# Patient Record
Sex: Male | Born: 1935 | Race: Black or African American | Hispanic: No | Smoking: Former smoker
Health system: Southern US, Community
[De-identification: ages and names within clinical notes are randomized; demographics above are authoritative.]

## PROBLEM LIST (undated history)

## (undated) DIAGNOSIS — M1990 Unspecified osteoarthritis, unspecified site: Secondary | ICD-10-CM

## (undated) DIAGNOSIS — F0390 Unspecified dementia without behavioral disturbance: Secondary | ICD-10-CM

## (undated) DIAGNOSIS — N189 Chronic kidney disease, unspecified: Secondary | ICD-10-CM

## (undated) DIAGNOSIS — R569 Unspecified convulsions: Secondary | ICD-10-CM

## (undated) DIAGNOSIS — E119 Type 2 diabetes mellitus without complications: Secondary | ICD-10-CM

## (undated) DIAGNOSIS — I10 Essential (primary) hypertension: Secondary | ICD-10-CM

## (undated) DIAGNOSIS — E7800 Pure hypercholesterolemia, unspecified: Secondary | ICD-10-CM

## (undated) DIAGNOSIS — I639 Cerebral infarction, unspecified: Secondary | ICD-10-CM

## (undated) DIAGNOSIS — I480 Paroxysmal atrial fibrillation: Secondary | ICD-10-CM

## (undated) HISTORY — PX: CORONARY ARTERY BYPASS GRAFT: SHX141

## (undated) HISTORY — PX: CATARACT EXTRACTION: SUR2

## (undated) HISTORY — PX: TONSILLECTOMY: SUR1361

---

## 2014-08-18 ENCOUNTER — Emergency Department (HOSPITAL_COMMUNITY): Payer: Medicare PPO

## 2014-08-18 ENCOUNTER — Inpatient Hospital Stay (HOSPITAL_COMMUNITY): Payer: Medicare PPO

## 2014-08-18 ENCOUNTER — Observation Stay (HOSPITAL_COMMUNITY)
Admission: EM | Admit: 2014-08-18 | Discharge: 2014-08-20 | Disposition: A | Discharge status: Nursing Home (Includes ICF) | Payer: Medicare PPO | Attending: Internal Medicine | Admitting: Internal Medicine

## 2014-08-18 ENCOUNTER — Encounter (HOSPITAL_COMMUNITY): Payer: Self-pay | Admitting: Emergency Medicine

## 2014-08-18 DIAGNOSIS — R739 Hyperglycemia, unspecified: Secondary | ICD-10-CM

## 2014-08-18 DIAGNOSIS — I129 Hypertensive chronic kidney disease with stage 1 through stage 4 chronic kidney disease, or unspecified chronic kidney disease: Secondary | ICD-10-CM | POA: Diagnosis not present

## 2014-08-18 DIAGNOSIS — J019 Acute sinusitis, unspecified: Secondary | ICD-10-CM | POA: Insufficient documentation

## 2014-08-18 DIAGNOSIS — R4781 Slurred speech: Secondary | ICD-10-CM | POA: Insufficient documentation

## 2014-08-18 DIAGNOSIS — F0390 Unspecified dementia without behavioral disturbance: Secondary | ICD-10-CM | POA: Diagnosis not present

## 2014-08-18 DIAGNOSIS — I10 Essential (primary) hypertension: Secondary | ICD-10-CM | POA: Diagnosis present

## 2014-08-18 DIAGNOSIS — Z794 Long term (current) use of insulin: Secondary | ICD-10-CM | POA: Diagnosis not present

## 2014-08-18 DIAGNOSIS — I2510 Atherosclerotic heart disease of native coronary artery without angina pectoris: Secondary | ICD-10-CM | POA: Diagnosis not present

## 2014-08-18 DIAGNOSIS — Z951 Presence of aortocoronary bypass graft: Secondary | ICD-10-CM | POA: Diagnosis not present

## 2014-08-18 DIAGNOSIS — E118 Type 2 diabetes mellitus with unspecified complications: Secondary | ICD-10-CM

## 2014-08-18 DIAGNOSIS — R4182 Altered mental status, unspecified: Secondary | ICD-10-CM | POA: Diagnosis present

## 2014-08-18 DIAGNOSIS — E785 Hyperlipidemia, unspecified: Secondary | ICD-10-CM | POA: Diagnosis present

## 2014-08-18 DIAGNOSIS — N183 Chronic kidney disease, stage 3 unspecified: Secondary | ICD-10-CM | POA: Diagnosis present

## 2014-08-18 DIAGNOSIS — F0150 Vascular dementia without behavioral disturbance: Secondary | ICD-10-CM | POA: Diagnosis present

## 2014-08-18 DIAGNOSIS — IMO0002 Reserved for concepts with insufficient information to code with codable children: Secondary | ICD-10-CM

## 2014-08-18 DIAGNOSIS — Z7982 Long term (current) use of aspirin: Secondary | ICD-10-CM | POA: Insufficient documentation

## 2014-08-18 DIAGNOSIS — E1165 Type 2 diabetes mellitus with hyperglycemia: Secondary | ICD-10-CM | POA: Diagnosis not present

## 2014-08-18 DIAGNOSIS — R55 Syncope and collapse: Secondary | ICD-10-CM | POA: Diagnosis not present

## 2014-08-18 DIAGNOSIS — G9340 Encephalopathy, unspecified: Secondary | ICD-10-CM

## 2014-08-18 HISTORY — DX: Unspecified dementia, unspecified severity, without behavioral disturbance, psychotic disturbance, mood disturbance, and anxiety: F03.90

## 2014-08-18 HISTORY — DX: Unspecified osteoarthritis, unspecified site: M19.90

## 2014-08-18 HISTORY — DX: Pure hypercholesterolemia, unspecified: E78.00

## 2014-08-18 HISTORY — DX: Type 2 diabetes mellitus without complications: E11.9

## 2014-08-18 HISTORY — DX: Chronic kidney disease, unspecified: N18.9

## 2014-08-18 HISTORY — DX: Essential (primary) hypertension: I10

## 2014-08-18 LAB — CBC WITH DIFFERENTIAL/PLATELET
Basophils Absolute: 0 10*3/uL (ref 0.0–0.1)
Basophils Relative: 0 % (ref 0–1)
EOS ABS: 0.1 10*3/uL (ref 0.0–0.7)
Eosinophils Relative: 1 % (ref 0–5)
HCT: 34.5 % — ABNORMAL LOW (ref 39.0–52.0)
HEMOGLOBIN: 11.8 g/dL — AB (ref 13.0–17.0)
Lymphocytes Relative: 15 % (ref 12–46)
Lymphs Abs: 0.7 10*3/uL (ref 0.7–4.0)
MCH: 30.7 pg (ref 26.0–34.0)
MCHC: 34.2 g/dL (ref 30.0–36.0)
MCV: 89.8 fL (ref 78.0–100.0)
MONO ABS: 0.3 10*3/uL (ref 0.1–1.0)
MONOS PCT: 6 % (ref 3–12)
NEUTROS ABS: 3.8 10*3/uL (ref 1.7–7.7)
NEUTROS PCT: 78 % — AB (ref 43–77)
Platelets: 208 10*3/uL (ref 150–400)
RBC: 3.84 MIL/uL — ABNORMAL LOW (ref 4.22–5.81)
RDW: 13.1 % (ref 11.5–15.5)
WBC: 4.9 10*3/uL (ref 4.0–10.5)

## 2014-08-18 LAB — GLUCOSE, CAPILLARY
Glucose-Capillary: 242 mg/dL — ABNORMAL HIGH (ref 70–99)
Glucose-Capillary: 343 mg/dL — ABNORMAL HIGH (ref 70–99)

## 2014-08-18 LAB — URINALYSIS, ROUTINE W REFLEX MICROSCOPIC
Bilirubin Urine: NEGATIVE
Glucose, UA: >1000 mg/dL — AB
Hgb urine dipstick: NEGATIVE
Ketones, ur: NEGATIVE mg/dL
LEUKOCYTES UA: NEGATIVE
NITRITE: NEGATIVE
PROTEIN: NEGATIVE mg/dL
SPECIFIC GRAVITY, URINE: 1.022 (ref 1.005–1.030)
UROBILINOGEN UA: 0.2 mg/dL (ref 0.0–1.0)
pH: 7 (ref 5.0–8.0)

## 2014-08-18 LAB — CBG MONITORING, ED
GLUCOSE-CAPILLARY: 331 mg/dL — AB (ref 70–99)
GLUCOSE-CAPILLARY: 302 mg/dL — AB (ref 70–99)
Glucose-Capillary: 291 mg/dL — ABNORMAL HIGH (ref 70–99)

## 2014-08-18 LAB — COMPREHENSIVE METABOLIC PANEL
ALT: 21 U/L (ref 0–53)
ANION GAP: 13 (ref 5–15)
AST: 19 U/L (ref 0–37)
Albumin: 3.6 g/dL (ref 3.5–5.2)
Alkaline Phosphatase: 63 U/L (ref 39–117)
BUN: 24 mg/dL — AB (ref 6–23)
CHLORIDE: 95 meq/L — AB (ref 96–112)
CO2: 27 mEq/L (ref 19–32)
CREATININE: 1.52 mg/dL — AB (ref 0.50–1.35)
Calcium: 9.5 mg/dL (ref 8.4–10.5)
GFR calc Af Amer: 48 mL/min — ABNORMAL LOW (ref >90)
GFR calc non Af Amer: 42 mL/min — ABNORMAL LOW (ref >90)
Glucose, Bld: 476 mg/dL — ABNORMAL HIGH (ref 70–99)
Potassium: 4.6 mEq/L (ref 3.7–5.3)
Sodium: 135 mEq/L — ABNORMAL LOW (ref 137–147)
TOTAL PROTEIN: 7.3 g/dL (ref 6.0–8.3)
Total Bilirubin: 0.3 mg/dL (ref 0.3–1.2)

## 2014-08-18 LAB — URINE MICROSCOPIC-ADD ON

## 2014-08-18 LAB — TROPONIN I: Troponin I: <0.3 ng/mL (ref <0.30)

## 2014-08-18 LAB — I-STAT TROPONIN, ED: TROPONIN I, POC: 0.01 ng/mL (ref 0.00–0.08)

## 2014-08-18 MED ORDER — ASPIRIN EC 325 MG PO TBEC
325.0000 mg | DELAYED_RELEASE_TABLET | Freq: Every day | ORAL | Status: DC
  Administered 2014-08-19 – 2014-08-20 (×2): 325 mg via ORAL
  Filled 2014-08-18 (×2): qty 1

## 2014-08-18 MED ORDER — HEPARIN SODIUM (PORCINE) 5000 UNIT/ML IJ SOLN
5000.0000 [IU] | Freq: Three times a day (TID) | INTRAMUSCULAR | Status: DC
  Administered 2014-08-18 – 2014-08-20 (×6): 5000 [IU] via SUBCUTANEOUS
  Filled 2014-08-18 (×6): qty 1

## 2014-08-18 MED ORDER — SODIUM CHLORIDE 0.9 % IV BOLUS (SEPSIS)
1000.0000 mL | Freq: Once | INTRAVENOUS | Status: AC
  Administered 2014-08-18: 1000 mL via INTRAVENOUS

## 2014-08-18 MED ORDER — ONDANSETRON HCL 4 MG PO TABS: 4.0000 mg | ORAL_TABLET | Freq: Four times a day (QID) | ORAL | Status: DC | PRN

## 2014-08-18 MED ORDER — DOCUSATE SODIUM 100 MG PO CAPS
100.0000 mg | ORAL_CAPSULE | Freq: Two times a day (BID) | ORAL | Status: DC
  Administered 2014-08-18 – 2014-08-20 (×4): 100 mg via ORAL
  Filled 2014-08-18 (×4): qty 1

## 2014-08-18 MED ORDER — SODIUM CHLORIDE 0.9 % IJ SOLN
3.0000 mL | Freq: Two times a day (BID) | INTRAMUSCULAR | Status: DC
  Administered 2014-08-18 – 2014-08-19 (×2): 3 mL via INTRAVENOUS

## 2014-08-18 MED ORDER — ACETAMINOPHEN 325 MG PO TABS: 650.0000 mg | ORAL_TABLET | Freq: Four times a day (QID) | ORAL | Status: DC | PRN

## 2014-08-18 MED ORDER — INSULIN ASPART 100 UNIT/ML ~~LOC~~ SOLN
0.0000 [IU] | Freq: Three times a day (TID) | SUBCUTANEOUS | Status: DC
  Administered 2014-08-19 (×2): 7 [IU] via SUBCUTANEOUS
  Administered 2014-08-19 – 2014-08-20 (×2): 5 [IU] via SUBCUTANEOUS
  Administered 2014-08-20 (×2): 3 [IU] via SUBCUTANEOUS

## 2014-08-18 MED ORDER — AMOXICILLIN-POT CLAVULANATE 500-125 MG PO TABS
1.0000 | ORAL_TABLET | Freq: Three times a day (TID) | ORAL | Status: DC
  Administered 2014-08-18 – 2014-08-20 (×6): 500 mg via ORAL
  Filled 2014-08-18 (×7): qty 1

## 2014-08-18 MED ORDER — ATORVASTATIN CALCIUM 10 MG PO TABS
10.0000 mg | ORAL_TABLET | Freq: Every day | ORAL | Status: DC
Start: 2014-08-18 — End: 2014-08-20
  Administered 2014-08-18 – 2014-08-20 (×3): 10 mg via ORAL
  Filled 2014-08-18 (×3): qty 1

## 2014-08-18 MED ORDER — TRAZODONE HCL 50 MG PO TABS: 25.0000 mg | ORAL_TABLET | Freq: Every evening | ORAL | Status: DC | PRN

## 2014-08-18 MED ORDER — SODIUM CHLORIDE 0.9 % IV SOLN
INTRAVENOUS | Status: DC
  Administered 2014-08-18: 2.7 [IU]/h via INTRAVENOUS
  Filled 2014-08-18: qty 2.5

## 2014-08-18 MED ORDER — ONDANSETRON HCL 4 MG/2ML IJ SOLN
4.0000 mg | Freq: Four times a day (QID) | INTRAMUSCULAR | Status: DC | PRN
Start: 2014-08-18 — End: 2014-08-20

## 2014-08-18 MED ORDER — ACETAMINOPHEN 650 MG RE SUPP: 650.0000 mg | Freq: Four times a day (QID) | RECTAL | Status: DC | PRN

## 2014-08-18 MED ORDER — SODIUM CHLORIDE 0.9 % IV SOLN: INTRAVENOUS | Status: DC

## 2014-08-18 NOTE — Consult Note (Signed)
NEURO HOSPITALIST CONSULT NOTE    Reason for Consult: AMS after presyncopal peroid  HPI:                                                                                                                                          Nosson Wender is an 80 y.o. male  who resides at a nursing facility.  History of events this AM were obtained from chart as patient cannot recall what happened. Per nursing staff at SNF " she states that patient was sitting on his chair, started sluring speech, then drooling on himself and then began going in and out of consciousness - this lasted 15-20 minutes until EMS came, he was still not back to his baseline. States at baseline he walks and talks, is usually oriented x 3. Was started on seroquel in in the past two weeks, no other medication changes. States his behavior was changed today, he was giddy and was walking with his walker backwards prior to the event - states this is unlike him. "  Currently he is alert and oriented to place but not year or month.  He recalls going to get breakfast but cannot recall events after.  He is able to follow all commands.  He has urinated on himself but clearly states he called out to the nurse stating he needed to urinate but no one came to help.    Past Medical History  Diagnosis Date  . Dementia   . Elevated cholesterol   . Hypertension   . Diabetes mellitus without complication     Past Surgical History  Procedure Laterality Date  . Cardiac surgery      Family History: Unable to obatin  Social History:  reports that he has never smoked. He does not have any smokeless tobacco history on file. He reports that he does not drink alcohol. His drug history is not on file.  No Known Allergies  MEDICATIONS:                                                                                                                     Current Facility-Administered Medications  Medication Dose Route Frequency  Provider Last Rate Last Dose  . insulin regular (NOVOLIN R,HUMULIN R) 250 Units in sodium chloride 0.9 %  250 mL (1 Units/mL) infusion   Intravenous Continuous Trixie DredgeEmily West, PA-C 2.7 mL/hr at 08/18/14 1454 2.7 Units/hr at 08/18/14 1454   Current Outpatient Prescriptions  Medication Sig Dispense Refill  . amLODipine (NORVASC) 10 MG tablet Take 10 mg by mouth daily.      [name]. aspirin EC 325 MG tablet Take 325 mg by mouth daily.      [name]. atorvastatin (LIPITOR) 10 MG tablet Take 10 mg by mouth daily.      . Cholecalciferol (VITAMIN D-3) 1000 UNITS CAPS Take 1,000 Units by mouth daily.      [name]. glimepiride (AMARYL) 2 MG tablet Take 2 mg by mouth 2 (two) times daily.      . hydrochlorothiazide (HYDRODIURIL) 25 MG tablet Take 25 mg by mouth daily.      [name]. lisinopril (PRINIVIL,ZESTRIL) 10 MG tablet Take 10 mg by mouth daily.      . pioglitazone (ACTOS) 30 MG tablet Take 30 mg by mouth daily.      . QUEtiapine (SEROQUEL) 50 MG tablet Take 50 mg by mouth 2 (two) times daily.      . sitaGLIPtin (JANUVIA) 50 MG tablet Take 50 mg by mouth daily.          ROS:                                                                                                                                       History obtained from unobtainable from patient due to mental status   Blood pressure 144/68, pulse 73, temperature 97.6 F (36.4 C), temperature source Oral, resp. rate 15, SpO2 100.00%.   Neurologic Examination:                                                                                                       General: NAD Mental Status: Alert, oriented to hospital but not year or month.  Amnestic of events that occurred this AM.  Speech now fluent without evidence of aphasia.  Able to follow 3 step commands without difficulty. Cranial Nerves: II: Discs flat bilaterally; Visual fields grossly normal, pupils equal, round, reactive to light and accommodation III,IV, VI: ptosis not present, extra-ocular motions intact  bilaterally V,VII: smile symmetric, facial light touch sensation normal bilaterally VIII: hearing normal bilaterally IX,X: gag reflex present XI: bilateral shoulder shrug XII: midline tongue extension without atrophy or fasciculations  Motor: Right : Upper extremity   5/5    Left:     Upper extremity  5/5  Lower extremity   5/5     Lower extremity   5/5 Tone and bulk:normal tone throughout; no atrophy noted Sensory: Pinprick and light touch intact throughout, bilaterally Deep Tendon Reflexes:  Right: Upper Extremity   Left: Upper extremity   biceps (C-5 to C-6) 2/4   biceps (C-5 to C-6) 2/4 tricep (C7) 2/4    triceps (C7) 2/4 Brachioradialis (C6) 2/4  Brachioradialis (C6) 2/4  Lower Extremity Lower Extremity  quadriceps (L-2 to L-4) 2/4   quadriceps (L-2 to L-4) 2/4 Achilles (S1) 2/4   Achilles (S1) 2/4  Plantars: Right: downgoing   Left: downgoing Cerebellar: normal finger-to-nose,  normal heel-to-shin test Gait: not tested due to multiple leads.  CV: pulses palpable throughout   Lab Results: Basic Metabolic Panel:  Recent Labs Lab 08/18/14 1152  NA 135*  K 4.6  CL 95*  CO2 27  GLUCOSE 476*  BUN 24*  CREATININE 1.52*  CALCIUM 9.5    Liver Function Tests:  Recent Labs Lab 08/18/14 1152  AST 19  ALT 21  ALKPHOS 63  BILITOT 0.3  PROT 7.3  ALBUMIN 3.6   No results found for this basename: LIPASE, AMYLASE,  in the last 168 hours No results found for this basename: AMMONIA,  in the last 168 hours  CBC:  Recent Labs Lab 08/18/14 1152  WBC 4.9  NEUTROABS 3.8  HGB 11.8*  HCT 34.5*  MCV 89.8  PLT 208    Cardiac Enzymes: No results found for this basename: CKTOTAL, CKMB, CKMBINDEX, TROPONINI,  in the last 168 hours  Lipid Panel: No results found for this basename: CHOL, TRIG, HDL, CHOLHDL, VLDL, LDLCALC,  in the last 168 hours  CBG:  Recent Labs Lab 08/18/14 1442  GLUCAP 331*    Microbiology: No results found for this or any previous  visit.  Coagulation Studies: No results found for this basename: LABPROT, INR,  in the last 72 hours  Imaging: Dg Chest 2 View  08/18/2014   CLINICAL DATA:  Syncope.  EXAM: CHEST  2 VIEW  COMPARISON:  None.  FINDINGS: The heart size and mediastinal contours are within normal limits. Both lungs are clear. No pneumothorax or pleural effusion is noted. Status post coronary artery bypass graft. Degenerative changes of lower lumbar spine are noted.  IMPRESSION: No acute cardiopulmonary abnormality seen.   Electronically Signed   By: Roque LiasJames  Green M.D.   On: 08/18/2014 12:39   Ct Head Wo Contrast  08/18/2014   CLINICAL DATA:  Near syncope.  Slurred speech  EXAM: CT HEAD WITHOUT CONTRAST  TECHNIQUE: Contiguous axial images were obtained from the base of the skull through the vertex without intravenous contrast.  COMPARISON:  None.  FINDINGS: Generalized atrophy.  Negative for acute infarct.  Negative for hemorrhage or mass lesion.  Chronic sinusitis with mucosal thickening in the left sphenoid and ethmoid sinus. Mild mucosal edema in the maxillary sinus bilaterally. No acute bony abnormality.  IMPRESSION: Generalized atrophy.  No acute abnormality  Chronic sinusitis.   Electronically Signed   By: Marlan Palauharles  Clark M.D.   On: 08/18/2014 12:45       Assessment and plan per attending neurologist  Felicie Mornavid Smith PA-C [facility] [phone]  08/18/2014, 3:26 PM   Assessment/Plan: [age] Male who was noted to have a change in mental status this am that lasted for about 15-20 minutes which included confusion and waxing and waning level of consciousness followed by amnestic period of event. As patient does have known dementia  and arrived with significantly elevated CBG cannot exclude seizure and would also like to obtain MRI brain to evaluate for possible stroke. Would not do full stroke work up unless MRI is positive for CVA.  Recommend: 1) MRI brain without contrast 2) EEG  Will continue to  follow.    Patient seen and examined together with physician assistant and I concur with the assessment and plan.  Wyatt Portela, MD

## 2014-08-18 NOTE — Progress Notes (Signed)
Patient arrived to unit via stretcher from ER. Patient alert and oriented to self. Family at bedside, oriented and educated on unit protocol. Patient placed on Cardiac monitoring and called in to central monitoring, call bell within reach, bed alarm activated. Patient resting and no sign of distress noted. RN will continue to monitor. Marin RobertsAisha Ardis Lawley RN

## 2014-08-18 NOTE — ED Notes (Signed)
Per EMS- pt was noted by staff at nursing facility to have slurred speech and slumping in chair at 920. Pt was lowered to the floor and had a near syncopal episode. Pt was noted by ems to open eyes on command. Pt with no slurred speech or neuro deficits with EMS. Pt alert and oriented x1 with hx of memory loss. Pt reported feeling tired and dizzy.

## 2014-08-18 NOTE — ED Notes (Signed)
PA at bedside.

## 2014-08-18 NOTE — ED Notes (Signed)
Fluids not finished yet. Will start glucostabilizer when finished.

## 2014-08-18 NOTE — ED Notes (Signed)
Phlebotomy at bedside.

## 2014-08-18 NOTE — ED Notes (Signed)
EKG performed on portable for monitor in room is not working; Crecencio McNikki S, RN aware

## 2014-08-18 NOTE — H&P (Signed)
Date: 08/18/2014               Patient Name:  Todd [name] MRN: [id number]  DOB: 31-Oct-1931 Age / Sex: 80 y.o., male   PCP: Marletta LorJulie Barr, NP         Medical Service: Internal Medicine Teaching Service         Attending Physician: Dr. Earl LagosNischal Narendra, MD    First Contact: Dr. Rich Numberarly Kveon Casanas Pager: [phone]  Second Contact: Dr. Evelena PeatAlex Wilson Pager: [phone]       After Hours (After 5p/  First Contact Pager: [phone]  weekends / holidays): Second Contact Pager: [phone]   Chief Complaint: Syncope  History of Present Illness: Todd [name] is a 80yo man, resident at a nursing home, w/ PMHx of dementia, HTN, HLD, CKD Stage III, Type 2 DM, and CAD s/p CABG who presented to the ED after a syncopal episode. Patient cannot recall what happened and therefore history was obtained through family members and medical records. Per records, patient was sitting in his chair, started to have slurred speech, began drooling on himself, and then was in and out of consciousness. This lasted approximately 20 minutes. Staff stated that he slumped down in his chair. He was brought to the hospital by EMS. Per family, patient is able to walk on his own and very talkative. Family notes patient was started on Seroquel two weeks ago. Family states he had an episode of syncope 3.5 years ago which was worked up and "everything came back normal." Family reports he had monitoring of his heart (Holter monitor?) which was normal as well. Family denies any previous history of stroke. Patient notes some weakness in his legs, but does not seem to be new. Patient denies fever, chills, chest pain, abdominal pain, nausea, and dysuria.   In the ED, patient was evaluated by neuro. He received a CT Head which was negative. MRI Brain showed no evidence for acute infarct but did show acute sinusitis. He was also found to be hyperglycemic with blood glucose of 476.  Meds: Current Facility-Administered Medications  Medication Dose Route Frequency  Provider Last Rate Last Dose  . acetaminophen (TYLENOL) tablet 650 mg  650 mg Oral Q6H PRN Ky BarbanSolianny D Kennerly, MD       Or  . acetaminophen (TYLENOL) suppository 650 mg  650 mg Rectal Q6H PRN Ky BarbanSolianny D Kennerly, MD      . Melene Muller[START ON 08/19/2014] aspirin EC tablet 325 mg  325 mg Oral Daily Ky BarbanSolianny D Kennerly, MD      . atorvastatin (LIPITOR) tablet 10 mg  10 mg Oral q1800 Ky BarbanSolianny D Kennerly, MD      . docusate sodium (COLACE) capsule 100 mg  100 mg Oral BID Ky BarbanSolianny D Kennerly, MD      . heparin injection 5,000 Units  5,000 Units Subcutaneous 3 times per day Ky BarbanSolianny D Kennerly, MD      . Melene Muller[START ON 08/19/2014] insulin aspart (novoLOG) injection 0-9 Units  0-9 Units Subcutaneous TID WC Ky BarbanSolianny D Kennerly, MD      . ondansetron (ZOFRAN) tablet 4 mg  4 mg Oral Q6H PRN Ky BarbanSolianny D Kennerly, MD       Or  . ondansetron (ZOFRAN) injection 4 mg  4 mg Intravenous Q6H PRN Ky BarbanSolianny D Kennerly, MD      . sodium chloride 0.9 % injection 3 mL  3 mL Intravenous Q12H Ky BarbanSolianny D Kennerly, MD      . traZODone (DESYREL) tablet 25 mg  25 mg Oral  QHS PRN Ky Barban, MD        Allergies: Allergies as of 08/18/2014  . (No Known Allergies)   Past Medical History  Diagnosis Date  . Elevated cholesterol   . Hypertension   . Type II diabetes mellitus   . Dementia     "don't know stage or type" (08/18/2014)  . Arthritis     "probably"  . Chronic kidney disease     "? stage" (08/18/2014)   Past Surgical History  Procedure Laterality Date  . Tonsillectomy    . Coronary artery bypass graft  ?1994    "CABG X3"  . Cataract extraction     History reviewed. No pertinent family history. History   Social History  . Marital Status: Widowed    Spouse Name: N/A    Number of Children: N/A  . Years of Education: N/A   Occupational History  . Not on file.   Social History Main Topics  . Smoking status: Former Smoker    Types: Pipe  . Smokeless tobacco: Never Used     Comment: "quit smoking a  pipe in the 80's"  . Alcohol Use: No  . Drug Use: No  . Sexual Activity: Not on file   Other Topics Concern  . Not on file   Social History Narrative  . No narrative on file    Review of Systems: General: Denies night sweats, changes in weight, changes in appetite HEENT: Denies headaches, ear pain, changes in vision, rhinorrhea, sore throat CV: Denies palpitations, SOB, orthopnea Pulm: Denies SOB, cough, wheezing GI: Denies vomiting, diarrhea, constipation, melena, hematochezia GU: Denies hematuria, frequency Msk: Denies muscle cramps, joint pains Neuro: Denies numbness, tingling Skin: Denies rashes, bruising  Physical Exam: Blood pressure 144/64, pulse 77, temperature 98.4 F (36.9 C), temperature source Oral, resp. rate 18, SpO2 100.00%. General: alert, sitting up in bed, NAD HEENT: Celebration/AT, EOMI, PERRL, sclera anicteric, mucus membranes moist CV: RRR, normal S1/S2, 2/6 systolic murmur heard best at right sternal border Pulm: CTA bilaterally, breaths non-labored Abd: BS+, soft, non-distended, non-tender Ext: warm, moves all, no edema. Lower extremities have very dry skin.  Neuro: alert and oriented to person and hospital, speech fluent, CNs II-XII intact, strength 5/5 in upper and lower extremities bilaterally Skin: Midline chest scar  Lab results: Basic Metabolic Panel:  Recent Labs  40/98/11 1152  NA 135*  K 4.6  CL 95*  CO2 27  GLUCOSE 476*  BUN 24*  CREATININE 1.52*  CALCIUM 9.5   Liver Function Tests:  Recent Labs  08/18/14 1152  AST 19  ALT 21  ALKPHOS 63  BILITOT 0.3  PROT 7.3  ALBUMIN 3.6   No results found for this basename: LIPASE, AMYLASE,  in the last 72 hours No results found for this basename: AMMONIA,  in the last 72 hours CBC:  Recent Labs  08/18/14 1152  WBC 4.9  NEUTROABS 3.8  HGB 11.8*  HCT 34.5*  MCV 89.8  PLT 208   Cardiac Enzymes: No results found for this basename: CKTOTAL, CKMB, CKMBINDEX, TROPONINI,  in the last 72  hours BNP: No results found for this basename: PROBNP,  in the last 72 hours D-Dimer: No results found for this basename: DDIMER,  in the last 72 hours CBG:  Recent Labs  08/18/14 1442 08/18/14 1603 08/18/14 1618 08/18/14 1709  GLUCAP 331* 302* 291* 242*   Hemoglobin A1C: No results found for this basename: HGBA1C,  in the last 72 hours Fasting Lipid Panel:  No results found for this basename: CHOL, HDL, LDLCALC, TRIG, CHOLHDL, LDLDIRECT,  in the last 72 hours Thyroid Function Tests: No results found for this basename: TSH, T4TOTAL, FREET4, T3FREE, THYROIDAB,  in the last 72 hours Anemia Panel: No results found for this basename: VITAMINB12, FOLATE, FERRITIN, TIBC, IRON, RETICCTPCT,  in the last 72 hours Coagulation: No results found for this basename: LABPROT, INR,  in the last 72 hours Urine Drug Screen: Drugs of Abuse  No results found for this basename: labopia, cocainscrnur, labbenz, amphetmu, thcu, labbarb    Alcohol Level: No results found for this basename: ETH,  in the last 72 hours Urinalysis:  Recent Labs  08/18/14 1550  COLORURINE YELLOW  LABSPEC 1.022  PHURINE 7.0  GLUCOSEU >1000*  HGBUR NEGATIVE  BILIRUBINUR NEGATIVE  KETONESUR NEGATIVE  PROTEINUR NEGATIVE  UROBILINOGEN 0.2  NITRITE NEGATIVE  LEUKOCYTESUR NEGATIVE    Imaging results:  Dg Chest 2 View  08/18/2014   CLINICAL DATA:  Syncope.  EXAM: CHEST  2 VIEW  COMPARISON:  None.  FINDINGS: The heart size and mediastinal contours are within normal limits. Both lungs are clear. No pneumothorax or pleural effusion is noted. Status post coronary artery bypass graft. Degenerative changes of lower lumbar spine are noted.  IMPRESSION: No acute cardiopulmonary abnormality seen.   Electronically Signed   By: Roque LiasJames  Green M.D.   On: 08/18/2014 12:39   Ct Head Wo Contrast  08/18/2014   CLINICAL DATA:  Near syncope.  Slurred speech  EXAM: CT HEAD WITHOUT CONTRAST  TECHNIQUE: Contiguous axial images were  obtained from the base of the skull through the vertex without intravenous contrast.  COMPARISON:  None.  FINDINGS: Generalized atrophy.  Negative for acute infarct.  Negative for hemorrhage or mass lesion.  Chronic sinusitis with mucosal thickening in the left sphenoid and ethmoid sinus. Mild mucosal edema in the maxillary sinus bilaterally. No acute bony abnormality.  IMPRESSION: Generalized atrophy.  No acute abnormality  Chronic sinusitis.   Electronically Signed   By: Marlan Palauharles  Clark M.D.   On: 08/18/2014 12:45    Other results: EKG: sinus rhythm, possible anterior infarct (old?), no previous to compare  Assessment & Plan: Todd [name] is a 80yo man, resident at a nursing home, w/ PMHx of dementia, HTN, HLD, Type 2 DM, and CAD s/p CABG who presented to the ED after a syncopal episode.   1. Syncope?: Patient presented to the ED after having a presumed syncopal episode at the nursing home. Per history, it is difficult to assess whether the patient had a true episode of syncope. History of "drooling on himself" and "going in and out consciousness" more consistent with seizure vs. CVA. Patient did have a syncopal event 3.5 years ago that was extensively worked up with no abnormalities found. CT Head and MRI Brain negative for acute stroke. Possible that patient's brief altered mental status due to acute sinusitis found on MRI. He was alert and oriented x 2 on exam. Neurology ordered EEG, will f/u results. Will work up for syncope. - Neurology on board, appreciate recommendations - f/u EEG - Echocardiogram - Repeat EKG in AM - Troponin - Cardiac monitoring  2. Acute Sinusitis: Patient found to have acute sinusitis on MRI. Patient denied fever, chills, rhinorrhea, congestion, cough. WBC count 4.9. Afebrile on exam.  - Start Augmentin  3. HTN: BP 144/68 on admission. Patient takes Norvasc 10 mg daily, HCTZ 25 mg daily, and Lisinopril 10 mg daily at home. - Hold home BP meds for  now, likely restart  gradually in morning  4. Type 2 DM: Blood glucose 476 on admission. CBG 331. Patient takes Glimepride 2 mg BID, Pioglitazone 30 mg daily, and Januvia 50 mg daily at home. Do not know patient's baseline blood glucose control. - Check HbA1c - CBGs 4 times daily - Sensitive ISS  5. Hx CAD s/p CABG: No records. Per family patient has had a CABG. Patient has midline chest scar. He takes aspirin 325 mg daily and Lipitor 10 mg daily at home. - Continue ASA 325 mg - Continue Lipitor  6. HLD: No lipid profile on file. Patient coming from [place], [place]. On Atorvastatin 10 mg daily at home.  - Continue Atorvastatin  7. CKD Stage III: Per family, patient has history of CKD. Labs showed Cr 1.52 and GFR 48. Do not have baseline Cr. - Continue to monitor - bmet in AM  8. Dementia: Per family, patient has history of dementia and is in nursing home. He is normally able to walk and talk without any difficulties. Will continue to monitor.   Diet: DYS 3 DVT/PE PPx: Heparin SQ Dispo: Disposition is deferred at this time, awaiting improvement of current medical problems. Anticipated discharge in approximately 1-2 day(s).   The patient does have a current PCP Marletta Lor, NP) and does need an [facility] hospital follow-up appointment after discharge.  The patient does not have transportation limitations that hinder transportation to clinic appointments.  Signed: Rich Number, MD 08/18/2014, 6:13 PM

## 2014-08-18 NOTE — ED Notes (Signed)
Pt resting, no needs at this time.

## 2014-08-18 NOTE — ED Provider Notes (Signed)
CSN: [id number]     Arrival date & time 08/18/14  1031 History   First MD Initiated Contact with Patient 08/18/14 1037     Chief Complaint  Patient presents with  . Near Syncope     (Consider location/radiation/quality/duration/timing/severity/associated sxs/prior Treatment) HPI  Pt with hx dementia, HTN, HL, DM, brought to ED after episode of slurred speech and slumping in his chair.  He was lowered to the ground.  Pt denies dizziness/lightheadedness, pain anywhere, SOB, nausea.  No complaints at this time.  States he feels fine.  He is not able to tell me anything that happened this morning except that he went to breakfast.  Level V caveat for dementia.   Past Medical History  Diagnosis Date  . Dementia   . Elevated cholesterol   . Hypertension   . Diabetes mellitus without complication    Past Surgical History  Procedure Laterality Date  . Cardiac surgery     No family history on file. History  Substance Use Topics  . Smoking status: Never Smoker   . Smokeless tobacco: Not on file  . Alcohol Use: No    Review of Systems  Unable to perform ROS: Dementia      Allergies  Review of patient's allergies indicates no known allergies.  Home Medications   Prior to Admission medications   Not on File   SpO2 96% Physical Exam  Nursing note and vitals reviewed. Constitutional: He appears well-developed and well-nourished. No distress.  HENT:  Head: Normocephalic and atraumatic.  Eyes: EOM are normal.  Neck: Normal range of motion. Neck supple.  Cardiovascular: Normal rate and regular rhythm.   Pulmonary/Chest: Effort normal and breath sounds normal. No respiratory distress. He has no wheezes. He has no rales.  Abdominal: Soft. He exhibits no distension and no mass. There is no tenderness. There is no rebound and no guarding.  Musculoskeletal: He exhibits no edema.  Neurological: He is alert. He has normal strength. No cranial nerve deficit or sensory deficit. He  exhibits normal muscle tone. GCS eye subscore is 4. GCS verbal subscore is 5. GCS motor subscore is 6.  Oriented x 1  Skin: He is not diaphoretic.    ED Course  Procedures (including critical care time) Labs Review Labs Reviewed  CBC WITH DIFFERENTIAL - Abnormal; Notable for the following:    RBC 3.84 (*)    Hemoglobin 11.8 (*)    HCT 34.5 (*)    Neutrophils Relative % 78 (*)    All other components within normal limits  COMPREHENSIVE METABOLIC PANEL - Abnormal; Notable for the following:    Sodium 135 (*)    Chloride 95 (*)    Glucose, Bld 476 (*)    BUN 24 (*)    Creatinine, Ser 1.52 (*)    GFR calc non Af Amer 42 (*)    GFR calc Af Amer 48 (*)    All other components within normal limits  CBG MONITORING, ED - Abnormal; Notable for the following:    Glucose-Capillary 331 (*)    All other components within normal limits  URINALYSIS, ROUTINE W REFLEX MICROSCOPIC  I-STAT TROPOININ, ED    Imaging Review Dg Chest 2 View  08/18/2014   CLINICAL DATA:  Syncope.  EXAM: CHEST  2 VIEW  COMPARISON:  None.  FINDINGS: The heart size and mediastinal contours are within normal limits. Both lungs are clear. No pneumothorax or pleural effusion is noted. Status post coronary artery bypass graft. Degenerative changes of lower  lumbar spine are noted.  IMPRESSION: No acute cardiopulmonary abnormality seen.   Electronically Signed   By: Roque LiasJames  Green M.D.   On: 08/18/2014 12:39   Ct Head Wo Contrast  08/18/2014   CLINICAL DATA:  Near syncope.  Slurred speech  EXAM: CT HEAD WITHOUT CONTRAST  TECHNIQUE: Contiguous axial images were obtained from the base of the skull through the vertex without intravenous contrast.  COMPARISON:  None.  FINDINGS: Generalized atrophy.  Negative for acute infarct.  Negative for hemorrhage or mass lesion.  Chronic sinusitis with mucosal thickening in the left sphenoid and ethmoid sinus. Mild mucosal edema in the maxillary sinus bilaterally. No acute bony abnormality.   IMPRESSION: Generalized atrophy.  No acute abnormality  Chronic sinusitis.   Electronically Signed   By: Marlan Palauharles  Clark M.D.   On: 08/18/2014 12:45     EKG Interpretation   Date/Time:  Wednesday August 18 2014 11:21:07 EDT Ventricular Rate:  75 PR Interval:  164 QRS Duration: 90 QT Interval:  418 QTC Calculation: 466 R Axis:   -20 Text Interpretation:  Normal sinus rhythm Anterior infarct , age  undetermined Abnormal ECG No old ekg to compare Confirmed by NANAVATI, MD,  Janey GentaANKIT [phone]([id number]) on 08/18/2014 11:40:34 AM      Filed Vitals:   08/18/14 1044  BP: 131/64  Pulse: 79  Temp: 97.6 F (36.4 C)  Resp: 16     2:34 PM I spoke with staff member at patient's facility who was with him.  She states that patient was sitting on his chair, started sluring speech, then drooling on himself and then began going in and out of consciousness - this lasted 15-20 minutes until EMS came, he was still not back to his baseline.  States at baseline he walks and talks, is usually oriented x 3.  Was started on seroquel in in the past two weeks, no other medication changes.  States his behavior was changed today, he was giddy and was walking with his walker backwards prior to the event - states this is unlike him.    3:14 PM Dr Leroy Kennedyamilo will see and evaluate pt in consult.    3:20 PM Admitted to Internal Medicine Teaching Service.   MDM   Final diagnoses:  Syncope and collapse  Encephalopathy acute  Hyperglycemia    Patient  With hx dementia (baseline oriented x 3 per staff), HTN, HL, DM, p/w slurred speech 9:15am followed by syncope.  Now oriented x 1.  Nonfocal neuro exam - no deficits noted on physical exam.  Found to be hyperglycemic, on glucose stabilizer protocol.  Consult to be done by neuro hospitalist.  [facility].      Trixie Dredgemily Venson Ferencz, PA-C 08/18/14 1523

## 2014-08-19 ENCOUNTER — Encounter (HOSPITAL_COMMUNITY): Payer: Self-pay | Admitting: General Practice

## 2014-08-19 ENCOUNTER — Inpatient Hospital Stay (HOSPITAL_COMMUNITY): Payer: Medicare PPO

## 2014-08-19 DIAGNOSIS — F0390 Unspecified dementia without behavioral disturbance: Secondary | ICD-10-CM | POA: Diagnosis not present

## 2014-08-19 DIAGNOSIS — J019 Acute sinusitis, unspecified: Secondary | ICD-10-CM

## 2014-08-19 DIAGNOSIS — R55 Syncope and collapse: Secondary | ICD-10-CM | POA: Diagnosis not present

## 2014-08-19 DIAGNOSIS — E785 Hyperlipidemia, unspecified: Secondary | ICD-10-CM | POA: Diagnosis not present

## 2014-08-19 DIAGNOSIS — I059 Rheumatic mitral valve disease, unspecified: Secondary | ICD-10-CM

## 2014-08-19 DIAGNOSIS — E1165 Type 2 diabetes mellitus with hyperglycemia: Secondary | ICD-10-CM | POA: Diagnosis not present

## 2014-08-19 DIAGNOSIS — E119 Type 2 diabetes mellitus without complications: Secondary | ICD-10-CM

## 2014-08-19 DIAGNOSIS — N189 Chronic kidney disease, unspecified: Secondary | ICD-10-CM

## 2014-08-19 DIAGNOSIS — I10 Essential (primary) hypertension: Secondary | ICD-10-CM

## 2014-08-19 LAB — GLUCOSE, CAPILLARY
GLUCOSE-CAPILLARY: 401 mg/dL — AB (ref 70–99)
GLUCOSE-CAPILLARY: 231 mg/dL — AB (ref 70–99)
Glucose-Capillary: 287 mg/dL — ABNORMAL HIGH (ref 70–99)
Glucose-Capillary: 331 mg/dL — ABNORMAL HIGH (ref 70–99)

## 2014-08-19 LAB — HEMOGLOBIN A1C
Hgb A1c MFr Bld: 13 % — ABNORMAL HIGH (ref <5.7)
Mean Plasma Glucose: 326 mg/dL — ABNORMAL HIGH (ref <117)

## 2014-08-19 LAB — CBC
HCT: 31.8 % — ABNORMAL LOW (ref 39.0–52.0)
Hemoglobin: 11 g/dL — ABNORMAL LOW (ref 13.0–17.0)
MCH: 31.1 pg (ref 26.0–34.0)
MCHC: 34.6 g/dL (ref 30.0–36.0)
MCV: 89.8 fL (ref 78.0–100.0)
PLATELETS: 203 10*3/uL (ref 150–400)
RBC: 3.54 MIL/uL — ABNORMAL LOW (ref 4.22–5.81)
RDW: 13 % (ref 11.5–15.5)
WBC: 5.6 10*3/uL (ref 4.0–10.5)

## 2014-08-19 LAB — BASIC METABOLIC PANEL
ANION GAP: 12 (ref 5–15)
BUN: 22 mg/dL (ref 6–23)
CALCIUM: 9.4 mg/dL (ref 8.4–10.5)
CO2: 24 mEq/L (ref 19–32)
Chloride: 99 mEq/L (ref 96–112)
Creatinine, Ser: 1.36 mg/dL — ABNORMAL HIGH (ref 0.50–1.35)
GFR, EST AFRICAN AMERICAN: 55 mL/min — AB (ref >90)
GFR, EST NON AFRICAN AMERICAN: 48 mL/min — AB (ref >90)
Glucose, Bld: 297 mg/dL — ABNORMAL HIGH (ref 70–99)
Potassium: 4.3 mEq/L (ref 3.7–5.3)
SODIUM: 135 meq/L — AB (ref 137–147)

## 2014-08-19 MED ORDER — AMLODIPINE BESYLATE 10 MG PO TABS
10.0000 mg | ORAL_TABLET | Freq: Every day | ORAL | Status: DC
  Administered 2014-08-20: 10 mg via ORAL
  Filled 2014-08-19: qty 1

## 2014-08-19 MED ORDER — INSULIN ASPART 100 UNIT/ML ~~LOC~~ SOLN
7.0000 [IU] | Freq: Once | SUBCUTANEOUS | Status: AC
  Administered 2014-08-19: 7 [IU] via SUBCUTANEOUS

## 2014-08-19 MED ORDER — AMLODIPINE BESYLATE 10 MG PO TABS: 10.0000 mg | ORAL_TABLET | Freq: Every day | ORAL | Status: DC

## 2014-08-19 MED ORDER — INSULIN GLARGINE 100 UNIT/ML ~~LOC~~ SOLN
8.0000 [IU] | Freq: Every day | SUBCUTANEOUS | Status: DC
  Administered 2014-08-19: 8 [IU] via SUBCUTANEOUS
  Filled 2014-08-19 (×2): qty 0.08

## 2014-08-19 MED ORDER — INSULIN ASPART 100 UNIT/ML ~~LOC~~ SOLN
3.0000 [IU] | Freq: Three times a day (TID) | SUBCUTANEOUS | Status: DC
  Administered 2014-08-19 – 2014-08-20 (×3): 3 [IU] via SUBCUTANEOUS

## 2014-08-19 NOTE — Progress Notes (Signed)
   Echocardiogram 2D Echocardiogram has been performed.  Arvil ChacoFoster, Zeina Akkerman 08/19/2014, 9:51 AM

## 2014-08-19 NOTE — Progress Notes (Signed)
Inpatient Diabetes Program Recommendations  AACE/ADA: New Consensus Statement on Inpatient Glycemic Control (2013)  Target Ranges:  Prepandial:   less than 140 mg/dL      Peak postprandial:   less than 180 mg/dL (1-2 hours)      Critically ill patients:  140 - 180 mg/dL   Reason for Visit: Elevated blood sugars and elevated Hgb A1c.  Diabetes history: Type 2 Outpatient Diabetes medications: Amaryl 2mg  bid; Actos 30 mg daily;Januvia 50 mg daily Current orders for Inpatient glycemic control: Sensitive scale tid. Diet: Dys 3 Appears patient would benefit from basal insulin.  Might consider starting with 0.1 units/kg.  Patient will need to be weighed.  Case manager is looking into a SNF for discharge where he will be able to receive injectable medications. Also, consider adding CHO modified to his current diet.  Mellissa KohutSherrie Ettie Krontz RD, CDE. M.Ed. Pager [phone] Inpatient Diabetes Coordinator

## 2014-08-19 NOTE — Progress Notes (Signed)
Pt seen and examined with Dr. Beckie Saltsivet. Please refer to resident note for details  In brief, pt is a 80 y/o male with PMH of dementia, HTN, CKD stage 3, DM, CAD s/p CABG who p/w syncope * 1 episode. Pt unable to recount history secondary to dementia. Per chart, pt was sitting in a chait and started to have slurred speech and had episodes of transient LOC. Pt was brought to ED for further eval. Currently pt is at baseline per daughter at bedside. Pt had prior h/o syncope 3 years ago. Pt also recently stared on seroquel for insomnia. No CP, no SOB, no abd pain, no lightheadedness, no syncope, no fevers/chills, no n/v, no diarrhea, no dysuria, no cough, no palpitations, no diaphoresis. Remaining ROS negative  Exam: Gen: AAO*2, NAD CVS: RRR, normal heart sounds Pulm: CTA b/l Abd: soft, non tender, BS + Ext: no edema  Assessment and Plan: 80 y/o male with syncope - uncertain etiology   Syncope: - Uncertain etiology - possible seizure v/s vasovagal v/s TIA - Neuro following - EEG completed. F/u results - CT head/MRI brain with no acute CVA, possible acute sinusitis - Monitor on tele  Acute sinusitis: - Will complete course of PO augmentin (5 days)  HTN: - c/w amlodipine. May need to add ACE-I if BP elevated  DM: - BS elevated - 400 today - Started on lantus and pre meal insulin + sliding scale - Will monitor   CKD: - Cr is at 1.3. Uncertain baseline. Will monitor. Likely chronic

## 2014-08-19 NOTE — Evaluation (Signed)
Clinical/Bedside Swallow Evaluation Patient Details  Name: Todd [name] MRN: [id number] Date of Birth: 1932-02-15  Today's Date: 08/19/2014 Time: 0800-0825 SLP Time Calculation (min): 25 min  Past Medical History:  Past Medical History  Diagnosis Date  . Elevated cholesterol   . Hypertension   . Type II diabetes mellitus   . Dementia     "don't know stage or type" (08/18/2014)  . Arthritis     "probably"  . Chronic kidney disease     "? stage" (08/18/2014)   Past Surgical History:  Past Surgical History  Procedure Laterality Date  . Tonsillectomy    . Coronary artery bypass graft  ?1994    "CABG X3"  . Cataract extraction     HPI:  Todd [name] is a 80yo man, resident at [facility], w/ PMHx of dementia, HTN, HLD, CKD Stage III, Type 2 DM, and CAD s/p CABG who presented to the ED after a syncopal episode. Patient did not recall what happened.  Per records, patient was sitting in his chair, started to have slurred speech, began drooling on himself, and then was in and out of consciousness for approximately 20 minutes.  He was brought to the hospital by EMS. MD noted that family stated patient was started on Seroquel two weeks ago. Pt had an episode of syncope 3.5 years ago which was worked up and "everything came back normal." Family denies any previous history of stroke per MD note. Patient notes some weakness in his legs, but does not seem to be new per MD note.  Swallow evaluation ordered at bedside.  Pt was started on dys3/thin diet.     Assessment / Plan / Recommendation Clinical Impression  Pt presents with functional oropharyngeal swallow ability based on clinical swallowing evaluation.  Swallow was timely with clear voice throughout intake of breakfast.  Pt with negative CN exam, except ? mild facial nerve impairment impacting right labial/facial movement.    Please note, due to rapid rate of intake resulting in inadequate oral clearance (right lateral sulci pocketing), pt  would benefit from intermittent supervision to assure maximal safety with po intake.  Pt does admit to using liquids to help clear oral residuals causing SLP to suspect chronic issues for which pt compensates.    Advised pt and daughter to clinical reasoning for slowing rate and adequate oral clearance for airway protection.  SLP to sign off as all  education completed.      Aspiration Risk  Mild    Diet Recommendation Regular;Thin liquid   Liquid Administration via: Cup;Straw Medication Administration: Whole meds with liquid (as tolerated) Supervision: Patient able to self feed;Intermittent supervision to cue for compensatory strategies Compensations: Slow rate;Small sips/bites;Check for pocketing Postural Changes and/or Swallow Maneuvers: Seated upright 90 degrees;Upright 30-60 min after meal    Other  Recommendations Oral Care Recommendations: Oral care BID   Follow Up Recommendations  None    Frequency and Duration   n/a     Pertinent Vitals/Pain Afebrile, decreased    SLP Swallow Goals  n/a   Swallow Study Prior Functional Status   Pt denies dysphagia prior to admission.      General Date of Onset: 08/19/14 HPI: Todd [name] is a 80yo man, resident at [facility], w/ PMHx of dementia, HTN, HLD, CKD Stage III, Type 2 DM, and CAD s/p CABG who presented to the ED after a syncopal episode. Patient did not recall what happened.  Per records, patient was sitting in his chair, started to have  slurred speech, began drooling on himself, and then was in and out of consciousness for approximately 20 minutes.  He was brought to the hospital by EMS. MD noted that family stated patient was started on Seroquel two weeks ago. Pt had an episode of syncope 3.5 years ago which was worked up and "everything came back normal." Family denies any previous history of stroke per MD note. Patient notes some weakness in his legs, but does not seem to be new per MD note.  Swallow evaluation ordered at  bedside.  Pt was started on dys3/thin diet.   Type of Study: Bedside swallow evaluation Diet Prior to this Study: Dysphagia 3 (soft);Thin liquids Temperature Spikes Noted: No Respiratory Status: Room air History of Recent Intubation: No Behavior/Cognition: Alert;Cooperative;Pleasant mood Oral Cavity - Dentition: Adequate natural dentition (missing few teeth but grossly intact) Self-Feeding Abilities: Able to feed self Patient Positioning: Upright in bed Baseline Vocal Quality: Clear Volitional Cough: Strong Volitional Swallow: Able to elicit    Oral/Motor/Sensory Function Overall Oral Motor/Sensory Function: Appears within functional limits for tasks assessed (? mildly decreased movement right facial/labial)   Ice Chips Ice chips: Not tested   Thin Liquid Thin Liquid: Within functional limits Presentation: Straw;Self Fed    Nectar Thick Nectar Thick Liquid: Not tested   Honey Thick Honey Thick Liquid: Not tested   Puree Puree: Within functional limits Presentation: Self Fed;Spoon   Solid   GO    Solid: Impaired Presentation: Self Fed;Spoon Oral Phase Functional Implications: Right lateral sulci pocketing (due to rapid rate of intake and inadequate oral clearance) Other Comments: pt with adequate mastication abilities but tends to eat a rapid rate, cues to slow rate effective      Donavan Burnetamara Sibley Rolison, MS [facility] SLP [phone]

## 2014-08-19 NOTE — ED Provider Notes (Signed)
Medical screening examination/treatment/procedure(s) were conducted as a shared visit with non-physician practitioner(s) and myself.  I personally evaluated the patient during the encounter.   EKG Interpretation   Date/Time:  Wednesday August 18 2014 11:21:07 EDT Ventricular Rate:  75 PR Interval:  164 QRS Duration: 90 QT Interval:  418 QTC Calculation: 466 R Axis:   -20 Text Interpretation:  Normal sinus rhythm Anterior infarct , age  undetermined Abnormal ECG No old ekg to compare Confirmed by Nyle Limb, MD,  Janey GentaANKIT [phone]([id number]) on 08/18/2014 11:40:34 AM       Pt comes in with, what appears to be syncope. Pt demented, unable to give much of any hx. His cardiovascular exam reveals a systolic murmur, 2/5. There is no abd pain, palpable mass. Pt might have had slurred speech. No grossly obvious focal neuro deficit. Will need admission for Syncope. PA-C will call the nursing home about the slurred speech - to see if TIA workup need to be initiated.  Todd KaplanAnkit Tanijah Morais, MD 08/19/14 [id number]

## 2014-08-19 NOTE — Progress Notes (Signed)
Subjective: Patient is alert and oriented to person and hospital this morning. Daughter was at bedside and stated patient was at baseline. Patient had no complaints this morning.  Objective: Vital signs in last 24 hours: Filed Vitals:   08/19/14 0300 08/19/14 0500 08/19/14 1015 08/19/14 1230  BP: 168/74 156/58 166/65   Pulse: 74 79 78   Temp: 98.6 F (37 C) 98.4 F (36.9 C) 97.5 F (36.4 C)   TempSrc: Oral  Oral   Resp: 18 19 18    Height:    5\' 11"  (1.803 m)  Weight:    187 lb 4.8 oz (84.959 kg)  SpO2: 99% 97% 99%    Weight change:   Intake/Output Summary (Last 24 hours) at 08/19/14 1310 Last data filed at 08/19/14 0400  Gross per 24 hour  Intake      3 ml  Output    279 ml  Net   -276 ml   Physical Exam General: alert, sitting up in bed, pleasant, NAD HEENT: [place]/AT, EOMI, mucus membranes moist CV: RRR, 2/6 systolic murmur Pulm: CTA bilaterally, breaths non-labored Abd: BS+, soft, non-tender Ext: warm, no edema, moves all Neuro: alert and oriented to person and hospital, CNs II-XII intact  Lab Results: Basic Metabolic Panel:  Recent Labs Lab 08/18/14 1152 08/19/14 0540  NA 135* 135*  K 4.6 4.3  CL 95* 99  CO2 27 24  GLUCOSE 476* 297*  BUN 24* 22  CREATININE 1.52* 1.36*  CALCIUM 9.5 9.4   Liver Function Tests:  Recent Labs Lab 08/18/14 1152  AST 19  ALT 21  ALKPHOS 63  BILITOT 0.3  PROT 7.3  ALBUMIN 3.6   No results found for this basename: LIPASE, AMYLASE,  in the last 168 hours No results found for this basename: AMMONIA,  in the last 168 hours CBC:  Recent Labs Lab 08/18/14 1152 08/19/14 0540  WBC 4.9 5.6  NEUTROABS 3.8  --   HGB 11.8* 11.0*  HCT 34.5* 31.8*  MCV 89.8 89.8  PLT 208 203   Cardiac Enzymes:  Recent Labs Lab 08/18/14 1816 08/18/14 2255  TROPONINI <0.30 <0.30   BNP: No results found for this basename: PROBNP,  in the last 168 hours D-Dimer: No results found for this basename: DDIMER,  in the last 168  hours CBG:  Recent Labs Lab 08/18/14 1603 08/18/14 1618 08/18/14 1709 08/18/14 2224 08/19/14 0653 08/19/14 1203  GLUCAP 302* 291* 242* 343* 287* 401*   Hemoglobin A1C:  Recent Labs Lab 08/18/14 1816  HGBA1C 13.0*   Fasting Lipid Panel: No results found for this basename: CHOL, HDL, LDLCALC, TRIG, CHOLHDL, LDLDIRECT,  in the last 168 hours Thyroid Function Tests: No results found for this basename: TSH, T4TOTAL, FREET4, T3FREE, THYROIDAB,  in the last 168 hours Coagulation: No results found for this basename: LABPROT, INR,  in the last 168 hours Anemia Panel: No results found for this basename: VITAMINB12, FOLATE, FERRITIN, TIBC, IRON, RETICCTPCT,  in the last 168 hours Urine Drug Screen: Drugs of Abuse  No results found for this basename: labopia, cocainscrnur, labbenz, amphetmu, thcu, labbarb    Alcohol Level: No results found for this basename: ETH,  in the last 168 hours Urinalysis:  Recent Labs Lab 08/18/14 1550  COLORURINE YELLOW  LABSPEC 1.022  PHURINE 7.0  GLUCOSEU >1000*  HGBUR NEGATIVE  BILIRUBINUR NEGATIVE  KETONESUR NEGATIVE  PROTEINUR NEGATIVE  UROBILINOGEN 0.2  NITRITE NEGATIVE  LEUKOCYTESUR NEGATIVE    Micro Results: No results found for this or  any previous visit (from the past 240 hour(s)). Studies/Results: Dg Chest 2 View  08/18/2014   CLINICAL DATA:  Syncope.  EXAM: CHEST  2 VIEW  COMPARISON:  None.  FINDINGS: The heart size and mediastinal contours are within normal limits. Both lungs are clear. No pneumothorax or pleural effusion is noted. Status post coronary artery bypass graft. Degenerative changes of lower lumbar spine are noted.  IMPRESSION: No acute cardiopulmonary abnormality seen.   Electronically Signed   By: Roque LiasJames  Green M.D.   On: 08/18/2014 12:39   Ct Head Wo Contrast  08/18/2014   CLINICAL DATA:  Near syncope.  Slurred speech  EXAM: CT HEAD WITHOUT CONTRAST  TECHNIQUE: Contiguous axial images were obtained from the base of  the skull through the vertex without intravenous contrast.  COMPARISON:  None.  FINDINGS: Generalized atrophy.  Negative for acute infarct.  Negative for hemorrhage or mass lesion.  Chronic sinusitis with mucosal thickening in the left sphenoid and ethmoid sinus. Mild mucosal edema in the maxillary sinus bilaterally. No acute bony abnormality.  IMPRESSION: Generalized atrophy.  No acute abnormality  Chronic sinusitis.   Electronically Signed   By: Marlan Palauharles  Clark M.D.   On: 08/18/2014 12:45   Mr Brain Wo Contrast  08/18/2014   CLINICAL DATA:  Sudden onset of slurred speech and drooling with altered mental status. There is some recovery but the patient is not a baseline. Syncope.  EXAM: MRI HEAD WITHOUT CONTRAST  TECHNIQUE: Multiplanar, multiecho pulse sequences of the brain and surrounding structures were obtained without intravenous contrast.  COMPARISON:  CT head without contrast 08/18/2014.  FINDINGS: The diffusion-weighted images demonstrate no evidence for acute or subacute infarction. No hemorrhage or mass lesion is evident.  The left sphenoid sinus and posterior left ethmoid air cells are opacified with restricted diffusion suggesting acute infection.  Mild atrophy and white matter disease is potentially within normal limits for age. The ventricles are proportionate to the degree of atrophy.  Flow is present in the major intracranial arteries. The patient is status post left lens replacement. The globes and orbits are intact bilaterally. Mild mucosal thickening is present in the anterior ethmoid air cells and inferior frontal sinuses. There is fluid in the right mastoid air cells. No obstructing nasopharyngeal lesion is evident.  IMPRESSION: 1. No evidence for acute infarct. 2. Mild generalized atrophy and white matter disease, potentially within normal limits for age. 3. Posterior left ethmoid and sphenoid sinus disease with restricted diffusion compatible with acute sinusitis. There is likely a chronic  component as well.   Electronically Signed   By: Gennette Pachris  Mattern M.D.   On: 08/18/2014 19:07   Medications: I have reviewed the patient's current medications. Scheduled Meds: . amoxicillin-clavulanate  1 tablet Oral TID  . aspirin EC  325 mg Oral Daily  . atorvastatin  10 mg Oral q1800  . docusate sodium  100 mg Oral BID  . heparin  5,000 Units Subcutaneous 3 times per day  . insulin aspart  0-9 Units Subcutaneous TID WC  . insulin aspart  3 Units Subcutaneous TID WC  . insulin glargine  8 Units Subcutaneous QHS  . sodium chloride  3 mL Intravenous Q12H   Continuous Infusions:  PRN Meds:.acetaminophen, acetaminophen, ondansetron (ZOFRAN) IV, ondansetron, traZODone Assessment/Plan: [name] is a 80yo man, resident at a nursing home, w/ PMHx of dementia, HTN, HLD, Type 2 DM, and CAD s/p CABG who presented to the ED after a questionable syncopal episode.   1. Syncope?: Patient has  no complaints. He is back to baseline per daughter. CT Head and MRI Brain negative for acute stroke. 2D Echo shows EF 55-60%, grade I diastolic dysfunction. Troponin negative. EEG pending. Symptoms seem most consistent with seizure.  - Neurology on board, appreciate recommendations - f/u EEG - PT consulted - Continue cardiac monitoring  2. Acute Sinusitis: Found on MRI. No WBC count. Afebrile.  - Continue Augmentin 500-125 mg TID   3. HTN: BP in 140-170s/50-60s. Will restart home Amlodipine. - Start Amlodipine 10 mg daily  4. Type 2 DM: Patient's blood glucoses continue to be elevated in 240s-400s. Will start patient on meal time Novolog coverage and long-acting basal insulin. - Start Lantus 8 units QHS - Start Novolog 3 units TID with meals - Continue sensitive ISS - CBGs 4 times daily  5. Hx CAD s/p CABG:  - Continue home aspirin 325 mg - Continue home Lipitor 10 mg daily  6. HLD:  - Continue home Atorvastatin 10 mg daily  7. CKD Stage III: Per family, patient has history of CKD. No baseline  available. Cr 1.52 on admission, now 1.36.  - Continue to monitor  8. Dementia: Stable. Per family, patient has history of dementia and is in nursing home. He is normally able to walk and talk without any difficulties.  Diet: DYS 3  DVT/PE PPx: Heparin SQ  Dispo: Disposition is deferred at this time, awaiting improvement of current medical problems. Anticipated discharge in approximately 1-2 day(s).   The patient does have a current PCP Marletta Lor, NP) and does need an [facility] hospital follow-up appointment after discharge.  The patient does not have transportation limitations that hinder transportation to clinic appointments.  .Services Needed at time of discharge: Y = Yes, Blank = No PT:   OT:   RN:   Equipment:   Other:     LOS: 1 day   Rich Number, MD 08/19/2014, 1:10 PM

## 2014-08-19 NOTE — H&P (Signed)
Pt seen and examined. Please refer to my progress note from today for details. i agree with documentation as outlined

## 2014-08-19 NOTE — Progress Notes (Signed)
EEG Completed; Results Pending  

## 2014-08-19 NOTE — Procedures (Signed)
ELECTROENCEPHALOGRAM REPORT   Patient: Todd [name]       Room #: 1O104N04 EEG No. ID: [id number] Age: 80 y.o.        Sex: male Referring Physician: Heide SparkNarendra Report Date:  08/19/2014        Interpreting Physician: Thana FarrEYNOLDS,Robb Sibal D  History: Todd [name] is an 80 y.o. male with syncope evaluated to rule out seizure  Medications:  Scheduled: . [START ON 08/20/2014] amLODipine  10 mg Oral Daily  . amoxicillin-clavulanate  1 tablet Oral TID  . aspirin EC  325 mg Oral Daily  . atorvastatin  10 mg Oral q1800  . docusate sodium  100 mg Oral BID  . heparin  5,000 Units Subcutaneous 3 times per day  . insulin aspart  0-9 Units Subcutaneous TID WC  . insulin aspart  3 Units Subcutaneous TID WC  . insulin glargine  8 Units Subcutaneous QHS  . sodium chloride  3 mL Intravenous Q12H    Conditions of Recording:  This is a 16 channel EEG carried out with the patient in the awake and drowsy states.  Description:  Artifact is prominent throughout the recording.  During what appears to be clinical wakefulness the background activity consists of a low voltage, symmetrical, fairly well organized, 6-7 Hz theta activity, seen from the parieto-occipital and posterior temporal regions.  There is a preponderance of slower rhythms noted from the central and temporal regions as well with theta and delta rhythms being most frequent.  This activity is persistent throughout the recording.    Stage II sleep is not obtained. No epileptiform activity is noted.   Hyperventilation and intermittent photic stimulation were not performed.   IMPRESSION: This EEG is characterized by slowing which is consistent with normal drowse.  Can not rule out the possibility of slowing related to general cerebral disturbance such as a metabolic encephalopathy.  Clinical correlation recommended.  No epileptiform activity is noted.      Thana FarrLeslie Ikeem Cleckler, MD [facility] [phone] 08/19/2014, 4:23 PM

## 2014-08-19 NOTE — Clinical Social Work Psychosocial (Signed)
Clinical Social Work Department BRIEF PSYCHOSOCIAL ASSESSMENT 08/19/2014  Patient:  Todd [name], Todd [name]     Account Number:  [id number]     [place] date:  08/18/2014  Clinical Social Worker:  Glendon Axe, CLINICAL SOCIAL WORKER  Date/Time:  08/19/2014 12:03 PM  Referred by:  Care Management  Date Referred:  08/19/2014 Referred for  ALF Placement   Other Referral:   Interview type:  Other - See comment Other interview type:   CSW spoke with pt and pt's daughter, Todd [name] at bedside.    PSYCHOSOCIAL DATA Living Status:  FACILITY Admitted from facility:   Level of care:  Assisted Living Primary support name:  Todd [name] HCPOA [phone] Primary support relationship to patient:  CHILD, ADULT Degree of support available:   Strong    CURRENT CONCERNS Current Concerns  Post-Acute Placement   Other Concerns:    SOCIAL WORK ASSESSMENT / PLAN Clincial Social Worker met with pt and pt's daughter, Todd [name] in reference to post-acute placement options. [name] reported pt was admitted from [facility] and that pt has been there since July 2015. CSW explained placement process. [name] would like for pt to return to [facility] if facility is able to manage pt's care. CSW will contact [facility] to confirm pt's return. CSW will follow pt to provide continued support and facilitate pt's discharge needs when pt is medically stable.   Assessment/plan status:  Psychosocial Support/Ongoing Assessment of Needs Other assessment/ plan:   Information/referral to community resources:   SNF/ALF lists provided.    PATIENT'S/FAMILY'S RESPONSE TO PLAN OF CARE: Pt lying in bed watching tv. Pt alert and oriented. Pt having procedure during interview. Pt and pt's daughter agreeable to pt's return to [facility] if facility is able to manage pt's care.       Glendon Axe, MSW, LCSWA [phone] 08/19/2014 12:34 PM

## 2014-08-19 NOTE — Evaluation (Signed)
Occupational Therapy Evaluation Patient Details Name: Philis FendtMilton Goins MRN: [id number] DOB: 1931-11-08 Today's Date: 08/19/2014    History of Present Illness 80 yo male with admission from ALF with syncope and elevated BS.  All testing has been negative up to this point-MRI and CT of head.     Clinical Impression   Patient evaluated by Occupational Therapy with no further acute OT needs identified. All education has been completed and the patient has no further questions. See below for any follow-up Occupational Therapy or equipment needs. OT to sign off. Thank you for referral.  Pt will need (A) with adls and if facility can not (A) at [facility] then will need SNF section. Pt is progressing toward baseline but with decr balance at this time.     Follow Up Recommendations  Home health OT;SNF (will need (A) for adls- if facility can not (A) will needSNF)    Equipment Recommendations  Other (comment) (defer to facility)    Recommendations for Other Services       Precautions / Restrictions Precautions Precautions: Fall Restrictions Weight Bearing Restrictions: No      Mobility Bed Mobility Overal bed mobility: Needs Assistance Bed Mobility: Supine to Sit     Supine to sit: Min guard     General bed mobility comments: in chair on arrival  Transfers Overall transfer level: Needs assistance Equipment used: Rolling walker (2 wheeled) Transfers: Sit to/from Stand Sit to Stand: Supervision Stand pivot transfers: Supervision       General transfer comment: cues for RW safety    Balance Overall balance assessment: Needs assistance Sitting-balance support: Feet supported Sitting balance-Leahy Scale: Good Sitting balance - Comments: uses UE's to prop in lap Postural control: Posterior lean Standing balance support: Bilateral upper extremity supported;During functional activity Standing balance-Leahy Scale: Fair Standing balance comment: UE's on walker is steadying                High Level Balance Comments: turning and ambulated ~100 ft with RW. Pt with wide turns with RW for balance            ADL Overall ADL's : Needs assistance/impaired Eating/Feeding: Modified independent;Sitting   Grooming: Oral care;Wash/dry face;Min guard;Standing Grooming Details (indicate cue type and reason): lOB x1 at sink level. Pt cued to LOB and able to self correct and remained with ankle strategies throughout task                 Toilet Transfer: Supervision/safety;Regular Toilet;RW           Functional mobility during ADLs: Min guard;Rolling walker General ADL Comments: Pt needed cues for Rw. pt abandoned in Rw at sink level. Pt is close to baseline but will need additional help upon return to ALF> If facility is unable to provide supervision for bathing recommend SNF     Vision                 Additional Comments: unknown PTA. Pt able to scan environement in central vision.    Perception     Praxis      Pertinent Vitals/Pain Pain Assessment: No/denies pain     Hand Dominance Right   Extremity/Trunk Assessment Upper Extremity Assessment Upper Extremity Assessment: Overall WFL for tasks assessed (age appropriate decr fine motor)   Lower Extremity Assessment Lower Extremity Assessment: Defer to PT evaluation   Cervical / Trunk Assessment Cervical / Trunk Assessment: Normal   Communication Communication Communication: No difficulties   Cognition Arousal/Alertness: Awake/alert Behavior During  Therapy: WFL for tasks assessed/performed Overall Cognitive Status: History of cognitive impairments - at baseline       Memory: Decreased recall of precautions;Decreased short-term memory             General Comments       Exercises       Shoulder Instructions      Home Living Family/patient expects to be discharged to:: Assisted living                             Home Equipment: Walker - 2 wheels    Additional Comments: DME to come from facility      Prior Functioning/Environment Level of Independence: Independent with assistive device(s)             OT Diagnosis: Generalized weakness   OT Problem List:     OT Treatment/Interventions:      OT Goals(Current goals can be found in the care plan section) Acute Rehab OT Goals Patient Stated Goal: did not state OT Goal Formulation: Patient unable to participate in goal setting  OT Frequency:     Barriers to D/C:            Co-evaluation              End of Session Equipment Utilized During Treatment: Gait belt;Rolling walker Nurse Communication: Mobility status  Activity Tolerance: Patient tolerated treatment well Patient left: in chair;with call bell/phone within reach;with chair alarm set   Time: 1443-1457 OT Time Calculation (min): 14 min Charges:  OT General Charges $OT Visit: 1 Procedure OT Evaluation $Initial OT Evaluation Tier I: 1 Procedure OT Treatments $Self Care/Home Management : 8-22 mins G-Codes:    Boone MasterJones, Quinnley Colasurdo B 08/19/2014, 3:00 PM Pager: [phone]

## 2014-08-19 NOTE — Evaluation (Signed)
Physical Therapy Evaluation Patient Details Name: Todd FendtMilton [name] MRN: [id number] DOB: 04-10-1932 Today's Date: 08/19/2014   History of Present Illness  80 yo male with admission from ALF with syncope and elevated BS.  All testing has been negative up to this point-MRI and CT of head.    Clinical Impression  Pt was assessed for a potential return to ALF with safety concerns noted.  Pt is able to walk but requires supervised walking and will be on his own at times in the typical ALF.  Gait must be assisted initially and should be conducted with staff preferably.  HHPT needed if in-house staff not there.    Follow Up Recommendations Home health PT;Supervision/Assistance - 24 hour;Other (comment) (HHPT if in-house services are not present at ALF)    Equipment Recommendations  None recommended by PT    Recommendations for Other Services       Precautions / Restrictions Precautions Precautions: Fall Restrictions Weight Bearing Restrictions: No      Mobility  Bed Mobility Overal bed mobility: Needs Assistance Bed Mobility: Supine to Sit     Supine to sit: Min guard     General bed mobility comments: uses bed rail and elevated HOB  Transfers Overall transfer level: Needs assistance Equipment used: Rolling walker (2 wheeled);1 person hand held assist Transfers: Sit to/from UGI CorporationStand;Stand Pivot Transfers Sit to Stand: Min assist Stand pivot transfers: Min assist       General transfer comment: Has cues every time he transitions for hand placement with no recall observed by PT  Ambulation/Gait Ambulation/Gait assistance: Min guard Ambulation Distance (Feet): 225 Feet Assistive device: Rolling walker (2 wheeled);1 person hand held assist Gait Pattern/deviations: Wide base of support;Trunk flexed;Decreased stride length;Decreased dorsiflexion - left;Decreased dorsiflexion - right Gait velocity: reduced Gait velocity interpretation: Below normal speed for age/gender     Stairs            Wheelchair Mobility    Modified Rankin (Stroke Patients Only)       Balance Overall balance assessment: Needs assistance Sitting-balance support: Feet supported Sitting balance-Leahy Scale: Good Sitting balance - Comments: uses UE's to prop in lap Postural control: Posterior lean Standing balance support: Bilateral upper extremity supported Standing balance-Leahy Scale: Poor Standing balance comment: UE's on walker is steadying                             Pertinent Vitals/Pain Pain Assessment: No/denies pain    Home Living Family/patient expects to be discharged to:: Assisted living               Home Equipment: Walker - 2 wheels Additional Comments: Pt is in healthcare situation and is able to be given equipment as needed    Prior Function Level of Independence: Independent with assistive device(s)               Hand Dominance        Extremity/Trunk Assessment   Upper Extremity Assessment: Overall WFL for tasks assessed           Lower Extremity Assessment: Generalized weakness      Cervical / Trunk Assessment: Normal  Communication   Communication: No difficulties  Cognition Arousal/Alertness: Awake/alert Behavior During Therapy: WFL for tasks assessed/performed Overall Cognitive Status: History of cognitive impairments - at baseline       Memory: Decreased recall of precautions;Decreased short-term memory  General Comments General comments (skin integrity, edema, etc.): Pt has been assessed for potential return to ALF.  He needs some supervisory/guarding assist to mobilize, will require 24/7 supervision to succeed    Exercises        Assessment/Plan    PT Assessment Patient needs continued PT services  PT Diagnosis Difficulty walking   PT Problem List Decreased strength;Decreased range of motion;Decreased activity tolerance;Decreased balance;Decreased mobility;Decreased  coordination;Decreased cognition;Decreased knowledge of use of DME;Decreased safety awareness  PT Treatment Interventions DME instruction;Gait training;Functional mobility training;Therapeutic activities;Therapeutic exercise;Balance training;Neuromuscular re-education;Cognitive remediation;Patient/family education   PT Goals (Current goals can be found in the Care Plan section) Acute Rehab PT Goals Patient Stated Goal: did not state PT Goal Formulation: Patient unable to participate in goal setting Time For Goal Achievement: 09/02/14 Potential to Achieve Goals: Good    Frequency Min 3X/week   Barriers to discharge Other (comment);Decreased caregiver support (limited independence for his care level anticipated) staff ratio is lower in ALF as it woud be assumeded he is I walker    Co-evaluation               End of Session Equipment Utilized During Treatment: Gait belt Activity Tolerance: Patient tolerated treatment well;Patient limited by fatigue Patient left: in chair;with call bell/phone within reach;with chair alarm set Nurse Communication: Mobility status;Other (comment) (use of alarm pad in chaire)         Time: [id number] PT Time Calculation (min): 27 min   Charges:   PT Evaluation $Initial PT Evaluation Tier I: 1 Procedure PT Treatments $Gait Training: 8-22 mins   PT G Codes:          Ivar DrapeStout, Quest Tavenner E 08/19/2014, 1:32 PM  Samul Dadauth Nazar Kuan, PT MS Acute Rehab Dept. Number: [phone]

## 2014-08-20 DIAGNOSIS — J019 Acute sinusitis, unspecified: Secondary | ICD-10-CM

## 2014-08-20 DIAGNOSIS — E785 Hyperlipidemia, unspecified: Secondary | ICD-10-CM

## 2014-08-20 DIAGNOSIS — Z8679 Personal history of other diseases of the circulatory system: Secondary | ICD-10-CM

## 2014-08-20 DIAGNOSIS — N183 Chronic kidney disease, stage 3 (moderate): Secondary | ICD-10-CM

## 2014-08-20 LAB — GLUCOSE, CAPILLARY
GLUCOSE-CAPILLARY: 236 mg/dL — AB (ref 70–99)
Glucose-Capillary: 231 mg/dL — ABNORMAL HIGH (ref 70–99)
Glucose-Capillary: 267 mg/dL — ABNORMAL HIGH (ref 70–99)

## 2014-08-20 LAB — BASIC METABOLIC PANEL
ANION GAP: 16 — AB (ref 5–15)
BUN: 23 mg/dL (ref 6–23)
CHLORIDE: 97 meq/L (ref 96–112)
CO2: 22 mEq/L (ref 19–32)
Calcium: 9.5 mg/dL (ref 8.4–10.5)
Creatinine, Ser: 1.46 mg/dL — ABNORMAL HIGH (ref 0.50–1.35)
GFR calc non Af Amer: 44 mL/min — ABNORMAL LOW (ref >90)
GFR, EST AFRICAN AMERICAN: 51 mL/min — AB (ref >90)
Glucose, Bld: 201 mg/dL — ABNORMAL HIGH (ref 70–99)
POTASSIUM: 3.9 meq/L (ref 3.7–5.3)
Sodium: 135 mEq/L — ABNORMAL LOW (ref 137–147)

## 2014-08-20 MED ORDER — AMOXICILLIN-POT CLAVULANATE 500-125 MG PO TABS: 1.0000 | ORAL_TABLET | Freq: Three times a day (TID) | ORAL | Status: AC

## 2014-08-20 NOTE — Progress Notes (Signed)
Pt seen and examined with Dr. Andrey CampanileWilson. He feels well. No new complaints. He is confused but this is baseline  Exam:  Gen: AAO*2, NAD  CVS: RRR, normal heart sounds  Pulm: CTA b/l  Abd: soft, non tender, BS +  Ext: no edema   Assessment and Plan:  80 y/o male with syncope - uncertain etiology   Syncope:  - Uncertain etiology - possible seizure v/s vasovagal v/s TIA  - Neuro signed off. EEG is unremarkable - Will not start anticonvulsants at this time - Outpatient f/u with PCP   Acute sinusitis:  - Will complete course of PO augmentin (5 days)   HTN:  - c/w amlodipine.Would consider adding ACE-I if renal function is at baseline - Awaiting outpatient records  DM:  - BS improved - Diabetes coordinator note appreciated - Will d/c lantus and start levemir 10 units - If pt goes to SNF would prefer keeping him on insulin rather than home meds otherwise will resume home meds  CKD:  - Cr is at 1.4. Uncertain baseline. Will monitor. Likely chronic  Pt stable for d/c today if facility willing to accept him

## 2014-08-20 NOTE — Progress Notes (Signed)
Clinical Social Worker has been working with [facility] to coordinate [facility]'s return. CSW faxed clinicals and [facility]'s admissions coordinator reported pending nurse evaluation to assess any changes in care. CSW will keep in contact with facility to facilitate patient's discharge needs.  Derenda FennelBashira Daimian Sudberry, MSW, LCSWA [phone] 08/20/2014 3:25 PM

## 2014-08-20 NOTE — Progress Notes (Signed)
NEURO HOSPITALIST PROGRESS NOTE   SUBJECTIVE:                                                                                                                        Comfortable in bed. Offers no neurological complains. MRI showed no acute abnormality. EEG: no epileptiform discharges, normal slowing of drowsiness versus slowing related to general cerebral disturbance such as a metabolic encephalopathy. Slight hyponatremia. Cr 1.46  OBJECTIVE:                                                                                                                           Vital signs in last 24 hours: Temp:  [97.7 F (36.5 C)-98.6 F (37 C)] 98.1 F (36.7 C) (10/23 1014) Pulse Rate:  [70-81] 70 (10/23 1014) Resp:  [16-18] 16 (10/23 1014) BP: (132-166)/(58-92) 159/71 mmHg (10/23 1014) SpO2:  [100 %] 100 % (10/23 1014) Weight:  [84.959 kg (187 lb 4.8 oz)] 84.959 kg (187 lb 4.8 oz) (10/22 1230)  Intake/Output from previous day: 10/22 0701 - 10/23 0700 In: -  Out: 1 [Urine:1] Intake/Output this shift:   Nutritional status: Dysphagia  Past Medical History  Diagnosis Date  . Elevated cholesterol   . Hypertension   . Type II diabetes mellitus   . Dementia     "don't know stage or type" (08/18/2014)  . Arthritis     "probably"  . Chronic kidney disease     "? stage" (08/18/2014)   Physical exam: pleasant male in no apparent distress. Head: normocephalic. Neck: supple, no bruits, no JVD. Cardiac: no murmurs. Lungs: clear. Abdomen: soft, no tender, no mass. Extremities: no edema. Neurologic Exam:  General: NAD  Mental Status:  Alert, disoriented to hospital-year-month-day and situation. No dysarthria or evidence of aphasia. Able to follow 3 step commands without difficulty.  Cranial Nerves:  II: Discs flat bilaterally; Visual fields grossly normal, pupils equal, round, reactive to light and accommodation  III,IV, VI: ptosis not present,  extra-ocular motions intact bilaterally  V,VII: smile symmetric, facial light touch sensation normal bilaterally  VIII: hearing normal bilaterally  IX,X: gag reflex present  XI: bilateral shoulder shrug  XII: midline tongue extension without atrophy or fasciculations  Motor:  Right : Upper extremity  5/5 Left: Upper extremity 5/5  Lower extremity 5/5 Lower extremity 5/5  Tone and bulk:normal tone throughout; no atrophy noted  Sensory: Pinprick and light touch intact throughout, bilaterally  Deep Tendon Reflexes:  Right: Upper Extremity Left: Upper extremity  biceps (C-5 to C-6) 2/4 biceps (C-5 to C-6) 2/4  tricep (C7) 2/4 triceps (C7) 2/4  Brachioradialis (C6) 2/4 Brachioradialis (C6) 2/4  Lower Extremity Lower Extremity  quadriceps (L-2 to L-4) 2/4 quadriceps (L-2 to L-4) 2/4  Achilles (S1) 2/4 Achilles (S1) 2/4  Plantars:  Right: downgoing Left: downgoing  Cerebellar:  normal finger-to-nose, normal heel-to-shin test  Gait: not tested due to safety reasons    Lab Results: No results found for this basename: cbc, bmp, coags, chol, tri, ldl, hga1c   Lipid Panel No results found for this basename: CHOL, TRIG, HDL, CHOLHDL, VLDL, LDLCALC,  in the last 72 hours  Studies/Results: Dg Chest 2 View  08/18/2014   CLINICAL DATA:  Syncope.  EXAM: CHEST  2 VIEW  COMPARISON:  None.  FINDINGS: The heart size and mediastinal contours are within normal limits. Both lungs are clear. No pneumothorax or pleural effusion is noted. Status post coronary artery bypass graft. Degenerative changes of lower lumbar spine are noted.  IMPRESSION: No acute cardiopulmonary abnormality seen.   Electronically Signed   By: Roque Lias M.D.   On: 08/18/2014 12:39   Ct Head Wo Contrast  08/18/2014   CLINICAL DATA:  Near syncope.  Slurred speech  EXAM: CT HEAD WITHOUT CONTRAST  TECHNIQUE: Contiguous axial images were obtained from the base of the skull through the vertex without intravenous contrast.  COMPARISON:   None.  FINDINGS: Generalized atrophy.  Negative for acute infarct.  Negative for hemorrhage or mass lesion.  Chronic sinusitis with mucosal thickening in the left sphenoid and ethmoid sinus. Mild mucosal edema in the maxillary sinus bilaterally. No acute bony abnormality.  IMPRESSION: Generalized atrophy.  No acute abnormality  Chronic sinusitis.   Electronically Signed   By: Marlan Palau M.D.   On: 08/18/2014 12:45   Mr Brain Wo Contrast  08/18/2014   CLINICAL DATA:  Sudden onset of slurred speech and drooling with altered mental status. There is some recovery but the patient is not a baseline. Syncope.  EXAM: MRI HEAD WITHOUT CONTRAST  TECHNIQUE: Multiplanar, multiecho pulse sequences of the brain and surrounding structures were obtained without intravenous contrast.  COMPARISON:  CT head without contrast 08/18/2014.  FINDINGS: The diffusion-weighted images demonstrate no evidence for acute or subacute infarction. No hemorrhage or mass lesion is evident.  The left sphenoid sinus and posterior left ethmoid air cells are opacified with restricted diffusion suggesting acute infection.  Mild atrophy and white matter disease is potentially within normal limits for age. The ventricles are proportionate to the degree of atrophy.  Flow is present in the major intracranial arteries. The patient is status post left lens replacement. The globes and orbits are intact bilaterally. Mild mucosal thickening is present in the anterior ethmoid air cells and inferior frontal sinuses. There is fluid in the right mastoid air cells. No obstructing nasopharyngeal lesion is evident.  IMPRESSION: 1. No evidence for acute infarct. 2. Mild generalized atrophy and white matter disease, potentially within normal limits for age. 3. Posterior left ethmoid and sphenoid sinus disease with restricted diffusion compatible with acute sinusitis. There is likely a chronic component as well.   Electronically Signed   By: Gennette Pac M.D.    On: 08/18/2014 19:07  MEDICATIONS                                                                                                                        Scheduled: . amLODipine  10 mg Oral Daily  . amoxicillin-clavulanate  1 tablet Oral TID  . aspirin EC  325 mg Oral Daily  . atorvastatin  10 mg Oral q1800  . docusate sodium  100 mg Oral BID  . heparin  5,000 Units Subcutaneous 3 times per day  . insulin aspart  0-9 Units Subcutaneous TID WC  . insulin aspart  3 Units Subcutaneous TID WC  . insulin glargine  8 Units Subcutaneous QHS  . sodium chloride  3 mL Intravenous Q12H    ASSESSMENT/PLAN:                                                                                                           [id number] Male with dementia admitted after he was noted to have a change in mental status that lasted for about 15-20 minutes which included confusion and waxing and waning level of consciousness followed by amnestic period of event. Neuro-imaging and EEG unremarkable. He was significantly hyperglycemic around the time of this event. Although higher incidence of seizures in patients with dementia, will not suggest starting him empirically on anticonvulsant at this moment. Follow up with PCP or neurology after discharge. No further  inpatient neuro work up needed. Will sign off.  Wyatt Portelasvaldo Brooks Stotz, MD [facility] [phone]  08/20/2014, 11:08 AM

## 2014-08-20 NOTE — Progress Notes (Signed)
Subjective: Patient is alert and oriented to person this morning. He is pleasant and cooperative.    Objective: Vital signs in last 24 hours: Filed Vitals:   08/19/14 1634 08/19/14 2148 08/20/14 0202 08/20/14 0500  BP: 132/58 164/91 165/89 166/92  Pulse: 78 81 81 81  Temp: 98.6 F (37 C) 98.4 F (36.9 C) 98.4 F (36.9 C) 98.3 F (36.8 C)  TempSrc: Oral Oral Oral Oral  Resp: 18 18 16 16   Height:      Weight:      SpO2: 100% 100% 100% 100%   Weight change:   Intake/Output Summary (Last 24 hours) at 08/20/14 0727 Last data filed at 08/19/14 2031  Gross per 24 hour  Intake      0 ml  Output      1 ml  Net     -1 ml   Physical Exam General: alert, sitting up in chair, pleasant, NAD HEENT: [place]/AT CV: RRR Pulm: CTA bilaterally, breaths non-labored Abd: BS+, soft, non-tender Ext: warm, no edema, moves all Neuro: alert and oriented to person, strength equal and intact upper and lower extremities  Lab Results: Basic Metabolic Panel:  Recent Labs Lab 08/19/14 0540 08/20/14 0505  NA 135* 135*  K 4.3 3.9  CL 99 97  CO2 24 22  GLUCOSE 297* 201*  BUN 22 23  CREATININE 1.36* 1.46*  CALCIUM 9.4 9.5   Liver Function Tests:  Recent Labs Lab 08/18/14 1152  AST 19  ALT 21  ALKPHOS 63  BILITOT 0.3  PROT 7.3  ALBUMIN 3.6   CBC:  Recent Labs Lab 08/18/14 1152 08/19/14 0540  WBC 4.9 5.6  NEUTROABS 3.8  --   HGB 11.8* 11.0*  HCT 34.5* 31.8*  MCV 89.8 89.8  PLT 208 203   Cardiac Enzymes:  Recent Labs Lab 08/18/14 1816 08/18/14 2255  TROPONINI <0.30 <0.30   CBG:  Recent Labs Lab 08/18/14 2224 08/19/14 0653 08/19/14 1203 08/19/14 1650 08/19/14 2144 08/20/14 0650  GLUCAP 343* 287* 401* 331* 231* 236*   Hemoglobin A1C:  Recent Labs Lab 08/18/14 1816  HGBA1C 13.0*   Urinalysis:  Recent Labs Lab 08/18/14 1550  COLORURINE YELLOW  LABSPEC 1.022  PHURINE 7.0  GLUCOSEU >1000*  HGBUR NEGATIVE  BILIRUBINUR NEGATIVE  KETONESUR NEGATIVE    PROTEINUR NEGATIVE  UROBILINOGEN 0.2  NITRITE NEGATIVE  LEUKOCYTESUR NEGATIVE   Studies/Results: 08/19/2014 EEG:  This EEG is characterized by slowing which is consistent with normal drowse. Can not rule out the possibility of slowing related to general cerebral disturbance such as a metabolic encephalopathy. Clinical correlation recommended. No epileptiform activity is noted.   Medications: I have reviewed the patient's current medications. Scheduled Meds: . amLODipine  10 mg Oral Daily  . amoxicillin-clavulanate  1 tablet Oral TID  . aspirin EC  325 mg Oral Daily  . atorvastatin  10 mg Oral q1800  . docusate sodium  100 mg Oral BID  . heparin  5,000 Units Subcutaneous 3 times per day  . insulin aspart  0-9 Units Subcutaneous TID WC  . insulin aspart  3 Units Subcutaneous TID WC  . insulin glargine  8 Units Subcutaneous QHS  . sodium chloride  3 mL Intravenous Q12H   Continuous Infusions:  PRN Meds:.acetaminophen, acetaminophen, ondansetron (ZOFRAN) IV, ondansetron, traZODone  Assessment/Plan: [name] is a 80yo man, resident at a nursing home, w/ PMHx of dementia, HTN, HLD, Type 2 DM, and CAD s/p CABG who presented to the ED after a questionable syncopal  episode.   1. Syncope vs seizure: Symptoms seem most consistent seizure vs vasovagal syncope.  MRI negative for acute stroke.  No arrhythmias, he has not been hypoxic.  Family reported similar episode with negative work-up 3 years ago.  He has had no further episodes during admission.  Neurology recommendation and intervention appreciated.  EEG unremarkable.  Patient is back to baseline (daughter confirmed this yesterday).  PT has evaluated him and recommends assistance with ADLs.  If this cannot be provided by his ALF then SNF is recommended.  - he is medically stable for discharge when ALF or SNF available; given his dementia he would benefit from SNF where he will get 24 hour assistance and medication management.  - will plan  for follow-up with PCP and/or neurology after discharge  2. Acute Sinusitis: Found on MRI. No WBC count. Afebrile.  - continue Augmentin 500-125 mg TID x 4 more days for 7 day course of treatment  3. HTN: BP in 160s/90s overnight but improved to 140s SBP after resuming home amlodipine.  Continue home medications at discharge.  4. Type 2 DM: Patient's CBG elevated to 400s so Lantus was started last night and SSI continued.  There was improvement CBGs today (200s) however, given his A1c of 13 he would benefit from insulin as an outpatient.  Per daughter, ALF is unable to provide insulin.  This is another reason SNF would be a better option for this patient.  - if discharged to SNF, will discharge on Lantus 10 units and Novolog 3 units TID if discharged to SNF - if discharged to ALF, will resume his Actos, glimepiride and Januvia with follow-up with PCP for further DM control recommendations  5. Hx CAD s/p CABG: stable.  Continue ASA and statin.  6. HLD: stable. Continue Atorvastatin 10 mg daily  7. CKD Stage III: Per family, patient has history of CKD. No baseline available. Cr 1.52 on admission, now 1.46.  - will need continued monitoring as an outpatient  8. Dementia: Stable. Per family, patient has history of dementia and is in nursing home. He is normally able to walk and talk without any difficulties.  Diet: DYS 3  DVT/PE PPx: Heparin SQ  Dispo: He is medically stable for discharge to SNF or ALF today.   The patient does have a current PCP Todd [name]([name], NP) and does need an [facility] follow-up appointment after discharge.  The patient does not have transportation limitations that hinder transportation to clinic appointments.  .Services Needed at time of discharge: Y = Yes, Blank = No PT:   OT:   RN:   Equipment:   Other:     LOS: 2 days   Yolanda MangesAlex M Wilson, DO 08/20/2014, 7:27 AM

## 2014-08-20 NOTE — Progress Notes (Signed)
OT NOTE- LATE GCODE Todd ProctorENTRY          Todd [name], [name]   OTR/L Pager: [phone] Office: [phone] .       08/19/14 1400  OT G-codes **NOT FOR INPATIENT CLASS**  Functional Assessment Tool Used clinical judgement  Functional Limitation Self care  Self Care Current Status [phone]([id number]) [facility]  Self Care Goal Status ([phone]) [facility]  Self Care Discharge Status [phone]([id number]) [facility]

## 2014-08-20 NOTE — Discharge Summary (Signed)
Patient Name:  Todd [name] Oshea  MRN: [id number]  PCP: Marletta LorBarr, Julie, NP  DOB:  Apr 26, 1932       Date of Admission:  08/18/2014  Date of Discharge:  08/20/2014      Attending Physician: Dr. Earl LagosNischal Narendra, MD         DISCHARGE DIAGNOSES: 1.   Syncope 2.   Acute sinusitis 3.   Diabetes mellitus, type 2 4.   CKD (chronic kidney disease) 5.   Dementia 6.   HTN (hypertension)   DISPOSITION AND FOLLOW-UP: Todd [name] Jamison is to follow-up with the listed providers as detailed below, at patient's visiting, please address following issues:  1.  Has he transitioned to skilled nursing facility? 2.  Has he been evaluated by neurology/neuropsychiatry? 3.  How is his blood sugar control?  Is he at a facility that can provide insulin therapy? 4.  Has he had any further episodes of syncope? 5.  Renal function?  Blood pressure control?  He can resume lisinopril if blood pressure is elevated and renal function is stable.  If he still needs another blood pressure medication, hydrochlorothiazide can be added back as well.   DISCHARGE MEDICATIONS:   Medication List    STOP taking these medications       hydrochlorothiazide 25 MG tablet  Commonly known as:  HYDRODIURIL     lisinopril 10 MG tablet  Commonly known as:  PRINIVIL,ZESTRIL     QUEtiapine 50 MG tablet  Commonly known as:  SEROQUEL      TAKE these medications       amLODipine 10 MG tablet  Commonly known as:  NORVASC  Take 10 mg by mouth daily.     amoxicillin-clavulanate 500-125 MG per tablet  Commonly known as:  AUGMENTIN  Take 1 tablet (500 mg total) by mouth 3 (three) times daily.  Start taking on:  08/21/2014     aspirin EC 325 MG tablet  Take 325 mg by mouth daily.     atorvastatin 10 MG tablet  Commonly known as:  LIPITOR  Take 10 mg by mouth daily.     glimepiride 2 MG tablet  Commonly known as:  AMARYL  Take 2 mg by mouth 2 (two) times daily.     pioglitazone 30 MG tablet  Commonly known as:  ACTOS    Take 30 mg by mouth daily.     sitaGLIPtin 50 MG tablet  Commonly known as:  JANUVIA  Take 50 mg by mouth daily.     Vitamin D-3 1000 UNITS Caps  Take 1,000 Units by mouth daily.         CONSULTS:    Neurology, Physical Therapy   PROCEDURES PERFORMED:  Dg Chest 2 View  08/18/2014   CLINICAL DATA:  Syncope.  EXAM: CHEST  2 VIEW  COMPARISON:  None.  FINDINGS: The heart size and mediastinal contours are within normal limits. Both lungs are clear. No pneumothorax or pleural effusion is noted. Status post coronary artery bypass graft. Degenerative changes of lower lumbar spine are noted.  IMPRESSION: No acute cardiopulmonary abnormality seen.   Electronically Signed   By: Roque LiasJames  Green M.D.   On: 08/18/2014 12:39   Ct Head Wo Contrast  08/18/2014   CLINICAL DATA:  Near syncope.  Slurred speech  EXAM: CT HEAD WITHOUT CONTRAST  TECHNIQUE: Contiguous axial images were obtained from the base of the skull through the vertex without intravenous contrast.  COMPARISON:  None.  FINDINGS: Generalized atrophy.  Negative for  acute infarct.  Negative for hemorrhage or mass lesion.  Chronic sinusitis with mucosal thickening in the left sphenoid and ethmoid sinus. Mild mucosal edema in the maxillary sinus bilaterally. No acute bony abnormality.  IMPRESSION: Generalized atrophy.  No acute abnormality  Chronic sinusitis.   Electronically Signed   By: Marlan Palau M.D.   On: 08/18/2014 12:45   Mr Brain Wo Contrast  08/18/2014   CLINICAL DATA:  Sudden onset of slurred speech and drooling with altered mental status. There is some recovery but the patient is not a baseline. Syncope.  EXAM: MRI HEAD WITHOUT CONTRAST  TECHNIQUE: Multiplanar, multiecho pulse sequences of the brain and surrounding structures were obtained without intravenous contrast.  COMPARISON:  CT head without contrast 08/18/2014.  FINDINGS: The diffusion-weighted images demonstrate no evidence for acute or subacute infarction. No hemorrhage or  mass lesion is evident.  The left sphenoid sinus and posterior left ethmoid air cells are opacified with restricted diffusion suggesting acute infection.  Mild atrophy and white matter disease is potentially within normal limits for age. The ventricles are proportionate to the degree of atrophy.  Flow is present in the major intracranial arteries. The patient is status post left lens replacement. The globes and orbits are intact bilaterally. Mild mucosal thickening is present in the anterior ethmoid air cells and inferior frontal sinuses. There is fluid in the right mastoid air cells. No obstructing nasopharyngeal lesion is evident.  IMPRESSION: 1. No evidence for acute infarct. 2. Mild generalized atrophy and white matter disease, potentially within normal limits for age. 3. Posterior left ethmoid and sphenoid sinus disease with restricted diffusion compatible with acute sinusitis. There is likely a chronic component as well.   Electronically Signed   By: Gennette Pac M.D.   On: 08/18/2014 19:07       ADMISSION DATA: H&P: Mr. Torpey is a 80yo man, resident at a nursing home, w/ PMHx of dementia, HTN, HLD, CKD Stage III, Type 2 DM, and CAD s/p CABG who presented to the ED after a syncopal episode. Patient cannot recall what happened and therefore history was obtained through family members and medical records. Per records, patient was sitting in his chair, started to have slurred speech, began drooling on himself, and then was in and out of consciousness. This lasted approximately 20 minutes. Staff stated that he slumped down in his chair. He was brought to the hospital by EMS. Per family, patient is able to walk on his own and very talkative. Family notes patient was started on Seroquel two weeks ago. Family states he had an episode of syncope 3.5 years ago which was worked up and "everything came back normal." Family reports he had monitoring of his heart (Holter monitor?) which was normal as well. Family  denies any previous history of stroke. Patient notes some weakness in his legs, but does not seem to be new. Patient denies fever, chills, chest pain, abdominal pain, nausea, and dysuria.   In the ED, patient was evaluated by neuro. He received a CT Head which was negative. MRI Brain showed no evidence for acute infarct but did show acute sinusitis. He was also found to be hyperglycemic with blood glucose of 476.  Physical Exam: Blood pressure 144/64, pulse 77, temperature 98.4 F (36.9 C), temperature source Oral, resp. rate 18, SpO2 100.00%.  General: alert, sitting up in bed, NAD  HEENT: [place]/AT, EOMI, PERRL, sclera anicteric, mucus membranes moist  CV: RRR, normal S1/S2, 2/6 systolic murmur heard best at right sternal  border  Pulm: CTA bilaterally, breaths non-labored  Abd: BS+, soft, non-distended, non-tender  Ext: warm, moves all, no edema. Lower extremities have very dry skin.  Neuro: alert and oriented to person and hospital, speech fluent, CNs II-XII intact, strength 5/5 in upper and lower extremities bilaterally  Skin: Midline chest scar  Labs: Basic Metabolic Panel:   Recent Labs   08/18/14 1152   NA  135*   K  4.6   CL  95*   CO2  27   GLUCOSE  476*   BUN  24*   CREATININE  1.52*   CALCIUM  9.5    Liver Function Tests:   Recent Labs   08/18/14 1152   AST  19   ALT  21   ALKPHOS  63   BILITOT  0.3   PROT  7.3   ALBUMIN  3.6    CBC:   Recent Labs   08/18/14 1152   WBC  4.9   NEUTROABS  3.8   HGB  11.8*   HCT  34.5*   MCV  89.8   PLT  208    CBG:   Recent Labs   08/18/14 1442  08/18/14 1603  08/18/14 1618  08/18/14 1709   GLUCAP  331*  302*  291*  242*    Urinalysis:   Recent Labs   08/18/14 1550   COLORURINE  YELLOW   LABSPEC  1.022   PHURINE  7.0   GLUCOSEU  >1000*   HGBUR  NEGATIVE   BILIRUBINUR  NEGATIVE   KETONESUR  NEGATIVE   PROTEINUR  NEGATIVE   UROBILINOGEN  0.2   NITRITE  NEGATIVE   LEUKOCYTESUR  NEGATIVE     HOSPITAL  COURSE:  Syncope:  It is unclear wether the patient experienced true syncope or seizure.  No arrhythmias were identified, MRI did not reveal acute stroke and EEG was unremarkable.  Neurology did note that there is higher incidence of seizures in patients with dementia but did not feel that empiric anti-epileptic therapy was needed.  Etiology of this event is unclear.  The patient was at baseline mental status on the day following admission.  He had no further episodes.   PT evaluated him during admission and felt that he will need supervisions/24 hour assistance.  He is currently living in an assisted living facility.  His daughter was informed that SNF would be the best option for him, especially in light of his dementia.    The hospital CSW has spoken with the patient's ALF and they say they will be able to provide 24 hour supervision.   After speaking with Mr. Scherrie NovemberMcCall's daughter, she decided that it would be best for her father to return to his current ALF while she makes arrangements for SNF placement.  [place] but will be returning soon to start this process.  The patient's daughter was informed that [name] will need to follow-up with his PCP after discharge.  Dementia:  His daughter says he has never been evaluated by neurology and she has never been given a reason for his dementia.  I recommended that he follow-up with neuropsychiatry for evaluation after discharge.  She says seroquel 50mg  was recently started two weeks ago.  He has not required this medication in hospital.  Given its side effect profile, I will hold this medication at discharge and suggest PCP consider alternative.   Acute sinusitis:  Found on MRI.  Augmentin was  started and he will need to finish course after discharge (two more days of therapy).  Diabetes type 2:  Difficult to manage during admission.  Insulin was started.  The diabetes coordinator suggested Levemir 10 units nightly or 5 units BID.   Assisted living facility is unable to provide insulin so I will resume his oral diabetes medications at discharge.  I recommend he follow-up with his PCP for further management of his diabetes.  Once he transitions to SNF (where he will be able to get insulin) I would recommend starting insulin.   Hypertension:  Continue amlodipine.  Hold lisinopril and HCTZ at discharge.  Can resume lisinopril and, if needed HCTZ when renal function back to baseline.  He will need BMP at hospital follow-up.  DISCHARGE DATA: Vital Signs: BP 143/48  Pulse 67  Temp(Src) 98.2 F (36.8 C) (Oral)  Resp 16  Ht 5\' 11"  (1.803 m)  Wt 84.959 kg (187 lb 4.8 oz)  BMI 26.13 kg/m2  SpO2 100%  Labs: Results for orders placed during the hospital encounter of 08/18/14 (from the past 24 hour(s))  GLUCOSE, CAPILLARY     Status: Abnormal   Collection Time    08/19/14  9:44 PM      Result Value Ref Range   Glucose-Capillary 231 (*) 70 - 99 mg/dL  BASIC METABOLIC PANEL     Status: Abnormal   Collection Time    08/20/14  5:05 AM      Result Value Ref Range   Sodium 135 (*) 137 - 147 mEq/L   Potassium 3.9  3.7 - 5.3 mEq/L   Chloride 97  96 - 112 mEq/L   CO2 22  19 - 32 mEq/L   Glucose, Bld 201 (*) 70 - 99 mg/dL   BUN 23  6 - 23 mg/dL   Creatinine, Ser 1.61 (*) 0.50 - 1.35 mg/dL   Calcium 9.5  8.4 - 09.6 mg/dL   GFR calc non Af Amer 44 (*) >90 mL/min   GFR calc Af Amer 51 (*) >90 mL/min   Anion gap 16 (*) 5 - 15  GLUCOSE, CAPILLARY     Status: Abnormal   Collection Time    08/20/14  6:50 AM      Result Value Ref Range   Glucose-Capillary 236 (*) 70 - 99 mg/dL  GLUCOSE, CAPILLARY     Status: Abnormal   Collection Time    08/20/14 10:58 AM      Result Value Ref Range   Glucose-Capillary 231 (*) 70 - 99 mg/dL  GLUCOSE, CAPILLARY     Status: Abnormal   Collection Time    08/20/14  4:18 PM      Result Value Ref Range   Glucose-Capillary 267 (*) 70 - 99 mg/dL   Comment 1 Notify RN     Comment 2 Documented in  Chart       Services Ordered on Discharge: Y = Yes; Blank = No PT:   OT:   RN:   Equipment:   Other:      Time Spent on Discharge: 35 min   Signed: Evelena Peat PGY 2, Internal Medicine Resident 08/20/2014, 5:11 PM

## 2014-08-20 NOTE — Discharge Instructions (Signed)
1. Please follow-up with your primary care provider at least 1 week after discharge.   I have held your lisinopril and hydrochlorothiazide because your kidney function was low.  If your kidney function is stable and blood pressure is running high these medications can be continued.     2. Please take all medications as prescribed.    3. If you have worsening of your symptoms or new symptoms arise, please call the clinic [phone]), or go to the ER immediately if symptoms are severe.

## 2014-08-20 NOTE — Clinical Social Work Note (Signed)
Clinical Social Worker facilitated patient discharge including contacting patient family and facility to confirm patient discharge plans.  Clinical information faxed to facility and family agreeable with plan.  [facility] once discharge summary is completed.  RN to call report prior to discharge.  This Clinical Social Worker will sign off for now as social work intervention is no longer needed. Please consult us again if new need arises.  Derenda FennelBashira Camarion Weier, [place] [phone]

## 2014-08-20 NOTE — Progress Notes (Signed)
Inpatient Diabetes Program Recommendations  AACE/ADA: New Consensus Statement on Inpatient Glycemic Control (2013)  Target Ranges:  Prepandial:   less than 140 mg/dL      Peak postprandial:   less than 180 mg/dL (1-2 hours)      Critically ill patients:  140 - 180 mg/dL   Inpatient Diabetes Program Recommendations Insulin - Basal: Would recommend slight increae in basal insulin and change to levemir (which tends to work well with renal failure -Levemir 10 units at HS or 5 units bid  Thank you, Lenor CoffinAnn Teo Moede, RN, CNS, Diabetes Coordinator [phone])

## 2014-08-20 NOTE — Discharge Summary (Signed)
INTERNAL MEDICINE ATTENDING DISCHARGE COSIGN   I evaluated the patient on the day of discharge and discussed the discharge plan with my resident team. I agree with the discharge documentation and disposition.   Corene Resnick 08/20/2014, 8:51 PM

## 2014-08-20 NOTE — Progress Notes (Signed)
[facility]. Report called in to nurse.

## 2014-08-30 ENCOUNTER — Emergency Department (HOSPITAL_COMMUNITY)
Admission: EM | Admit: 2014-08-30 | Discharge: 2014-08-30 | Disposition: A | Discharge status: Home / Self Care | Payer: Medicare PPO | Attending: Emergency Medicine | Admitting: Emergency Medicine

## 2014-08-30 ENCOUNTER — Encounter (HOSPITAL_COMMUNITY): Payer: Self-pay | Admitting: Emergency Medicine

## 2014-08-30 DIAGNOSIS — M1990 Unspecified osteoarthritis, unspecified site: Secondary | ICD-10-CM | POA: Diagnosis not present

## 2014-08-30 DIAGNOSIS — Z951 Presence of aortocoronary bypass graft: Secondary | ICD-10-CM | POA: Insufficient documentation

## 2014-08-30 DIAGNOSIS — Z9889 Other specified postprocedural states: Secondary | ICD-10-CM | POA: Insufficient documentation

## 2014-08-30 DIAGNOSIS — N189 Chronic kidney disease, unspecified: Secondary | ICD-10-CM | POA: Diagnosis not present

## 2014-08-30 DIAGNOSIS — E780 Pure hypercholesterolemia: Secondary | ICD-10-CM | POA: Diagnosis not present

## 2014-08-30 DIAGNOSIS — Z720 Tobacco use: Secondary | ICD-10-CM | POA: Diagnosis not present

## 2014-08-30 DIAGNOSIS — E860 Dehydration: Secondary | ICD-10-CM | POA: Diagnosis not present

## 2014-08-30 DIAGNOSIS — Z7982 Long term (current) use of aspirin: Secondary | ICD-10-CM | POA: Diagnosis not present

## 2014-08-30 DIAGNOSIS — F0390 Unspecified dementia without behavioral disturbance: Secondary | ICD-10-CM | POA: Diagnosis not present

## 2014-08-30 DIAGNOSIS — Z79899 Other long term (current) drug therapy: Secondary | ICD-10-CM | POA: Insufficient documentation

## 2014-08-30 DIAGNOSIS — I129 Hypertensive chronic kidney disease with stage 1 through stage 4 chronic kidney disease, or unspecified chronic kidney disease: Secondary | ICD-10-CM | POA: Diagnosis not present

## 2014-08-30 DIAGNOSIS — E119 Type 2 diabetes mellitus without complications: Secondary | ICD-10-CM | POA: Insufficient documentation

## 2014-08-30 DIAGNOSIS — R55 Syncope and collapse: Secondary | ICD-10-CM | POA: Diagnosis present

## 2014-08-30 LAB — URINE MICROSCOPIC-ADD ON

## 2014-08-30 LAB — BASIC METABOLIC PANEL
Anion gap: 15 (ref 5–15)
BUN: 27 mg/dL — AB (ref 6–23)
CALCIUM: 9.5 mg/dL (ref 8.4–10.5)
CO2: 25 mEq/L (ref 19–32)
Chloride: 98 mEq/L (ref 96–112)
Creatinine, Ser: 1.35 mg/dL (ref 0.50–1.35)
GFR calc Af Amer: 56 mL/min — ABNORMAL LOW (ref >90)
GFR calc non Af Amer: 48 mL/min — ABNORMAL LOW (ref >90)
GLUCOSE: 292 mg/dL — AB (ref 70–99)
Potassium: 4.6 mEq/L (ref 3.7–5.3)
Sodium: 138 mEq/L (ref 137–147)

## 2014-08-30 LAB — URINALYSIS, ROUTINE W REFLEX MICROSCOPIC
Bilirubin Urine: NEGATIVE
Glucose, UA: >1000 mg/dL — AB
KETONES UR: 15 mg/dL — AB
Leukocytes, UA: NEGATIVE
Nitrite: NEGATIVE
PROTEIN: NEGATIVE mg/dL
Specific Gravity, Urine: 1.024 (ref 1.005–1.030)
Urobilinogen, UA: 0.2 mg/dL (ref 0.0–1.0)
pH: 5 (ref 5.0–8.0)

## 2014-08-30 LAB — CBC
HEMATOCRIT: 34.5 % — AB (ref 39.0–52.0)
HEMOGLOBIN: 11.7 g/dL — AB (ref 13.0–17.0)
MCH: 30.9 pg (ref 26.0–34.0)
MCHC: 33.9 g/dL (ref 30.0–36.0)
MCV: 91 fL (ref 78.0–100.0)
Platelets: 186 10*3/uL (ref 150–400)
RBC: 3.79 MIL/uL — ABNORMAL LOW (ref 4.22–5.81)
RDW: 13.4 % (ref 11.5–15.5)
WBC: 5.4 10*3/uL (ref 4.0–10.5)

## 2014-08-30 NOTE — ED Provider Notes (Signed)
Pt here from NH for concern for altered mental status Pt is awake/alert, no distress and he has no complaints He had recent inpatient evaluations Labs pending at this time    EKG Interpretation  Date/Time:  Monday August 30 2014 12:44:30 EST Ventricular Rate:  82 PR Interval:  164 QRS Duration: 107 QT Interval:  408 QTC Calculation: 476 R Axis:   7 Text Interpretation:  Sinus rhythm Minimal ST elevation, anterior leads Borderline prolonged QT interval artifact noted No significant change since last tracing Confirmed by Bebe ShaggyWICKLINE  MD, Ivett Luebbe ([id number]) on 08/30/2014 1:14:05 PM        Todd Gaskinsonald W Sarrinah Gardin, MD 08/30/14 1409

## 2014-08-30 NOTE — Discharge Instructions (Signed)
Dehydration Dehydration is when you lose more fluids from the body than you take in. Vital organs such as the kidneys, brain, and heart cannot function without a proper amount of fluids and salt. Any loss of fluids from the body can cause dehydration.  Older adults are at a higher risk of dehydration than younger adults. As we age, our bodies are less able to conserve water and do not respond to temperature changes as well. Also, older adults do not become thirsty as easily or quickly. Because of this, older adults often do not realize they need to increase fluids to avoid dehydration.  CAUSES   Vomiting.  Diarrhea.  Excessive sweating.  Excessive urination.  Fever.  Certain medicines, such as blood pressure medicines called diuretics.  Poorly controlled blood sugars. SIGNS AND SYMPTOMS  Mild dehydration:  Thirst.  Dry lips.  Slightly dry mouth. Moderate dehydration:  Very dry mouth.  Sunken eyes.  Skin does not bounce back quickly when lightly pinched and released.  Dark urine and decreased urine production.  Decreased tear production.  Headache. Severe dehydration:  Very dry mouth.  Extreme thirst.  Rapid, weak pulse (more than 100 beats per minute at rest).  Cold hands and feet.  Not able to sweat in spite of heat.  Rapid breathing.  Blue lips.  Confusion and lethargy.  Difficulty being awakened.  Minimal urine production.  No tears. DIAGNOSIS  Your health care provider will diagnose dehydration based on your symptoms and your exam. Blood and urine tests will help confirm the diagnosis. The diagnostic evaluation should also identify the cause of dehydration. TREATMENT  Treatment of mild or moderate dehydration can often be done at home by increasing the amount of fluids that you drink. It is best to drink small amounts of fluid more often. Drinking too much at one time can make vomiting worse. Severe dehydration needs to be treated at the hospital.  You may be given IV fluids that contain water and electrolytes. HOME CARE INSTRUCTIONS   Ask your health care provider about specific rehydration instructions.  Drink enough fluids to keep your urine clear or pale yellow.  Drink small amounts frequently if you have nausea and vomiting.  Eat as you normally do.  Avoid:  Foods or drinks high in sugar.  Carbonated drinks.  Juice.  Extremely hot or cold fluids.  Drinks with caffeine.  Fatty, greasy foods.  Alcohol.  Tobacco.  Overeating.  Gelatin desserts.  Wash your hands well to avoid spreading bacteria and viruses.  Only take over-the-counter or prescription medicines for pain, discomfort, or fever as directed by your health care provider.  Ask your health care provider if you should continue all prescribed and over-the-counter medicines.  Keep all follow-up appointments with your health care provider. SEEK MEDICAL CARE IF:  You have abdominal pain, and it increases or stays in one area (localizes).  You have a rash, stiff neck, or severe headache.  You are irritable, sleepy, or difficult to awaken.  You are weak, dizzy, or extremely thirsty.  You have a fever. SEEK IMMEDIATE MEDICAL CARE IF:   You are unable to keep fluids down, or you get worse despite treatment.  You have frequent episodes of vomiting or diarrhea.  You have blood or green matter (bile) in your vomit.  You have blood in your stool, or your stool looks black and tarry.  You have not urinated in 6-8 hours, or you have only urinated a small amount of very dark urine.    You faint. MAKE SURE YOU:   Understand these instructions.  Will watch your condition.  Will get help right away if you are not doing well or get worse. Document Released: 01/05/2004 Document Revised: 10/20/2013 Document Reviewed: 06/22/2013 Exit[employer] Patient Information 2015 [employer]. This information is not intended to replace advice given to you by your  health care provider. Make sure you discuss any questions you have with your health care provider.  

## 2014-08-30 NOTE — ED Notes (Signed)
[facility] from [facility] via [facility].  Staff reports finding pt "unresponsive" sitting in a chair.  Staff moved pt to floor and on EMS arrival pt alert and oriented at baseline.  Hx of memory loss.  Pt denies pain or any complaints, in NAD, alert.

## 2014-08-30 NOTE — ED Notes (Signed)
Attempted blood draw x 1  

## 2014-08-30 NOTE — ED Provider Notes (Signed)
CSN: [id number]     Arrival date & time 08/30/14  1218 History   First MD Initiated Contact with Patient 08/30/14 1219     Chief Complaint  Patient presents with  . Loss of Consciousness     (Consider location/radiation/quality/duration/timing/severity/associated sxs/prior Treatment) HPI Comments: This is an 80 year old male with a past medical history of dementia, chronic kidney disease, type 2 diabetes and hypertension who presents to the emergency department via EMS [facility] with concerns of a syncopal episode. Per EMS, patient was noted to be "unresponsive" sitting in his chair when he went to get him for lunch. Earlier in the morning patient went through his therapy session normally. On EMS arrival, patient was alert and oriented at his baseline with normal vital signs. EMS was told the staff was talking about starting CPR despite pt being DNR. Pt denies any complaints at this time. Level V caveat due to dementia.  Patient is a 80 y.o. male presenting with syncope. The history is provided by the EMS personnel and a caregiver.  Loss of Consciousness   Past Medical History  Diagnosis Date  . Elevated cholesterol   . Hypertension   . Type II diabetes mellitus   . Dementia     "don't know stage or type" (08/18/2014)  . Arthritis     "probably"  . Chronic kidney disease     "? stage" (08/18/2014)   Past Surgical History  Procedure Laterality Date  . Tonsillectomy    . Coronary artery bypass graft  ?1994    "CABG X3"  . Cataract extraction     No family history on file. History  Substance Use Topics  . Smoking status: Former Smoker    Types: Pipe  . Smokeless tobacco: Never Used     Comment: "quit smoking a pipe in the 80's"  . Alcohol Use: No    Review of Systems  Unable to perform ROS: Dementia  Cardiovascular: Positive for syncope.      Allergies  Review of patient's allergies indicates no known allergies.  Home  Medications   Prior to Admission medications   Medication Sig Start Date End Date Taking? Authorizing Provider  amLODipine (NORVASC) 10 MG tablet Take 10 mg by mouth daily.    Historical Provider, MD  aspirin EC 325 MG tablet Take 325 mg by mouth daily.    Historical Provider, MD  atorvastatin (LIPITOR) 10 MG tablet Take 10 mg by mouth daily.    Historical Provider, MD  Cholecalciferol (VITAMIN D-3) 1000 UNITS CAPS Take 1,000 Units by mouth daily.    Historical Provider, MD  glimepiride (AMARYL) 2 MG tablet Take 2 mg by mouth 2 (two) times daily.    Historical Provider, MD  pioglitazone (ACTOS) 30 MG tablet Take 30 mg by mouth daily.    Historical Provider, MD  sitaGLIPtin (JANUVIA) 50 MG tablet Take 50 mg by mouth daily.    Historical Provider, MD   There were no vitals taken for this visit. Physical Exam  Constitutional: He appears well-developed and well-nourished. No distress.  Pleasant, talkative.  HENT:  Head: Normocephalic and atraumatic.  Mouth/Throat: Oropharynx is clear and moist.  Eyes: Conjunctivae are normal.  Neck: Normal range of motion. Neck supple.  Cardiovascular: Normal rate, regular rhythm and normal heart sounds.   Pulmonary/Chest: Effort normal and breath sounds normal.  Abdominal: Soft. Bowel sounds are normal. There is no tenderness.  Musculoskeletal: Normal range of motion. He exhibits no edema.  Neurological:  He is alert.  Oriented to person and place, not time. Moves limbs without ataxia. Speech fluent, goal oriented. Equal grip strength bilateral.  Skin: Skin is warm and dry. He is not diaphoretic.  Psychiatric: He has a normal mood and affect. His behavior is normal.  Nursing note and vitals reviewed.   ED Course  Procedures (including critical care time) Labs Review Labs Reviewed  CBC - Abnormal; Notable for the following:    RBC 3.79 (*)    Hemoglobin 11.7 (*)    HCT 34.5 (*)    All other components within normal limits  BASIC METABOLIC PANEL  - Abnormal; Notable for the following:    Glucose, Bld 292 (*)    BUN 27 (*)    GFR calc non Af Amer 48 (*)    GFR calc Af Amer 56 (*)    All other components within normal limits  URINALYSIS, ROUTINE W REFLEX MICROSCOPIC - Abnormal; Notable for the following:    Glucose, UA >1000 (*)    Hgb urine dipstick SMALL (*)    Ketones, ur 15 (*)    All other components within normal limits  URINE MICROSCOPIC-ADD ON - Abnormal; Notable for the following:    Casts HYALINE CASTS (*)    All other components within normal limits    Imaging Review    EKG Interpretation   Date/Time:  Monday August 30 2014 12:44:30 EST Ventricular Rate:  82 PR Interval:  164 QRS Duration: 107 QT Interval:  408 QTC Calculation: 476 R Axis:   7 Text Interpretation:  Sinus rhythm Minimal ST elevation, anterior leads  Borderline prolonged QT interval artifact noted No significant change  since last tracing Confirmed by Bebe ShaggyWICKLINE  MD, DONALD ([id number]) on 08/30/2014  1:14:05 PM      MDM   Final diagnoses:  Dehydration   Pt well appearing and in NAD. AFVSS. No hypotension. No complaints. At baseline mentation. I spoke with Mardella LaymanLindsey, nursing staff member at [facility] who states pt went to his morning therapy, did fine, however when they went to go get him for lunch, he was "unresponsive" in his chair for a few minutes. They lowered him to the floor and were going to start CPR until he started to grip someone's hand. He was breathing and had pulses the entire time.  Pt admitted on 10/21 for syncope, had an echo and MR brain without acute findings. What the nursing facility is describing sounds almost as if patient was sleeping. Plan to check basic labs, UA and EKG.  Labs without any acute finding. UA with small amount of ketones, most likely dehydration. Alert and oriented at baseline. Vitals remain stable. Stable for d/c back to facility.  Discussed with attending Dr. Bebe ShaggyWickline who also evaluated  patient and agrees with plan of care.   Kathrynn SpeedRobyn M Chetan Mehring, PA-C 08/31/14 1014  Kathrynn Speedobyn M Lashea Goda, PA-C 08/31/14 1014  Kathrynn Speedobyn M Alvin Diffee, PA-C 08/31/14 1015  Kathrynn SpeedRobyn M Taeler Winning, PA-C 08/31/14 2002  Joya Gaskinsonald W Wickline, MD 09/02/14 1336

## 2014-08-31 ENCOUNTER — Encounter (HOSPITAL_COMMUNITY): Payer: Self-pay | Admitting: Emergency Medicine

## 2014-08-31 ENCOUNTER — Observation Stay (HOSPITAL_COMMUNITY)
Admission: EM | Admit: 2014-08-31 | Discharge: 2014-09-01 | Disposition: A | Discharge status: Home / Self Care | Payer: Medicare PPO | Attending: Oncology | Admitting: Oncology

## 2014-08-31 ENCOUNTER — Emergency Department (HOSPITAL_COMMUNITY): Payer: Medicare PPO

## 2014-08-31 DIAGNOSIS — Z7982 Long term (current) use of aspirin: Secondary | ICD-10-CM | POA: Diagnosis not present

## 2014-08-31 DIAGNOSIS — E118 Type 2 diabetes mellitus with unspecified complications: Secondary | ICD-10-CM

## 2014-08-31 DIAGNOSIS — E780 Pure hypercholesterolemia: Secondary | ICD-10-CM | POA: Insufficient documentation

## 2014-08-31 DIAGNOSIS — E119 Type 2 diabetes mellitus without complications: Secondary | ICD-10-CM | POA: Diagnosis not present

## 2014-08-31 DIAGNOSIS — E1165 Type 2 diabetes mellitus with hyperglycemia: Secondary | ICD-10-CM

## 2014-08-31 DIAGNOSIS — I10 Essential (primary) hypertension: Secondary | ICD-10-CM | POA: Diagnosis present

## 2014-08-31 DIAGNOSIS — IMO0002 Reserved for concepts with insufficient information to code with codable children: Secondary | ICD-10-CM

## 2014-08-31 DIAGNOSIS — R55 Syncope and collapse: Principal | ICD-10-CM | POA: Insufficient documentation

## 2014-08-31 DIAGNOSIS — F0390 Unspecified dementia without behavioral disturbance: Secondary | ICD-10-CM | POA: Insufficient documentation

## 2014-08-31 DIAGNOSIS — N189 Chronic kidney disease, unspecified: Secondary | ICD-10-CM | POA: Diagnosis not present

## 2014-08-31 DIAGNOSIS — E860 Dehydration: Secondary | ICD-10-CM | POA: Diagnosis not present

## 2014-08-31 DIAGNOSIS — N183 Chronic kidney disease, stage 3 unspecified: Secondary | ICD-10-CM | POA: Diagnosis present

## 2014-08-31 DIAGNOSIS — Z87891 Personal history of nicotine dependence: Secondary | ICD-10-CM | POA: Insufficient documentation

## 2014-08-31 DIAGNOSIS — M1990 Unspecified osteoarthritis, unspecified site: Secondary | ICD-10-CM | POA: Insufficient documentation

## 2014-08-31 DIAGNOSIS — Z79899 Other long term (current) drug therapy: Secondary | ICD-10-CM | POA: Insufficient documentation

## 2014-08-31 DIAGNOSIS — F0150 Vascular dementia without behavioral disturbance: Secondary | ICD-10-CM | POA: Diagnosis present

## 2014-08-31 DIAGNOSIS — I129 Hypertensive chronic kidney disease with stage 1 through stage 4 chronic kidney disease, or unspecified chronic kidney disease: Secondary | ICD-10-CM | POA: Diagnosis not present

## 2014-08-31 DIAGNOSIS — R4182 Altered mental status, unspecified: Secondary | ICD-10-CM | POA: Diagnosis present

## 2014-08-31 DIAGNOSIS — Z951 Presence of aortocoronary bypass graft: Secondary | ICD-10-CM

## 2014-08-31 LAB — I-STAT CHEM 8, ED
BUN: 26 mg/dL — AB (ref 6–23)
CALCIUM ION: 1.21 mmol/L (ref 1.13–1.30)
CHLORIDE: 98 meq/L (ref 96–112)
Creatinine, Ser: 1.5 mg/dL — ABNORMAL HIGH (ref 0.50–1.35)
Glucose, Bld: 294 mg/dL — ABNORMAL HIGH (ref 70–99)
HCT: 38 % — ABNORMAL LOW (ref 39.0–52.0)
Hemoglobin: 12.9 g/dL — ABNORMAL LOW (ref 13.0–17.0)
Potassium: 4.2 mEq/L (ref 3.7–5.3)
Sodium: 138 mEq/L (ref 137–147)
TCO2: 24 mmol/L (ref 0–100)

## 2014-08-31 LAB — GLUCOSE, CAPILLARY
GLUCOSE-CAPILLARY: 220 mg/dL — AB (ref 70–99)
Glucose-Capillary: 271 mg/dL — ABNORMAL HIGH (ref 70–99)

## 2014-08-31 LAB — I-STAT TROPONIN, ED: TROPONIN I, POC: 0.04 ng/mL (ref 0.00–0.08)

## 2014-08-31 MED ORDER — SODIUM CHLORIDE 0.9 % IJ SOLN
3.0000 mL | Freq: Two times a day (BID) | INTRAMUSCULAR | Status: DC
  Administered 2014-08-31: 3 mL via INTRAVENOUS

## 2014-08-31 MED ORDER — ACETAMINOPHEN 325 MG PO TABS: 650.0000 mg | ORAL_TABLET | Freq: Four times a day (QID) | ORAL | Status: DC | PRN

## 2014-08-31 MED ORDER — ATORVASTATIN CALCIUM 10 MG PO TABS
10.0000 mg | ORAL_TABLET | Freq: Every day | ORAL | Status: DC
  Administered 2014-08-31: 10 mg via ORAL
  Filled 2014-08-31: qty 1

## 2014-08-31 MED ORDER — INSULIN ASPART 100 UNIT/ML ~~LOC~~ SOLN
0.0000 [IU] | Freq: Three times a day (TID) | SUBCUTANEOUS | Status: DC
  Administered 2014-08-31: 3 [IU] via SUBCUTANEOUS
  Administered 2014-09-01: 7 [IU] via SUBCUTANEOUS
  Administered 2014-09-01: 5 [IU] via SUBCUTANEOUS

## 2014-08-31 MED ORDER — GLIMEPIRIDE 1 MG PO TABS: 2.0000 mg | ORAL_TABLET | Freq: Two times a day (BID) | ORAL | Status: DC

## 2014-08-31 MED ORDER — HEPARIN SODIUM (PORCINE) 5000 UNIT/ML IJ SOLN
5000.0000 [IU] | Freq: Three times a day (TID) | INTRAMUSCULAR | Status: DC
  Administered 2014-08-31 – 2014-09-01 (×3): 5000 [IU] via SUBCUTANEOUS
  Filled 2014-08-31 (×3): qty 1

## 2014-08-31 MED ORDER — AMLODIPINE BESYLATE 10 MG PO TABS
10.0000 mg | ORAL_TABLET | Freq: Every day | ORAL | Status: DC
  Administered 2014-09-01: 10 mg via ORAL
  Filled 2014-08-31 (×2): qty 1

## 2014-08-31 MED ORDER — LINAGLIPTIN 5 MG PO TABS: 5.0000 mg | ORAL_TABLET | Freq: Every day | ORAL | Status: DC

## 2014-08-31 MED ORDER — PIOGLITAZONE HCL 30 MG PO TABS: 30.0000 mg | ORAL_TABLET | Freq: Every day | ORAL | Status: DC

## 2014-08-31 MED ORDER — QUETIAPINE FUMARATE 50 MG PO TABS
50.0000 mg | ORAL_TABLET | Freq: Two times a day (BID) | ORAL | Status: DC
  Administered 2014-08-31 – 2014-09-01 (×2): 50 mg via ORAL
  Filled 2014-08-31 (×2): qty 1

## 2014-08-31 MED ORDER — VITAMIN B-12 1000 MCG PO TABS
1000.0000 ug | ORAL_TABLET | Freq: Every day | ORAL | Status: DC
  Administered 2014-09-01: 1000 ug via ORAL
  Filled 2014-08-31: qty 1

## 2014-08-31 MED ORDER — ACETAMINOPHEN 650 MG RE SUPP: 650.0000 mg | Freq: Four times a day (QID) | RECTAL | Status: DC | PRN

## 2014-08-31 MED ORDER — ONDANSETRON HCL 4 MG PO TABS: 4.0000 mg | ORAL_TABLET | Freq: Four times a day (QID) | ORAL | Status: DC | PRN

## 2014-08-31 MED ORDER — ASPIRIN EC 325 MG PO TBEC
325.0000 mg | DELAYED_RELEASE_TABLET | Freq: Every day | ORAL | Status: DC
  Administered 2014-09-01: 325 mg via ORAL
  Filled 2014-08-31: qty 1

## 2014-08-31 MED ORDER — LISINOPRIL 10 MG PO TABS
10.0000 mg | ORAL_TABLET | Freq: Every day | ORAL | Status: DC
  Administered 2014-09-01: 10 mg via ORAL
  Filled 2014-08-31: qty 1

## 2014-08-31 MED ORDER — HYDROCHLOROTHIAZIDE 25 MG PO TABS
25.0000 mg | ORAL_TABLET | Freq: Every day | ORAL | Status: DC
  Administered 2014-09-01: 25 mg via ORAL
  Filled 2014-08-31: qty 1

## 2014-08-31 MED ORDER — ONDANSETRON HCL 4 MG/2ML IJ SOLN: 4.0000 mg | Freq: Four times a day (QID) | INTRAMUSCULAR | Status: DC | PRN

## 2014-08-31 NOTE — ED Notes (Signed)
To ED via [facility] from [facility] with c/o found slumped over at breakfast table, after walking self to table with walker--- staff lowered him to floor, was on floor when EMS arrived. Pt was responsive at time of EMS arrival. Was not cooperative with EMS, on arrival, pt responsive, to verbal stimuli-- has slight right facial droop, will move all extremeties with encouragement.

## 2014-08-31 NOTE — ED Provider Notes (Signed)
CSN: [id number]     Arrival date & time 08/31/14  0900 History   First MD Initiated Contact with Patient 08/31/14 0902     Chief Complaint  Patient presents with  . Altered Mental Status    LEVEL 5 CAVEAT - DEMENTIA  Patient is a 80 y.o. male presenting with altered mental status. The history is provided by the EMS personnel and the patient. The history is limited by the condition of the patient.  Altered Mental Status Presenting symptoms: partial responsiveness   Severity:  Moderate Most recent episode:  Today Progression:  Improving Patient presents from nursing facility Per report, pt went to dining hall in his usual state of health, and while sitting down he appeared unresponsive.  Per nursing report he "appeared green" and he "was drooling" He did not require CPR He began to improve spontaneously He had a similar episode yesterday and was evaluated in the ER at that time No seizure reported today No med changes  Pt is a DNR   Past Medical History  Diagnosis Date  . Elevated cholesterol   . Hypertension   . Type II diabetes mellitus   . Dementia     "don't know stage or type" (08/18/2014)  . Arthritis     "probably"  . Chronic kidney disease     "? stage" (08/18/2014)   Past Surgical History  Procedure Laterality Date  . Tonsillectomy    . Coronary artery bypass graft  ?1994    "CABG X3"  . Cataract extraction     No family history on file. History  Substance Use Topics  . Smoking status: Former Smoker    Types: Pipe  . Smokeless tobacco: Never Used     Comment: "quit smoking a pipe in the 80's"  . Alcohol Use: No    Review of Systems  Unable to perform ROS: Dementia      Allergies  Review of patient's allergies indicates no known allergies.  Home Medications   Prior to Admission medications   Medication Sig Start Date End Date Taking? Authorizing Provider  amLODipine (NORVASC) 10 MG tablet Take 10 mg by mouth daily.    Historical Provider, MD   aspirin EC 325 MG tablet Take 325 mg by mouth daily.    Historical Provider, MD  atorvastatin (LIPITOR) 10 MG tablet Take 10 mg by mouth at bedtime.     Historical Provider, MD  Cholecalciferol (VITAMIN D-3) 1000 UNITS CAPS Take 1,000 Units by mouth daily.    Historical Provider, MD  glimepiride (AMARYL) 2 MG tablet Take 2 mg by mouth 2 (two) times daily.    Historical Provider, MD  hydrochlorothiazide (HYDRODIURIL) 25 MG tablet Take 25 mg by mouth daily.    Historical Provider, MD  lisinopril (PRINIVIL,ZESTRIL) 10 MG tablet Take 10 mg by mouth daily.    Historical Provider, MD  pioglitazone (ACTOS) 30 MG tablet Take 30 mg by mouth daily.    Historical Provider, MD  QUEtiapine (SEROQUEL) 50 MG tablet Take 50 mg by mouth 2 (two) times daily.    Historical Provider, MD  sitaGLIPtin (JANUVIA) 50 MG tablet Take 50 mg by mouth daily.    Historical Provider, MD  vitamin B-12 (CYANOCOBALAMIN) 1000 MCG tablet Take 1,000 mcg by mouth daily.    Historical Provider, MD   BP 148/61 mmHg  Pulse 85  Temp(Src) 98.2 F (36.8 C)  Resp 16  SpO2 100% Physical Exam CONSTITUTIONAL: elderly, no distress noted HEAD: Normocephalic/atraumatic EYES: EOMI/PERRL ENMT: Mucous membranes  moist NECK: supple no meningeal signs SPINE:entire spine nontender CV: S1/S2 noted LUNGS: Lungs are clear to auscultation bilaterally, no apparent distress ABDOMEN: soft, nontender, no rebound or guarding NEURO: Pt is awake/alert, he moves his upper extremities without difficulty.  No facial droop.  He does not want to move his lower extremities (EMS reports patient was freely moving legs prior to arrival in ER) EXTREMITIES: pulses normal, full ROM SKIN: warm, color normal PSYCH: no abnormalities of mood noted  ED Course  Procedures  9:47 AM PT PRESENTS FOR EPISODE OF UNRESPONSIVENESS HE WAS SEE IN ED YESTERDAY FOR SIMILAR EPISODE HE HAD RECENT INPATIENT WORKUP INCLUDING MRI BRAIN/EEG/ECHO PT CURRENTLY AWAKE/ALERT NO SIGNS  OF ACUTE CVA AT THIS TIME PER REPORT FROM [facility] (I CALLED THEM) HE APPEARED "GREEN" AND WAS "DROOLING" WILL START WITH CT HEAD/LABS 10:43 AM Pt awake/alert, no distress, watching TV Pt was sent for evaluation for recurrent unresponsive episodes Will consult case management for further guidance on placement (pt currently at ALF) 12:16 PM Pt stable, no distress SW to see patient ([phone]) Daughter has also called and would like to be involved [name]([name] - [phone]) 1:03 PM D/w daughter Will admit for further evaluation for recurrent syncope D/w social work as well 1:12 PM D/w dr Grant Rutssaadiq, internal medicine Will admit to tele monitor for recurrent syncope  Labs Review Labs Reviewed  I-STAT CHEM 8, ED - Abnormal; Notable for the following:    BUN 26 (*)    Creatinine, Ser 1.50 (*)    Glucose, Bld 294 (*)    Hemoglobin 12.9 (*)    HCT 38.0 (*)    All other components within normal limits  I-STAT TROPOININ, ED    Imaging Review Ct Head Wo Contrast  08/31/2014   CLINICAL DATA:  Altered mental status. The patient became unresponsive while eating his breakfast. He was response to verbal stimuli. There is a slight right facial droop. He is able to move all extremities.  EXAM: CT HEAD WITHOUT CONTRAST  TECHNIQUE: Contiguous axial images were obtained from the base of the skull through the vertex without intravenous contrast.  COMPARISON:  MRI brain and CT head 08/18/2014.  FINDINGS: Mild atrophy and white matter disease is unchanged from the previous studies. No acute cortical infarct, hemorrhage, or mass lesion is evident. The basal ganglia are intact. Insular ribbon is intact. The ventricles are proportionate to the degree of atrophy. The basal cisterns are patent. No significant extraaxial fluid collection is present.  There is scattered opacification of ethmoid air cells, particularly posteriorly on the left and anteriorly on the right. The left sphenoid sinus is opacified. Fluid or  soft tissue is noted at the left sphenoidal recess. The remaining paranasal sinuses and the mastoid air cells are clear.  IMPRESSION: 1. Stable appearance of atrophy and white matter disease without evidence for acute infarct, hemorrhage, or mass lesion. 2. Is stable sinus disease, most pronounced in the posterior left ethmoid air cells and left sphenoid sinus.   Electronically Signed   By: Gennette Pachris  Mattern M.D.   On: 08/31/2014 10:02     EKG Interpretation   Date/Time:  Tuesday August 31 2014 09:22:38 EST Ventricular Rate:  90 PR Interval:  166 QRS Duration: 101 QT Interval:  385 QTC Calculation: 471 R Axis:   -24 Text Interpretation:  Sinus rhythm Probable left atrial enlargement  Borderline left axis deviation Artifact in lead(s) I III aVR aVL aVF V1 V2  V5 V6 and baseline wander in lead(s) V3 artifact makes  interpretation  difficult Confirmed by Bebe ShaggyWICKLINE  MD, Hudsen Fei ([id number]) on 08/31/2014 9:41:40  AM      MDM   Final diagnoses:  Dehydration  Syncope and collapse    Nursing notes including past medical history and social history reviewed and considered in documentation Labs/vital reviewed and considered Previous records reviewed and considered     Joya Gaskinsonald W Red Mandt, MD 08/31/14 1314

## 2014-08-31 NOTE — Progress Notes (Signed)
Occupational Therapy Evaluation:  Pt admitted with syncopal episode.   He has h/o dementia.  He presents to OT with mild dysmetria Lt. UE.  He requires min A for BADLs.   Feel he will benefit from SNF level rehab at discharge.  Will follow acutely.     08/31/14 1529  OT Visit Information  Last OT Received On 08/31/14  Assistance Needed +1  PT/OT/SLP Co-Evaluation/Treatment Yes  Reason for Co-Treatment For patient/therapist safety  History of Present Illness pt presents with syncopal episode and hx of recent admit for the same.  pt has hx of Dementia.    Precautions  Precautions Fall  Home Living  Family/patient expects to be discharged to: Skilled nursing facility  Home Equipment Walker - 2 wheels  Additional Comments [facility] resides at [facility].    Prior Function  Level of Independence Independent with assistive device(s)  Comments Unclear PLOF as pt unable to provide information.    Communication  Communication No difficulties  Pain Assessment  Pain Assessment No/denies pain  Cognition  Arousal/Alertness Awake/alert  Behavior During Therapy WFL for tasks assessed/performed  Overall Cognitive Status History of cognitive impairments - at baseline  Upper Extremity Assessment  Upper Extremity Assessment LUE deficits/detail  LUE Deficits / Details Pt with mild dysmetria Lt. UE  LUE Coordination decreased gross motor;decreased fine motor  Lower Extremity Assessment  Lower Extremity Assessment Defer to PT evaluation  Cervical / Trunk Assessment  Cervical / Trunk Assessment Normal  ADL  Overall ADL's  Needs assistance/impaired  Eating/Feeding Modified independent;Sitting  Grooming Wash/dry hands;Wash/dry face;Brushing hair;Minimal assistance;Standing  Upper Body Bathing Supervision/ safety;Sitting  Lower Body Bathing Minimal assistance;Sit to/from stand  Upper Body Dressing  Moderate assistance;Sitting  Upper Body Dressing Details (indicate cue type and reason) Difficulty  donning zip front jacket  Lower Body Dressing Minimal assistance;Sit to/from stand  Lower Body Dressing Details (indicate cue type and reason) Increased effort - HR increased to 125 while attempting to don socks  Toilet Transfer Ambulation;Comfort height toilet;Min guard  Toileting- Clothing Manipulation and Hygiene Minimal assistance;Sit to/from stand  Functional mobility during ADLs Minimal assistance;Rolling walker  General ADL Comments Pt requires assist with ADLs due to  balance and cognitive deficits   Vision- History  Baseline Vision/History Wears glasses  Wears Glasses At all times  Patient Visual Report No change from baseline  Vision- Assessment  Vision Assessment? Yes  Ocular Range of Motion [facility]  Tracking/Visual Pursuits Requires cues, head turns, or add eye shifts to track  Visual Fields No apparent deficits  Bed Mobility  Overal bed mobility Needs Assistance  Bed Mobility Supine to Sit;Sit to Supine  Supine to sit Supervision  Sit to supine Supervision  General bed mobility comments pt uses momentum to A with mobility.    Transfers  Overall transfer level Needs assistance  Equipment used Rolling walker (2 wheeled)  Transfers Sit to/from BJ'sStand;Stand Pivot Transfers  Sit to Stand Supervision  Stand pivot transfers Supervision  General transfer comment cues for safety as pt tends to "park" RW off to the side.    Balance  Overall balance assessment Needs assistance  Sitting-balance support Feet supported  Sitting balance-Leahy Scale Fair  Sitting balance - Comments pt occasionally needs to use a single UE or leans against bed to additional balance support when attempting to don/doff socks.  Standing balance support Bilateral upper extremity supported  Standing balance-Leahy Scale Fair  OT - End of Session  Equipment Utilized During Treatment Gait belt;Rolling walker  Activity  Tolerance Patient tolerated treatment well  Patient left in bed;with call bell/phone within  reach  Nurse Communication Mobility status  OT Assessment  OT Therapy Diagnosis  Generalized weakness;Cognitive deficits  OT Recommendation/Assessment Patient needs continued OT Services  OT Problem List Decreased strength;Decreased activity tolerance;Impaired balance (sitting and/or standing);Decreased coordination;Decreased knowledge of use of DME or AE  Barriers to Discharge Decreased caregiver support  OT Plan  OT Frequency Min 2X/week  OT Treatment/Interventions Self-care/ADL training;DME and/or AE instruction;Therapeutic activities;Patient/family education;Balance training;Cognitive remediation/compensation  OT Recommendation  Follow Up Recommendations SNF  OT Equipment None recommended by OT  Individuals Consulted  Consulted and Agree with Results and Recommendations Patient unable/family or caregiver not available  Acute Rehab OT Goals  OT Goal Formulation Patient unable to participate in goal setting  Time For Goal Achievement 09/14/14  Potential to Achieve Goals Good  OT Time Calculation  OT Start Time 1429  OT Stop Time 1453  OT Time Calculation (min) 24 min  OT G-codes **NOT FOR INPATIENT CLASS**  Functional Assessment Tool Used clinical judgement  Functional Limitation Self care  Self Care Current Status (W0981(G8987) CI  Self Care Goal Status (X9147(G8988) CI  OT General Charges  $OT Visit 1 Procedure  OT Evaluation  $Initial OT Evaluation Tier I 1 Procedure  Written Expression  Dominant Hand Right  Jeani HawkingWendi Jeyla Bulger, OTR/L [phone]

## 2014-08-31 NOTE — ED Notes (Signed)
Physical therapy at bedside

## 2014-08-31 NOTE — Progress Notes (Addendum)
  CARE MANAGEMENT ED NOTE 08/31/2014  Patient:  Todd [name],Todd [name]   Account Number:  [id number]  Date Initiated:  08/31/2014  Documentation initiated by:  Edd ArbourGIBBS,Vicy Medico  Subjective/Objective Assessment:   80 yr old [place] medicare choice ppo with address for [place] [facility] with c/o found slumped over at breakfast table, after walking self to table with walker--- staff lowered him to floor, was on floor     Subjective/Objective Assessment Detail:     when EMS arrived. Pt was responsive at time of EMS arrival. Was not cooperative with EMS, on arrival, pt responsive, to verbal stimuli-- has slight right facial droop, will move all extremeties with encouragement.         pcp Marletta LorJulie Barr     Action/Plan:   ED Cm consulted by [facility] ED RN, Todd [name] and EDP, wickline about possible assist to get pt to a "higher level of care" or inquired about possible admission Cm [facility] & [facility] at   Action/Plan Detail:   [phone] and left a voice message about pt and requesting a return call   Anticipated DC Date:       Status Recommendation to Physician:   Result of Recommendation:    Other ED Services  Consult Working Plan   In-house referral  Clinical Social Worker   DC Associate Professorlanning Services  Other  Outpatient Services - Pt will follow up    Choice offered to / List presented to:            Status of service:  Completed, signed off  ED Comments:   ED Comments Detail:    1205 spoke with Todd [name] SW Updated Dr Bebe ShaggyWickline

## 2014-08-31 NOTE — H&P (Signed)
Date: 08/31/2014               Patient Name:  Todd FendtMilton Fortner MRN: [id number]  DOB: August 12, 1932 Age / Sex: 80 y.o., male   PCP: Marletta LorJulie Barr, NP         Medical Service: Internal Medicine Teaching Service         Attending Physician: Dr. Levert FeinsteinJames M Granfortuna, MD    First Contact: Dr. Heywood Ilesushil Patel Pager: [phone]  Second Contact: Dr. Inocente SallesEjiro Emokpae Pager: [phone]       After Hours (After 5p/  First Contact Pager: [phone]  weekends / holidays): Second Contact Pager: [phone]   Chief Complaint: syncope  History of Present Illness: [name] is an 80 year old male with dementia, DM2, HLD, HTN, CAD s/p CABG, CKD Stage 3, who presents with altered mental status. Given his level of dementia, much of the history is collected from EHR.  This morning, he went to the dining hall, but upon sitting down, he appeared unresponsive. Per nursing, he "appeared green" and "was drooling" but improved spontaneously. He had a similar episode yesterday when he was evaluated in the ED and thought to be 2/2 dehydration. In the ED, social work is involved in helping him procure SNF placement.  Of note, he was recently hospitalized 10/21-10/23 for syncope as well though it was unclear if he was having syncope or seizures. Neurology was consulted and did not feel anti-epileptics were warranted as they considered him to have a lower seizure threshold 2/2 dementia. Seroquel was discontinued until he could follow-up with his PCP to consider an alternative.   At bedside, he does not know what brought him to the hospital though feels it is more relaxing than "the other place."   Meds: No current facility-administered medications for this encounter.   Current Outpatient Prescriptions  Medication Sig Dispense Refill  . aspirin EC 325 MG tablet Take 325 mg by mouth daily.    [name]. atorvastatin (LIPITOR) 10 MG tablet Take 10 mg by mouth at bedtime.     . Cholecalciferol (VITAMIN D-3) 1000 UNITS CAPS Take 1,000 Units by mouth  daily.    [name]. glimepiride (AMARYL) 2 MG tablet Take 2 mg by mouth 2 (two) times daily.    . hydrochlorothiazide (HYDRODIURIL) 25 MG tablet Take 25 mg by mouth daily.    [name]. lisinopril (PRINIVIL,ZESTRIL) 10 MG tablet Take 10 mg by mouth daily.    . pioglitazone (ACTOS) 30 MG tablet Take 30 mg by mouth daily.    . QUEtiapine (SEROQUEL) 50 MG tablet Take 50 mg by mouth 2 (two) times daily.    . sitaGLIPtin (JANUVIA) 50 MG tablet Take 50 mg by mouth daily.    . vitamin B-12 (CYANOCOBALAMIN) 1000 MCG tablet Take 1,000 mcg by mouth daily.    [name]. amLODipine (NORVASC) 10 MG tablet Take 10 mg by mouth daily.      Allergies: Allergies as of 08/31/2014  . (No Known Allergies)   Past Medical History  Diagnosis Date  . Elevated cholesterol   . Hypertension   . Type II diabetes mellitus   . Dementia     "don't know stage or type" (08/18/2014)  . Arthritis     "probably"  . Chronic kidney disease     "? stage" (08/18/2014)   Past Surgical History  Procedure Laterality Date  . Tonsillectomy    . Coronary artery bypass graft  ?1994    "CABG X3"  . Cataract extraction     No  family history on file. History   Social History  . Marital Status: Widowed    Spouse Name: N/A    Number of Children: N/A  . Years of Education: N/A   Occupational History  . Not on file.   Social History Main Topics  . Smoking status: Former Smoker    Types: Pipe  . Smokeless tobacco: Never Used     Comment: "quit smoking a pipe in the 80's"  . Alcohol Use: No  . Drug Use: No  . Sexual Activity: Not on file   Other Topics Concern  . Not on file   Social History Narrative    Review of Systems: Review of Systems  Cardiovascular: Negative for chest pain.  Gastrointestinal: Negative for abdominal pain.  Neurological: Negative for dizziness.  All other systems reviewed and are negative.   Physical Exam: Blood pressure 136/66, pulse 85, temperature 98.2 F (36.8 C), resp. rate 15, SpO2 98 %.  General:  resting in bed, NAD HEENT: PERRL, EOMI, no scleral icterus Cardiac: systolic murmur best appreciated in aortic/pulmonic sites Pulm: clear to auscultation bilaterally in the anterior fields Abd: soft, nontender, nondistended, BS present Ext: warm and well perfused, no pedal edema Neuro: alert and oriented to name Todd [name]) but not to place or year, cranial nerves II-XII grossly intact, strength and sensation to light touch equal in bilateral upper and lower extremities   Lab results: Basic Metabolic Panel:  Recent Labs  16/10/96 1516 08/31/14 0944  NA 138 138  K 4.6 4.2  CL 98 98  CO2 25  --   GLUCOSE 292* 294*  BUN 27* 26*  CREATININE 1.35 1.50*  CALCIUM 9.5  --    CBC:  Recent Labs  08/30/14 1516 08/31/14 0944  WBC 5.4  --   HGB 11.7* 12.9*  HCT 34.5* 38.0*  MCV 91.0  --   PLT 186  --    Urinalysis:  Recent Labs  08/30/14 1327  COLORURINE YELLOW  LABSPEC 1.024  PHURINE 5.0  GLUCOSEU >1000*  HGBUR SMALL*  BILIRUBINUR NEGATIVE  KETONESUR 15*  PROTEINUR NEGATIVE  UROBILINOGEN 0.2  NITRITE NEGATIVE  LEUKOCYTESUR NEGATIVE   Imaging results:  Ct Head Wo Contrast  08/31/2014   CLINICAL DATA:  Altered mental status. The patient became unresponsive while eating his breakfast. He was response to verbal stimuli. There is a slight right facial droop. He is able to move all extremities.  EXAM: CT HEAD WITHOUT CONTRAST  TECHNIQUE: Contiguous axial images were obtained from the base of the skull through the vertex without intravenous contrast.  COMPARISON:  MRI brain and CT head 08/18/2014.  FINDINGS: Mild atrophy and white matter disease is unchanged from the previous studies. No acute cortical infarct, hemorrhage, or mass lesion is evident. The basal ganglia are intact. Insular ribbon is intact. The ventricles are proportionate to the degree of atrophy. The basal cisterns are patent. No significant extraaxial fluid collection is present.  There is scattered  opacification of ethmoid air cells, particularly posteriorly on the left and anteriorly on the right. The left sphenoid sinus is opacified. Fluid or soft tissue is noted at the left sphenoidal recess. The remaining paranasal sinuses and the mastoid air cells are clear.  IMPRESSION: 1. Stable appearance of atrophy and white matter disease without evidence for acute infarct, hemorrhage, or mass lesion. 2. Is stable sinus disease, most pronounced in the posterior left ethmoid air cells and left sphenoid sinus.   Electronically Signed   By: Gennette Pac  M.D.   On: 08/31/2014 10:02    Other results: EKG: Reviewed and compared with 08/19/14 Tachycardic, HR 90 Difficult to compare given what appears to be motion artifact   Assessment & Plan by Problem: Principal Problem:   Altered mental status Active Problems:   Diabetes mellitus, type 2   S/P CABG (coronary artery bypass graft)   CKD (chronic kidney disease)   Dementia   HTN (hypertension)  [name] is an 80 year old male with dementia, DM2, HLD, HTN, CAD s/p CABG, CKD Stage 3, who presents with altered mental status.   Altered mental status: Prior syncope workup has been unremarkable. He is pleasant at the time of interview. Seizure is possible though Neurology did not feel anti-epileptics were warranted at prior hospitalization. Seroquel has been restarted though unclear if decision by his PCP or just documentation error. -Consult OT/PT -Consult Social Work for SNF placement  Dementia: Appears stable though difficult to assess without prior baseline.  DM2: Continue sensitive SSI. CBGs trending 200. -Consider increasing to moderate SSI  HLD: Continue home meds.  HTN: Continue home meds.  CAD s/p CABG: Continue home ASA.  CKD Stage 3: Crt 1.5, up from 1.3 yesterday, though appears baseline from prior hospitalization (1.3-1.5).   #FEN:  -Diet: NPO  #DVT prophylaxis: heparin 5000 units subcutaneous  #CODE STATUS:  DNR/DNI  Dispo: Disposition is deferred at this time and pending SNF placement.  The patient does have a current PCP Marletta Lor([name], NP) and does not need an [facility] follow-up appointment after discharge.  The patient does have transportation limitations that hinder transportation to clinic appointments.  Signed: Heywood Ilesushil Patel, MD 08/31/2014, 2:06 PM

## 2014-08-31 NOTE — ED Notes (Signed)
[name] -- [name] -- called --- interested in speaking with physician regarding SNF placement. [phone]

## 2014-08-31 NOTE — Evaluation (Signed)
Physical Therapy Evaluation Patient Details Name: Todd [name] MRN: [id number] DOB: March 31, 1932 Today's Date: 08/31/2014   History of Present Illness  pt presents with syncopal episode and hx of recent admit for the same.  pt has hx of Dementia.    Clinical Impression  Pt with mild dysmetria on L side and decreased dorsiflexion on L noted during gait.  At this time pt is at a S-MinG level for mobility and would require 24hr S at D/C.  Feel pt would benefit from SNF at D/C to decrease burden of care.  Will continue to follow.      Follow Up Recommendations SNF    Equipment Recommendations  None recommended by PT    Recommendations for Other Services       Precautions / Restrictions Precautions Precautions: Fall Restrictions Weight Bearing Restrictions: No      Mobility  Bed Mobility Overal bed mobility: Needs Assistance Bed Mobility: Supine to Sit;Sit to Supine     Supine to sit: Supervision Sit to supine: Supervision   General bed mobility comments: pt uses momentum to A with mobility.    Transfers Overall transfer level: Needs assistance Equipment used: Rolling walker (2 wheeled) Transfers: Sit to/from UGI CorporationStand;Stand Pivot Transfers Sit to Stand: Supervision Stand pivot transfers: Supervision       General transfer comment: cues for safety as pt tends to "park" RW off to the side.    Ambulation/Gait Ambulation/Gait assistance: Min guard Ambulation Distance (Feet): 150 Feet Assistive device: Rolling walker (2 wheeled) Gait Pattern/deviations: Step-through pattern;Decreased stride length;Decreased dorsiflexion - left;Decreased stance time - left     General Gait Details: pt noted to have decreased dorsiflexion on L and tends to have dysmetric movement with L LE.    Stairs            Wheelchair Mobility    Modified Rankin (Stroke Patients Only)       Balance Overall balance assessment: Needs assistance Sitting-balance support: Feet supported;No  upper extremity supported Sitting balance-Leahy Scale: Fair Sitting balance - Comments: pt occasionally needs to use a single UE or leans against bed to additional balance support when attempting to don/doff socks.   Standing balance support: Bilateral upper extremity supported;No upper extremity supported Standing balance-Leahy Scale: Fair Standing balance comment: pt unable to accept balance challenges.                               Pertinent Vitals/Pain Pain Assessment: No/denies pain    Home Living Family/patient expects to be discharged to:: Unsure               Home Equipment: Dan HumphreysWalker - 2 wheels Additional Comments: [facility].      Prior Function           Comments: Unclear PLOF as pt unable to provide information.       Hand Dominance   Dominant Hand: Right    Extremity/Trunk Assessment   Upper Extremity Assessment: Defer to OT evaluation           Lower Extremity Assessment: Generalized weakness;LLE deficits/detail   LLE Deficits / Details: Decreased strength and coordination in L LE.    Cervical / Trunk Assessment: Normal  Communication   Communication: No difficulties  Cognition Arousal/Alertness: Awake/alert Behavior During Therapy: WFL for tasks assessed/performed Overall Cognitive Status: History of cognitive impairments - at baseline  General Comments      Exercises        Assessment/Plan    PT Assessment Patient needs continued PT services  PT Diagnosis Difficulty walking   PT Problem List Decreased strength;Decreased activity tolerance;Decreased balance;Decreased coordination;Decreased mobility;Decreased cognition;Decreased knowledge of use of DME  PT Treatment Interventions DME instruction;Gait training;Functional mobility training;Therapeutic activities;Therapeutic exercise;Balance training;Neuromuscular re-education;Cognitive remediation;Patient/family education   PT  Goals (Current goals can be found in the Care Plan section) Acute Rehab PT Goals Patient Stated Goal: did not state PT Goal Formulation: Patient unable to participate in goal setting Time For Goal Achievement: 09/07/14 Potential to Achieve Goals: Good    Frequency Min 3X/week   Barriers to discharge        Co-evaluation               End of Session Equipment Utilized During Treatment: Gait belt Activity Tolerance: Patient tolerated treatment well Patient left: in bed;with call bell/phone within reach Nurse Communication: Mobility status    Functional Assessment Tool Used: Clinical Judgement Functional Limitation: Mobility: Walking and moving around Mobility: Walking and Moving Around Current Status ([id number]([id number]): At least 1 percent but less than 20 percent impaired, limited or restricted Mobility: Walking and Moving Around Goal Status [phone](G8979): 0 percent impaired, limited or restricted    Time: [id number] PT Time Calculation (min): 26 min   Charges:   PT Evaluation $Initial PT Evaluation Tier I: 1 Procedure PT Treatments $Gait Training: 8-22 mins   PT G Codes:   Functional Assessment Tool Used: Clinical Judgement Functional Limitation: Mobility: Walking and moving around    Sunny SchleinRitenour, Alexcis Bicking F, South CarolinaPT [phone] 08/31/2014, 3:16 PM

## 2014-08-31 NOTE — Clinical Social Work Note (Signed)
Clinical Social Work Department BRIEF PSYCHOSOCIAL ASSESSMENT 08/31/2014  Patient:  Philis FendtMCCALL,Sadik     Account Number:  [id number]     Admit date:  08/31/2014  Clinical Social Worker:  Verl BlalockSCINTO,JESSE, LCSW  Date/Time:  08/31/2014 02:00 PM  Referred by:  Physician  Date Referred:  08/31/2014 Referred for  SNF Placement   Other Referral:   Interview type:  Family Other interview type:   Spoke with patient daughter over the phone - [name]  [phone] [facility]    PSYCHOSOCIAL DATA Living Status:  FACILITY Admitted from facility:   Level of care:  Assisted Living Primary support name:  [name]  [phone] Primary support relationship to patient:  CHILD, ADULT Degree of support available:   Strong but long distance    CURRENT CONCERNS Current Concerns  Post-Acute Placement   Other Concerns:    SOCIAL WORK ASSESSMENT / PLAN Clinical Social Worker spoke with patient daughter, Neysa BonitoChristy, over the phone to offer support and discuss patient needs at discharge.  Patient daughter states that patient is a current [facility] and has had several hospital visits in the last several weeks.  Patient has been having episodes of unresponsiveness and patient current facility continues to bring patient to the ED with each episode.  Patient daughter feels that patient is now requiring a higher level of care.  CSW explained process to patient daughter and states that there are no guarantees and insurance authorization must happen in order to arrange SNF placement - patient daughter understanding and appreciative.  CSW to initiate SNF search and insurance authorization.    Per MD, patient is now being admitted under observation status with 24 hour telemetry monitoring while SNF placement is pursued.  CSW will provide patient daughter with bed offers when available.  CSW remains available for support and to facilitate patient discharge needs once medically stable and bed available.    Assessment/plan status:  Psychosocial Support/Ongoing Assessment of Needs Other assessment/ plan:   Information/referral to community resources:   Clinical Social Worker contacted acute therapies to expedite patient PT/OT evaluations for [place] submission.  CSW also left a message for [facility] to inquire about potential LOG while insurance authorization is in process.    PATIENT'S/FAMILY'S RESPONSE TO PLAN OF CARE: Patient alert and oriented to person only at this time. Patient daughter from out of town but available by phone at all times.  Patient daughter is realistic regarding process, however hopeful that patient will be approved for a higher level of care.  Patient daughter states that patient recieves $2500/month.  Patient daughter has never attempted to apply for long term Medicaid but is open to the possibility in order to do what is in patient best interest.  Patient daughter verbalized understanding of CSW role and appreciation for support and assistance.

## 2014-08-31 NOTE — Clinical Social Work Placement (Addendum)
Clinical Social Work Department CLINICAL SOCIAL WORK PLACEMENT NOTE 08/31/2014  Patient:  Todd [name],Todd [name]  Account Number:  [id number] Admit date:  08/31/2014  Clinical Social Worker:  Macario GoldsJESSE SCINTO, LCSW  Date/time:  08/31/2014 02:30 PM  Clinical Social Work is seeking post-discharge placement for this patient at the following level of care:   SKILLED NURSING   (*CSW will update this form in Epic as items are completed)   08/31/2014  Patient/family provided with [facility] Department of Clinical Social Work's list of facilities offering this level of care within the geographic area requested by the patient (or if unable, by the patient's family).  08/31/2014  Patient/family informed of their freedom to choose among providers that offer the needed level of care, that participate in Medicare, Medicaid or managed care program needed by the patient, have an available bed and are willing to accept the patient.  08/31/2014  Patient/family informed of [facility]' ownership interest in [facility], as well as of the fact that they are under no obligation to receive care at this facility.  PASARR submitted to EDS on 08/31/2014 PASARR number received on 08/31/2014  FL2 transmitted to all facilities in geographic area requested by pt/family on  08/31/2014 FL2 transmitted to all facilities within larger geographic area on   Patient informed that his/her managed care company has contracts with or will negotiate with  certain facilities, including the following:     Patient/family informed of bed offers received:  None available Patient chooses bed at [facility] Physician recommends and patient chooses bed at    [facility] on  09/01/14 Patient to be transferred to facility by [facility] Patient and family notified of transfer on 09/01/14 Name of family member notified:  Todd [name] (daughter)  The following physician request were entered in  Epic:   Additional Comments: 11/3 [facility] started

## 2014-09-01 DIAGNOSIS — F0390 Unspecified dementia without behavioral disturbance: Secondary | ICD-10-CM | POA: Diagnosis not present

## 2014-09-01 DIAGNOSIS — R55 Syncope and collapse: Secondary | ICD-10-CM | POA: Diagnosis not present

## 2014-09-01 DIAGNOSIS — R404 Transient alteration of awareness: Secondary | ICD-10-CM

## 2014-09-01 DIAGNOSIS — I129 Hypertensive chronic kidney disease with stage 1 through stage 4 chronic kidney disease, or unspecified chronic kidney disease: Secondary | ICD-10-CM

## 2014-09-01 DIAGNOSIS — Z951 Presence of aortocoronary bypass graft: Secondary | ICD-10-CM

## 2014-09-01 DIAGNOSIS — Z87891 Personal history of nicotine dependence: Secondary | ICD-10-CM

## 2014-09-01 DIAGNOSIS — E860 Dehydration: Secondary | ICD-10-CM | POA: Diagnosis not present

## 2014-09-01 DIAGNOSIS — N183 Chronic kidney disease, stage 3 (moderate): Secondary | ICD-10-CM

## 2014-09-01 DIAGNOSIS — I2510 Atherosclerotic heart disease of native coronary artery without angina pectoris: Secondary | ICD-10-CM

## 2014-09-01 DIAGNOSIS — E785 Hyperlipidemia, unspecified: Secondary | ICD-10-CM

## 2014-09-01 DIAGNOSIS — E780 Pure hypercholesterolemia: Secondary | ICD-10-CM | POA: Diagnosis not present

## 2014-09-01 LAB — GLUCOSE, CAPILLARY
Glucose-Capillary: 267 mg/dL — ABNORMAL HIGH (ref 70–99)
Glucose-Capillary: 310 mg/dL — ABNORMAL HIGH (ref 70–99)

## 2014-09-01 NOTE — Discharge Instructions (Signed)
Thank you for trusting us with your medical care!  You were hospitalized for altered mental status and were kept overnight for facility placement.  Please continue to take your medications regularly.

## 2014-09-01 NOTE — Discharge Summary (Signed)
Name: Todd [name] MRN: [id number] DOB: [birth year] 80 y.o. PCP: Marletta LorJulie Barr, NP  Date of Admission: 08/31/2014  9:00 AM Date of Discharge: 09/01/2014 Attending Physician: Levert FeinsteinJames M Granfortuna, MD  Discharge Diagnosis: Principal Problem:   Altered mental status Active Problems:   Diabetes mellitus, type 2   S/P CABG (coronary artery bypass graft)   CKD (chronic kidney disease)   Dementia   HTN (hypertension)  Discharge Medications:   Medication List    TAKE these medications        amLODipine 10 MG tablet  Commonly known as:  NORVASC  Take 10 mg by mouth daily.     aspirin EC 325 MG tablet  Take 325 mg by mouth daily.     atorvastatin 10 MG tablet  Commonly known as:  LIPITOR  Take 10 mg by mouth at bedtime.     glimepiride 2 MG tablet  Commonly known as:  AMARYL  Take 2 mg by mouth 2 (two) times daily.     hydrochlorothiazide 25 MG tablet  Commonly known as:  HYDRODIURIL  Take 25 mg by mouth daily.     lisinopril 10 MG tablet  Commonly known as:  PRINIVIL,ZESTRIL  Take 10 mg by mouth daily.     pioglitazone 30 MG tablet  Commonly known as:  ACTOS  Take 30 mg by mouth daily.     QUEtiapine 50 MG tablet  Commonly known as:  SEROQUEL  Take 50 mg by mouth 2 (two) times daily.     sitaGLIPtin 50 MG tablet  Commonly known as:  JANUVIA  Take 50 mg by mouth daily.     vitamin B-12 1000 MCG tablet  Commonly known as:  CYANOCOBALAMIN  Take 1,000 mcg by mouth daily.     Vitamin D-3 1000 UNITS Caps  Take 1,000 Units by mouth daily.        Disposition and follow-up:   Todd [name] was discharged from [facility] in Stable condition.  At the hospital follow up visit please address:  1.  Need for Seroquel  2.  Labs / imaging needed at time of follow-up: none  3.  Pending labs/ test needing follow-up: none  Follow-up Appointments: Follow-up Information    Follow up with Marletta LorBarr, Julie, NP In 1 week.   Specialty:  Nurse Practitioner   Contact information:   [facility] [address] [place] [address] [phone]       Discharge Instructions:   Procedures Performed:  Dg Chest 2 View  08/18/2014   CLINICAL DATA:  Syncope.  EXAM: CHEST  2 VIEW  COMPARISON:  None.  FINDINGS: The heart size and mediastinal contours are within normal limits. Both lungs are clear. No pneumothorax or pleural effusion is noted. Status post coronary artery bypass graft. Degenerative changes of lower lumbar spine are noted.  IMPRESSION: No acute cardiopulmonary abnormality seen.   Electronically Signed   By: Roque LiasJames  Green M.D.   On: 08/18/2014 12:39   Ct Head Wo Contrast  08/31/2014   CLINICAL DATA:  Altered mental status. The patient became unresponsive while eating his breakfast. He was response to verbal stimuli. There is a slight right facial droop. He is able to move all extremities.  EXAM: CT HEAD WITHOUT CONTRAST  TECHNIQUE: Contiguous axial images were obtained from the base of the skull through the vertex without intravenous contrast.  COMPARISON:  MRI brain and CT head 08/18/2014.  FINDINGS: Mild atrophy and white matter disease  is unchanged from the previous studies. No acute cortical infarct, hemorrhage, or mass lesion is evident. The basal ganglia are intact. Insular ribbon is intact. The ventricles are proportionate to the degree of atrophy. The basal cisterns are patent. No significant extraaxial fluid collection is present.  There is scattered opacification of ethmoid air cells, particularly posteriorly on the left and anteriorly on the right. The left sphenoid sinus is opacified. Fluid or soft tissue is noted at the left sphenoidal recess. The remaining paranasal sinuses and the mastoid air cells are clear.  IMPRESSION: 1. Stable appearance of atrophy and white matter disease without evidence for acute infarct, hemorrhage, or mass lesion. 2. Is stable sinus disease, most pronounced in the posterior left  ethmoid air cells and left sphenoid sinus.   Electronically Signed   By: Gennette Pac M.D.   On: 08/31/2014 10:02   Ct Head Wo Contrast  08/18/2014   CLINICAL DATA:  Near syncope.  Slurred speech  EXAM: CT HEAD WITHOUT CONTRAST  TECHNIQUE: Contiguous axial images were obtained from the base of the skull through the vertex without intravenous contrast.  COMPARISON:  None.  FINDINGS: Generalized atrophy.  Negative for acute infarct.  Negative for hemorrhage or mass lesion.  Chronic sinusitis with mucosal thickening in the left sphenoid and ethmoid sinus. Mild mucosal edema in the maxillary sinus bilaterally. No acute bony abnormality.  IMPRESSION: Generalized atrophy.  No acute abnormality  Chronic sinusitis.   Electronically Signed   By: Marlan Palau M.D.   On: 08/18/2014 12:45   Mr Brain Wo Contrast  08/18/2014   CLINICAL DATA:  Sudden onset of slurred speech and drooling with altered mental status. There is some recovery but the patient is not a baseline. Syncope.  EXAM: MRI HEAD WITHOUT CONTRAST  TECHNIQUE: Multiplanar, multiecho pulse sequences of the brain and surrounding structures were obtained without intravenous contrast.  COMPARISON:  CT head without contrast 08/18/2014.  FINDINGS: The diffusion-weighted images demonstrate no evidence for acute or subacute infarction. No hemorrhage or mass lesion is evident.  The left sphenoid sinus and posterior left ethmoid air cells are opacified with restricted diffusion suggesting acute infection.  Mild atrophy and white matter disease is potentially within normal limits for age. The ventricles are proportionate to the degree of atrophy.  Flow is present in the major intracranial arteries. The patient is status post left lens replacement. The globes and orbits are intact bilaterally. Mild mucosal thickening is present in the anterior ethmoid air cells and inferior frontal sinuses. There is fluid in the right mastoid air cells. No obstructing nasopharyngeal  lesion is evident.  IMPRESSION: 1. No evidence for acute infarct. 2. Mild generalized atrophy and white matter disease, potentially within normal limits for age. 3. Posterior left ethmoid and sphenoid sinus disease with restricted diffusion compatible with acute sinusitis. There is likely a chronic component as well.   Electronically Signed   By: Gennette Pac M.D.   On: 08/18/2014 19:07   Admission HPI: Todd [name] is an 80 year old male with dementia, DM2, HLD, HTN, CAD s/p CABG, CKD Stage 3, who presents with altered mental status. Given his level of dementia, much of the history is collected from EHR.  This morning, he went to the dining hall, but upon sitting down, he appeared unresponsive. Per nursing, he "appeared green" and "was drooling" but improved spontaneously. He had a similar episode yesterday when he was evaluated in the ED and thought to be 2/2 dehydration. In the ED, social work  is involved in helping him procure SNF placement.  Of note, he was recently hospitalized 10/21-10/23 for syncope as well though it was unclear if he was having syncope or seizures. Neurology was consulted and did not feel anti-epileptics were warranted as they considered him to have a lower seizure threshold 2/2 dementia. Seroquel was discontinued until he could follow-up with his PCP to consider an alternative.   At bedside, he does not know what brought him to the hospital though feels it is more relaxing than "the other place."  Hospital Course by problem list: Principal Problem:   Altered mental status Active Problems:   Diabetes mellitus, type 2   S/P CABG (coronary artery bypass graft)   CKD (chronic kidney disease)   Dementia   HTN (hypertension)   Altered mental status: During his hospital stay, he remained in good spirits and appeared to be back at baseline. No medications were held though the need for Seroquel should be assessed by PCP as noted in the discharge summary for his prior  hospitalization.  DM2: Stable on home meds.  CAD s/p CABG: Stable on home meds.  CKD Stage 3: Stable on home meds.  Dementia: Stable on home meds.  HTN: Stable on home meds.  Discharge Vitals:   BP 118/48 mmHg  Pulse 72  Temp(Src) 98.2 F (36.8 C) (Oral)  Resp 16  Ht 5\' 11"  (1.803 m)  Wt 183 lb 12.8 oz (83.371 kg)  BMI 25.65 kg/m2  SpO2 100%  Discharge Labs:  Results for orders placed or performed during the hospital encounter of 08/31/14 (from the past 24 hour(s))  Glucose, capillary     Status: Abnormal   Collection Time: 08/31/14  4:59 PM  Result Value Ref Range   Glucose-Capillary 220 (H) 70 - 99 mg/dL  Glucose, capillary     Status: Abnormal   Collection Time: 08/31/14  8:01 PM  Result Value Ref Range   Glucose-Capillary 271 (H) 70 - 99 mg/dL  Glucose, capillary     Status: Abnormal   Collection Time: 09/01/14  7:59 AM  Result Value Ref Range   Glucose-Capillary 267 (H) 70 - 99 mg/dL  Glucose, capillary     Status: Abnormal   Collection Time: 09/01/14 11:42 AM  Result Value Ref Range   Glucose-Capillary 310 (H) 70 - 99 mg/dL    Signed: Heywood Ilesushil Naya Ilagan, MD 09/01/2014, 1:26 PM    Services Ordered on Discharge: None Equipment Ordered on Discharge: None

## 2014-09-01 NOTE — Progress Notes (Signed)
Report called and given to Angie at [facility].

## 2014-09-01 NOTE — Progress Notes (Addendum)
CSW Proofreader(Clinical Social Worker) spoke with pt daughter and notified that pt will be discharged back to [facility] today. CSW notified pt daughter that ALF informed CSW they are more than comfortable and capable of handling pt care and do not feel SNF is necessary. CSW suggested that pt daughter speak with pt PCP as they are more familiar with pt and pt level of need. Pt daughter thankful for information and agreeable to pt returning to ALF today. CSW called facility and left voicemail to confirm if facility and provide transport and that completed FL2 was received.  ADDENDUM 2:45pm: Facility confirmed CSW can dc pt back via PTAR. CSW (Clinical Child psychotherapistocial Worker) prepared pt dc packet and placed with shadow chart. CSW arranged non-emergent ambulance transport. Pt family, pt nurse, and facility informed. CSW signing off.   Darothy Courtright, LCSWA [phone]

## 2014-09-01 NOTE — Progress Notes (Signed)
CSW Proofreader(Clinical Social Worker) spoke with pt daughter this morning about difficulty of finding a facility that will be able to get insurance authorization or accept an LOG for today. Pt daughter agreeable to CSW sending referral [facility] as well since pt will have support there. Pt daughter not agreeable to CSW sending referral to other counties. Pt daughter understanding and agreeable for pt to return to [facility] if SNF placement cannot be worked out for today.   Jenavive Lamboy, LCSWA [phone]

## 2014-09-01 NOTE — Progress Notes (Signed)
UR completed 

## 2014-09-01 NOTE — Progress Notes (Signed)
CSW (Clinical Child psychotherapistocial Worker) checked bed offers and at this time pt has no bed offers. CSW called [facility] and spoke with Lillia AbedLindsay who confirmed they are comfortable handling pt care. They are requesting to review clinicals prior to dc but do not anticipate any issues. Fax number provided is [phone]. CSW to update pt daughter at Brunetta Jeans2pm.  Taimane Stimmel, [place] [phone]

## 2014-09-01 NOTE — Progress Notes (Signed)
Subjective: No complaints this AM. He does not recall ever "turning green."  Per Social Work, he will be going back to his assisted living facility from which he will then go to SNF.   Objective: Vital signs in last 24 hours: Filed Vitals:   08/31/14 1617 08/31/14 2003 09/01/14 0544 09/01/14 0900  BP: 161/61 119/47 132/56 118/48  Pulse:  86 74 72  Temp:  98.1 F (36.7 C) 98.7 F (37.1 C) 98.2 F (36.8 C)  TempSrc:  Oral Oral Oral  Resp:    16  Height: 5\' 11"  (1.803 m)     Weight: 183 lb 3.2 oz (83.099 kg)  183 lb 12.8 oz (83.371 kg)   SpO2: 100% 100% 99% 100%   Weight change:   Intake/Output Summary (Last 24 hours) at 09/01/14 1400 Last data filed at 09/01/14 0000  Gross per 24 hour  Intake    120 ml  Output    200 ml  Net    -80 ml   General: sitting in chair, NAD HEENT: PERRL, EOMI, no scleral icterus Cardiac: systolic murmur best appreciated in aortic/pulmonic sites Pulm: clear to auscultation bilaterally Abd: soft, nontender, nondistended, BS present Ext: warm and well perfused, no pedal edema Neuro: alert and responsive to name, cranial nerves II-XII grossly intact, strength and sensation to light touch equal in bilateral upper and lower extremities  Lab Results: Basic Metabolic Panel:  Recent Labs Lab 08/30/14 1516 08/31/14 0944  NA 138 138  K 4.6 4.2  CL 98 98  CO2 25  --   GLUCOSE 292* 294*  BUN 27* 26*  CREATININE 1.35 1.50*  CALCIUM 9.5  --    CBC:  Recent Labs Lab 08/30/14 1516 08/31/14 0944  WBC 5.4  --   HGB 11.7* 12.9*  HCT 34.5* 38.0*  MCV 91.0  --   PLT 186  --    CBG:  Recent Labs Lab 08/31/14 1659 08/31/14 2001 09/01/14 0759 09/01/14 1142  GLUCAP 220* 271* 267* 310*   Medications: I have reviewed the patient's current medications. Scheduled Meds: . amLODipine  10 mg Oral Daily  . aspirin EC  325 mg Oral Daily  . atorvastatin  10 mg Oral QHS  . heparin  5,000 Units Subcutaneous 3 times per day  .  hydrochlorothiazide  25 mg Oral Daily  . insulin aspart  0-9 Units Subcutaneous TID WC  . lisinopril  10 mg Oral Daily  . QUEtiapine  50 mg Oral BID  . sodium chloride  3 mL Intravenous Q12H  . vitamin B-12  1,000 mcg Oral Daily   Continuous Infusions:  PRN Meds:.acetaminophen **OR** acetaminophen, ondansetron **OR** ondansetron (ZOFRAN) IV Assessment/Plan: Principal Problem:   Altered mental status Active Problems:   Diabetes mellitus, type 2   S/P CABG (coronary artery bypass graft)   CKD (chronic kidney disease)   Dementia   HTN (hypertension)  Todd [name] is an 80 year old male with dementia, DM2, HLD, HTN, CAD s/p CABG, CKD Stage 3 hospitalized for altered mental status now stable for discharge.   Altered mental status: Etiology unclear as he has been at what appears to be at baseline during his hospital day. -Defer SNF placement upon return to his prior facility  Dementia: Stable.Defer discontinuing Seroquel to PCP per prior discharge summary from most recent hospitalization. -Attempted to reach PCP and left voicemail  DM2: Continue sensitive SSI. CBGs trending 200-300 likely since his home meds were held given admitting diagnosis. -Resume home meds at discharge  HLD: Continue home meds.  HTN: Continue home meds.  CAD s/p CABG: Continue home ASA.  CKD Stage 3: Continue home meds.    #FEN:  -Diet: NPO  #DVT prophylaxis: heparin 5000 units subcutaneous  #CODE STATUS: DNR/DNI  Dispo: Disposition is today.  The patient does have a current PCP Todd Lor([name], NP) and does not need an [facility] follow-up appointment after discharge.  The patient does have transportation limitations that hinder transportation to clinic appointments.  .Services Needed at time of discharge: Y = Yes, Blank = No PT:   OT:   RN:   Equipment:   Other:     LOS: 1 day   Heywood Ilesushil Lashan Macias, MD 09/01/2014, 2:00 PM

## 2014-09-01 NOTE — Care Management Note (Signed)
    Page 1 of 1   09/01/2014     10:34:53 AM CARE MANAGEMENT NOTE 09/01/2014  Patient:  Todd [name],Todd [name]   Account Number:  [id number]  Date Initiated:  09/01/2014  Documentation initiated by:  GRAVES-BIGELOW,Mayukha Symmonds  Subjective/Objective Assessment:   Pt admitted for syncope. Pt is from [facility].     Action/Plan:   CSW assisting with disposition needs either back to ALF vs SNF. No needs from CM at this time.   Anticipated DC Date:  09/01/2014   Anticipated DC Plan:  ASSISTED LIVING / REST HOME  In-house referral  Clinical Social Worker      DC Planning Services  CM consult      Choice offered to / List presented to:             Status of service:  Completed, signed off Medicare Important Message given?  NO (If response is "NO", the following Medicare IM given date fields will be blank) Date Medicare IM given:   Medicare IM given by:   Date Additional Medicare IM given:   Additional Medicare IM given by:    Discharge Disposition:  ASSISTED LIVING  Per UR Regulation:  Reviewed for med. necessity/level of care/duration of stay  If discussed at Long Length of Stay Meetings, dates discussed:    Comments:

## 2014-09-01 NOTE — Progress Notes (Signed)
Physical Therapy Treatment Patient Details Name: Todd [name] MRN: [id number] DOB: [birth year] Today's Date: 09/01/2014    History of Present Illness pt presents with syncopal episode and hx of recent admit for the same.  pt has hx of Dementia.      PT Comments    Pt progressing with safety with mobility, should be able to return to previous ALF environment with HHPT. Ambulated 400' with RW and supervision, LLE there ex on stairs. PT will continue to follow.   Follow Up Recommendations  Home health PT (at ALF)     Equipment Recommendations  None recommended by PT    Recommendations for Other Services       Precautions / Restrictions Precautions Precautions: Fall Restrictions Weight Bearing Restrictions: No    Mobility  Bed Mobility               General bed mobility comments: pt received in chair  Transfers Overall transfer level: Needs assistance Equipment used: Rolling walker (2 wheeled) Transfers: Sit to/from Stand Sit to Stand: Supervision         General transfer comment: vc's for hand placement, pt used momentum to boost self up but does not require physical assist. vc's for use of hands on chair as pt tends to uncontrolled sit  Ambulation/Gait Ambulation/Gait assistance: Min guard Ambulation Distance (Feet): 400 Feet Assistive device: Rolling walker (2 wheeled) Gait Pattern/deviations: Step-through pattern Gait velocity: WFL   General Gait Details: pace has increased as well as symmetry   Stairs Stairs: Yes Stairs assistance: Min assist Stair Management: One rail Left Number of Stairs: 10 General stair comments: performed ther ex on stairs, step-ups with LLE and then flight ascension. LE weakness more obvious on stairs, prefers to step up with right but forced left step up to strengthen. Min A for safety to descend  Wheelchair Mobility    Modified Rankin (Stroke Patients Only)       Balance Overall balance assessment: Needs  assistance Sitting-balance support: No upper extremity supported;Feet supported Sitting balance-Leahy Scale: Good     Standing balance support: No upper extremity supported;During functional activity Standing balance-Leahy Scale: Fair Standing balance comment: static standing without UE but unsafe with dynamic activity without support                    Cognition Arousal/Alertness: Awake/alert Behavior During Therapy: WFL for tasks assessed/performed Overall Cognitive Status: History of cognitive impairments - at baseline       Memory: Decreased recall of precautions;Decreased short-term memory              Exercises      General Comments General comments (skin integrity, edema, etc.): pt improving, will likely be able to return to previous ALF environment which would be more positive for him from a cognitive standpoint      Pertinent Vitals/Pain Pain Assessment: No/denies pain    Home Living                      Prior Function            PT Goals (current goals can now be found in the care plan section) Acute Rehab PT Goals Patient Stated Goal: did not state PT Goal Formulation: Patient unable to participate in goal setting Time For Goal Achievement: 09/07/14 Potential to Achieve Goals: Good Progress towards PT goals: Progressing toward goals    Frequency  Min 3X/week    PT Plan Discharge plan needs to be  updated    [name]-evaluation             End of Session Equipment Utilized During Treatment: Gait belt Activity Tolerance: Patient tolerated treatment well Patient left: in chair;with call bell/phone within reach     Time: 1009-1025 PT Time Calculation (min): 16 min  Charges:  $Gait Training: 8-22 mins                    G Codes:     Todd [name], PT  Acute Rehab Services  [phone]  Todd [name], Todd [name] 09/01/2014, 10:38 AM

## 2014-09-01 NOTE — Progress Notes (Signed)
Inpatient Diabetes Program Recommendations  AACE/ADA: New Consensus Statement on Inpatient Glycemic Control (2013)  Target Ranges:  Prepandial:   less than 140 mg/dL      Peak postprandial:   less than 180 mg/dL (1-2 hours)      Critically ill patients:  140 - 180 mg/dL     Results for Todd [name], Todd [name] (MRN [id number]) as of 09/01/2014 08:33  Ref. Range 08/31/2014 16:59 08/31/2014 20:01 09/01/2014 07:59  Glucose-Capillary Latest Range: 70-99 mg/dL 147220 (H) 829271 (H) 562267 (H)    Results for Todd [name], Todd [name] (MRN [id number]) as of 09/01/2014 08:33  Ref. Range 08/18/2014 18:16  Hgb A1c MFr Bld Latest Range: <5.7 % 13.0 (H)     Admitted with Syncope/ AMS.  History of DM, Dementia, HTN, CAD, CKD3.   Home DM Meds: Amaryl 2 mg bid       Actos 30 mg QD        Januvia 50 mg QD   Current Insulin Orders: Novolog Sensitive SSI   **Note patient will need SNF at time of discharge.  **With an A1c of 13.1% (drawn back on 08/18/14), patient likely needs insulin at SNF.  **Fasting glucose remains elevated along with postprandial glucose levels    MD- Please consider the following insulin adjustments:  1. Add basal insulin- Lantus 12 units QHS (0.15 units/kg dosing) 2. Increase Novolog SSI to Moderate scale tid ac + HS    Will follow Ambrose FinlandJeannine Johnston Samirah Scarpati RN, MSN, CDE Diabetes Coordinator Inpatient Diabetes Program Team Pager: [phone] (8a-10p)

## 2014-10-03 ENCOUNTER — Inpatient Hospital Stay (HOSPITAL_COMMUNITY)
Admission: EM | Admit: 2014-10-03 | Discharge: 2014-10-06 | DRG: 069 | Disposition: A | Discharge status: Home / Self Care | Payer: Medicare PPO | Attending: Internal Medicine | Admitting: Internal Medicine

## 2014-10-03 ENCOUNTER — Encounter (HOSPITAL_COMMUNITY): Payer: Self-pay | Admitting: Emergency Medicine

## 2014-10-03 ENCOUNTER — Emergency Department (HOSPITAL_COMMUNITY): Payer: Medicare PPO

## 2014-10-03 DIAGNOSIS — E1165 Type 2 diabetes mellitus with hyperglycemia: Secondary | ICD-10-CM

## 2014-10-03 DIAGNOSIS — R404 Transient alteration of awareness: Secondary | ICD-10-CM | POA: Diagnosis not present

## 2014-10-03 DIAGNOSIS — Z794 Long term (current) use of insulin: Secondary | ICD-10-CM

## 2014-10-03 DIAGNOSIS — G8194 Hemiplegia, unspecified affecting left nondominant side: Secondary | ICD-10-CM | POA: Diagnosis present

## 2014-10-03 DIAGNOSIS — M1990 Unspecified osteoarthritis, unspecified site: Secondary | ICD-10-CM | POA: Diagnosis present

## 2014-10-03 DIAGNOSIS — I129 Hypertensive chronic kidney disease with stage 1 through stage 4 chronic kidney disease, or unspecified chronic kidney disease: Secondary | ICD-10-CM | POA: Diagnosis present

## 2014-10-03 DIAGNOSIS — Z8673 Personal history of transient ischemic attack (TIA), and cerebral infarction without residual deficits: Secondary | ICD-10-CM

## 2014-10-03 DIAGNOSIS — Z79899 Other long term (current) drug therapy: Secondary | ICD-10-CM | POA: Diagnosis not present

## 2014-10-03 DIAGNOSIS — I6523 Occlusion and stenosis of bilateral carotid arteries: Secondary | ICD-10-CM | POA: Diagnosis present

## 2014-10-03 DIAGNOSIS — Z66 Do not resuscitate: Secondary | ICD-10-CM | POA: Diagnosis present

## 2014-10-03 DIAGNOSIS — G9340 Encephalopathy, unspecified: Secondary | ICD-10-CM

## 2014-10-03 DIAGNOSIS — F0391 Unspecified dementia with behavioral disturbance: Secondary | ICD-10-CM | POA: Diagnosis present

## 2014-10-03 DIAGNOSIS — Z7982 Long term (current) use of aspirin: Secondary | ICD-10-CM

## 2014-10-03 DIAGNOSIS — E785 Hyperlipidemia, unspecified: Secondary | ICD-10-CM | POA: Diagnosis present

## 2014-10-03 DIAGNOSIS — E118 Type 2 diabetes mellitus with unspecified complications: Secondary | ICD-10-CM

## 2014-10-03 DIAGNOSIS — I10 Essential (primary) hypertension: Secondary | ICD-10-CM | POA: Diagnosis present

## 2014-10-03 DIAGNOSIS — N183 Chronic kidney disease, stage 3 unspecified: Secondary | ICD-10-CM | POA: Diagnosis present

## 2014-10-03 DIAGNOSIS — I639 Cerebral infarction, unspecified: Secondary | ICD-10-CM | POA: Diagnosis present

## 2014-10-03 DIAGNOSIS — G459 Transient cerebral ischemic attack, unspecified: Secondary | ICD-10-CM | POA: Diagnosis present

## 2014-10-03 DIAGNOSIS — R531 Weakness: Secondary | ICD-10-CM

## 2014-10-03 DIAGNOSIS — Z87891 Personal history of nicotine dependence: Secondary | ICD-10-CM

## 2014-10-03 DIAGNOSIS — Z951 Presence of aortocoronary bypass graft: Secondary | ICD-10-CM | POA: Diagnosis not present

## 2014-10-03 DIAGNOSIS — IMO0002 Reserved for concepts with insufficient information to code with codable children: Secondary | ICD-10-CM

## 2014-10-03 DIAGNOSIS — I2510 Atherosclerotic heart disease of native coronary artery without angina pectoris: Secondary | ICD-10-CM | POA: Diagnosis present

## 2014-10-03 DIAGNOSIS — F0150 Vascular dementia without behavioral disturbance: Secondary | ICD-10-CM | POA: Diagnosis present

## 2014-10-03 LAB — RAPID URINE DRUG SCREEN, HOSP PERFORMED
AMPHETAMINES: NOT DETECTED
BARBITURATES: NOT DETECTED
Benzodiazepines: NOT DETECTED
Cocaine: NOT DETECTED
OPIATES: NOT DETECTED
TETRAHYDROCANNABINOL: NOT DETECTED

## 2014-10-03 LAB — CBC
HEMATOCRIT: 34.5 % — AB (ref 39.0–52.0)
HEMOGLOBIN: 11.8 g/dL — AB (ref 13.0–17.0)
MCH: 31.2 pg (ref 26.0–34.0)
MCHC: 34.2 g/dL (ref 30.0–36.0)
MCV: 91.3 fL (ref 78.0–100.0)
Platelets: 180 10*3/uL (ref 150–400)
RBC: 3.78 MIL/uL — AB (ref 4.22–5.81)
RDW: 12.9 % (ref 11.5–15.5)
WBC: 6.5 10*3/uL (ref 4.0–10.5)

## 2014-10-03 LAB — LIPID PANEL
Cholesterol: 192 mg/dL (ref 0–200)
HDL: 66 mg/dL (ref >39)
LDL CALC: 117 mg/dL — AB (ref 0–99)
TRIGLYCERIDES: 43 mg/dL (ref <150)
Total CHOL/HDL Ratio: 2.9 RATIO
VLDL: 9 mg/dL (ref 0–40)

## 2014-10-03 LAB — DIFFERENTIAL
Basophils Absolute: 0 10*3/uL (ref 0.0–0.1)
Basophils Relative: 0 % (ref 0–1)
Eosinophils Absolute: 0.1 10*3/uL (ref 0.0–0.7)
Eosinophils Relative: 2 % (ref 0–5)
LYMPHS PCT: 51 % — AB (ref 12–46)
Lymphs Abs: 3.3 10*3/uL (ref 0.7–4.0)
MONO ABS: 0.5 10*3/uL (ref 0.1–1.0)
MONOS PCT: 7 % (ref 3–12)
NEUTROS ABS: 2.6 10*3/uL (ref 1.7–7.7)
NEUTROS PCT: 40 % — AB (ref 43–77)

## 2014-10-03 LAB — APTT: aPTT: 24 seconds (ref 24–37)

## 2014-10-03 LAB — URINE MICROSCOPIC-ADD ON

## 2014-10-03 LAB — PROTIME-INR
INR: 1.13 (ref 0.00–1.49)
Prothrombin Time: 14.6 seconds (ref 11.6–15.2)

## 2014-10-03 LAB — URINALYSIS, ROUTINE W REFLEX MICROSCOPIC
Bilirubin Urine: NEGATIVE
Glucose, UA: >1000 mg/dL — AB
HGB URINE DIPSTICK: NEGATIVE
Ketones, ur: NEGATIVE mg/dL
NITRITE: NEGATIVE
PROTEIN: NEGATIVE mg/dL
SPECIFIC GRAVITY, URINE: 1.023 (ref 1.005–1.030)
UROBILINOGEN UA: 1 mg/dL (ref 0.0–1.0)
pH: 5 (ref 5.0–8.0)

## 2014-10-03 LAB — COMPREHENSIVE METABOLIC PANEL
ALBUMIN: 3.4 g/dL — AB (ref 3.5–5.2)
ALK PHOS: 55 U/L (ref 39–117)
ALT: 15 U/L (ref 0–53)
AST: 17 U/L (ref 0–37)
Anion gap: 13 (ref 5–15)
BUN: 30 mg/dL — AB (ref 6–23)
CHLORIDE: 100 meq/L (ref 96–112)
CO2: 26 mEq/L (ref 19–32)
Calcium: 9.7 mg/dL (ref 8.4–10.5)
Creatinine, Ser: 1.5 mg/dL — ABNORMAL HIGH (ref 0.50–1.35)
GFR calc Af Amer: 49 mL/min — ABNORMAL LOW (ref >90)
GFR calc non Af Amer: 42 mL/min — ABNORMAL LOW (ref >90)
Glucose, Bld: 196 mg/dL — ABNORMAL HIGH (ref 70–99)
Potassium: 3.9 mEq/L (ref 3.7–5.3)
Sodium: 139 mEq/L (ref 137–147)
Total Bilirubin: 0.5 mg/dL (ref 0.3–1.2)
Total Protein: 6.9 g/dL (ref 6.0–8.3)

## 2014-10-03 LAB — I-STAT CHEM 8, ED
BUN: 36 mg/dL — ABNORMAL HIGH (ref 6–23)
CHLORIDE: 102 meq/L (ref 96–112)
CREATININE: 1.6 mg/dL — AB (ref 0.50–1.35)
Calcium, Ion: 1.18 mmol/L (ref 1.13–1.30)
GLUCOSE: 197 mg/dL — AB (ref 70–99)
HCT: 38 % — ABNORMAL LOW (ref 39.0–52.0)
Hemoglobin: 12.9 g/dL — ABNORMAL LOW (ref 13.0–17.0)
Potassium: 3.7 mEq/L (ref 3.7–5.3)
Sodium: 140 mEq/L (ref 137–147)
TCO2: 26 mmol/L (ref 0–100)

## 2014-10-03 LAB — I-STAT TROPONIN, ED: TROPONIN I, POC: 0.01 ng/mL (ref 0.00–0.08)

## 2014-10-03 LAB — GLUCOSE, CAPILLARY
GLUCOSE-CAPILLARY: 275 mg/dL — AB (ref 70–99)
GLUCOSE-CAPILLARY: 106 mg/dL — AB (ref 70–99)
Glucose-Capillary: 62 mg/dL — ABNORMAL LOW (ref 70–99)

## 2014-10-03 LAB — CBG MONITORING, ED: Glucose-Capillary: 171 mg/dL — ABNORMAL HIGH (ref 70–99)

## 2014-10-03 LAB — TSH: TSH: 0.916 u[IU]/mL (ref 0.350–4.500)

## 2014-10-03 LAB — ETHANOL

## 2014-10-03 MED ORDER — ATORVASTATIN CALCIUM 80 MG PO TABS
80.0000 mg | ORAL_TABLET | Freq: Every day | ORAL | Status: DC
  Administered 2014-10-03 – 2014-10-05 (×3): 80 mg via ORAL
  Filled 2014-10-03 (×3): qty 1

## 2014-10-03 MED ORDER — ASPIRIN 325 MG PO TABS
325.0000 mg | ORAL_TABLET | Freq: Once | ORAL | Status: AC
  Administered 2014-10-03: 325 mg via ORAL
  Filled 2014-10-03: qty 1

## 2014-10-03 MED ORDER — INSULIN ASPART 100 UNIT/ML ~~LOC~~ SOLN
0.0000 [IU] | Freq: Three times a day (TID) | SUBCUTANEOUS | Status: DC
  Administered 2014-10-03: 8 [IU] via SUBCUTANEOUS
  Administered 2014-10-04 (×2): 5 [IU] via SUBCUTANEOUS

## 2014-10-03 MED ORDER — STROKE: EARLY STAGES OF RECOVERY BOOK
Freq: Once | Status: AC
  Administered 2014-10-03: 20:00:00
  Filled 2014-10-03: qty 1

## 2014-10-03 MED ORDER — INSULIN GLARGINE 100 UNIT/ML ~~LOC~~ SOLN
12.0000 [IU] | Freq: Every day | SUBCUTANEOUS | Status: DC
  Filled 2014-10-03 (×3): qty 0.12

## 2014-10-03 MED ORDER — HEPARIN SODIUM (PORCINE) 5000 UNIT/ML IJ SOLN
5000.0000 [IU] | Freq: Three times a day (TID) | INTRAMUSCULAR | Status: DC
  Administered 2014-10-03 – 2014-10-06 (×7): 5000 [IU] via SUBCUTANEOUS
  Filled 2014-10-03 (×7): qty 1

## 2014-10-03 MED ORDER — ASPIRIN 325 MG PO TABS
325.0000 mg | ORAL_TABLET | Freq: Every day | ORAL | Status: DC
  Administered 2014-10-04 – 2014-10-06 (×3): 325 mg via ORAL
  Filled 2014-10-03 (×3): qty 1

## 2014-10-03 NOTE — ED Notes (Signed)
Patient refused in and out catheter and to provide staff with a urine sample.  Dr. Effie ShyWentz made aware.

## 2014-10-03 NOTE — Progress Notes (Signed)
Patient's blood sugar 62 this evening. Patient was given orange juice and graham crackers and CBG rechecked after 15 minutes. Patient in no apparent distress and vital signs are stable. Will continue to monitor. Sharrod Achille, [name]

## 2014-10-03 NOTE — ED Notes (Addendum)
EMS - [facility] coming from [facility].  Patient LKN today at 08:20.  Patient walked to breakfast and when unresponsive at the table.  Patient exhibiting Left sided facial droop and left sided weakness.  Patient has an OAP in place, due having a mouth full of food and possible obstructed airway, had to be suctioned.

## 2014-10-03 NOTE — ED Notes (Signed)
Notified RN CBG [id number]

## 2014-10-03 NOTE — H&P (Signed)
Date: 10/03/2014               Patient Name:  Todd [name] MRN: [id number]  DOB: [birth year] Age / Sex: 80 y.o., male   PCP: Marletta Lor, NP         Medical Service: Internal Medicine Teaching Service         Attending Physician: Dr. Blanch Media    First Contact: Dr. Eleonore Chiquito Pager: [phone]  Second Contact: Dr. Boykin Peek Pager: [phone]       After Hours (After 5p/  First Contact Pager: [phone]  weekends / holidays): Second Contact Pager: [phone]   Chief Complaint: altered mental status first noted this morning at 8:45  History of Present Illness: Todd [name] is an 80 yo man currently living in an assisted living facility with a history of DMII, hypertension, hyperlipidemia, CAD s/p CABG, CKD Stage III and dementia who is presenting with altered mental status. He is a former smoker. Given his level of dementia, much of his history was collected using notes and details from the ALF.  He was last seen normal on his way to breakfast at his ALF at 8:30am. Then, at 8:45am, staff found him to be acutely unresponsive. EMS was activated and found him to slowly become responsive with new left-sided weakness. There was no trauma, seizure activity or choking noted at the scene. He did not complain of headache or any other symptoms at the time or at the time of interview in the hospital.  Of note, he had a similar, recent presentation to the hospital for altered mental status on 09/01/14. On that admission, it was determined that he likely had experienced a TIA. Better DMII control, the addition of plavix, and the discontinuation of seroquel were proposed at that time, but were never actually enacted as in- or outpatient. Two months ago, in 07/2014, he was hospitalized for syncope vs seizures.  Meds: No current facility-administered medications for this encounter.   Current Outpatient Prescriptions  Medication Sig Dispense Refill  . aspirin EC 325 MG tablet Take 325 mg by  mouth daily.    [name] atorvastatin (LIPITOR) 10 MG tablet Take 10 mg by mouth at bedtime.     . Cholecalciferol (VITAMIN D-3) 1000 UNITS CAPS Take 1,000 Units by mouth daily.    . hydrochlorothiazide (HYDRODIURIL) 25 MG tablet Take 25 mg by mouth daily.    . insulin glargine (LANTUS) 100 UNIT/ML injection Inject 6-10 Units into the skin 2 (two) times daily. 6 units subq every morning at 7am and 10 units at bedtime    . lisinopril (PRINIVIL,ZESTRIL) 10 MG tablet Take 10 mg by mouth daily.    . QUEtiapine (SEROQUEL) 50 MG tablet Take 50 mg by mouth 2 (two) times daily.    . vitamin B-12 (CYANOCOBALAMIN) 1000 MCG tablet Take 1,000 mcg by mouth daily.      Allergies: Allergies as of 10/03/2014  . (No Known Allergies)   Past Medical History  Diagnosis Date  . Elevated cholesterol   . Hypertension   . Type II diabetes mellitus   . Dementia     "don't know stage or type" (08/18/2014)  . Arthritis     "probably"  . Chronic kidney disease     "? stage" (08/18/2014)   Past Surgical History  Procedure Laterality Date  . Tonsillectomy    . Coronary artery bypass graft  ?1994    "CABG X3"  . Cataract extraction     No family history  on file. History   Social History  . Marital Status: Widowed    Spouse Name: N/A    Number of Children: N/A  . Years of Education: N/A   Occupational History  . Not on file.   Social History Main Topics  . Smoking status: Former Smoker    Types: Pipe  . Smokeless tobacco: Never Used     Comment: "quit smoking a pipe in the 80's"  . Alcohol Use: No  . Drug Use: No  . Sexual Activity: Not on file   Other Topics Concern  . Not on file   Social History Narrative    Review of Systems: Difficult to obtain ROS other than patient's recent fatigue  Physical Exam: Blood pressure 142/63, pulse 81, temperature 97.7 F (36.5 C), temperature source Oral, resp. rate 15, height 6\' 1"  (1.854 m), weight 183 lb (83.008 kg), SpO2 100 %. Appearance: in NAD,  lying in bed watching TV, patient says he is missing his glasses HEENT: AT/[place], no lymphadenopathy Heart: RRR, normal S1S2, no MRG, no carotid bruits Lungs: CTAB, no wheezes Abdomen: BS+, soft, nontender Musculoskeletal: normal range of motion Extremities: no edema b/l lower extremities Neurologic:  I: smell Not tested  II: visual acuity  OS: na OD: na, patient says he is seeing normally, just missing his glasses  II: visual fields intact  II: pupils Equal, round, reactive to light  III,VII: ptosis None  III,IV,VI: extraocular muscles  Full ROM  V: mastication Normal  V: facial light touch sensation  Normaland symmetric  V,VII: corneal reflex  Not tested  VII: facial muscle function - upper  Normal  VII: facial muscle function - lower Normal  VIII: hearing Normal  IX: soft palate elevation  Normal  IX,X: gag reflex Not tested  XI: trapezius strength  5/5  XI: sternocleidomastoid strength 5/5  XI: neck flexion strength  5/5  XII: tongue strength  [place] Strength UE and LE Coordination  Speech Normal Downgoing b/l 5/5 throughout Slowed, but intact on FNF (learning curve) No aphasia  Skin: no rashes or lesions   Lab results: Basic Metabolic Panel:  Recent Labs  40/98/1111/04/13 0920 10/03/14 0931  NA 139 140  K 3.9 3.7  CL 100 102  CO2 26  --   GLUCOSE 196* 197*  BUN 30* 36*  CREATININE 1.50* 1.60*  CALCIUM 9.7  --    Liver Function Tests:  Recent Labs  10/03/14 0920  AST 17  ALT 15  ALKPHOS 55  BILITOT 0.5  PROT 6.9  ALBUMIN 3.4*   CBC:  Recent Labs  10/03/14 0920 10/03/14 0931  WBC 6.5  --   NEUTROABS 2.6  --   HGB 11.8* 12.9*  HCT 34.5* 38.0*  MCV 91.3  --   PLT 180  --    CBG:  Recent Labs  10/03/14 1002  GLUCAP 171*   Coagulation:  Recent Labs  10/03/14 0920  LABPROT 14.6  INR 1.13   Alcohol Level:  Recent Labs  10/03/14 0920  ETH <11   Imaging results:  Ct Head Wo  Contrast  10/03/2014   CLINICAL DATA:  Code stroke  EXAM: CT HEAD WITHOUT CONTRAST  TECHNIQUE: Contiguous axial images were obtained from the base of the skull through the vertex without contrast.  COMPARISON:  08/31/2014  FINDINGS: Stable diffuse brain atrophy and chronic white matter microvascular ischemic changes. No acute intracranial hemorrhage, mass lesion, definite infarction, focal edema or mass effect. No extra-axial fluid collection, midline shift, herniation or  hydrocephalus. Normal gray-white matter differentiation maintained. Cisterns are patent. Cerebellar atrophy as well. Orbits are symmetric. Mastoids are clear. Bilateral ethmoid and left sphenoid sinus opacity appears chronic.  IMPRESSION: Stable brain atrophy and chronic white matter ischemic change. No acute finding or interval change by CT.  These results were called by telephone at the time of interpretation on 10/03/2014 at 9:43 am to Dr. Hosie Poisson , who verbally acknowledged these results.   Electronically Signed   By: Ruel Favors M.D.   On: 10/03/2014 09:42   Other results: EKG: Rate 72, NSR, poor study  Assessment & Plan by Problem: Active Problems:   Stroke  Todd [name] is an 80 yo male with DMII, HTN, HLD, CKD and dementia who presented to the ED with yet another episode of unresponsiveness this morning. This episode was accompanied by left-sided weakness. All of these symptoms appear to have resolved and initial NIHSS was 4; tPA was therefore held. Patient is stable.  CVA/TIA/Seizure: Patient had another episode of observed unresponsiveness, similar to his episode last month, this time accompanied by left-sided weakness, that was initially thought to have been a CVA, TIA or seizure. His symptoms have all resolved. Patient had EEG in 07/2014 that showed slowing c/w normal drowse that could not r/o slowing related to general cerebral disturbance such as metabolic encephalopathy; no epileptiform activity. However, patients with  dementia have a higher incidence of seizures. Last echo 07/2014: LVEF 55-60% with right ventricular systolic pressure c/w mild pulmonary htn. This was most likely another TIA. - MRI/MRA brain without pending - Carotid dopplers pending - EEG pending - Hold on echo at the moment, given that he had echo in 07/2014 - Tele - PT, OT, speech evaluate and treat - Continue ASA 325 mg (home medication)  - Frequent neuro checks - Swallow screen and aspiration precautions - Risk factor modification  DMII: Last A1c 07/2014 13.0%. On lantus  - HgbA1c pending - Continue home lantus   HLD: No lipid panel in our records. On home Lipitor 10 mg at bedtime. - Fasting lipid panel pending - Continue home lipitor  HTN: Currently 142/63. On home HCTZ 25 mg daily and lisinopril 10 mg daily. - Continue home HCTZ and lisinopril  CAD s/p CABG: Stable. - Repeat EKG given poor study tomorrow am  CKD Stage IIIA: Cr 1.60 and GFR 49. Baseline Cr ~1.50. - Continue to trend  Dementia: Baseline dementia and confusion as per patient's daughter. He has never seen a neurologist as an outpatient, though neuropsychiatry evaluation has been recommended at prior hospitalizations. Patient has been prescribed seroquel 50mg  in early October. This was found to be unnecessary at prior hospitalizations and it was recommended that its necessity be re-evaluated given its side effect profile. - Hold home seroquel 50 mg daily - Continue home vitamin B12  Outpatient Follow Up: It does not appear that this patient makes it to his PCP appointments between ED-Hospital admissions.  - Formulate better plan for outpatient care  Diet: - NPO until passes stroke swallow screen  DVT Ppx: - sq heparin - SCDs  Code Status: DNR/DNI  Dispo: Disposition is deferred at this time, awaiting improvement of current medical problems. Anticipated discharge in approximately 2-3 day(s). Patient lives at [facility]; on last discharge, SNF was  recommended.  The patient does have a current PCP Marletta Lor, NP) and does not need an [facility] hospital follow-up appointment after discharge.  The patient does have transportation limitations that hinder transportation to clinic appointments.  Signed:  Dionne AnoJulia Coren Crownover, MD 10/03/2014, 1:34 PM

## 2014-10-03 NOTE — Consult Note (Addendum)
Stroke Consult    Chief Complaint: altered mental status HPI: Philis FendtMilton Molnar is an 80 y.o. male presenting as a code stroke for a period of unresponsiveness. Per records and discussion with NH staff  he was sitting eating at breakfast at his facility when he acutely become unresponsive. This was around 8:45am, per staff he walked to breakfast around 8:30am and was his normal self. Initially per report EMS was unable to find pulse, but when he was placed supine, he had a pulse, and started to become responsive. He was unable to communicate his condition. EMS felt that he was weak on the left side, so he was made a code stroke. There is no report of choking, at the scene.  Per staff, he has some baseline dementia but is able to communicate and ambulate.  Head CT imaging reviewed and found to be overall unremarkable.   Date last known well: 10/03/2014 Time last known well: 0830 tPA Given: no, mildness of symptoms, initial NIHSS of 4 (speech, orientation and minimal right facial droop)  Past Medical History  Diagnosis Date  . Elevated cholesterol   . Hypertension   . Type II diabetes mellitus   . Dementia     "don't know stage or type" (08/18/2014)  . Arthritis     "probably"  . Chronic kidney disease     "? stage" (08/18/2014)    Past Surgical History  Procedure Laterality Date  . Tonsillectomy    . Coronary artery bypass graft  ?1994    "CABG X3"  . Cataract extraction      No family history on file. Social History:  reports that he has quit smoking. His smoking use included Pipe. He has never used smokeless tobacco. He reports that he does not drink alcohol or use illicit drugs.  Allergies: No Known Allergies   (Not in a hospital admission)  ROS: Out of a complete 14 system review, the patient complains of only the following symptoms, and all other reviewed systems are negative. +fatigue   Physical Examination: Filed Vitals:   10/03/14 0946  BP: 140/60  Pulse: 72   Temp: 97.7 F (36.5 C)  Resp: 13   Physical Exam  Constitutional: He appears well-developed and well-nourished.  Psych: Affect appropriate to situation Eyes: No scleral injection HENT: No OP obstrucion Head: Normocephalic.  Cardiovascular: Normal rate and regular rhythm.  Respiratory: Effort normal and breath sounds normal.  GI: Soft. Bowel sounds are normal. No distension. There is no tenderness.  Skin: WDI  Neurologic Examination: Mental Status: Alert, oriented to name only. Follows commands with repeated prompts. Mild receptive and moderate expressive aphasia.  Cranial Nerves: II: funduscopic exam wnl bilaterally, visual fields grossly normal, pupils equal, round, reactive to light and accommodation III,IV, VI: ptosis not present, extra-ocular motions intact bilaterally V,VII: minimal right facial weakness, facial light touch sensation normal bilaterally VIII: hearing normal bilaterally IX,X: gag reflex present XI: trapezius strength/neck flexion strength normal bilaterally XII: tongue strength normal  Motor: Right Upper extremity 5- to 5/5 Left Upper extremity limited proximal ROM but strength appears 5- to 5/5 Proximal bilateral LE 4+/5, distal 5/5 Sensory: Pinprick and light touch intact throughout, bilaterally Deep Tendon Reflexes: 1+ and symmetric throughout Plantars: Right: downgoing   Left: downgoing Cerebellar: normal finger-to-nose Gait: deferred due to multiple leads in the ED  Laboratory Studies:   Basic Metabolic Panel:  Recent Labs Lab 10/03/14 0931  NA 140  K 3.7  CL 102  GLUCOSE 197*  BUN 36*  CREATININE 1.60*    Liver Function Tests: No results for input(s): AST, ALT, ALKPHOS, BILITOT, PROT, ALBUMIN in the last 168 hours. No results for input(s): LIPASE, AMYLASE in the last 168 hours. No results for input(s): AMMONIA in the last 168 hours.  CBC:  Recent Labs Lab 10/03/14 0920 10/03/14 0931  WBC 6.5  --   NEUTROABS 2.6  --    HGB 11.8* 12.9*  HCT 34.5* 38.0*  MCV 91.3  --   PLT 180  --     Cardiac Enzymes: No results for input(s): CKTOTAL, CKMB, CKMBINDEX, TROPONINI in the last 168 hours.  BNP: Invalid input(s): POCBNP  CBG: No results for input(s): GLUCAP in the last 168 hours.  Microbiology: No results found for this or any previous visit.  Coagulation Studies:  Recent Labs  10/03/14 0920  LABPROT 14.6  INR 1.13    Urinalysis: No results for input(s): COLORURINE, LABSPEC, PHURINE, GLUCOSEU, HGBUR, BILIRUBINUR, KETONESUR, PROTEINUR, UROBILINOGEN, NITRITE, LEUKOCYTESUR in the last 168 hours.  Invalid input(s): APPERANCEUR  Lipid Panel:  No results found for: CHOL, TRIG, HDL, CHOLHDL, VLDL, LDLCALC  HgbA1C:  Lab Results  Component Value Date   HGBA1C 13.0* 08/18/2014    Urine Drug Screen:  No results found for: LABOPIA, COCAINSCRNUR, LABBENZ, AMPHETMU, THCU, LABBARB  Alcohol Level: No results for input(s): ETH in the last 168 hours.   Imaging: Ct Head Wo Contrast  10/03/2014   CLINICAL DATA:  Code stroke  EXAM: CT HEAD WITHOUT CONTRAST  TECHNIQUE: Contiguous axial images were obtained from the base of the skull through the vertex without contrast.  COMPARISON:  08/31/2014  FINDINGS: Stable diffuse brain atrophy and chronic white matter microvascular ischemic changes. No acute intracranial hemorrhage, mass lesion, definite infarction, focal edema or mass effect. No extra-axial fluid collection, midline shift, herniation or hydrocephalus. Normal gray-white matter differentiation maintained. Cisterns are patent. Cerebellar atrophy as well. Orbits are symmetric. Mastoids are clear. Bilateral ethmoid and left sphenoid sinus opacity appears chronic.  IMPRESSION: Stable brain atrophy and chronic white matter ischemic change. No acute finding or interval change by CT.  These results were called by telephone at the time of interpretation on 10/03/2014 at 9:43 am to Dr. Hosie Poissonsumner , who verbally  acknowledged these results.   Electronically Signed   By: Ruel Favorsrevor  Shick M.D.   On: 10/03/2014 09:42    Assessment: 80 y.o. male hx of HTN, HLD, DM, dementia, CKD presenting with period of acute unresponsiveness which slowly began to improve. Initial NIHSS of 4 so tPA held due to mildness of symptoms. Differential would include ischemic infarct vs syncopal episode vs seizure. Will admit for further workup.   Per discussion with daughter he has baseline dementia and is confused. She reports [facility] in the past multiple [facility] for similar episodes with an unclear etiology.   Plan: 1. HgbA1c, fasting lipid panel 2. MRI, MRA  of the brain without contrast 3. PT consult, OT consult, Speech consult 4. Echocardiogram 5. Carotid dopplers 6. Prophylactic therapy-continue ASA 325mg  7. Risk factor modification 8. Telemetry monitoring 9. Frequent neuro checks 10. NPO until RN stroke swallow screen 11. Check EEG   Elspeth Choeter Carina Chaplin, DO [facility] [phone]  If 7pm- 7am, please page neurology on call as listed in AMION. 10/03/2014, 10:01 AM

## 2014-10-03 NOTE — Plan of Care (Signed)
Problem: Acute Treatment Outcomes Goal: BP within ordered parameters Outcome: Completed/Met Date Met:  10/03/14 Goal: Airway maintained/protected Outcome: Completed/Met Date Met:  10/03/14 Goal: 02 Sats > 94% Outcome: Completed/Met Date Met:  10/03/14  Problem: Progression Outcomes Goal: Communication method established Outcome: Completed/Met Date Met:  10/03/14 Goal: If vent dependent, tolerates weaning Outcome: Not Applicable Date Met:  01/56/15 Goal: Pain controlled Outcome: Completed/Met Date Met:  10/03/14

## 2014-10-03 NOTE — ED Provider Notes (Addendum)
CSN: [id number]     Arrival date & time 10/03/14  0920 History   First MD Initiated Contact with Patient 10/03/14 [phone]     Chief Complaint  Patient presents with  . Code Stroke     (Consider location/radiation/quality/duration/timing/severity/associated sxs/prior Treatment) The history is provided by the EMS personnel.   Todd [name] is a 80 y.o. male who presents by EMS evaluation of "unresponsiveness".  EMS, received a call for unresponsiveness, while he was sitting eating at breakfast at his facility.  He reportedly was in his usual state of health until about one hour prior to admission, when he suddenly became unresponsive.  Initially EMS was unable to find pulse, but when he was placed supine, he had a pulse, and started to become responsive.  He was unable to communicate his condition.  EMS felt that he was weak on the left side, so he was made a code stroke.  There is no report of choking, at the scene.  Level V Caveat- altered mental status    Past Medical History  Diagnosis Date  . Elevated cholesterol   . Hypertension   . Type II diabetes mellitus   . Dementia     "don't know stage or type" (08/18/2014)  . Arthritis     "probably"  . Chronic kidney disease     "? stage" (08/18/2014)   Past Surgical History  Procedure Laterality Date  . Tonsillectomy    . Coronary artery bypass graft  ?1994    "CABG X3"  . Cataract extraction     No family history on file. History  Substance Use Topics  . Smoking status: Former Smoker    Types: Pipe  . Smokeless tobacco: Never Used     Comment: "quit smoking a pipe in the 80's"  . Alcohol Use: No    Review of Systems  Unable to perform ROS     Allergies  Review of patient's allergies indicates no known allergies.  Home Medications   Prior to Admission medications   Medication Sig Start Date End Date Taking? Authorizing Provider  aspirin EC 325 MG tablet Take 325 mg by mouth daily.   Yes Historical Provider, MD   atorvastatin (LIPITOR) 10 MG tablet Take 10 mg by mouth at bedtime.    Yes Historical Provider, MD  Cholecalciferol (VITAMIN D-3) 1000 UNITS CAPS Take 1,000 Units by mouth daily.   Yes Historical Provider, MD  hydrochlorothiazide (HYDRODIURIL) 25 MG tablet Take 25 mg by mouth daily.   Yes Historical Provider, MD  insulin glargine (LANTUS) 100 UNIT/ML injection Inject 6-10 Units into the skin 2 (two) times daily. 6 units subq every morning at 7am and 10 units at bedtime   Yes Historical Provider, MD  lisinopril (PRINIVIL,ZESTRIL) 10 MG tablet Take 10 mg by mouth daily.   Yes Historical Provider, MD  QUEtiapine (SEROQUEL) 50 MG tablet Take 50 mg by mouth 2 (two) times daily.   Yes Historical Provider, MD  vitamin B-12 (CYANOCOBALAMIN) 1000 MCG tablet Take 1,000 mcg by mouth daily.    Historical Provider, MD   BP 118/69 mmHg  Pulse 74  Temp(Src) 97.6 F (36.4 C) (Oral)  Resp 14  Ht 6\' 1"  (1.854 m)  Wt 183 lb (83.008 kg)  BMI 24.15 kg/m2  SpO2 99% Physical Exam  Constitutional: He appears well-developed.  Elderly, frail  HENT:  Head: Normocephalic and atraumatic.  Right Ear: External ear normal.  Left Ear: External ear normal.  He has some food particles in his  mouth.  Mucous membranes are dry.  Eyes: Conjunctivae and EOM are normal. Pupils are equal, round, and reactive to light.  Neck: Normal range of motion and phonation normal. Neck supple.  Cardiovascular: Normal rate, regular rhythm and normal heart sounds.   Pulmonary/Chest: Effort normal and breath sounds normal. He exhibits no bony tenderness.  Abdominal: Soft. There is no tenderness.  Musculoskeletal: Normal range of motion.  Neurological: He is alert. No cranial nerve deficit or sensory deficit. Coordination normal.  He is nonverbal.  He is able to follow simple command of moving eyes.  No evident nystagmus.  Flaccid paralysis of left arm and leg.  Skin: Skin is warm, dry and intact.  Nursing note and vitals  reviewed.   ED Course  Procedures (including critical care time)  On arrival, the patient was a code stroke patient.  He was seen quickly by the on-call stroke neurologist.  TPA decision deferred to stroke neurologist.  Medications   stroke: mapping our early stages of recovery book (not administered)  heparin injection 5,000 Units (not administered)  insulin aspart (novoLOG) injection 0-15 Units (not administered)    Patient Vitals for the past 24 hrs:  BP Temp Temp src Pulse Resp SpO2 Height Weight  10/03/14 1500 118/69 mmHg - - 74 14 99 % - -  10/03/14 1447 - 97.6 F (36.4 C) - - - - - -  10/03/14 1430 114/70 mmHg - - 78 16 98 % - -  10/03/14 1415 138/80 mmHg - - 82 15 100 % - -  10/03/14 1316 142/63 mmHg - - 81 15 100 % - -  10/03/14 1315 142/63 mmHg - - 81 13 100 % - -  10/03/14 1300 132/72 mmHg - - 79 12 100 % - -  10/03/14 1218 154/75 mmHg - - 87 15 100 % - -  10/03/14 1200 154/75 mmHg - - 79 16 100 % - -  10/03/14 1145 132/68 mmHg - - 81 14 100 % - -  10/03/14 1130 143/64 mmHg - - 83 17 100 % - -  10/03/14 1124 153/66 mmHg - - 85 18 99 % - -  10/03/14 1115 153/66 mmHg - - - 14 - - -  10/03/14 1100 153/62 mmHg - - - 16 - - -  10/03/14 1045 160/65 mmHg - - - 15 - - -  10/03/14 1030 126/59 mmHg - - - 16 - - -  10/03/14 1015 134/58 mmHg - - - 15 - - -  10/03/14 1000 152/65 mmHg - - - 16 - - -  10/03/14 0946 140/60 mmHg 97.7 F (36.5 C) Oral 72 13 100 % 6\' 1"  (1.854 m) 183 lb (83.008 kg)  10/03/14 0945 140/60 mmHg - - 73 13 97 % - -    11:56 AM Reevaluation with update and discussion. After initial assessment and treatment, an updated evaluation reveals he has more alert, and responsive now.  He speaks without dysarthria.  He is confused about the events today and his medical history.  He follows commands well, and has no asymmetry of strength in the arms.  Right leg strength is normal.  Left leg strength is less than right, and may have an effort related, decreased  movement. [name]   I discussed the case, with the on-call stroke neurologist.  He states an NIH score is 4, and thrombolysis is not indicated.  He states that the patient will be in the stroke window, for thrombolysis, until 1300 hrs. [name]  The patient did not have exacerbation of his symptoms, at 11:56.  11:59 AM-Consult complete with on-call [facility] resident. Patient case explained and discussed. She agrees to admit patient for further evaluation and treatment. Call ended at 13:15  CRITICAL CARE Performed by: Flint MelterWENTZ,Kendi Defalco L Total critical care time: 45 minutes Critical care time was exclusive of separately billable procedures and treating other patients. Critical care was necessary to treat or prevent imminent or life-threatening deterioration. Critical care was time spent personally by me on the following activities: development of treatment plan with patient and/or surrogate as well as nursing, discussions with consultants, evaluation of patient's response to treatment, examination of patient, obtaining history from patient or surrogate, ordering and performing treatments and interventions, ordering and review of laboratory studies, ordering and review of radiographic studies, pulse oximetry and re-evaluation of patient's condition.  Labs Review Labs Reviewed  CBC - Abnormal; Notable for the following:    RBC 3.78 (*)    Hemoglobin 11.8 (*)    HCT 34.5 (*)    All other components within normal limits  DIFFERENTIAL - Abnormal; Notable for the following:    Neutrophils Relative % 40 (*)    Lymphocytes Relative 51 (*)    All other components within normal limits  COMPREHENSIVE METABOLIC PANEL - Abnormal; Notable for the following:    Glucose, Bld 196 (*)    BUN 30 (*)    Creatinine, Ser 1.50 (*)    Albumin 3.4 (*)    GFR calc non Af Amer 42 (*)    GFR calc Af Amer 49 (*)    All other components within normal limits  I-STAT CHEM 8, ED - Abnormal; Notable for the following:    BUN  36 (*)    Creatinine, Ser 1.60 (*)    Glucose, Bld 197 (*)    Hemoglobin 12.9 (*)    HCT 38.0 (*)    All other components within normal limits  CBG MONITORING, ED - Abnormal; Notable for the following:    Glucose-Capillary 171 (*)    All other components within normal limits  ETHANOL  PROTIME-INR  APTT  URINE RAPID DRUG SCREEN (HOSP PERFORMED)  URINALYSIS, ROUTINE W REFLEX MICROSCOPIC  HEMOGLOBIN A1C  I-STAT TROPOININ, ED  I-STAT TROPOININ, ED    Imaging Review Ct Head Wo Contrast  10/03/2014   CLINICAL DATA:  Code stroke  EXAM: CT HEAD WITHOUT CONTRAST  TECHNIQUE: Contiguous axial images were obtained from the base of the skull through the vertex without contrast.  COMPARISON:  08/31/2014  FINDINGS: Stable diffuse brain atrophy and chronic white matter microvascular ischemic changes. No acute intracranial hemorrhage, mass lesion, definite infarction, focal edema or mass effect. No extra-axial fluid collection, midline shift, herniation or hydrocephalus. Normal gray-white matter differentiation maintained. Cisterns are patent. Cerebellar atrophy as well. Orbits are symmetric. Mastoids are clear. Bilateral ethmoid and left sphenoid sinus opacity appears chronic.  IMPRESSION: Stable brain atrophy and chronic white matter ischemic change. No acute finding or interval change by CT.  These results were called by telephone at the time of interpretation on 10/03/2014 at 9:43 am to Dr. Hosie Poissonsumner , who verbally acknowledged these results.   Electronically Signed   By: Ruel Favorsrevor  Shick M.D.   On: 10/03/2014 09:42     EKG Interpretation None          Date: 10/03/14  Rate: 72  Rhythm: indeterminate  QRS Axis: normal  PR and QT Intervals: indeterminate  ST/T Wave abnormalities: nonspecific ST/T changes  PR and  QRS Conduction Disutrbances:indeterminate  Narrative Interpretation: artifact prevents complete evaluation; repeat tracing is indicated  Old EKG Reviewed: changes noted- similar to  old   MDM   Final diagnoses:  Transient alteration of awareness  Weakness of left side of body    Nonspecific transient altered mental status and weakness.  No report of choking or seizing.  Symptoms improved spontaneously.  He will require additional evaluation, treatment and observation.  This will be in the hospitalized setting.   Nursing Notes Reviewed/ Care Coordinated, and agree without changes. Applicable Imaging Reviewed.  Interpretation of Laboratory Data incorporated into ED treatment  Plan: Admit    Flint MelterElliott L Hakim Minniefield, MD 10/03/14 1319  Flint MelterElliott L Gregg Winchell, MD 10/03/14 1352  Flint MelterElliott L Danaka Llera, MD 10/03/14 [phone]

## 2014-10-04 ENCOUNTER — Inpatient Hospital Stay (HOSPITAL_COMMUNITY): Payer: Medicare PPO

## 2014-10-04 DIAGNOSIS — I6300 Cerebral infarction due to thrombosis of unspecified precerebral artery: Secondary | ICD-10-CM

## 2014-10-04 DIAGNOSIS — I639 Cerebral infarction, unspecified: Secondary | ICD-10-CM | POA: Insufficient documentation

## 2014-10-04 DIAGNOSIS — G459 Transient cerebral ischemic attack, unspecified: Principal | ICD-10-CM

## 2014-10-04 DIAGNOSIS — F0390 Unspecified dementia without behavioral disturbance: Secondary | ICD-10-CM

## 2014-10-04 DIAGNOSIS — R569 Unspecified convulsions: Secondary | ICD-10-CM

## 2014-10-04 LAB — TROPONIN I: Troponin I: <0.3 ng/mL (ref <0.30)

## 2014-10-04 LAB — GLUCOSE, CAPILLARY
GLUCOSE-CAPILLARY: 232 mg/dL — AB (ref 70–99)
GLUCOSE-CAPILLARY: 156 mg/dL — AB (ref 70–99)
GLUCOSE-CAPILLARY: 205 mg/dL — AB (ref 70–99)
Glucose-Capillary: 206 mg/dL — ABNORMAL HIGH (ref 70–99)

## 2014-10-04 LAB — BASIC METABOLIC PANEL
ANION GAP: 13 (ref 5–15)
BUN: 25 mg/dL — ABNORMAL HIGH (ref 6–23)
CALCIUM: 9.3 mg/dL (ref 8.4–10.5)
CO2: 25 meq/L (ref 19–32)
CREATININE: 1.26 mg/dL (ref 0.50–1.35)
Chloride: 99 mEq/L (ref 96–112)
GFR, EST AFRICAN AMERICAN: 60 mL/min — AB (ref >90)
GFR, EST NON AFRICAN AMERICAN: 52 mL/min — AB (ref >90)
Glucose, Bld: 234 mg/dL — ABNORMAL HIGH (ref 70–99)
Potassium: 4 mEq/L (ref 3.7–5.3)
SODIUM: 137 meq/L (ref 137–147)

## 2014-10-04 LAB — HEMOGLOBIN A1C
Hgb A1c MFr Bld: 12.3 % — ABNORMAL HIGH (ref <5.7)
Mean Plasma Glucose: 306 mg/dL — ABNORMAL HIGH (ref <117)

## 2014-10-04 MED ORDER — INSULIN GLARGINE 100 UNIT/ML ~~LOC~~ SOLN
6.0000 [IU] | Freq: Two times a day (BID) | SUBCUTANEOUS | Status: DC
  Administered 2014-10-04 – 2014-10-06 (×4): 6 [IU] via SUBCUTANEOUS
  Filled 2014-10-04 (×5): qty 0.06

## 2014-10-04 MED ORDER — INSULIN ASPART 100 UNIT/ML ~~LOC~~ SOLN
0.0000 [IU] | Freq: Three times a day (TID) | SUBCUTANEOUS | Status: DC
  Administered 2014-10-04: 2 [IU] via SUBCUTANEOUS
  Administered 2014-10-05: 3 [IU] via SUBCUTANEOUS
  Administered 2014-10-05: 5 [IU] via SUBCUTANEOUS
  Administered 2014-10-05: 3 [IU] via SUBCUTANEOUS
  Administered 2014-10-06: 2 [IU] via SUBCUTANEOUS

## 2014-10-04 NOTE — Progress Notes (Signed)
Patient continuing to get out of bed, refusing to sit down, refusing vitals, refusing medicine. Patient pulled off condom catheter, pulling off telemetry leads. Patient refusing to stay in room. Patient calling staff "crazy". Dr. Leatha GildingMallory called and at bedside within 5 minutes. Patient's brother called (number for daughter inaccurate but now correct) and per patient's brother this behavior is within normal limits for him. Order for safety sitter, order to d/c telemetry. Patient currently seated at window seat at the end of 4N. I am sitting at the nearest desk keeping a close watch on patient until safety sitter arrives. Will continue to monitor.  Asher Muir- Ivann Trimarco,RN

## 2014-10-04 NOTE — Progress Notes (Signed)
EKG taken this morning but alert stated "muscle tremors" and therefore could not get a clear picture. Patient was lying very still, leads were in place, and no other machines were connected to him. Paper copy is in the chart. Todd [name], [name]

## 2014-10-04 NOTE — Progress Notes (Signed)
STROKE TEAM PROGRESS NOTE   HISTORY Todd [name] is an 80 y.o. male presenting as a code stroke for a period of unresponsiveness. Per records and discussion with NH staff he was sitting eating at breakfast at his facility when he acutely become unresponsive. This was around 8:45am 10/03/2014, per staff he walked to breakfast around 8:30am and was his normal self. Initially per report EMS was unable to find pulse, but when he was placed supine, he had a pulse, and started to become responsive. He was unable to communicate his condition. EMS felt that he was weak on the left side, so he was made a code stroke. There is no report of choking, at the scene.  Per staff, he has some baseline dementia but is able to communicate and ambulate.  Head CT imaging reviewed and found to be overall unremarkable.   Patient was not administered TPA secondary to  mildness of symptoms, initial NIHSS of 4 (speech, orientation and minimal right facial droop). He was admitted for further evaluation and treatment.   SUBJECTIVE (INTERVAL HISTORY) No family is at the bedside.  Overall he feels his condition is stable.    OBJECTIVE Temp:  [97.5 F (36.4 C)-98.4 F (36.9 C)] 97.9 F (36.6 C) (12/07 0946) Pulse Rate:  [60-87] 68 (12/07 0946) Cardiac Rhythm:  [-] Normal sinus rhythm (12/06 2010) Resp:  [12-20] 20 (12/07 0946) BP: (114-168)/(58-80) 168/77 mmHg (12/07 0946) SpO2:  [98 %-100 %] 100 % (12/07 0946)   Recent Labs Lab 10/03/14 1002 10/03/14 1645 10/03/14 2131 10/03/14 2143 10/04/14 0639  GLUCAP 171* 275* 62* 106* 232*    Recent Labs Lab 10/03/14 0920 10/03/14 0931 10/04/14 0553  NA 139 140 137  K 3.9 3.7 4.0  CL 100 102 99  CO2 26  --  25  GLUCOSE 196* 197* 234*  BUN 30* 36* 25*  CREATININE 1.50* 1.60* 1.26  CALCIUM 9.7  --  9.3    Recent Labs Lab 10/03/14 0920  AST 17  ALT 15  ALKPHOS 55  BILITOT 0.5  PROT 6.9  ALBUMIN 3.4*    Recent Labs Lab 10/03/14 0920  10/03/14 0931  WBC 6.5  --   NEUTROABS 2.6  --   HGB 11.8* 12.9*  HCT 34.5* 38.0*  MCV 91.3  --   PLT 180  --     Recent Labs Lab 10/03/14 2320 10/04/14 0553  TROPONINI <0.30 <0.30    Recent Labs  10/03/14 0920  LABPROT 14.6  INR 1.13    Recent Labs  10/03/14 1847  COLORURINE YELLOW  LABSPEC 1.023  PHURINE 5.0  GLUCOSEU >1000*  HGBUR NEGATIVE  BILIRUBINUR NEGATIVE  KETONESUR NEGATIVE  PROTEINUR NEGATIVE  UROBILINOGEN 1.0  NITRITE NEGATIVE  LEUKOCYTESUR MODERATE*       Component Value Date/Time   CHOL 192 10/03/2014 2026   TRIG 43 10/03/2014 2026   HDL 66 10/03/2014 2026   CHOLHDL 2.9 10/03/2014 2026   VLDL 9 10/03/2014 2026   LDLCALC 117* 10/03/2014 2026   Lab Results  Component Value Date   HGBA1C 13.0* 08/18/2014      Component Value Date/Time   LABOPIA NONE DETECTED 10/03/2014 1847   COCAINSCRNUR NONE DETECTED 10/03/2014 1847   LABBENZ NONE DETECTED 10/03/2014 1847   AMPHETMU NONE DETECTED 10/03/2014 1847   THCU NONE DETECTED 10/03/2014 1847   LABBARB NONE DETECTED 10/03/2014 1847     Recent Labs Lab 10/03/14 0920  ETH <11    Ct Head Wo Contrast  10/03/2014  CLINICAL DATA:  Code stroke  EXAM: CT HEAD WITHOUT CONTRAST  TECHNIQUE: Contiguous axial images were obtained from the base of the skull through the vertex without contrast.  COMPARISON:  08/31/2014  FINDINGS: Stable diffuse brain atrophy and chronic white matter microvascular ischemic changes. No acute intracranial hemorrhage, mass lesion, definite infarction, focal edema or mass effect. No extra-axial fluid collection, midline shift, herniation or hydrocephalus. Normal gray-white matter differentiation maintained. Cisterns are patent. Cerebellar atrophy as well. Orbits are symmetric. Mastoids are clear. Bilateral ethmoid and left sphenoid sinus opacity appears chronic.  IMPRESSION: Stable brain atrophy and chronic white matter ischemic change. No acute finding or interval change by CT.   These results were called by telephone at the time of interpretation on 10/03/2014 at 9:43 am to Dr. Hosie Poisson , who verbally acknowledged these results.   Electronically Signed   By: Ruel Favors M.D.   On: 10/03/2014 09:42   Mr Brain Wo Contrast  10/04/2014   CLINICAL DATA:  80 year old hypertensive diabetic male with history of dementia and chronic kidney disease presenting with unresponsiveness with left-sided weakness. Initial encounter.  EXAM: MRI HEAD WITHOUT CONTRAST  MRA HEAD WITHOUT CONTRAST  TECHNIQUE: Multiplanar, multiecho pulse sequences of the brain and surrounding structures were obtained without intravenous contrast. Angiographic images of the head were obtained using MRA technique without contrast.  COMPARISON:  10/03/2014 CT.  08/18/2014 MR.  FINDINGS: MRI HEAD FINDINGS  No acute infarct. Minimal increased signal superior aspect of the right lenticular nucleus without significant change and therefore not felt to represent an acute infarct.  No intracranial hemorrhage.  Mild small vessel disease type changes.  No intracranial mass lesion noted on this unenhanced exam.  Global atrophy without hydrocephalus.  Persistent opacification left ethmoid sinus air cells and left sphenoid sinus with restricted motion and therefore acute sinusitis is a possibility.  Minimal exophthalmos.  MRA HEAD FINDINGS  Exam is motion degraded.  Right internal carotid artery cavernous segment mild to moderate irregularity and narrowing  Left internal carotid artery cavernous segment mild narrowing and irregularity.  Carotid terminus mild irregularity and narrowing greater on the left.  High-grade stenosis M1 segment right middle cerebral artery with decrease number of visualized right middle cerebral artery branches. The right middle cerebral artery branch vessel which is visualized is significantly narrowed and irregular.  Mild to moderate narrowing and irregularity involving portions of the M1 segment and branches of the  left middle cerebral artery.  Mild to moderate narrowing of portions of the A1 segment and A2 segment of the anterior cerebral artery bilaterally.  Mild to moderate narrowing distal left vertebral artery.  Poor delineation of the left posterior inferior cerebellar artery.  Nonvisualized anterior inferior cerebellar artery early bilaterally.  Mild narrowing of the mid to distal aspect of the basilar artery.  Mild to moderate narrowing of the superior cerebellar artery greater on the left.  Marked focal stenosis proximal right posterior cerebral artery with moderate narrowing beyond this region.  Moderate narrowing P2 segment left posterior cerebral artery with mild moderate narrowing distal branches.  No aneurysm detected.  IMPRESSION: MRI HEAD  No acute infarct detected.  Mild small vessel disease type changes.  Global atrophy without hydrocephalus.  Persistent opacification left ethmoid sinus air cells and left sphenoid sinus with restricted motion and therefore acute sinusitis is a possibility.  MRA HEAD  Exam is motion degraded.  Significant atherosclerotic type changes. Most notable findings involve the right middle cerebral artery and right posterior cerebral artery including:  High-grade stenosis M1 segment right middle cerebral artery with decrease number of visualized right middle cerebral artery branches. The right middle cerebral artery branch vessel which is visualized is significantly narrowed and irregular.  Marked focal stenosis proximal right posterior cerebral artery.  Please see above for further detail.   Electronically Signed   By: Bridgett LarssonSteve  Olson M.D.   On: 10/04/2014 09:01   Mr Maxine GlennMra Head/brain Wo Cm  10/04/2014   CLINICAL DATA:  80 year old hypertensive diabetic male with history of dementia and chronic kidney disease presenting with unresponsiveness with left-sided weakness. Initial encounter.  EXAM: MRI HEAD WITHOUT CONTRAST  MRA HEAD WITHOUT CONTRAST  TECHNIQUE: Multiplanar, multiecho pulse  sequences of the brain and surrounding structures were obtained without intravenous contrast. Angiographic images of the head were obtained using MRA technique without contrast.  COMPARISON:  10/03/2014 CT.  08/18/2014 MR.  FINDINGS: MRI HEAD FINDINGS  No acute infarct. Minimal increased signal superior aspect of the right lenticular nucleus without significant change and therefore not felt to represent an acute infarct.  No intracranial hemorrhage.  Mild small vessel disease type changes.  No intracranial mass lesion noted on this unenhanced exam.  Global atrophy without hydrocephalus.  Persistent opacification left ethmoid sinus air cells and left sphenoid sinus with restricted motion and therefore acute sinusitis is a possibility.  Minimal exophthalmos.  MRA HEAD FINDINGS  Exam is motion degraded.  Right internal carotid artery cavernous segment mild to moderate irregularity and narrowing  Left internal carotid artery cavernous segment mild narrowing and irregularity.  Carotid terminus mild irregularity and narrowing greater on the left.  High-grade stenosis M1 segment right middle cerebral artery with decrease number of visualized right middle cerebral artery branches. The right middle cerebral artery branch vessel which is visualized is significantly narrowed and irregular.  Mild to moderate narrowing and irregularity involving portions of the M1 segment and branches of the left middle cerebral artery.  Mild to moderate narrowing of portions of the A1 segment and A2 segment of the anterior cerebral artery bilaterally.  Mild to moderate narrowing distal left vertebral artery.  Poor delineation of the left posterior inferior cerebellar artery.  Nonvisualized anterior inferior cerebellar artery early bilaterally.  Mild narrowing of the mid to distal aspect of the basilar artery.  Mild to moderate narrowing of the superior cerebellar artery greater on the left.  Marked focal stenosis proximal right posterior  cerebral artery with moderate narrowing beyond this region.  Moderate narrowing P2 segment left posterior cerebral artery with mild moderate narrowing distal branches.  No aneurysm detected.  IMPRESSION: MRI HEAD  No acute infarct detected.  Mild small vessel disease type changes.  Global atrophy without hydrocephalus.  Persistent opacification left ethmoid sinus air cells and left sphenoid sinus with restricted motion and therefore acute sinusitis is a possibility.  MRA HEAD  Exam is motion degraded.  Significant atherosclerotic type changes. Most notable findings involve the right middle cerebral artery and right posterior cerebral artery including:  High-grade stenosis M1 segment right middle cerebral artery with decrease number of visualized right middle cerebral artery branches. The right middle cerebral artery branch vessel which is visualized is significantly narrowed and irregular.  Marked focal stenosis proximal right posterior cerebral artery.  Please see above for further detail.   Electronically Signed   By: Bridgett LarssonSteve  Olson M.D.   On: 10/04/2014 09:01   Carotid Doppler  1-39% internal carotid artery stenosis bilaterally. The left vertebral artery is patent with antegrade flow. Unable to visualize the right  vertebral artery.  2D Echocardiogram  The right ventricular systolic pressure was increased consistent with mild pulmonary hypertension.  EEG pending   PHYSICAL EXAM Obese elderly african american lady not in distress. . Afebrile. Head is nontraumatic. Neck is supple without bruit.   Cardiac exam no murmur or gallop. Lungs are clear to auscultation. Distal pulses are well felt. Neurological Exam ;  Awake  Alert oriented x 1. Hesitant nonfluentl speech and diminished attention, registration and recall. Poor insight into her condition. Will follow only simple 1 step and midline commands. eye movements full without nystagmus.fundi were not visualized. Vision acuity and fields appear normal.  Hearing is normal. Palatal movements are normal. Face symmetric. Tongue midline. Normal strength, tone, reflexes and coordination. Normal sensation. Gait deferred. ASSESSMENT/PLAN Todd [name] is a 80 y.o. male with history of HTN, HLD, DM, mild dementia, and CKD who became unresponsive with reported pulselessness. He did not receive IV t-PA due to mild symptoms.   Brief period of unresponsiveness of unknown etiology. Doubt stroke.  MRI  No acute stroke  MRA  High grade R M1 stenosis in setting of diffuse cerebral atherosclerosis  Carotid Doppler  No significant stenosis   2D Echo  No source of embolus   EEG pending   Heparin 5000 units sq tid for VTE prophylaxis  Diet heart healthy/carb modified thin liquids  aspirin 325 mg orally every day prior to admission, now on aspirin 325 mg orally every day  Ongoing aggressive stroke risk factor management  Therapy recommendations:  No PT needs  Disposition:  Return to ALF  Hypertension  Permissive hypertension (OK if < 220/120) but gradually normalize in 5-7 days  Hyperlipidemia  Home meds:  lipitor 10, increased to 80 in hospital  LDL 117, goal < 70  Continue statin at discharge  Diabetes  HgbA1c 13.0 in Oct 2015, goal < 7.0  Uncontrolled  Other Stroke Risk Factors  Advanced age  Former cigarette smoker  Hx stroke/TIA - similar admission for altered mental status 09/01/14 felt to be TIA, plavix added.  CAD s/p CABG  Other Active Problems  CKD stage IIIa  Baseline dementia and confusion. Seroquel started on Oct now on hold.  Other Pertinent History  07/2014 admitted for syncope vs seizures  Hospital day # 1  Rhoderick Moody [facility] See Amion for Pager information 10/04/2014 12:06 PM  I have personally examined this patient, reviewed notes, independently viewed imaging studies, participated in medical decision making and plan of care. I have made any additions or clarifications directly  to the above note. Agree with note above.  Patient had a transient episode of unresponsiveness of unclear etiology. Stroke seems unlikely based on nonfocal exam and negative MRI.  Delia Heady, MD Medical Director [facility] Pager: [phone] 10/04/2014 5:30 PM    To contact Stroke Continuity provider, please refer to [email or web address]. After hours, contact General Neurology

## 2014-10-04 NOTE — Clinical Social Work Note (Signed)
CSW attempted to contacted pt's daughter Todd [name] at [phone], however this is not a working number. CSW contacted other identified members listed on the face sheet. Todd [name], Todd [name] reported they did not have [name]'s number, but will aid the CSW in locating it. CSW informed the bedside RN to asked for a working number when Lee Acreshristy call. CSW will continue to follow and assist with discharge needs.   Caroline Longie, MSW, LCSWA [phone]

## 2014-10-04 NOTE — Progress Notes (Signed)
CARE MANAGEMENT NOTE 10/04/2014  Patient:  Todd [name],Todd [name]   Account Number:  [id number]  Date Initiated:  10/04/2014  Documentation initiated by:  Jiles CrockerHANDLER,Antonio Creswell  Subjective/Objective Assessment:   ADMITTED WITH UNRESPONSIVENESS, CVA VS TIA     Action/Plan:   PATIENT RESIDES IN NURSING FACILITY   Anticipated DC Date:  10/08/2014   Anticipated DC Plan:  SKILLED NURSING FACILITY  In-house referral  Clinical Social Worker      DC Planning Services  CM consult         Status of service:  In process, will continue to follow Medicare Important Message given?   (If response is "NO", the following Medicare IM given date fields will be blank)  Per UR Regulation:  Reviewed for med. necessity/level of care/duration of stay  Comments:  12/7/2015Abelino Derrick- B Elery Cadenhead RN,BSN,MHA [phone]

## 2014-10-04 NOTE — Progress Notes (Signed)
PT Cancellation Note  Patient Details Name: Todd [name] MRN: [id number] DOB: 1931/11/01   Cancelled Treatment:    Reason Eval/Treat Not Completed: Patient at procedure or test/unavailable. Pt off floor for MRI. Will attempt to see later today.   Aqueelah Cotrell 10/04/2014, 8:22 AM Pager [phone]

## 2014-10-04 NOTE — Progress Notes (Signed)
  Date: 10/04/2014  Patient name: Todd FendtMilton Culliver  Medical record number: [id number]  Date of birth: [birth year]   I have seen and evaluated Todd [name] and discussed their care with the Residency Team. Todd [name] was admitted for unresponsiveness. He was a code stroke and full stroke W/U started.  Exam Vitals are stable HRRR LCTAB ABD + BS Ext no edema Ext +2 DP pulses B Alert, not oriented  Labs and imaging review.  Assessment and Plan: I have seen and evaluated the patient as outlined above. I agree with the formulated Assessment and Plan as detailed in the residents' admission note, with the following changes:   1. Unresponsive episode - This is the third episode in past few months. The differential inc   *TIA - his CT and MRI are negative for CVA. He has completed W/U for TIA inc negative carotid dopplers and ECHO from Oct admit. Will add plavix to ASA after running by neuro.  *Sz - he had an EEG done today and results are pending. Doubt this as etiology based on bystander description of sxs.  *Meds - we will hold the Seroquel as had been rec last admit for presumed TIA but not done. Do not know why this was started a few months ago but likely 2/2 dementia induced aggitation. We will use a sitter for behavior issues.  He also needs RF mgmt - DM and lipids.   2. Dementia - there is a concern that he will not be able to return to his ALF and will need SNF. Will involve case Production designer, theatre/television/filmmanager.  D/C likely 12/8 or 12/9.   Burns SpainElizabeth A Butcher, MD 12/7/20154:05 PM

## 2014-10-04 NOTE — Progress Notes (Signed)
Subjective: Todd [name] feels "fine" this morning. He is unsure why he is here.   Events: patient was combative with staff, saying his brother was going to come get him, that he didn't need to be here. Got up and left room, sitting in hallway on window ledge. I was called to assess. Patient had no new neurological symptoms at the time. Upon calling his brother, learned that patient has done this in the past, at baseline. Brother describes him as "stubborn". Brother able to provide extra information that patient lives in ALF, has had "blank outs" for 2-3 years. Also able to provide correct phone number for patient's daughter, Todd BonitoChristy [phone]. It is incorrect in the chart.  Objective: Vital signs in last 24 hours: Filed Vitals:   10/04/14 0418 10/04/14 0641 10/04/14 0946 10/04/14 1356  BP: 162/69 168/65 168/77 139/110  Pulse: 60 70 68   Temp: 98.2 F (36.8 C) 98 F (36.7 C) 97.9 F (36.6 C) 97.6 F (36.4 C)  TempSrc: Oral Oral Oral Axillary  Resp: 20 18 20 20   Height:      Weight:      SpO2: 100% 100% 100%    Weight change:   Intake/Output Summary (Last 24 hours) at 10/04/14 1728 Last data filed at 10/04/14 0500  Gross per 24 hour  Intake      0 ml  Output    400 ml  Net   -400 ml   Physical Exam: Appearance: in NAD, lying in bed watching TV with blinds drawn HEENT: AT/[place], no lymphadenopathy Heart: RRR, normal S1S2, no MRG, no carotid bruits Lungs: CTAB, no wheezes Abdomen: BS+, soft, nontender Musculoskeletal: normal range of motion Extremities: no edema b/l lower extremities Neurologic:  I: smell Not tested  II: visual acuity  OS: na OD: na, patient says he is seeing normally, just missing his glasses  II: visual fields intact  II: pupils Equal, round, reactive to light  III,VII: ptosis None  III,IV,VI: extraocular muscles  Full ROM  V: mastication Normal  V: facial light touch sensation  Normaland symmetric  V,VII: corneal  reflex  Not tested  VII: facial muscle function - upper  Normal  VII: facial muscle function - lower Normal  VIII: hearing Normal  IX: soft palate elevation  Normal  IX,X: gag reflex Not tested  XI: trapezius strength  5/5  XI: sternocleidomastoid strength 5/5  XI: neck flexion strength  5/5  XII: tongue strength  [place] Strength UE and LE Coordination  Speech Normal Downgoing b/l 5/5 throughout Slowed, some increased difficulty today, but intact on FNF (with learning curve) No aphasia  Skin: no rashes or lesions  Lab Results: Basic Metabolic Panel:  Recent Labs Lab 10/03/14 0920 10/03/14 0931 10/04/14 0553  NA 139 140 137  K 3.9 3.7 4.0  CL 100 102 99  CO2 26  --  25  GLUCOSE 196* 197* 234*  BUN 30* 36* 25*  CREATININE 1.50* 1.60* 1.26  CALCIUM 9.7  --  9.3   Liver Function Tests:  Recent Labs Lab 10/03/14 0920  AST 17  ALT 15  ALKPHOS 55  BILITOT 0.5  PROT 6.9  ALBUMIN 3.4*   CBC:  Recent Labs Lab 10/03/14 0920 10/03/14 0931  WBC 6.5  --   NEUTROABS 2.6  --   HGB 11.8* 12.9*  HCT 34.5* 38.0*  MCV 91.3  --   PLT 180  --    Cardiac Enzymes:  Recent Labs Lab 10/03/14 2320 10/04/14 0553 10/04/14  1150  TROPONINI <0.30 <0.30 <0.30   CBG:  Recent Labs Lab 10/03/14 1002 10/03/14 1645 10/03/14 2131 10/03/14 2143 10/04/14 0639  GLUCAP 171* 275* 62* 106* 232*   Hemoglobin A1C:  Recent Labs Lab 10/03/14 0920  HGBA1C 12.3*   Fasting Lipid Panel:  Recent Labs Lab 10/03/14 2026  CHOL 192  HDL 66  LDLCALC 117*  TRIG 43  CHOLHDL 2.9   Thyroid Function Tests:  Recent Labs Lab 10/03/14 2026  TSH 0.916   Coagulation:  Recent Labs Lab 10/03/14 0920  LABPROT 14.6  INR 1.13   Urine Drug Screen: Drugs of Abuse     Component Value Date/Time   LABOPIA NONE DETECTED 10/03/2014 1847   COCAINSCRNUR NONE DETECTED 10/03/2014 1847   LABBENZ NONE DETECTED 10/03/2014 1847   AMPHETMU  NONE DETECTED 10/03/2014 1847   THCU NONE DETECTED 10/03/2014 1847   LABBARB NONE DETECTED 10/03/2014 1847    Alcohol Level:  Recent Labs Lab 10/03/14 0920  ETH <11   Urinalysis:  Recent Labs Lab 10/03/14 1847  COLORURINE YELLOW  LABSPEC 1.023  PHURINE 5.0  GLUCOSEU >1000*  HGBUR NEGATIVE  BILIRUBINUR NEGATIVE  KETONESUR NEGATIVE  PROTEINUR NEGATIVE  UROBILINOGEN 1.0  NITRITE NEGATIVE  LEUKOCYTESUR MODERATE*   Studies/Results: Ct Head Wo Contrast  10/03/2014   CLINICAL DATA:  Code stroke  EXAM: CT HEAD WITHOUT CONTRAST  TECHNIQUE: Contiguous axial images were obtained from the base of the skull through the vertex without contrast.  COMPARISON:  08/31/2014  FINDINGS: Stable diffuse brain atrophy and chronic white matter microvascular ischemic changes. No acute intracranial hemorrhage, mass lesion, definite infarction, focal edema or mass effect. No extra-axial fluid collection, midline shift, herniation or hydrocephalus. Normal gray-white matter differentiation maintained. Cisterns are patent. Cerebellar atrophy as well. Orbits are symmetric. Mastoids are clear. Bilateral ethmoid and left sphenoid sinus opacity appears chronic.  IMPRESSION: Stable brain atrophy and chronic white matter ischemic change. No acute finding or interval change by CT.  These results were called by telephone at the time of interpretation on 10/03/2014 at 9:43 am to Dr. Hosie Poisson , who verbally acknowledged these results.   Electronically Signed   By: Ruel Favors M.D.   On: 10/03/2014 09:42   Mr Brain Wo Contrast  10/04/2014   CLINICAL DATA:  80 year old hypertensive diabetic male with history of dementia and chronic kidney disease presenting with unresponsiveness with left-sided weakness. Initial encounter.  EXAM: MRI HEAD WITHOUT CONTRAST  MRA HEAD WITHOUT CONTRAST  TECHNIQUE: Multiplanar, multiecho pulse sequences of the brain and surrounding structures were obtained without intravenous contrast. Angiographic  images of the head were obtained using MRA technique without contrast.  COMPARISON:  10/03/2014 CT.  08/18/2014 MR.  FINDINGS: MRI HEAD FINDINGS  No acute infarct. Minimal increased signal superior aspect of the right lenticular nucleus without significant change and therefore not felt to represent an acute infarct.  No intracranial hemorrhage.  Mild small vessel disease type changes.  No intracranial mass lesion noted on this unenhanced exam.  Global atrophy without hydrocephalus.  Persistent opacification left ethmoid sinus air cells and left sphenoid sinus with restricted motion and therefore acute sinusitis is a possibility.  Minimal exophthalmos.  MRA HEAD FINDINGS  Exam is motion degraded.  Right internal carotid artery cavernous segment mild to moderate irregularity and narrowing  Left internal carotid artery cavernous segment mild narrowing and irregularity.  Carotid terminus mild irregularity and narrowing greater on the left.  High-grade stenosis M1 segment right middle cerebral artery with decrease number of  visualized right middle cerebral artery branches. The right middle cerebral artery branch vessel which is visualized is significantly narrowed and irregular.  Mild to moderate narrowing and irregularity involving portions of the M1 segment and branches of the left middle cerebral artery.  Mild to moderate narrowing of portions of the A1 segment and A2 segment of the anterior cerebral artery bilaterally.  Mild to moderate narrowing distal left vertebral artery.  Poor delineation of the left posterior inferior cerebellar artery.  Nonvisualized anterior inferior cerebellar artery early bilaterally.  Mild narrowing of the mid to distal aspect of the basilar artery.  Mild to moderate narrowing of the superior cerebellar artery greater on the left.  Marked focal stenosis proximal right posterior cerebral artery with moderate narrowing beyond this region.  Moderate narrowing P2 segment left posterior  cerebral artery with mild moderate narrowing distal branches.  No aneurysm detected.  IMPRESSION: MRI HEAD  No acute infarct detected.  Mild small vessel disease type changes.  Global atrophy without hydrocephalus.  Persistent opacification left ethmoid sinus air cells and left sphenoid sinus with restricted motion and therefore acute sinusitis is a possibility.  MRA HEAD  Exam is motion degraded.  Significant atherosclerotic type changes. Most notable findings involve the right middle cerebral artery and right posterior cerebral artery including:  High-grade stenosis M1 segment right middle cerebral artery with decrease number of visualized right middle cerebral artery branches. The right middle cerebral artery branch vessel which is visualized is significantly narrowed and irregular.  Marked focal stenosis proximal right posterior cerebral artery.  Please see above for further detail.   Electronically Signed   By: Bridgett LarssonSteve  Olson M.D.   On: 10/04/2014 09:01   Mr Maxine GlennMra Head/brain Wo Cm  10/04/2014   CLINICAL DATA:  80 year old hypertensive diabetic male with history of dementia and chronic kidney disease presenting with unresponsiveness with left-sided weakness. Initial encounter.  EXAM: MRI HEAD WITHOUT CONTRAST  MRA HEAD WITHOUT CONTRAST  TECHNIQUE: Multiplanar, multiecho pulse sequences of the brain and surrounding structures were obtained without intravenous contrast. Angiographic images of the head were obtained using MRA technique without contrast.  COMPARISON:  10/03/2014 CT.  08/18/2014 MR.  FINDINGS: MRI HEAD FINDINGS  No acute infarct. Minimal increased signal superior aspect of the right lenticular nucleus without significant change and therefore not felt to represent an acute infarct.  No intracranial hemorrhage.  Mild small vessel disease type changes.  No intracranial mass lesion noted on this unenhanced exam.  Global atrophy without hydrocephalus.  Persistent opacification left ethmoid sinus air cells  and left sphenoid sinus with restricted motion and therefore acute sinusitis is a possibility.  Minimal exophthalmos.  MRA HEAD FINDINGS  Exam is motion degraded.  Right internal carotid artery cavernous segment mild to moderate irregularity and narrowing  Left internal carotid artery cavernous segment mild narrowing and irregularity.  Carotid terminus mild irregularity and narrowing greater on the left.  High-grade stenosis M1 segment right middle cerebral artery with decrease number of visualized right middle cerebral artery branches. The right middle cerebral artery branch vessel which is visualized is significantly narrowed and irregular.  Mild to moderate narrowing and irregularity involving portions of the M1 segment and branches of the left middle cerebral artery.  Mild to moderate narrowing of portions of the A1 segment and A2 segment of the anterior cerebral artery bilaterally.  Mild to moderate narrowing distal left vertebral artery.  Poor delineation of the left posterior inferior cerebellar artery.  Nonvisualized anterior inferior cerebellar artery early bilaterally.  Mild narrowing of the mid to distal aspect of the basilar artery.  Mild to moderate narrowing of the superior cerebellar artery greater on the left.  Marked focal stenosis proximal right posterior cerebral artery with moderate narrowing beyond this region.  Moderate narrowing P2 segment left posterior cerebral artery with mild moderate narrowing distal branches.  No aneurysm detected.  IMPRESSION: MRI HEAD  No acute infarct detected.  Mild small vessel disease type changes.  Global atrophy without hydrocephalus.  Persistent opacification left ethmoid sinus air cells and left sphenoid sinus with restricted motion and therefore acute sinusitis is a possibility.  MRA HEAD  Exam is motion degraded.  Significant atherosclerotic type changes. Most notable findings involve the right middle cerebral artery and right posterior cerebral artery  including:  High-grade stenosis M1 segment right middle cerebral artery with decrease number of visualized right middle cerebral artery branches. The right middle cerebral artery branch vessel which is visualized is significantly narrowed and irregular.  Marked focal stenosis proximal right posterior cerebral artery.  Please see above for further detail.   Electronically Signed   By: Bridgett Larsson M.D.   On: 10/04/2014 09:01   Medications: I have reviewed the patient's current medications. Scheduled Meds: . aspirin  325 mg Oral Daily  . atorvastatin  80 mg Oral q1800  . heparin  5,000 Units Subcutaneous 3 times per day  . insulin aspart  0-15 Units Subcutaneous TID WC  . insulin glargine  12 Units Subcutaneous QHS   Continuous Infusions:  PRN Meds:. Assessment/Plan: Active Problems:   Diabetes mellitus, type 2   CKD (chronic kidney disease)   Dementia   HTN (hypertension)   Dyslipidemia   Stroke   CVA (cerebral infarction)  Todd [name] is an 80 yo male with DMII, HTN, HLD, CKD and dementia who presented to the ED with yet another episode of unresponsiveness on 12/6. This episode was accompanied by left-sided weakness. All of these symptoms appear to have resolved and initial NIHSS was 4; tPA was therefore held. Patient is stable.  Unresponsive Episode: Patient had another episode of observed unresponsiveness, similar to his episode last month, this time accompanied by left-sided weakness, that was initially thought to have been a CVA, TIA or seizure. His symptoms have all resolved. Per brother, these "blank outs" have been occurring for the past 2-3 years. Patient had EEG in 07/2014 that showed slowing c/w normal drowse that could not r/o slowing related to general cerebral disturbance such as metabolic encephalopathy; no epileptiform activity. However, patients with dementia have a higher incidence of seizures. Last echo 07/2014: LVEF 55-60% with right ventricular systolic pressure c/w mild  pulmonary htn. This episode was most likely another TIA. MRI showed no acute stroke. MRA showed high grade R M1 stenosis in the setting of diffuse cerebral atherosclerosis. Carotid dopplers showed no significant stenosis. Echo showed no source of embolus. - EEG pending  - Tele has been d/c as patient removed it himself; no events - PT: no PT needs, recommend return to SNF - Speech recommends regular, thin liquid diet - OT: no follow up  - Continue ASA 325 mg (home medication)  - Frequent neuro checks - Risk factor modification  DMII: A1c 12.3%. On lantus. Goal is <7.0. - Continue home lantus  - Appreciate diabetes management recommendations, changed to sensitive and split lantus (6U am and 6U pm)  HLD: No lipid panel in our records. On this hospitalization, all WNL except for LDL 117 (goal <70). On home Lipitor 10  mg at bedtime. - Continue home lipitor at increased dose 80 mg daily  HTN: Currently 139/110. On home HCTZ 25 mg daily and lisinopril 10 mg daily. - Allowing for permissive hypertension; to be normalized over the next 5-7 days   CAD s/p CABG: Stable. Has had 2 poor EKGs with background noise. - Neurology note mentions plavix; confirm with neurology, as this was never formally started  CKD Stage IIIA: Cr 1.60-->1.26 and GFR 49. Baseline Cr ~1.50.  - Continue to trend  Dementia: Baseline dementia and confusion as per patient's daughter. He has never seen a neurologist as an outpatient, though neuropsychiatry evaluation has been recommended at prior hospitalizations. Patient has been prescribed seroquel 50mg  in early October. This was found to be unnecessary at prior hospitalizations and it was recommended that its necessity be re-evaluated given its side effect profile. - D/c home seroquel 50 mg daily - Continue home vitamin B12  Outpatient Follow Up: It does not appear that this patient makes it to his PCP appointments between ED-Hospital admissions.  - Formulate better plan  for outpatient care  Diet: - NPO until passes stroke swallow screen  DVT Ppx: - sq heparin - SCDs  Code Status: DNR/DNI  Dispo: Disposition is deferred at this time, awaiting improvement of current medical problems.  Anticipated discharge in approximately 1-2 day(s).   The patient does have a current PCP Marletta Lor, NP) and does not need an [facility] hospital follow-up appointment after discharge.  The patient does have transportation limitations that hinder transportation to clinic appointments.  .Services Needed at time of discharge: Y = Yes, Blank = No PT:   OT:   RN:   Equipment:   Other:     LOS: 1 day   Dionne Ano, MD 10/04/2014, 5:28 PM

## 2014-10-04 NOTE — Progress Notes (Signed)
EEG Completed; Results Pending  

## 2014-10-04 NOTE — Progress Notes (Signed)
CM CONSULT FOR [facility] REFERRAL  Per Raiford NobleAtika Hall [facility], [facility] and [facility] cannot follow the patient at discharge; Alexis GoodellB Quasim Doyon RN,BSN,MHA [phone]

## 2014-10-04 NOTE — Progress Notes (Signed)
Inpatient Diabetes Program Recommendations  AACE/ADA: New Consensus Statement on Inpatient Glycemic Control (2013)  Target Ranges:  Prepandial:   less than 140 mg/dL      Peak postprandial:   less than 180 mg/dL (1-2 hours)      Critically ill patients:  140 - 180 mg/dL   Reason for Assessment:  Results for Todd [name], Todd [name] (MRN [id number]) as of 10/04/2014 11:04  Ref. Range 10/03/2014 10:02 10/03/2014 16:45 10/03/2014 21:31 10/03/2014 21:43 10/04/2014 06:39  Glucose-Capillary Latest Range: 70-99 mg/dL 130171 (H) 865275 (H) 62 (L) 106 (H) 232 (H)   Diabetes history: Type 2 diabetes Outpatient Diabetes medications: Lantus 6 units q AM, Lantus 10 units q HS Current orders for Inpatient glycemic control:  Lantus 12 units q HS, and Novolog moderate tid with meals.  Note that Lantus was not given last night due to low CBG.  CBG increased this morning.  May consider decreasing Novolog correction to sensitive.  Also may consider splitting Lantus as per home dose.  Consider Lantus 6 units q AM and Lantus 6 units q PM (home dose is Lantus 10 units q HS).    Thanks, Todd MeagerJenny Malia Corsi, RN, BC-ADM Inpatient Diabetes Coordinator Pager [phone]

## 2014-10-04 NOTE — Progress Notes (Signed)
PT Cancellation Note  Patient Details Name: Philis FendtMilton Doubek MRN: [id number] DOB: 19-Jun-1932   Cancelled Treatment:    Reason Eval/Treat Not Completed: PT screened, no needs identified, will sign off  On arrival, pt standing in room 2 nurse techs present trying to calm pt and assist him to sit down. Security then arrived and able to partially calm pt down. Based on observation and discussion with RN, pt is mobilizing well (and unlikely pt can participate/cooperate with PT evaluation).     Deren Degrazia 10/04/2014, 3:24 PM Pager [phone]

## 2014-10-04 NOTE — Evaluation (Signed)
Occupational Therapy Evaluation and Discharge Patient Details Name: Philis FendtMilton Odell MRN: [id number] DOB: Apr 27, 1932 Today's Date: 10/04/2014    History of Present Illness pt presents with syncopal episode and hx of recent admit for the same.  pt has hx of Dementia.     Clinical Impression   This 80 yo male admitted with above presents to acute OT with cognitive deficits (pre-existing) so thus he needs S and he has this at the SNF. We will sign off.    Follow Up Recommendations  No OT follow up          Precautions / Restrictions Precautions Precautions: None Restrictions Weight Bearing Restrictions: No      Mobility Bed Mobility               General bed mobility comments: Pt up in hallway with nurse tech sitting on the bench at the end of the hallway  Transfers Overall transfer level: Needs assistance Equipment used: None Transfers: Sit to/from Stand Sit to Stand: Supervision         General transfer comment: S for all for safety due to decreased cognition--the only time he would be up with me was when I asked him to get a christmas decoration from the tree that was way up high--he stood up, got the ball down for me and sat right back down.         ADL Overall ADL's : Needs assistance/impaired                                       General ADL Comments: Feel based just on his mobility that he can do an ADL, it is just if he wants to/feels it is time from a cognitive perspective               Pertinent Vitals/Pain Pain Assessment: No/denies pain     Hand Dominance Right   Extremity/Trunk Assessment Upper Extremity Assessment Upper Extremity Assessment:  (Mild decrease in AROM of shoulders, right hand 2nd digit with contracture)   Lower Extremity Assessment Lower Extremity Assessment: Overall WFL for tasks assessed       Communication Communication Communication: No difficulties   Cognition Arousal/Alertness:  Awake/alert Behavior During Therapy: WFL for tasks assessed/performed Overall Cognitive Status: History of cognitive impairments - at baseline                                Home Living Family/patient expects to be discharged to:: Skilled nursing facility                                        Prior Functioning/Environment          Comments: Unclear PLOF as pt unable to provide information.      OT Diagnosis: Generalized weakness   OT Problem List: Decreased cognition      OT Goals(Current goals can be found in the care plan section) Acute Rehab OT Goals Patient Stated Goal: wanted to stay right where he was  OT Frequency:                End of Session Equipment Utilized During Treatment:  (none)  Activity Tolerance: Patient tolerated treatment well Patient left:  (sitting on bench with NT  Supervision)   Time: 1500-1510 OT Time Calculation (min): 10 min Charges:  OT General Charges $OT Visit: 1 Procedure OT Evaluation $Initial OT Evaluation Tier I: 1 Procedure  Evette GeorgesLeonard, Shanyiah Conde Eva [phone] 10/04/2014, 4:36 PM

## 2014-10-04 NOTE — Progress Notes (Signed)
*  PRELIMINARY RESULTS* Vascular Ultrasound Carotid Duplex (Doppler) has been completed.   Findings suggest 1-39% internal carotid artery stenosis bilaterally. The left vertebral artery is patent with antegrade flow. Unable to visualize the right vertebral artery.  10/04/2014 11:16 AM Gertie FeyMichelle Jenella Craigie, RVT, RDCS, RDMS

## 2014-10-04 NOTE — Evaluation (Signed)
Clinical/Bedside Swallow Evaluation Patient Details  Name: Todd FendtMilton Moorhouse MRN: [id number] Date of Birth: November 16, 1931  Today's Date: 10/04/2014 Time: 1350-1402 SLP Time Calculation (min) (ACUTE ONLY): 12 min  Past Medical History:  Past Medical History  Diagnosis Date  . Elevated cholesterol   . Hypertension   . Type II diabetes mellitus   . Dementia     "don't know stage or type" (08/18/2014)  . Arthritis     "probably"  . Chronic kidney disease     "? stage" (08/18/2014)   Past Surgical History:  Past Surgical History  Procedure Laterality Date  . Tonsillectomy    . Coronary artery bypass graft  ?1994    "CABG X3"  . Cataract extraction     HPI:  80 yo man currently living in an assisted living facility with a history of DMII, hypertension, hyperlipidemia, CAD s/p CABG, CKD Stage III and dementia admitted with altered mental status and new left-sided weakness. Of note, he had a similar, recent presentation to the hospital for altered mental status on 09/01/14 and was determined that he likely had experienced a TIA.  MRI No acute infarct detected. No current CXR.  Bedside swallow 08/19/14 resulted in functional swallow ability recommending regular texture and thin liquids.    Assessment / Plan / Recommendation Clinical Impression  Pt. standing beside bed holding catheter tubing as SLP entered room; initially cooperative then became increasingly restless (almost agitated).  Limited study as pt agreeable to cup and straw sips only; refusing solid texture.  No indications of pharyngeal difficulty with small trials liquid. RN reported no difficulty with solid or liquids thus far.  SLP recommends continuing regular texture and thin liquids.  A speech-language-cognitive assessment ordered as part of stroke order set.  Pt with history of dementia; MD speaking with pt's son presently and appears that son is confirming pt's baseline confusion and combativeness intermittently.  Do not recommend  performing full cognitive screen.  No further ST follow up; please reconsult if swallow status changes.    Aspiration Risk  Mild    Diet Recommendation Regular;Thin liquid   Liquid Administration via: Cup;Straw Medication Administration: Whole meds with liquid Supervision: Full supervision/cueing for compensatory strategies Compensations: Slow rate;Small sips/bites Postural Changes and/or Swallow Maneuvers: Seated upright 90 degrees;Upright 30-60 min after meal    Other  Recommendations Oral Care Recommendations: Oral care BID   Follow Up Recommendations  None    Frequency and Duration        Pertinent Vitals/Pain No indications         Swallow Study          Oral/Motor/Sensory Function Overall Oral Motor/Sensory Function:  (only protruded tongue slightly)   Ice Chips Ice chips: Not tested   Thin Liquid Thin Liquid: Within functional limits Presentation: Cup;Straw    Nectar Thick Nectar Thick Liquid: Not tested   Honey Thick Honey Thick Liquid: Not tested   Puree Puree: Not tested   Solid   GO    Solid:  (pt. refused)       Royce MacadamiaLitaker, Maricruz Lucero Willis 10/04/2014,2:19 PM   Breck CoonsLisa Willis Country WalkLitaker M.Ed [employer]SLP Pager [phone]

## 2014-10-05 DIAGNOSIS — R404 Transient alteration of awareness: Secondary | ICD-10-CM

## 2014-10-05 DIAGNOSIS — I10 Essential (primary) hypertension: Secondary | ICD-10-CM

## 2014-10-05 DIAGNOSIS — E1165 Type 2 diabetes mellitus with hyperglycemia: Secondary | ICD-10-CM

## 2014-10-05 DIAGNOSIS — I6310 Cerebral infarction due to embolism of unspecified precerebral artery: Secondary | ICD-10-CM

## 2014-10-05 DIAGNOSIS — E785 Hyperlipidemia, unspecified: Secondary | ICD-10-CM

## 2014-10-05 LAB — BASIC METABOLIC PANEL
Anion gap: 16 — ABNORMAL HIGH (ref 5–15)
BUN: 27 mg/dL — AB (ref 6–23)
CHLORIDE: 97 meq/L (ref 96–112)
CO2: 23 meq/L (ref 19–32)
Calcium: 10 mg/dL (ref 8.4–10.5)
Creatinine, Ser: 1.46 mg/dL — ABNORMAL HIGH (ref 0.50–1.35)
GFR calc Af Amer: 51 mL/min — ABNORMAL LOW (ref >90)
GFR calc non Af Amer: 44 mL/min — ABNORMAL LOW (ref >90)
Glucose, Bld: 248 mg/dL — ABNORMAL HIGH (ref 70–99)
Potassium: 4.1 mEq/L (ref 3.7–5.3)
SODIUM: 136 meq/L — AB (ref 137–147)

## 2014-10-05 LAB — GLUCOSE, CAPILLARY
GLUCOSE-CAPILLARY: 226 mg/dL — AB (ref 70–99)
GLUCOSE-CAPILLARY: 214 mg/dL — AB (ref 70–99)
Glucose-Capillary: 283 mg/dL — ABNORMAL HIGH (ref 70–99)
Glucose-Capillary: 181 mg/dL — ABNORMAL HIGH (ref 70–99)

## 2014-10-05 LAB — RPR

## 2014-10-05 LAB — VITAMIN B12: Vitamin B-12: >2000 pg/mL — ABNORMAL HIGH (ref 211–911)

## 2014-10-05 MED ORDER — ATORVASTATIN CALCIUM 80 MG PO TABS
80.0000 mg | ORAL_TABLET | Freq: Every day | ORAL | Status: DC
Start: 2014-10-05 — End: 2016-02-02

## 2014-10-05 NOTE — Clinical Social Work Psychosocial (Signed)
Clinical Social Work Department BRIEF PSYCHOSOCIAL ASSESSMENT 10/05/2014  Patient:  Todd [name],Todd [name]     Account Number:  [id number]     Admit date:  10/03/2014  Clinical Social Worker:  Todd [name],Todd [name], LCSWA  Date/Time:  10/05/2014 11:14 AM  Referred by:  RN  Date Referred:  10/04/2014 Referred for  SNF Placement   Other Referral:   Interview type:  Family Other interview type:   [name] Todd [name] [phone]; pt is disoriented    PSYCHOSOCIAL DATA Living Status:  FACILITY [name]  Level of care:  Assisted Living Primary support name:  Todd [name],Todd [name] Primary support relationship to patient:  CHILD, ADULT Degree of support available:   Strong Support System    CURRENT CONCERNS Current Concerns  Post-Acute Placement   Other Concerns:    SOCIAL WORK ASSESSMENT / PLAN CSW was able to contact the pt's daughter Todd [name]. Todd [name] reported not knowing why the CSW could not reach her yesterday. Todd [name] acknowledged her phone number as the one listed in the chart. CSW introduced self and purpose of the call. Todd [name] reported the [facility] is a [facility] at [name]. CSW discussed the clinical team's recommendations for rehab. CSW informed Todd [name] that CSW must confirm with the pt's ALF regarding accommodating his needs. Todd [name] reported understanding if they pt's  ALF cannot take him back. CSW and Todd [name] dicsussed the SNF. CSW explained the SNF process to Todd [name].  CSW answered all questions in which Todd [name] inquired about. CSW provided the Todd [name] with contact information for further questions. CSW will continue to follow this pt and assist with discharge as needed.   Assessment/plan status:  Psychosocial Support/Ongoing Assessment of Needs Other assessment/ plan:   Information/referral to community resources:    PATIENT'S/FAMILY'S RESPONSE TO PLAN OF CARE: Todd [name] expressed concerns regarding weather the pt needs to be placed at  a SNF long-term. Todd [name] reported not understanding why the pt has had several admissions. Todd [name] reported she will have a talk with the pt's ALF regarding weather the can accomendate the pt or if he needs another level of care.    Todd [name], MSW, LCSWA [phone]

## 2014-10-05 NOTE — Progress Notes (Signed)
Subjective: Patient feels well today; eager to go home. No headache, no pain, no numbness/tingling.  Objective: Vital signs in last 24 hours: Filed Vitals:   10/04/14 1739 10/04/14 2119 10/05/14 0102 10/05/14 0646  BP: 152/84 128/98 181/96 116/56  Pulse: 81 91 89 64  Temp: 98.5 F (36.9 C) 98.2 F (36.8 C) 98.9 F (37.2 C) 98.9 F (37.2 C)  TempSrc: Oral Oral Oral Oral  Resp: 20 19 18 16   Height:      Weight:      SpO2: 97% 96% 100% 100%   Weight change:   Intake/Output Summary (Last 24 hours) at 10/05/14 1610 Last data filed at 10/04/14 1912  Gross per 24 hour  Intake    120 ml  Output    300 ml  Net   -180 ml   Physical Exam: Appearance: in NAD, sitting in chair, sitter at bedside HEENT: AT/[place], no lymphadenopathy Heart: RRR, normal S1S2, no MRG, no carotid bruits Lungs: CTAB, no wheezes Abdomen: BS+, soft, nontender Musculoskeletal: normal range of motion Extremities: no edema b/l lower extremities Neurologic:  I: smell Not tested  II: visual acuity  OS: na OD: na, patient says he is seeing normally, just missing his glasses  II: visual fields intact  II: pupils Equal, round, reactive to light  III,VII: ptosis None  III,IV,VI: extraocular muscles  Full ROM  V: mastication Normal  V: facial light touch sensation  Normaland symmetric  V,VII: corneal reflex  Not tested  VII: facial muscle function - upper  Normal  VII: facial muscle function - lower Normal  VIII: hearing Normal  IX: soft palate elevation  Normal  IX,X: gag reflex Not tested  XI: trapezius strength  5/5  XI: sternocleidomastoid strength 5/5  XI: neck flexion strength  5/5  XII: tongue strength  [place] Strength UE and LE Coordination  Speech Normal Downgoing b/l 5/5 throughout Slowed, some increased difficulty today, but intact on FNF (with learning curve) No aphasia  Skin: no rashes or lesions  Lab  Results: Basic Metabolic Panel:  Recent Labs Lab 10/04/14 0553 10/05/14 0454  NA 137 136*  K 4.0 4.1  CL 99 97  CO2 25 23  GLUCOSE 234* 248*  BUN 25* 27*  CREATININE 1.26 1.46*  CALCIUM 9.3 10.0   Liver Function Tests:  Recent Labs Lab 10/03/14 0920  AST 17  ALT 15  ALKPHOS 55  BILITOT 0.5  PROT 6.9  ALBUMIN 3.4*   CBC:  Recent Labs Lab 10/03/14 0920 10/03/14 0931  WBC 6.5  --   NEUTROABS 2.6  --   HGB 11.8* 12.9*  HCT 34.5* 38.0*  MCV 91.3  --   PLT 180  --    Cardiac Enzymes:  Recent Labs Lab 10/03/14 2320 10/04/14 0553 10/04/14 1150  TROPONINI <0.30 <0.30 <0.30   CBG:  Recent Labs Lab 10/03/14 2131 10/03/14 2143 10/04/14 0639 10/04/14 1208 10/04/14 1731 10/04/14 2053  GLUCAP 62* 106* 232* 206* 156* 205*   Hemoglobin A1C:  Recent Labs Lab 10/03/14 0920  HGBA1C 12.3*   Fasting Lipid Panel:  Recent Labs Lab 10/03/14 2026  CHOL 192  HDL 66  LDLCALC 117*  TRIG 43  CHOLHDL 2.9   Thyroid Function Tests:  Recent Labs Lab 10/03/14 2026  TSH 0.916   Coagulation:  Recent Labs Lab 10/03/14 0920  LABPROT 14.6  INR 1.13   Urine Drug Screen: Drugs of Abuse     Component Value Date/Time   LABOPIA NONE DETECTED 10/03/2014 1847  COCAINSCRNUR NONE DETECTED 10/03/2014 1847   LABBENZ NONE DETECTED 10/03/2014 1847   AMPHETMU NONE DETECTED 10/03/2014 1847   THCU NONE DETECTED 10/03/2014 1847   LABBARB NONE DETECTED 10/03/2014 1847    Alcohol Level:  Recent Labs Lab 10/03/14 0920  ETH <11   Urinalysis:  Recent Labs Lab 10/03/14 1847  COLORURINE YELLOW  LABSPEC 1.023  PHURINE 5.0  GLUCOSEU >1000*  HGBUR NEGATIVE  BILIRUBINUR NEGATIVE  KETONESUR NEGATIVE  PROTEINUR NEGATIVE  UROBILINOGEN 1.0  NITRITE NEGATIVE  LEUKOCYTESUR MODERATE*   Studies/Results: Ct Head Wo Contrast  10/03/2014   CLINICAL DATA:  Code stroke  EXAM: CT HEAD WITHOUT CONTRAST  TECHNIQUE: Contiguous axial images were obtained from the base  of the skull through the vertex without contrast.  COMPARISON:  08/31/2014  FINDINGS: Stable diffuse brain atrophy and chronic white matter microvascular ischemic changes. No acute intracranial hemorrhage, mass lesion, definite infarction, focal edema or mass effect. No extra-axial fluid collection, midline shift, herniation or hydrocephalus. Normal gray-white matter differentiation maintained. Cisterns are patent. Cerebellar atrophy as well. Orbits are symmetric. Mastoids are clear. Bilateral ethmoid and left sphenoid sinus opacity appears chronic.  IMPRESSION: Stable brain atrophy and chronic white matter ischemic change. No acute finding or interval change by CT.  These results were called by telephone at the time of interpretation on 10/03/2014 at 9:43 am to Dr. Hosie Poisson , who verbally acknowledged these results.   Electronically Signed   By: Ruel Favors M.D.   On: 10/03/2014 09:42   Mr Brain Wo Contrast  10/04/2014   CLINICAL DATA:  80 year old hypertensive diabetic male with history of dementia and chronic kidney disease presenting with unresponsiveness with left-sided weakness. Initial encounter.  EXAM: MRI HEAD WITHOUT CONTRAST  MRA HEAD WITHOUT CONTRAST  TECHNIQUE: Multiplanar, multiecho pulse sequences of the brain and surrounding structures were obtained without intravenous contrast. Angiographic images of the head were obtained using MRA technique without contrast.  COMPARISON:  10/03/2014 CT.  08/18/2014 MR.  FINDINGS: MRI HEAD FINDINGS  No acute infarct. Minimal increased signal superior aspect of the right lenticular nucleus without significant change and therefore not felt to represent an acute infarct.  No intracranial hemorrhage.  Mild small vessel disease type changes.  No intracranial mass lesion noted on this unenhanced exam.  Global atrophy without hydrocephalus.  Persistent opacification left ethmoid sinus air cells and left sphenoid sinus with restricted motion and therefore acute sinusitis  is a possibility.  Minimal exophthalmos.  MRA HEAD FINDINGS  Exam is motion degraded.  Right internal carotid artery cavernous segment mild to moderate irregularity and narrowing  Left internal carotid artery cavernous segment mild narrowing and irregularity.  Carotid terminus mild irregularity and narrowing greater on the left.  High-grade stenosis M1 segment right middle cerebral artery with decrease number of visualized right middle cerebral artery branches. The right middle cerebral artery branch vessel which is visualized is significantly narrowed and irregular.  Mild to moderate narrowing and irregularity involving portions of the M1 segment and branches of the left middle cerebral artery.  Mild to moderate narrowing of portions of the A1 segment and A2 segment of the anterior cerebral artery bilaterally.  Mild to moderate narrowing distal left vertebral artery.  Poor delineation of the left posterior inferior cerebellar artery.  Nonvisualized anterior inferior cerebellar artery early bilaterally.  Mild narrowing of the mid to distal aspect of the basilar artery.  Mild to moderate narrowing of the superior cerebellar artery greater on the left.  Marked focal stenosis proximal right  posterior cerebral artery with moderate narrowing beyond this region.  Moderate narrowing P2 segment left posterior cerebral artery with mild moderate narrowing distal branches.  No aneurysm detected.  IMPRESSION: MRI HEAD  No acute infarct detected.  Mild small vessel disease type changes.  Global atrophy without hydrocephalus.  Persistent opacification left ethmoid sinus air cells and left sphenoid sinus with restricted motion and therefore acute sinusitis is a possibility.  MRA HEAD  Exam is motion degraded.  Significant atherosclerotic type changes. Most notable findings involve the right middle cerebral artery and right posterior cerebral artery including:  High-grade stenosis M1 segment right middle cerebral artery with  decrease number of visualized right middle cerebral artery branches. The right middle cerebral artery branch vessel which is visualized is significantly narrowed and irregular.  Marked focal stenosis proximal right posterior cerebral artery.  Please see above for further detail.   Electronically Signed   By: Bridgett LarssonSteve  Olson M.D.   On: 10/04/2014 09:01   Mr Maxine GlennMra Head/brain Wo Cm  10/04/2014   CLINICAL DATA:  80 year old hypertensive diabetic male with history of dementia and chronic kidney disease presenting with unresponsiveness with left-sided weakness. Initial encounter.  EXAM: MRI HEAD WITHOUT CONTRAST  MRA HEAD WITHOUT CONTRAST  TECHNIQUE: Multiplanar, multiecho pulse sequences of the brain and surrounding structures were obtained without intravenous contrast. Angiographic images of the head were obtained using MRA technique without contrast.  COMPARISON:  10/03/2014 CT.  08/18/2014 MR.  FINDINGS: MRI HEAD FINDINGS  No acute infarct. Minimal increased signal superior aspect of the right lenticular nucleus without significant change and therefore not felt to represent an acute infarct.  No intracranial hemorrhage.  Mild small vessel disease type changes.  No intracranial mass lesion noted on this unenhanced exam.  Global atrophy without hydrocephalus.  Persistent opacification left ethmoid sinus air cells and left sphenoid sinus with restricted motion and therefore acute sinusitis is a possibility.  Minimal exophthalmos.  MRA HEAD FINDINGS  Exam is motion degraded.  Right internal carotid artery cavernous segment mild to moderate irregularity and narrowing  Left internal carotid artery cavernous segment mild narrowing and irregularity.  Carotid terminus mild irregularity and narrowing greater on the left.  High-grade stenosis M1 segment right middle cerebral artery with decrease number of visualized right middle cerebral artery branches. The right middle cerebral artery branch vessel which is visualized is  significantly narrowed and irregular.  Mild to moderate narrowing and irregularity involving portions of the M1 segment and branches of the left middle cerebral artery.  Mild to moderate narrowing of portions of the A1 segment and A2 segment of the anterior cerebral artery bilaterally.  Mild to moderate narrowing distal left vertebral artery.  Poor delineation of the left posterior inferior cerebellar artery.  Nonvisualized anterior inferior cerebellar artery early bilaterally.  Mild narrowing of the mid to distal aspect of the basilar artery.  Mild to moderate narrowing of the superior cerebellar artery greater on the left.  Marked focal stenosis proximal right posterior cerebral artery with moderate narrowing beyond this region.  Moderate narrowing P2 segment left posterior cerebral artery with mild moderate narrowing distal branches.  No aneurysm detected.  IMPRESSION: MRI HEAD  No acute infarct detected.  Mild small vessel disease type changes.  Global atrophy without hydrocephalus.  Persistent opacification left ethmoid sinus air cells and left sphenoid sinus with restricted motion and therefore acute sinusitis is a possibility.  MRA HEAD  Exam is motion degraded.  Significant atherosclerotic type changes. Most notable findings involve the right  middle cerebral artery and right posterior cerebral artery including:  High-grade stenosis M1 segment right middle cerebral artery with decrease number of visualized right middle cerebral artery branches. The right middle cerebral artery branch vessel which is visualized is significantly narrowed and irregular.  Marked focal stenosis proximal right posterior cerebral artery.  Please see above for further detail.   Electronically Signed   By: Bridgett Larsson M.D.   On: 10/04/2014 09:01   Medications: I have reviewed the patient's current medications. Scheduled Meds: . aspirin  325 mg Oral Daily  . atorvastatin  80 mg Oral q1800  . heparin  5,000 Units Subcutaneous 3  times per day  . insulin aspart  0-9 Units Subcutaneous TID WC  . insulin glargine  6 Units Subcutaneous BID   Continuous Infusions:  PRN Meds:. Assessment/Plan: Active Problems:   Diabetes mellitus, type 2   CKD (chronic kidney disease)   Dementia   HTN (hypertension)   Dyslipidemia   Stroke   CVA (cerebral infarction)  Mr. Domine is an 80 yo male with DMII, HTN, HLD, CKD and dementia who presented to the ED with yet another episode of unresponsiveness on 12/6. This episode was accompanied by left-sided weakness by EMS report. All of these symptoms resolved by the time he was hospitalized and initial NIHSS was 4; tPA was therefore held. No recurrence in the hospital. Patient is stable.  Unresponsive Episode: Patient had another episode of observed unresponsiveness, similar to his episode last month, this time accompanied by left-sided weakness, that was initially thought to have been a CVA, TIA or seizure. His symptoms have all resolved. Per brother, these "blank outs" have been occurring for the past 2-3 years; per daughter, there was a spell of several of these "blank outs" about 3 years ago that then stopped. Patient had EEG in 07/2014 that showed slowing c/w normal drowse that could not r/o slowing related to general cerebral disturbance such as metabolic encephalopathy; no epileptiform activity. However, patients with dementia have a higher incidence of seizures. Last echo 07/2014: LVEF 55-60% with right ventricular systolic pressure c/w mild pulmonary htn. This episode may have been another TIA. MRI showed no acute stroke. MRA showed high grade R M1 stenosis in the setting of diffuse cerebral atherosclerosis. Carotid dopplers (preliminary) showed no significant stenosis. Echo showed no source of embolus. - EEG has been completed, but results are still pending; per neurology, can start anti-epileptics as outpatient if results positive (system down this week, so reads are slow and this is not  a reason to keep him in the hospital according to neurology) - Tele has been d/c as patient removed it himself; no events - PT: no PT needs, recommend return to SNF (patient is at ALF) - Speech recommends regular, thin liquid diet - OT: no follow up  - Continue ASA 325 mg (home medication)  - Frequent neuro checks - Risk factor modification  DMII: A1c 12.3%<--13.0% last month. On lantus. Goal is <7.0. - Continue home lantus  - Appreciate diabetes management recommendations, changed to sensitive and split lantus (6U am and 6U pm)  HLD: No lipid panel in our records. On this hospitalization, all WNL except for LDL 117 (goal <70). On home Lipitor 10 mg at bedtime. - Continue home lipitor at increased dose 80 mg daily  HTN: Currently 116/56. At home, on HCTZ 25 mg daily and lisinopril 10 mg daily. - Allowing for permissive hypertension; to be normalized over the next 5-7 days   CAD s/p  CABG: Stable. Has had 2 poor EKGs with background noise. - Neurology note mentions plavix; will start as outpatient   CKD Stage IIIA: Stable. Cr 1.60-->1.26-->1.46 and GFR 49. Baseline Cr ~1.50.   Dementia: Baseline dementia and confusion as per patient's daughter. He has never seen a neurologist as an outpatient, though neuropsychiatry evaluation has been recommended at prior hospitalizations. Patient has been prescribed seroquel 50mg  in early October. This was found to be unnecessary at prior hospitalizations and it was recommended that its necessity be re-evaluated given its side effect profile. - D/c home seroquel 50 mg daily - Continue home vitamin B12  Diet: - HH/carb mod  DVT Ppx: - sq heparin - SCDs  Code Status: DNR/DNI  Dispo: Disposition is deferred at this time, awaiting improvement of current medical problems.  Anticipated discharge in approximately 0 day(s). Patient's daughter is considering SNF; not ready to make the switch next, but perhaps if he continues to get admitted.  The  patient does have a current PCP Marletta Lor([name], NP) and [facility].  The patient does have transportation limitations that hinder transportation to clinic appointments.  .Services Needed at time of discharge: Y = Yes, Blank = No PT:   OT:   RN:   Equipment:   Other:     LOS: 2 days   Dionne AnoJulia Shayaan Parke, MD 10/05/2014, 7:22 AM

## 2014-10-05 NOTE — Progress Notes (Signed)
STROKE TEAM PROGRESS NOTE   HISTORY Todd [name] is an 80 y.o. male presenting as a code stroke for a period of unresponsiveness. Per records and discussion with NH staff he was sitting eating at breakfast at his facility when he acutely become unresponsive. This was around 8:45am 10/03/2014, per staff he walked to breakfast around 8:30am and was his normal self. Initially per report EMS was unable to find pulse, but when he was placed supine, he had a pulse, and started to become responsive. He was unable to communicate his condition. EMS felt that he was weak on the left side, so he was made a code stroke. There is no report of choking, at the scene.  Per staff, he has some baseline dementia but is able to communicate and ambulate.  Head CT imaging reviewed and found to be overall unremarkable.   Patient was not administered TPA secondary to  mildness of symptoms, initial NIHSS of 4 (speech, orientation and minimal right facial droop). He was admitted for further evaluation and treatment.   SUBJECTIVE (INTERVAL HISTORY) His sitter is at the bedside. He is up in the chair.   OBJECTIVE Temp:  [97.6 F (36.4 C)-98.9 F (37.2 C)] 97.6 F (36.4 C) (12/08 0950) Pulse Rate:  [64-93] 93 (12/08 0950) Cardiac Rhythm:  [-]  Resp:  [16-20] 16 (12/08 0950) BP: (116-181)/(56-110) 143/64 mmHg (12/08 0950) SpO2:  [96 %-100 %] 100 % (12/08 0950)   Recent Labs Lab 10/04/14 1208 10/04/14 1731 10/04/14 2053 10/05/14 0656 10/05/14 1141  GLUCAP 206* 156* 205* 226* 214*    Recent Labs Lab 10/03/14 0920 10/03/14 0931 10/04/14 0553 10/05/14 0454  NA 139 140 137 136*  K 3.9 3.7 4.0 4.1  CL 100 102 99 97  CO2 26  --  25 23  GLUCOSE 196* 197* 234* 248*  BUN 30* 36* 25* 27*  CREATININE 1.50* 1.60* 1.26 1.46*  CALCIUM 9.7  --  9.3 10.0    Recent Labs Lab 10/03/14 0920  AST 17  ALT 15  ALKPHOS 55  BILITOT 0.5  PROT 6.9  ALBUMIN 3.4*    Recent Labs Lab 10/03/14 0920  10/03/14 0931  WBC 6.5  --   NEUTROABS 2.6  --   HGB 11.8* 12.9*  HCT 34.5* 38.0*  MCV 91.3  --   PLT 180  --     Recent Labs Lab 10/03/14 2320 10/04/14 0553 10/04/14 1150  TROPONINI <0.30 <0.30 <0.30    Recent Labs  10/03/14 0920  LABPROT 14.6  INR 1.13    Recent Labs  10/03/14 1847  COLORURINE YELLOW  LABSPEC 1.023  PHURINE 5.0  GLUCOSEU >1000*  HGBUR NEGATIVE  BILIRUBINUR NEGATIVE  KETONESUR NEGATIVE  PROTEINUR NEGATIVE  UROBILINOGEN 1.0  NITRITE NEGATIVE  LEUKOCYTESUR MODERATE*       Component Value Date/Time   CHOL 192 10/03/2014 2026   TRIG 43 10/03/2014 2026   HDL 66 10/03/2014 2026   CHOLHDL 2.9 10/03/2014 2026   VLDL 9 10/03/2014 2026   LDLCALC 117* 10/03/2014 2026   Lab Results  Component Value Date   HGBA1C 12.3* 10/03/2014      Component Value Date/Time   LABOPIA NONE DETECTED 10/03/2014 1847   COCAINSCRNUR NONE DETECTED 10/03/2014 1847   LABBENZ NONE DETECTED 10/03/2014 1847   AMPHETMU NONE DETECTED 10/03/2014 1847   THCU NONE DETECTED 10/03/2014 1847   LABBARB NONE DETECTED 10/03/2014 1847     Recent Labs Lab 10/03/14 0920  ETH <11    Mr Brain  Wo Contrast  10/04/2014   CLINICAL DATA:  80 year old hypertensive diabetic male with history of dementia and chronic kidney disease presenting with unresponsiveness with left-sided weakness. Initial encounter.  EXAM: MRI HEAD WITHOUT CONTRAST  MRA HEAD WITHOUT CONTRAST  TECHNIQUE: Multiplanar, multiecho pulse sequences of the brain and surrounding structures were obtained without intravenous contrast. Angiographic images of the head were obtained using MRA technique without contrast.  COMPARISON:  10/03/2014 CT.  08/18/2014 MR.  FINDINGS: MRI HEAD FINDINGS  No acute infarct. Minimal increased signal superior aspect of the right lenticular nucleus without significant change and therefore not felt to represent an acute infarct.  No intracranial hemorrhage.  Mild small vessel disease type  changes.  No intracranial mass lesion noted on this unenhanced exam.  Global atrophy without hydrocephalus.  Persistent opacification left ethmoid sinus air cells and left sphenoid sinus with restricted motion and therefore acute sinusitis is a possibility.  Minimal exophthalmos.  MRA HEAD FINDINGS  Exam is motion degraded.  Right internal carotid artery cavernous segment mild to moderate irregularity and narrowing  Left internal carotid artery cavernous segment mild narrowing and irregularity.  Carotid terminus mild irregularity and narrowing greater on the left.  High-grade stenosis M1 segment right middle cerebral artery with decrease number of visualized right middle cerebral artery branches. The right middle cerebral artery branch vessel which is visualized is significantly narrowed and irregular.  Mild to moderate narrowing and irregularity involving portions of the M1 segment and branches of the left middle cerebral artery.  Mild to moderate narrowing of portions of the A1 segment and A2 segment of the anterior cerebral artery bilaterally.  Mild to moderate narrowing distal left vertebral artery.  Poor delineation of the left posterior inferior cerebellar artery.  Nonvisualized anterior inferior cerebellar artery early bilaterally.  Mild narrowing of the mid to distal aspect of the basilar artery.  Mild to moderate narrowing of the superior cerebellar artery greater on the left.  Marked focal stenosis proximal right posterior cerebral artery with moderate narrowing beyond this region.  Moderate narrowing P2 segment left posterior cerebral artery with mild moderate narrowing distal branches.  No aneurysm detected.  IMPRESSION: MRI HEAD  No acute infarct detected.  Mild small vessel disease type changes.  Global atrophy without hydrocephalus.  Persistent opacification left ethmoid sinus air cells and left sphenoid sinus with restricted motion and therefore acute sinusitis is a possibility.  MRA HEAD  Exam is  motion degraded.  Significant atherosclerotic type changes. Most notable findings involve the right middle cerebral artery and right posterior cerebral artery including:  High-grade stenosis M1 segment right middle cerebral artery with decrease number of visualized right middle cerebral artery branches. The right middle cerebral artery branch vessel which is visualized is significantly narrowed and irregular.  Marked focal stenosis proximal right posterior cerebral artery.  Please see above for further detail.   Electronically Signed   By: Bridgett LarssonSteve  Olson M.D.   On: 10/04/2014 09:01   Mr Maxine GlennMra Head/brain Wo Cm  10/04/2014   CLINICAL DATA:  80 year old hypertensive diabetic male with history of dementia and chronic kidney disease presenting with unresponsiveness with left-sided weakness. Initial encounter.  EXAM: MRI HEAD WITHOUT CONTRAST  MRA HEAD WITHOUT CONTRAST  TECHNIQUE: Multiplanar, multiecho pulse sequences of the brain and surrounding structures were obtained without intravenous contrast. Angiographic images of the head were obtained using MRA technique without contrast.  COMPARISON:  10/03/2014 CT.  08/18/2014 MR.  FINDINGS: MRI HEAD FINDINGS  No acute infarct. Minimal increased signal superior  aspect of the right lenticular nucleus without significant change and therefore not felt to represent an acute infarct.  No intracranial hemorrhage.  Mild small vessel disease type changes.  No intracranial mass lesion noted on this unenhanced exam.  Global atrophy without hydrocephalus.  Persistent opacification left ethmoid sinus air cells and left sphenoid sinus with restricted motion and therefore acute sinusitis is a possibility.  Minimal exophthalmos.  MRA HEAD FINDINGS  Exam is motion degraded.  Right internal carotid artery cavernous segment mild to moderate irregularity and narrowing  Left internal carotid artery cavernous segment mild narrowing and irregularity.  Carotid terminus mild irregularity and  narrowing greater on the left.  High-grade stenosis M1 segment right middle cerebral artery with decrease number of visualized right middle cerebral artery branches. The right middle cerebral artery branch vessel which is visualized is significantly narrowed and irregular.  Mild to moderate narrowing and irregularity involving portions of the M1 segment and branches of the left middle cerebral artery.  Mild to moderate narrowing of portions of the A1 segment and A2 segment of the anterior cerebral artery bilaterally.  Mild to moderate narrowing distal left vertebral artery.  Poor delineation of the left posterior inferior cerebellar artery.  Nonvisualized anterior inferior cerebellar artery early bilaterally.  Mild narrowing of the mid to distal aspect of the basilar artery.  Mild to moderate narrowing of the superior cerebellar artery greater on the left.  Marked focal stenosis proximal right posterior cerebral artery with moderate narrowing beyond this region.  Moderate narrowing P2 segment left posterior cerebral artery with mild moderate narrowing distal branches.  No aneurysm detected.  IMPRESSION: MRI HEAD  No acute infarct detected.  Mild small vessel disease type changes.  Global atrophy without hydrocephalus.  Persistent opacification left ethmoid sinus air cells and left sphenoid sinus with restricted motion and therefore acute sinusitis is a possibility.  MRA HEAD  Exam is motion degraded.  Significant atherosclerotic type changes. Most notable findings involve the right middle cerebral artery and right posterior cerebral artery including:  High-grade stenosis M1 segment right middle cerebral artery with decrease number of visualized right middle cerebral artery branches. The right middle cerebral artery branch vessel which is visualized is significantly narrowed and irregular.  Marked focal stenosis proximal right posterior cerebral artery.  Please see above for further detail.   Electronically Signed    By: Bridgett LarssonSteve  Olson M.D.   On: 10/04/2014 09:01   Carotid Doppler  1-39% internal carotid artery stenosis bilaterally. The left vertebral artery is patent with antegrade flow. Unable to visualize the right vertebral artery.  2D Echocardiogram  The right ventricular systolic pressure was increased consistent with mild pulmonary hypertension.  EEG this is a technically limited (excessive amount of artifacts throughout the entire recording) normal awake EEG. If clinically indicated, a repeat study with sleep deprivation will be prudent.    PHYSICAL EXAM Obese elderly african american lady not in distress. . Afebrile. Head is nontraumatic. Neck is supple without bruit.   Cardiac exam no murmur or gallop. Lungs are clear to auscultation. Distal pulses are well felt. Neurological Exam ;  Awake  Alert oriented x 1. Hesitant nonfluentl speech and diminished attention, registration and recall. Poor insight into her condition. Will follow only simple 1 step and midline commands. eye movements full without nystagmus.fundi were not visualized. Vision acuity and fields appear normal. Hearing is normal. Palatal movements are normal. Face symmetric. Tongue midline. Normal strength, tone, reflexes and coordination. Normal sensation. Gait deferred.   ASSESSMENT/PLAN  Todd [name] is a 80 y.o. male with history of HTN, HLD, DM, mild dementia, and CKD who became unresponsive with reported pulselessness. He did not receive IV t-PA due to mild symptoms.   Brief period of unresponsiveness of unknown etiology. Doubt stroke.  MRI  No acute stroke  MRA  High grade R M1 stenosis in setting of diffuse cerebral atherosclerosis  Carotid Doppler  No significant stenosis   2D Echo  No source of embolus   EEG no seizure  Heparin 5000 units sq tid for VTE prophylaxis  Diet heart healthy/carb modified thin liquids  aspirin 325 mg orally every day prior to admission, now on aspirin 325 mg orally every  day  Ongoing aggressive stroke risk factor management  Therapy recommendations:  No PT needs  Disposition:  Ok to return to ALF from stroke standpoint  No neuro follow up necessary  Hypertension  Permissive hypertension (OK if < 220/120) but gradually normalize in 5-7 days  Hyperlipidemia  Home meds:  lipitor 10, increased to 80 in hospital  LDL 117, goal < 70  Continue statin at discharge  Diabetes  HgbA1c 13.0 in Oct 2015, goal < 7.0  Uncontrolled  Other Stroke Risk Factors  Advanced age  Former cigarette smoker  Hx stroke/TIA - similar admission for altered mental status 09/01/14 felt to be TIA, plavix added.  CAD s/p CABG  Other Active Problems  CKD stage IIIa  Baseline dementia and confusion. Seroquel started on Oct now on hold.  Other Pertinent History  07/2014 admitted for syncope vs seizures  Hospital day # 2  Rhoderick Moody [facility] See Amion for Pager information 10/05/2014 1:48 PM  I have personally examined this patient, reviewed notes, independently viewed imaging studies, participated in medical decision making and plan of care. I have made any additions or clarifications directly to the above note. Agree with note above. Stroke team will sign off. Kindly call for questions.  Delia Heady, MD Medical Director [facility] Pager: [phone] 10/05/2014 7:46 PM    To contact Stroke Continuity provider, please refer to [email or web address]. After hours, contact General Neurology

## 2014-10-05 NOTE — Procedures (Signed)
EEG report.  Brief clinical history: [name] is an 80 yo male with DMII, HTN, HLD, CKD and dementia who presented to the ED with yet another episode of unresponsiveness on 12/6. This episode was accompanied by left-sided weakness. All of these symptoms appear to have resolved. Concern for seizures.   Technique: this is a 17 channel routine scalp EEG performed at the bedside with bipolar and monopolar montages arranged in accordance to the international 10/20 system of electrode placement. One channel was dedicated to EKG recording.  No sleep or drowsiness was achieved. No activating procedures performed.  Description: the study was obscured by significant amount of artifacts throughout the entire recording. However, the readable portion of teh study showed a best background of 12 Hz that appears to be symmetric, well sustained, and reactive. No focal or generalized epileptiform discharges noted.  No pathologic areas of slowing seen.  EKG showed sinus rhythm.   Impression: this is a technically limited (excessive amount of artifacts throughout the entire recording) normal awake EEG. If clinically indicated, a repeat study with sleep deprivation will be prudent. Please, be aware that a normal EEG does not exclude the possibility of epilepsy.  Clinical correlation is advised.   Wyatt Portelasvaldo Arieanna Pressey, MD

## 2014-10-05 NOTE — Plan of Care (Signed)
Problem: Acute Treatment Outcomes Goal: Hemodynamically stable Outcome: Completed/Met Date Met:  10/05/14  Problem: Progression Outcomes Goal: Progressive activity as tolerated Outcome: Completed/Met Date Met:  10/05/14 Goal: Tolerating diet/TF at goal rate Outcome: Completed/Met Date Met:  10/05/14 Goal: Bowel & Bladder Continence Outcome: Completed/Met Date Met:  10/05/14 Goal: Initial discharge plan initiated Outcome: Completed/Met Date Met:  10/05/14

## 2014-10-05 NOTE — Progress Notes (Addendum)
  Date: 10/05/2014  Patient name: Todd [name]  Medical record number: [id number]  Date of birth: 07/04/1932   This patient has been seen and the plan of care was discussed with the house staff. Please see their note for complete details. I concur with their findings with the following additions/corrections: [name] was sitting in his recliner. He is happy and had no complaints. He was pleased that he is being D/C'd today. He is pleasantly demented. We discussed Nemenda on rounds this AM and will leave decision up to PCP who will know whether pt has tried this med in past and whether he had side effects. Namenda may cause dizziness. Confusion and hallucinations may occurred but are less common but can increase agitation and delusional behaviors in some patients with AD. He has had aggititiaon and has required a sitter in hospital. And we have stopped seroquel 2/2 possible contribution to freq episodes of unconsciousness requiring hospitalization. At this point, risks outweigh potential benefits of starting namenda.  Burns SpainElizabeth A Shenica Holzheimer, MD 10/05/2014, 11:48 AM

## 2014-10-05 NOTE — Progress Notes (Signed)
Inpatient Diabetes Program Recommendations  AACE/ADA: New Consensus Statement on Inpatient Glycemic Control (2013)  Target Ranges:  Prepandial:   less than 140 mg/dL      Peak postprandial:   less than 180 mg/dL (1-2 hours)      Critically ill patients:  140 - 180 mg/dL   Reason for Visit: Hyperglycemia Diabetes history: DM2 Outpatient Diabetes medications: Lantus 6 units in am and 10 units QHS Current orders for Inpatient glycemic control: Lantus 6 units bid, Novolog sensitive tidwc Results for Todd [name], Todd [name] (MRN [id number]) as of 10/05/2014 09:06  Ref. Range 10/03/2014 09:20  Hgb A1c MFr Bld Latest Range: <5.7 % 12.3 (H)  Results for Todd [name], Todd [name] (MRN [id number]) as of 10/05/2014 09:06  Ref. Range 10/04/2014 06:39 10/04/2014 12:08 10/04/2014 17:31 10/04/2014 20:53  Glucose-Capillary Latest Range: 70-99 mg/dL 562232 (H) 130206 (H) 865156 (H) 205 (H)     Inpatient Diabetes Program Recommendations Insulin - Basal: Increase Lantus to 6 units in am and 10 units QHS Correction (SSI): Add HS coverage HgbA1C: 12.3% - uncontrolled  Will need insulin adjustments to current home regimen prior to discharge.  Note: Will continue to follow. Thank you. Ailene Ardshonda Deborh Pense, RD, LDN, CDE Inpatient Diabetes Coordinator [phone]

## 2014-10-05 NOTE — Progress Notes (Signed)
CSW was called regarding D/C order.  [facility]  She will f/u in the am.

## 2014-10-05 NOTE — Discharge Summary (Signed)
Name: Todd [name] MRN: [id number] DOB: 03/26/1932 80 y.o. PCP: Marletta LorJulie Barr, NP  Date of Admission: 10/03/2014  9:21 AM Date of Discharge: 10/05/2014 Attending Physician: Burns SpainElizabeth A Butcher, MD  Discharge Diagnosis: Active Problems:   Diabetes mellitus, type 2   CKD (chronic kidney disease)   Dementia   HTN (hypertension)   Dyslipidemia   Stroke   CVA (cerebral infarction)  Discharge Medications:   Medication List    STOP taking these medications        QUEtiapine 50 MG tablet  Commonly known as:  SEROQUEL      TAKE these medications        aspirin EC 325 MG tablet  Take 325 mg by mouth daily.     atorvastatin 80 MG tablet  Commonly known as:  LIPITOR  Take 1 tablet (80 mg total) by mouth daily at 6 PM.     hydrochlorothiazide 25 MG tablet  Commonly known as:  HYDRODIURIL  Take 25 mg by mouth daily.     insulin glargine 100 UNIT/ML injection  Commonly known as:  LANTUS  Inject 6-10 Units into the skin 2 (two) times daily. 6 units subq every morning at 7am and 10 units at bedtime     lisinopril 10 MG tablet  Commonly known as:  PRINIVIL,ZESTRIL  Take 10 mg by mouth daily.     vitamin B-12 1000 MCG tablet  Commonly known as:  CYANOCOBALAMIN  Take 1,000 mcg by mouth daily.     Vitamin D-3 1000 UNITS Caps  Take 1,000 Units by mouth daily.        Disposition and follow-up:   Todd [name] was [facility] St[facility].  At the hospital follow up visit please address:  1.  Unresponsive Episode: unclear what caused this. Neurologic cause was not identified. May have been a TIA. Symptoms all resolved. Lipitor dose was increased.  DMII: Patient's A1c was 12.3%; needs better control, though daughter expressed that this has been a struggle in the outpatient setting.  HTN: Permissive HTN was allowed as inpatient; to resume normal anti-hypertensives at discharge.  HLD: Increased statin dose as patient is not at goal.  Please change as necessary.  Dementia: Seroquel (which was started in 07/2014) was discontinued.  2.  Labs / imaging needed at time of follow-up: none  3.  Pending labs/ test needing follow-up: none  Follow-up Appointments:     Follow-up Information    Follow up with Marletta LorBarr, Julie, NP.   Specialty:  Nurse Practitioner   Contact information:   [facility] [address] [place] [address] [phone]       Discharge Instructions: Discharge Instructions    Diet - low sodium heart healthy    Complete by:  As directed      Increase activity slowly    Complete by:  As directed            Consultations:  neurology  Procedures Performed:  Ct Head Wo Contrast  10/03/2014   CLINICAL DATA:  Code stroke  EXAM: CT HEAD WITHOUT CONTRAST  TECHNIQUE: Contiguous axial images were obtained from the base of the skull through the vertex without contrast.  COMPARISON:  08/31/2014  FINDINGS: Stable diffuse brain atrophy and chronic white matter microvascular ischemic changes. No acute intracranial hemorrhage, mass lesion, definite infarction, focal edema or mass effect. No extra-axial fluid collection, midline shift, herniation or hydrocephalus. Normal gray-white matter differentiation maintained. Cisterns are  patent. Cerebellar atrophy as well. Orbits are symmetric. Mastoids are clear. Bilateral ethmoid and left sphenoid sinus opacity appears chronic.  IMPRESSION: Stable brain atrophy and chronic white matter ischemic change. No acute finding or interval change by CT.  These results were called by telephone at the time of interpretation on 10/03/2014 at 9:43 am to Dr. Hosie Poissonsumner , who verbally acknowledged these results.   Electronically Signed   By: Ruel Favorsrevor  Shick M.D.   On: 10/03/2014 09:42   Mr Brain Wo Contrast  10/04/2014   CLINICAL DATA:  80 year old hypertensive diabetic male with history of dementia and chronic kidney disease presenting with unresponsiveness with  left-sided weakness. Initial encounter.  EXAM: MRI HEAD WITHOUT CONTRAST  MRA HEAD WITHOUT CONTRAST  TECHNIQUE: Multiplanar, multiecho pulse sequences of the brain and surrounding structures were obtained without intravenous contrast. Angiographic images of the head were obtained using MRA technique without contrast.  COMPARISON:  10/03/2014 CT.  08/18/2014 MR.  FINDINGS: MRI HEAD FINDINGS  No acute infarct. Minimal increased signal superior aspect of the right lenticular nucleus without significant change and therefore not felt to represent an acute infarct.  No intracranial hemorrhage.  Mild small vessel disease type changes.  No intracranial mass lesion noted on this unenhanced exam.  Global atrophy without hydrocephalus.  Persistent opacification left ethmoid sinus air cells and left sphenoid sinus with restricted motion and therefore acute sinusitis is a possibility.  Minimal exophthalmos.  MRA HEAD FINDINGS  Exam is motion degraded.  Right internal carotid artery cavernous segment mild to moderate irregularity and narrowing  Left internal carotid artery cavernous segment mild narrowing and irregularity.  Carotid terminus mild irregularity and narrowing greater on the left.  High-grade stenosis M1 segment right middle cerebral artery with decrease number of visualized right middle cerebral artery branches. The right middle cerebral artery branch vessel which is visualized is significantly narrowed and irregular.  Mild to moderate narrowing and irregularity involving portions of the M1 segment and branches of the left middle cerebral artery.  Mild to moderate narrowing of portions of the A1 segment and A2 segment of the anterior cerebral artery bilaterally.  Mild to moderate narrowing distal left vertebral artery.  Poor delineation of the left posterior inferior cerebellar artery.  Nonvisualized anterior inferior cerebellar artery early bilaterally.  Mild narrowing of the mid to distal aspect of the basilar  artery.  Mild to moderate narrowing of the superior cerebellar artery greater on the left.  Marked focal stenosis proximal right posterior cerebral artery with moderate narrowing beyond this region.  Moderate narrowing P2 segment left posterior cerebral artery with mild moderate narrowing distal branches.  No aneurysm detected.  IMPRESSION: MRI HEAD  No acute infarct detected.  Mild small vessel disease type changes.  Global atrophy without hydrocephalus.  Persistent opacification left ethmoid sinus air cells and left sphenoid sinus with restricted motion and therefore acute sinusitis is a possibility.  MRA HEAD  Exam is motion degraded.  Significant atherosclerotic type changes. Most notable findings involve the right middle cerebral artery and right posterior cerebral artery including:  High-grade stenosis M1 segment right middle cerebral artery with decrease number of visualized right middle cerebral artery branches. The right middle cerebral artery branch vessel which is visualized is significantly narrowed and irregular.  Marked focal stenosis proximal right posterior cerebral artery.  Please see above for further detail.   Electronically Signed   By: Bridgett LarssonSteve  Olson M.D.   On: 10/04/2014 09:01   Mr Maxine GlennMra Head/brain Wo Cm  10/04/2014  CLINICAL DATA:  80 year old hypertensive diabetic male with history of dementia and chronic kidney disease presenting with unresponsiveness with left-sided weakness. Initial encounter.  EXAM: MRI HEAD WITHOUT CONTRAST  MRA HEAD WITHOUT CONTRAST  TECHNIQUE: Multiplanar, multiecho pulse sequences of the brain and surrounding structures were obtained without intravenous contrast. Angiographic images of the head were obtained using MRA technique without contrast.  COMPARISON:  10/03/2014 CT.  08/18/2014 MR.  FINDINGS: MRI HEAD FINDINGS  No acute infarct. Minimal increased signal superior aspect of the right lenticular nucleus without significant change and therefore not felt to  represent an acute infarct.  No intracranial hemorrhage.  Mild small vessel disease type changes.  No intracranial mass lesion noted on this unenhanced exam.  Global atrophy without hydrocephalus.  Persistent opacification left ethmoid sinus air cells and left sphenoid sinus with restricted motion and therefore acute sinusitis is a possibility.  Minimal exophthalmos.  MRA HEAD FINDINGS  Exam is motion degraded.  Right internal carotid artery cavernous segment mild to moderate irregularity and narrowing  Left internal carotid artery cavernous segment mild narrowing and irregularity.  Carotid terminus mild irregularity and narrowing greater on the left.  High-grade stenosis M1 segment right middle cerebral artery with decrease number of visualized right middle cerebral artery branches. The right middle cerebral artery branch vessel which is visualized is significantly narrowed and irregular.  Mild to moderate narrowing and irregularity involving portions of the M1 segment and branches of the left middle cerebral artery.  Mild to moderate narrowing of portions of the A1 segment and A2 segment of the anterior cerebral artery bilaterally.  Mild to moderate narrowing distal left vertebral artery.  Poor delineation of the left posterior inferior cerebellar artery.  Nonvisualized anterior inferior cerebellar artery early bilaterally.  Mild narrowing of the mid to distal aspect of the basilar artery.  Mild to moderate narrowing of the superior cerebellar artery greater on the left.  Marked focal stenosis proximal right posterior cerebral artery with moderate narrowing beyond this region.  Moderate narrowing P2 segment left posterior cerebral artery with mild moderate narrowing distal branches.  No aneurysm detected.  IMPRESSION: MRI HEAD  No acute infarct detected.  Mild small vessel disease type changes.  Global atrophy without hydrocephalus.  Persistent opacification left ethmoid sinus air cells and left sphenoid sinus  with restricted motion and therefore acute sinusitis is a possibility.  MRA HEAD  Exam is motion degraded.  Significant atherosclerotic type changes. Most notable findings involve the right middle cerebral artery and right posterior cerebral artery including:  High-grade stenosis M1 segment right middle cerebral artery with decrease number of visualized right middle cerebral artery branches. The right middle cerebral artery branch vessel which is visualized is significantly narrowed and irregular.  Marked focal stenosis proximal right posterior cerebral artery.  Please see above for further detail.   Electronically Signed   By: Bridgett Larsson M.D.   On: 10/04/2014 09:01   2D Echo: there was an echo completed during last hospitalization on 08/19/2014.  Admission HPI: [name] is an 80 yo man currently living in an assisted living facility with a history of DMII, hypertension, hyperlipidemia, CAD s/p CABG, CKD Stage III and dementia who is presenting with altered mental status. He is a former smoker. Given his level of dementia, much of his history was collected using notes and details from the ALF.  He was last seen normal on his way to breakfast at his ALF at 8:30am. Then, at 8:45am, staff found him to be  acutely unresponsive. EMS was activated and found him to slowly become responsive with new left-sided weakness. There was no trauma, seizure activity or choking noted at the scene. He did not complain of headache or any other symptoms at the time or at the time of interview in the hospital.  Of note, he had a similar, recent presentation to the hospital for altered mental status on 09/01/14. On that admission, it was determined that he likely had experienced a TIA. Better DMII control, the addition of plavix, and the discontinuation of seroquel were proposed at that time, but were never actually enacted as in- or outpatient. Two months ago, in 07/2014, he was hospitalized for syncope vs  seizures.  Hospital Course by problem list: Active Problems:   Diabetes mellitus, type 2   CKD (chronic kidney disease)   Dementia   HTN (hypertension)   Dyslipidemia   Stroke   CVA (cerebral infarction)   [name] is an 80 yo male with DMII, HTN, HLD, CKD and dementia who presented to the [facility] ED with yet another episode of unresponsiveness on 12/6. This episode was accompanied by left-sided weakness by EMS report. His symptoms resolved by the time he was hospitalized and initial NIHSS was 4. There was no recurrence of any neurologic symptoms or signs in the hospital.   Unresponsive Episode: Per brother, these "blank outs" have been occurring for the past 2-3 years; per daughter, there was a spell of several of these "blank outs" about 3 years ago that then stopped. Patient had EEG in 07/2014 that showed slowing c/w normal drowse that could not r/o slowing related to general cerebral disturbance such as metabolic encephalopathy; no epileptiform activity. Repeat EEG 10/03/2014 pending. Patients with dementia do have a higher incidence of seizures. Last echo 07/2014: LVEF 55-60% with right ventricular systolic pressure c/w mild pulmonary htn. This episode may have been another TIA. MRI showed no acute stroke. MRA showed high grade R M1 stenosis in the setting of diffuse cerebral atherosclerosis. Carotid dopplers (preliminary) showed no significant stenosis. EEG (per EEG tech read, not uploaded into system yet) was normal awake EEG (limited due to artifact). PT recommended return to SNF with no PT needs. Patient lives at ALF. Patient's ASA 325 mg home medication was continued.   DMII: A1c 12.3%<--13.0% last month. On lantus at home. Goal is <7.0. Needs better diabetes control, which has been a struggle in the outpatient setting.   HLD: On this hospitalization, all WNL except for LDL 117 (goal <70). On home Lipitor 10 mg at bedtime. This dose was increased to 80 mg daily.  HTN: Was  normotensive here. At home, on HCTZ 25 mg daily and lisinopril 10 mg daily. Allowed for permissive hypertension in the hospital, but to restart home anti-hypertensives at discharge.  Dementia: Baseline dementia and confusion as per patient's daughter. Patient was prescribed seroquel 50mg  in early October. This was found to be unnecessary at prior hospitalizations and it was recommended that its necessity be re-evaluated given its side effect profile. Formally discontinued home seroquel 50 mg daily during this hospitalization. Of note, daughter states that patient was on Namenda in the past and did not do well with it (changes in behavior).   Discharge Vitals:   BP 142/74 mmHg  Pulse 76  Temp(Src) 98.3 F (36.8 C) (Oral)  Resp 17  Ht 6\' 1"  (1.854 m)  Wt 183 lb (83.008 kg)  BMI 24.15 kg/m2  SpO2 100%  Discharge Labs:  Results for orders placed  or performed during the hospital encounter of 10/03/14 (from the past 24 hour(s))  Glucose, capillary     Status: Abnormal   Collection Time: 10/04/14  5:31 PM  Result Value Ref Range   Glucose-Capillary 156 (H) 70 - 99 mg/dL   Comment 1 Notify RN    Comment 2 Documented in Chart   Glucose, capillary     Status: Abnormal   Collection Time: 10/04/14  8:53 PM  Result Value Ref Range   Glucose-Capillary 205 (H) 70 - 99 mg/dL  Basic metabolic panel     Status: Abnormal   Collection Time: 10/05/14  4:54 AM  Result Value Ref Range   Sodium 136 (L) 137 - 147 mEq/L   Potassium 4.1 3.7 - 5.3 mEq/L   Chloride 97 96 - 112 mEq/L   CO2 23 19 - 32 mEq/L   Glucose, Bld 248 (H) 70 - 99 mg/dL   BUN 27 (H) 6 - 23 mg/dL   Creatinine, Ser 1.61 (H) 0.50 - 1.35 mg/dL   Calcium 09.6 8.4 - 04.5 mg/dL   GFR calc non Af Amer 44 (L) >90 mL/min   GFR calc Af Amer 51 (L) >90 mL/min   Anion gap 16 (H) 5 - 15  Vitamin B12     Status: Abnormal   Collection Time: 10/05/14  4:54 AM  Result Value Ref Range   Vitamin B-12 >2000 (H) 211 - 911 pg/mL  Glucose, capillary      Status: Abnormal   Collection Time: 10/05/14  6:56 AM  Result Value Ref Range   Glucose-Capillary 226 (H) 70 - 99 mg/dL  Glucose, capillary     Status: Abnormal   Collection Time: 10/05/14 11:41 AM  Result Value Ref Range   Glucose-Capillary 214 (H) 70 - 99 mg/dL   Comment 1 Notify RN    Comment 2 Documented in Chart     Signed: Dionne Ano, MD 10/05/2014, 3:51 PM    Services Ordered on Discharge: ALF Equipment Ordered on Discharge: none

## 2014-10-06 LAB — GLUCOSE, CAPILLARY: Glucose-Capillary: 171 mg/dL — ABNORMAL HIGH (ref 70–99)

## 2014-10-06 NOTE — Clinical Social Work Note (Signed)
CSW contacted pt's daughter Neysa BonitoChristy [phone]. CSW introduced self and purpose of call. CSW informed the Neysa BonitoChristy the pt will be discharge today. CSW and [place]risty discussed ambulance transport. CSW contact [facility] at [phone] to schedule transport for the pt. CS[facility]. [facility]. Bedside RN called reported.   Wynston Romey, MSW, LCSWA [phone]

## 2014-10-06 NOTE — Progress Notes (Signed)
Subjective: Patient feels well today; eager to go home. No pain.  Objective: Vital signs in last 24 hours: Filed Vitals:   10/05/14 1802 10/05/14 2125 10/06/14 0141 10/06/14 0603  BP: 143/63 118/80 133/67 111/60  Pulse: 77 71 74 79  Temp: 98 F (36.7 C) 98.2 F (36.8 C) 98.2 F (36.8 C) 98.2 F (36.8 C)  TempSrc: Oral Oral Oral Oral  Resp: 16 18 16 16   Height:      Weight:      SpO2: 99% 100% 99% 100%   Weight change:   Intake/Output Summary (Last 24 hours) at 10/06/14 0954 Last data filed at 10/05/14 1200  Gross per 24 hour  Intake    240 ml  Output      0 ml  Net    240 ml   Physical Exam: Appearance: in NAD, sitting in chair, sitter at bedside HEENT: AT/[place], no lymphadenopathy Heart: RRR, normal S1S2, no MRG, no carotid bruits Lungs: CTAB, no wheezes Abdomen: BS+, soft, nontender Musculoskeletal: normal range of motion Extremities: no edema b/l lower extremities Neurologic: A&Ox1 (does not know type of building we are in when given a list of options, does not know the year) I: smell Not tested  II: visual acuity  OS: na OD: na, patient says he is seeing normally, just missing his glasses  II: visual fields intact  II: pupils Equal, round, reactive to light  III,VII: ptosis None  III,IV,VI: extraocular muscles  Full ROM  V: mastication Normal  V: facial light touch sensation  Normaland symmetric  V,VII: corneal reflex  Not tested  VII: facial muscle function - upper  Normal  VII: facial muscle function - lower Normal  VIII: hearing Normal  IX: soft palate elevation  Normal  IX,X: gag reflex Not tested  XI: trapezius strength  5/5  XI: sternocleidomastoid strength 5/5  XI: neck flexion strength  5/5  XII: tongue strength  [place] Strength UE and LE Coordination  Speech Normal Downgoing b/l 5/5 throughout Slowed, some increased difficulty today, but intact on FNF (with learning  curve) No aphasia  Skin: no rashes or lesions  Lab Results: NO NEW LABS Basic Metabolic Panel:  Recent Labs Lab 10/04/14 0553 10/05/14 0454  NA 137 136*  K 4.0 4.1  CL 99 97  CO2 25 23  GLUCOSE 234* 248*  BUN 25* 27*  CREATININE 1.26 1.46*  CALCIUM 9.3 10.0   Liver Function Tests:  Recent Labs Lab 10/03/14 0920  AST 17  ALT 15  ALKPHOS 55  BILITOT 0.5  PROT 6.9  ALBUMIN 3.4*   CBC:  Recent Labs Lab 10/03/14 0920 10/03/14 0931  WBC 6.5  --   NEUTROABS 2.6  --   HGB 11.8* 12.9*  HCT 34.5* 38.0*  MCV 91.3  --   PLT 180  --    Cardiac Enzymes:  Recent Labs Lab 10/03/14 2320 10/04/14 0553 10/04/14 1150  TROPONINI <0.30 <0.30 <0.30   CBG:  Recent Labs Lab 10/04/14 2053 10/05/14 0656 10/05/14 1141 10/05/14 1648 10/05/14 2144 10/06/14 0629  GLUCAP 205* 226* 214* 283* 181* 171*   Hemoglobin A1C:  Recent Labs Lab 10/03/14 0920  HGBA1C 12.3*   Fasting Lipid Panel:  Recent Labs Lab 10/03/14 2026  CHOL 192  HDL 66  LDLCALC 117*  TRIG 43  CHOLHDL 2.9   Thyroid Function Tests:  Recent Labs Lab 10/03/14 2026  TSH 0.916   Coagulation:  Recent Labs Lab 10/03/14 0920  LABPROT 14.6  INR 1.13  Urine Drug Screen: Drugs of Abuse     Component Value Date/Time   LABOPIA NONE DETECTED 10/03/2014 1847   COCAINSCRNUR NONE DETECTED 10/03/2014 1847   LABBENZ NONE DETECTED 10/03/2014 1847   AMPHETMU NONE DETECTED 10/03/2014 1847   THCU NONE DETECTED 10/03/2014 1847   LABBARB NONE DETECTED 10/03/2014 1847    Alcohol Level:  Recent Labs Lab 10/03/14 0920  ETH <11   Urinalysis:  Recent Labs Lab 10/03/14 1847  COLORURINE YELLOW  LABSPEC 1.023  PHURINE 5.0  GLUCOSEU >1000*  HGBUR NEGATIVE  BILIRUBINUR NEGATIVE  KETONESUR NEGATIVE  PROTEINUR NEGATIVE  UROBILINOGEN 1.0  NITRITE NEGATIVE  LEUKOCYTESUR MODERATE*   Studies/Results: No results found. Medications: I have reviewed the patient's current  medications. Scheduled Meds: . aspirin  325 mg Oral Daily  . atorvastatin  80 mg Oral q1800  . heparin  5,000 Units Subcutaneous 3 times per day  . insulin aspart  0-9 Units Subcutaneous TID WC  . insulin glargine  6 Units Subcutaneous BID   Continuous Infusions:  PRN Meds:. Assessment/Plan: Active Problems:   Diabetes mellitus, type 2   CKD (chronic kidney disease)   Dementia   HTN (hypertension)   Dyslipidemia   Stroke   CVA (cerebral infarction)  [name] is an 80 yo male with DMII, HTN, HLD, CKD and dementia who presented to the ED with yet another episode of unresponsiveness on 12/6. This episode was accompanied by left-sided weakness by EMS report. All of these symptoms resolved by the time he was in the ED and initial NIHSS was 4. No recurrence in the hospital. Patient is stable and was ready for discharge yesterday. [facility] apparently could not accept an afternoon patient. To be discharged today.  Unresponsive Episode: Patient had another episode of observed unresponsiveness, similar to his episode last month, this time accompanied by left-sided weakness, that was initially thought to have been a CVA, TIA or seizure. His symptoms have all resolved. Per brother, these "blank outs" have been occurring for the past 2-3 years; per daughter, there was a spell of several of these "blank outs" about 3 years ago that then stopped. Patient had EEG in 07/2014 that showed slowing c/w normal drowse that could not r/o slowing related to general cerebral disturbance such as metabolic encephalopathy; no epileptiform activity. However, patients with dementia have a higher incidence of seizures. Last echo 07/2014: LVEF 55-60% with right ventricular systolic pressure c/w mild pulmonary htn. This episode may have been another TIA. MRI showed no acute stroke. MRA showed high grade R M1 stenosis in the setting of diffuse cerebral atherosclerosis. Carotid dopplers (preliminary) showed no significant  stenosis. Echo showed no source of embolus. - EEG per tech (result still not uploaded) shows limited study (due to artifact), normal awake EEG - Tele has been d/c as patient removed it himself; no events - PT: no PT needs, recommend return to SNF (patient is at ALF) - Speech recommends regular, thin liquid diet - OT: no follow up  - Continue ASA 325 mg (home medication)  - Frequent neuro checks - Risk factor modification  DMII: A1c 12.3%<--13.0% last month. On lantus. Goal is <7.0. - Continue home lantus  - Appreciate diabetes management recommendations, changed to sensitive and split lantus (6U am and 6U pm)  HLD: No lipid panel in our records. On this hospitalization, all WNL except for LDL 117 (goal <70). On home Lipitor 10 mg at bedtime. - Continue home lipitor at increased dose 80 mg daily  HTN: Currently  116/56. At home, on HCTZ 25 mg daily and lisinopril 10 mg daily. - To resume anti-hypertensives at ALF today  CAD s/p CABG: Stable. Has had 2 poor EKGs with background noise. - Neurology note mentions plavix; will start as outpatient   CKD Stage IIIA: Stable. Cr 1.60-->1.26-->1.46 and GFR 49. Baseline Cr ~1.50.   Dementia: Baseline dementia and confusion as per patient's daughter. He has never seen a neurologist as an outpatient, though neuropsychiatry evaluation has been recommended at prior hospitalizations. Patient has been prescribed seroquel 50mg  in early October. This was found to be unnecessary at prior hospitalizations and it was recommended that its necessity be re-evaluated given its side effect profile. - D/c home seroquel 50 mg daily - Continue home vitamin B12  Diet: - HH/carb mod  DVT Ppx: - sq heparin - SCDs  Code Status: DNR/DNI  Dispo: Disposition is deferred at this time, awaiting improvement of current medical problems.  Anticipated discharge in approximately 0 day(s). Patient's daughter is considering SNF; not ready to make the switch next, but perhaps  if he continues to get admitted.  The patient does have a current PCP Marletta Lor, NP) and does not need an [facility] hospital follow-up appointment after discharge.  The patient does have transportation limitations that hinder transportation to clinic appointments.  .Services Needed at time of discharge: Y = Yes, Blank = No PT:   OT:   RN:   Equipment:   Other:     LOS: 3 days   Dionne Ano, MD 10/06/2014, 9:54 AM

## 2014-10-06 NOTE — Plan of Care (Signed)
Problem: Acute Treatment Outcomes Goal: tPA Patient w/o S&S of bleeding Outcome: Not Applicable Date Met:  21/97/58

## 2014-10-06 NOTE — Plan of Care (Signed)
Problem: Acute Treatment Outcomes Goal: Neuro exam at baseline or improved Outcome: Completed/Met Date Met:  10/06/14

## 2014-10-06 NOTE — Plan of Care (Signed)
Problem: Progression Outcomes Goal: Educational plan initiated Outcome: Completed/Met Date Met:  10/06/14

## 2014-10-06 NOTE — Plan of Care (Signed)
Problem: Progression Outcomes Goal: Rehab Team goals identified Outcome: Completed/Met Date Met:  10/06/14     

## 2014-10-06 NOTE — Plan of Care (Signed)
Problem: Acute Treatment Outcomes Goal: Other Acute Treatment Outcomes Outcome: Completed/Met Date Met:  10/06/14

## 2014-10-06 NOTE — Plan of Care (Signed)
Problem: Progression Outcomes Goal: Other Progression Outcomes Outcome: Completed/Met Date Met:  10/06/14

## 2014-10-06 NOTE — Progress Notes (Signed)
[facility]. Report called to Linsey.

## 2014-10-06 NOTE — Plan of Care (Signed)
Problem: Acute Treatment Outcomes Goal: Prognosis discussed with family/patient as appropriate Outcome: Completed/Met Date Met:  10/06/14

## 2014-10-16 ENCOUNTER — Emergency Department (HOSPITAL_COMMUNITY): Payer: Medicare PPO

## 2014-10-16 ENCOUNTER — Encounter (HOSPITAL_COMMUNITY): Payer: Self-pay | Admitting: Emergency Medicine

## 2014-10-16 ENCOUNTER — Observation Stay (HOSPITAL_COMMUNITY)
Admission: EM | Admit: 2014-10-16 | Discharge: 2014-10-18 | Disposition: A | Discharge status: Skilled Nursing Facility | Payer: Medicare PPO | Attending: Internal Medicine | Admitting: Internal Medicine

## 2014-10-16 DIAGNOSIS — Z794 Long term (current) use of insulin: Secondary | ICD-10-CM | POA: Diagnosis not present

## 2014-10-16 DIAGNOSIS — R4182 Altered mental status, unspecified: Secondary | ICD-10-CM | POA: Diagnosis present

## 2014-10-16 DIAGNOSIS — E119 Type 2 diabetes mellitus without complications: Secondary | ICD-10-CM | POA: Diagnosis not present

## 2014-10-16 DIAGNOSIS — R4189 Other symptoms and signs involving cognitive functions and awareness: Secondary | ICD-10-CM

## 2014-10-16 DIAGNOSIS — E1165 Type 2 diabetes mellitus with hyperglycemia: Secondary | ICD-10-CM

## 2014-10-16 DIAGNOSIS — Z79899 Other long term (current) drug therapy: Secondary | ICD-10-CM | POA: Diagnosis not present

## 2014-10-16 DIAGNOSIS — I129 Hypertensive chronic kidney disease with stage 1 through stage 4 chronic kidney disease, or unspecified chronic kidney disease: Secondary | ICD-10-CM | POA: Insufficient documentation

## 2014-10-16 DIAGNOSIS — Z7982 Long term (current) use of aspirin: Secondary | ICD-10-CM | POA: Diagnosis not present

## 2014-10-16 DIAGNOSIS — E118 Type 2 diabetes mellitus with unspecified complications: Secondary | ICD-10-CM

## 2014-10-16 DIAGNOSIS — R55 Syncope and collapse: Secondary | ICD-10-CM | POA: Diagnosis present

## 2014-10-16 DIAGNOSIS — F0390 Unspecified dementia without behavioral disturbance: Secondary | ICD-10-CM | POA: Diagnosis not present

## 2014-10-16 DIAGNOSIS — N183 Chronic kidney disease, stage 3 (moderate): Secondary | ICD-10-CM | POA: Insufficient documentation

## 2014-10-16 DIAGNOSIS — F0150 Vascular dementia without behavioral disturbance: Secondary | ICD-10-CM | POA: Diagnosis present

## 2014-10-16 DIAGNOSIS — Z87891 Personal history of nicotine dependence: Secondary | ICD-10-CM | POA: Insufficient documentation

## 2014-10-16 DIAGNOSIS — I10 Essential (primary) hypertension: Secondary | ICD-10-CM | POA: Diagnosis present

## 2014-10-16 DIAGNOSIS — IMO0002 Reserved for concepts with insufficient information to code with codable children: Secondary | ICD-10-CM

## 2014-10-16 LAB — COMPREHENSIVE METABOLIC PANEL
ALBUMIN: 3.3 g/dL — AB (ref 3.5–5.2)
ALT: 13 U/L (ref 0–53)
AST: 15 U/L (ref 0–37)
Alkaline Phosphatase: 67 U/L (ref 39–117)
Anion gap: 13 (ref 5–15)
BUN: 23 mg/dL (ref 6–23)
CALCIUM: 9.2 mg/dL (ref 8.4–10.5)
CHLORIDE: 102 meq/L (ref 96–112)
CO2: 27 meq/L (ref 19–32)
Creatinine, Ser: 1.27 mg/dL (ref 0.50–1.35)
GFR calc Af Amer: 60 mL/min — ABNORMAL LOW (ref >90)
GFR, EST NON AFRICAN AMERICAN: 52 mL/min — AB (ref >90)
Glucose, Bld: 231 mg/dL — ABNORMAL HIGH (ref 70–99)
POTASSIUM: 3.8 meq/L (ref 3.7–5.3)
Sodium: 142 mEq/L (ref 137–147)
Total Bilirubin: 0.4 mg/dL (ref 0.3–1.2)
Total Protein: 6.8 g/dL (ref 6.0–8.3)

## 2014-10-16 LAB — CBC WITH DIFFERENTIAL/PLATELET
Basophils Absolute: 0 10*3/uL (ref 0.0–0.1)
Basophils Relative: 0 % (ref 0–1)
EOS ABS: 0.3 10*3/uL (ref 0.0–0.7)
Eosinophils Relative: 3 % (ref 0–5)
HCT: 25.8 % — ABNORMAL LOW (ref 39.0–52.0)
HEMOGLOBIN: 8.6 g/dL — AB (ref 13.0–17.0)
LYMPHS ABS: 3.1 10*3/uL (ref 0.7–4.0)
LYMPHS PCT: 30 % (ref 12–46)
MCH: 30.3 pg (ref 26.0–34.0)
MCHC: 33.3 g/dL (ref 30.0–36.0)
MCV: 90.8 fL (ref 78.0–100.0)
MONOS PCT: 6 % (ref 3–12)
Monocytes Absolute: 0.6 10*3/uL (ref 0.1–1.0)
NEUTROS ABS: 6.1 10*3/uL (ref 1.7–7.7)
Neutrophils Relative %: 61 % (ref 43–77)
Platelets: 187 10*3/uL (ref 150–400)
RBC: 2.84 MIL/uL — ABNORMAL LOW (ref 4.22–5.81)
RDW: 12.5 % (ref 11.5–15.5)
WBC: 10.1 10*3/uL (ref 4.0–10.5)

## 2014-10-16 LAB — RETICULOCYTES
RBC.: 3.52 MIL/uL — AB (ref 4.22–5.81)
RETIC COUNT ABSOLUTE: 35.2 10*3/uL (ref 19.0–186.0)
Retic Ct Pct: 1 % (ref 0.4–3.1)

## 2014-10-16 LAB — TROPONIN I: Troponin I: <0.3 ng/mL (ref <0.30)

## 2014-10-16 LAB — URINE MICROSCOPIC-ADD ON

## 2014-10-16 LAB — URINALYSIS, ROUTINE W REFLEX MICROSCOPIC
Bilirubin Urine: NEGATIVE
Glucose, UA: >1000 mg/dL — AB
Hgb urine dipstick: NEGATIVE
KETONES UR: NEGATIVE mg/dL
Leukocytes, UA: NEGATIVE
NITRITE: NEGATIVE
Protein, ur: NEGATIVE mg/dL
SPECIFIC GRAVITY, URINE: 1.021 (ref 1.005–1.030)
Urobilinogen, UA: 1 mg/dL (ref 0.0–1.0)
pH: 5 (ref 5.0–8.0)

## 2014-10-16 LAB — GLUCOSE, CAPILLARY
Glucose-Capillary: 254 mg/dL — ABNORMAL HIGH (ref 70–99)
Glucose-Capillary: 312 mg/dL — ABNORMAL HIGH (ref 70–99)

## 2014-10-16 LAB — MRSA PCR SCREENING: MRSA BY PCR: NEGATIVE

## 2014-10-16 LAB — PROTIME-INR
INR: 1.12 (ref 0.00–1.49)
Prothrombin Time: 14.6 seconds (ref 11.6–15.2)

## 2014-10-16 LAB — I-STAT CG4 LACTIC ACID, ED: Lactic Acid, Venous: 2.23 mmol/L — ABNORMAL HIGH (ref 0.5–2.2)

## 2014-10-16 LAB — LACTIC ACID, PLASMA: Lactic Acid, Venous: 1.1 mmol/L (ref 0.5–2.2)

## 2014-10-16 LAB — CBG MONITORING, ED: GLUCOSE-CAPILLARY: 202 mg/dL — AB (ref 70–99)

## 2014-10-16 LAB — POC OCCULT BLOOD, ED: FECAL OCCULT BLD: NEGATIVE

## 2014-10-16 MED ORDER — VITAMIN B-12 1000 MCG PO TABS
1000.0000 ug | ORAL_TABLET | Freq: Every day | ORAL | Status: DC
  Administered 2014-10-17 – 2014-10-18 (×2): 1000 ug via ORAL
  Filled 2014-10-16 (×2): qty 1

## 2014-10-16 MED ORDER — SODIUM CHLORIDE 0.9 % IV SOLN
1000.0000 mL | INTRAVENOUS | Status: DC
  Administered 2014-10-16 – 2014-10-17 (×3): 1000 mL via INTRAVENOUS

## 2014-10-16 MED ORDER — INSULIN GLARGINE 100 UNIT/ML ~~LOC~~ SOLN
10.0000 [IU] | Freq: Every day | SUBCUTANEOUS | Status: DC
  Administered 2014-10-16 – 2014-10-17 (×2): 10 [IU] via SUBCUTANEOUS
  Filled 2014-10-16 (×3): qty 0.1

## 2014-10-16 MED ORDER — HYDROCHLOROTHIAZIDE 25 MG PO TABS
25.0000 mg | ORAL_TABLET | Freq: Every day | ORAL | Status: DC
  Administered 2014-10-17 – 2014-10-18 (×2): 25 mg via ORAL
  Filled 2014-10-16 (×2): qty 1

## 2014-10-16 MED ORDER — ASPIRIN EC 325 MG PO TBEC
325.0000 mg | DELAYED_RELEASE_TABLET | Freq: Every day | ORAL | Status: DC
Start: 2014-10-17 — End: 2014-10-19
  Administered 2014-10-17 – 2014-10-18 (×2): 325 mg via ORAL
  Filled 2014-10-16 (×2): qty 1

## 2014-10-16 MED ORDER — SODIUM CHLORIDE 0.9 % IV SOLN
1000.0000 mL | Freq: Once | INTRAVENOUS | Status: AC
  Administered 2014-10-16: 1000 mL via INTRAVENOUS

## 2014-10-16 MED ORDER — ATORVASTATIN CALCIUM 20 MG PO TABS
20.0000 mg | ORAL_TABLET | Freq: Every day | ORAL | Status: DC
  Administered 2014-10-16: 20 mg via ORAL
  Filled 2014-10-16 (×2): qty 1

## 2014-10-16 MED ORDER — INSULIN GLARGINE 100 UNIT/ML ~~LOC~~ SOLN
8.0000 [IU] | Freq: Every day | SUBCUTANEOUS | Status: DC
  Administered 2014-10-17 – 2014-10-18 (×2): 8 [IU] via SUBCUTANEOUS
  Filled 2014-10-16 (×3): qty 0.08

## 2014-10-16 MED ORDER — VITAMIN D-3 25 MCG (1000 UT) PO CAPS
1000.0000 [IU] | ORAL_CAPSULE | Freq: Every day | ORAL | Status: DC
  Filled 2014-10-16: qty 1

## 2014-10-16 MED ORDER — SODIUM CHLORIDE 0.9 % IJ SOLN
3.0000 mL | Freq: Two times a day (BID) | INTRAMUSCULAR | Status: DC
  Administered 2014-10-16 – 2014-10-18 (×4): 3 mL via INTRAVENOUS

## 2014-10-16 MED ORDER — ACETAMINOPHEN 325 MG PO TABS: 650.0000 mg | ORAL_TABLET | Freq: Four times a day (QID) | ORAL | Status: DC | PRN

## 2014-10-16 MED ORDER — INSULIN ASPART 100 UNIT/ML ~~LOC~~ SOLN
0.0000 [IU] | Freq: Three times a day (TID) | SUBCUTANEOUS | Status: DC
  Administered 2014-10-16: 5 [IU] via SUBCUTANEOUS
  Administered 2014-10-17: 3 [IU] via SUBCUTANEOUS
  Administered 2014-10-17: 2 [IU] via SUBCUTANEOUS
  Administered 2014-10-18 (×2): 3 [IU] via SUBCUTANEOUS

## 2014-10-16 MED ORDER — HEPARIN SODIUM (PORCINE) 5000 UNIT/ML IJ SOLN
5000.0000 [IU] | Freq: Three times a day (TID) | INTRAMUSCULAR | Status: DC
  Administered 2014-10-16 – 2014-10-18 (×6): 5000 [IU] via SUBCUTANEOUS
  Filled 2014-10-16 (×8): qty 1

## 2014-10-16 MED ORDER — ACETAMINOPHEN 650 MG RE SUPP: 650.0000 mg | Freq: Four times a day (QID) | RECTAL | Status: DC | PRN

## 2014-10-16 NOTE — ED Notes (Signed)
Lactic acid results given to Dr. Lockwood 

## 2014-10-16 NOTE — H&P (Signed)
Date: 10/16/2014               Patient Name:  Todd [name] MRN: [id number]  DOB: May 29, 1932 Age / Sex: 80 y.o., male   PCP: Marletta Lor, NP         Medical Service: Internal Medicine Teaching Service         Attending Physician: Dr. Debe Coder, MD    First Contact: Dr. Leatha Gilding Pager: [phone]  Second Contact: Dr. Delane Ginger Pager: [phone]       After Hours (After 5p/  First Contact Pager: [phone]  weekends / holidays): Second Contact Pager: [phone]   Chief Complaint: Syncope  History of Present Illness: Todd [name] is an 80 year old man resident of [facility] nursing home with a history of DMII, hypertension, hyperlipidemia, CAD s/p CABG in 1994, CKD Stage III and dementia who is presenting with altered mental status. He became unresponsive at breakfast this morning. He was found slumped over at the breakfast table. Upon arrival of EMS, he was minimally responsive but improved en route. Limited history due to patient's dementia. Denies fevers, chills, vision changes, trouble swallowing, SOB, CP, N/V, LH/dizziness. Reports dysuria and diarrhea.  Recently hospitalized for altered mental status 10/03/2014 to 10/05/2014 with new left sided weakness. On that admission, it was determined that he likely had experienced a TIA. Better DMII control, the addition of plavix, and the discontinuation of seroquel were proposed at that time.  Meds: Current Facility-Administered Medications  Medication Dose Route Frequency Provider Last Rate Last Dose  . 0.9 %  sodium chloride infusion  1,000 mL Intravenous Continuous Gerhard Munch, MD 125 mL/hr at 10/16/14 1146 1,000 mL at 10/16/14 1146   Current Outpatient Prescriptions  Medication Sig Dispense Refill  . aspirin EC 325 MG tablet Take 325 mg by mouth daily.    [name] atorvastatin (LIPITOR) 20 MG tablet Take 20 mg by mouth daily.    . Cholecalciferol (VITAMIN D-3) 1000 UNITS CAPS Take 1,000 Units by mouth daily.    . hydrochlorothiazide (HYDRODIURIL)  25 MG tablet Take 25 mg by mouth daily.    . insulin glargine (LANTUS) 100 UNIT/ML injection Inject 8-10 Units into the skin 2 (two) times daily. 8 units subq every morning at 7am and 10 units at bedtime    . lisinopril (PRINIVIL,ZESTRIL) 10 MG tablet Take 10 mg by mouth daily.    . QUEtiapine (SEROQUEL) 50 MG tablet Take 50 mg by mouth 2 (two) times daily.    . vitamin B-12 (CYANOCOBALAMIN) 1000 MCG tablet Take 1,000 mcg by mouth daily.    [name] atorvastatin (LIPITOR) 80 MG tablet Take 1 tablet (80 mg total) by mouth daily at 6 PM. (Patient not taking: Reported on 10/16/2014)      Allergies: Allergies as of 10/16/2014  . (No Known Allergies)   Past Medical History  Diagnosis Date  . Elevated cholesterol   . Hypertension   . Type II diabetes mellitus   . Dementia     "don't know stage or type" (08/18/2014)  . Arthritis     "probably"  . Chronic kidney disease     "? stage" (08/18/2014)   Past Surgical History  Procedure Laterality Date  . Tonsillectomy    . Coronary artery bypass graft  ?1994    "CABG X3"  . Cataract extraction     No family history on file. History   Social History  . Marital Status: Widowed    Spouse Name: N/A    Number of Children: N/A  .  Years of Education: N/A   Occupational History  . Not on file.   Social History Main Topics  . Smoking status: Former Smoker    Types: Pipe  . Smokeless tobacco: Never Used     Comment: "quit smoking a pipe in the 80's"  . Alcohol Use: No  . Drug Use: No  . Sexual Activity: Not on file   Other Topics Concern  . Not on file   Social History Narrative    Review of Systems: Constitutional: no fevers/chills Eyes: no vision changes Ears, nose, mouth, throat, and face: +chronic cough Respiratory: no shortness of breath Cardiovascular: no chest pain Gastrointestinal: no nausea/vomiting, no abdominal pain, no constipation, +diarrhea Genitourinary: +dysuria, no hematuria Integument: no  rash Hematologic/lymphatic: no bleeding/bruising, no edema Musculoskeletal: no arthralgias, no myalgias Neurological: no paresthesias, no weakness  Physical Exam: Blood pressure 157/70, pulse 77, temperature 97.2 F (36.2 C), temperature source Rectal, resp. rate 16, SpO2 100 %. General Apperance: NAD Head: Normocephalic, atraumatic Eyes: PERRL, EOMI, anicteric sclera Ears: Normal external ear canal Nose: Nares normal, septum midline, mucosa normal Throat: Lips, mucosa and tongue normal  Neck: Supple, trachea midline Back: No tenderness or bony abnormality  Lungs: Clear to auscultation bilaterally. No wheezes, rhonchi or rales. Breathing comfortably on room air Chest Wall: Nontender, no deformity Heart: Regular rate and rhythm, +systolic murmur, no rub/gallop Abdomen: Soft, nontender, nondistended, no rebound/guarding Extremities: Normal, atraumatic, warm and well perfused, no edema Pulses: 2+ throughout Skin: No rashes or lesions Neurologic: Alert and oriented x 1. Speech fluent without aphasia. Able to follow commands but with mild difficulty. CNII-XII intact. Normal strength and sensation bilaterally. Normal finger-to-nose but patient had difficulty understanding instructions.   Lab results: Basic Metabolic Panel:  Recent Labs  16/07/9611/19/15 1030  NA 142  K 3.8  CL 102  CO2 27  GLUCOSE 231*  BUN 23  CREATININE 1.27  CALCIUM 9.2   Liver Function Tests:  Recent Labs  10/16/14 1030  AST 15  ALT 13  ALKPHOS 67  BILITOT 0.4  PROT 6.8  ALBUMIN 3.3*   CBC:  Recent Labs  10/16/14 1030  WBC 10.1  NEUTROABS 6.1  HGB 8.6*  HCT 25.8*  MCV 90.8  PLT 187   Cardiac Enzymes:  Recent Labs  10/16/14 1449  TROPONINI <0.30    CBG:  Recent Labs  10/16/14 1009  GLUCAP 202*   Hemoglobin A1C: No results for input(s): HGBA1C in the last 72 hours.  Fasting Lipid Panel: No results for input(s): CHOL, HDL, LDLCALC, TRIG, CHOLHDL, LDLDIRECT in the last 72  hours.  Thyroid Function Tests: No results for input(s): TSH, T4TOTAL, FREET4, T3FREE, THYROIDAB in the last 72 hours.  Anemia Panel: No results for input(s): VITAMINB12, FOLATE, FERRITIN, TIBC, IRON, RETICCTPCT in the last 72 hours.  Coagulation:  Recent Labs  10/16/14 1030  LABPROT 14.6  INR 1.12   Urine Drug Screen: Drugs of Abuse     Component Value Date/Time   LABOPIA NONE DETECTED 10/03/2014 1847   COCAINSCRNUR NONE DETECTED 10/03/2014 1847   LABBENZ NONE DETECTED 10/03/2014 1847   AMPHETMU NONE DETECTED 10/03/2014 1847   THCU NONE DETECTED 10/03/2014 1847   LABBARB NONE DETECTED 10/03/2014 1847    Urinalysis:  Recent Labs  10/16/14 1323  COLORURINE YELLOW  LABSPEC 1.021  PHURINE 5.0  GLUCOSEU >1000*  HGBUR NEGATIVE  BILIRUBINUR NEGATIVE  KETONESUR NEGATIVE  PROTEINUR NEGATIVE  UROBILINOGEN 1.0  NITRITE NEGATIVE  LEUKOCYTESUR NEGATIVE   Misc. Labs: Lactic acid 10/16/2014  2.23  Imaging results:  Ct Head Wo Contrast  10/16/2014   CLINICAL DATA:  Altered mental status.  Unresponsive.  EXAM: CT HEAD WITHOUT CONTRAST  TECHNIQUE: Contiguous axial images were obtained from the base of the skull through the vertex without intravenous contrast.  COMPARISON:  Brain CT 10/03/2014  FINDINGS: Ventricles and sulci are prominent, compatible with atrophy. Periventricular and subcortical white matter hypodensities compatible with chronic small vessel ischemic change. Basal ganglia calcifications. The orbits are unremarkable. Ethmoid and sphenoid sinus opacification, unchanged from prior. Mastoid air cells are well aerated. The calvarium is intact.  IMPRESSION: No acute intracranial process.   Electronically Signed   By: Annia Beltrew  Davis M.D.   On: 10/16/2014 11:26   Dg Chest Port 1 View  10/16/2014   CLINICAL DATA:  Altered mental status, history of diabetes, hypertension, dementia  EXAM: PORTABLE CHEST - 1 VIEW  COMPARISON:  08/18/2014  FINDINGS: There is no focal parenchymal  opacity, pleural effusion, or pneumothorax. Stable cardiomediastinal silhouette. There is evidence of prior CABG.  The osseous structures are unremarkable.  IMPRESSION: No active disease.   Electronically Signed   By: Elige KoHetal  Patel   On: 10/16/2014 11:02    Other results: EKG: Normal sinus rhythm, unchanged from prior EKG  Assessment & Plan by Problem: Active Problems:   Syncope  Syncope: Patient had another episode of unresponsiveness, similar to his episode earlier this month. Patient had EEG in 07/2014 that showed slowing c/w normal drowse that could not r/o slowing related to general cerebral disturbance such as metabolic encephalopathy; no epileptiform activity. Last echo 08/19/2014: LV EF 55-60%, grade 1 diastolic dysfunction, right ventricular systolic pressure c/w mild pulmonary htn. Unable to evaluate if 2/2 vasovagal due to patients dementia and limited history. Negative for orthostatic hypotension (BP supine 158/67, standing 186/79). EKG with normal sinus rhythm, no arrhythmias. Initial troponin negative. CXR with no acute abnormality. CT head with no acute intracranial process. Wells score 0 and Geneva Score 1 - low risk for PE.  - PT, OT - telemetry monitoring - hold home seroquel 50mg  BID  Lactic acidosis: Lactic acid 2.23. Differential includes hypoperfusion (hypovolemia, sepsis). Bicarb wnl with anion gap of 13. No ketones in UA. -NS @ 16625ml/hr -Recheck lactic acid  Acute on Chronic Anemia: Hgb 8.6 on admission with normal MCV 90.8. Baseline 12. FOBT negative. Denies gross blood loss.  -Anemia Panel: Iron, TIBC, Ferritin, Folate, Vitamin B12, reticulocytes -Continue to monitor.  DMII: Last A1c 10/03/2014 12.3%. On lantus 8units qAM and 10units QHS. - Continue home lantus 10 units QHS, 8 units QAM - SSI sensitive, CBG qAC/HS  HLD: Lipid panel 10/03/2014 with LDL 117 otherwise, wnl. On home Lipitor 20mg  at bedtime. - Continue home Lipitor 20mg  QHS  HTN: Currently 157/70. On  home HCTZ 25 mg daily and lisinopril 10 mg daily. - Hold home HCTZ and lisinopril  CAD s/p CABG: Stable. - Continue home ASA 325 mg daily.  CKD Stage III: Cr 1.27. Baseline Cr ~1.50. - Continue to monitor  Dementia: Baseline dementia and confusion. He has never seen a neurologist as an outpatient, though neuropsychiatry evaluation has been recommended at prior hospitalizations. Patient has been prescribed seroquel 50mg  in early October. This was found to be unnecessary at prior hospitalizations and it was recommended that its necessity be re-evaluated given its side effect profile. - Hold home seroquel 50 mg BID - Continue home vitamin B12  FEN: - Heart healthy/carb modified  VTE PPx: subQ heparin TID  Dispo: Disposition is  deferred at this time, awaiting improvement of current medical problems. Anticipated discharge in approximately 1-2 day(s).   The patient does have a current PCP Marletta Lor, NP) and does not need an [facility] hospital follow-up appointment after discharge.  The patient does not have transportation limitations that hinder transportation to clinic appointments.  Signed: Griffin Basil, MD 10/16/2014, 12:38 PM

## 2014-10-16 NOTE — ED Notes (Signed)
[facility] with c/o pt was at breakfast and became unresponsive.  Upon arrival of EMS, pt was sitting in chair being held up by fire department maintaining airway opened. Pt had agonal breathing at that time. EMS placed nasal trumpet in right nare and did BVM with 15 L O2. Pt became alert to verbal stimulation. Upon arrival here, pt opening his eyes on his own and talking to Dr. Jeraldine LootsLockwood.

## 2014-10-16 NOTE — Progress Notes (Signed)
Clinical Social Work Department BRIEF PSYCHOSOCIAL ASSESSMENT 10/16/2014  Patient:  Todd [name], Todd [name]     Account Number:  [id number]     Admit date:  10/16/2014  Clinical Social Worker:  Thalia Party  Date/Time:  10/16/2014 02:49 PM  Referred by:  CSW  Date Referred:  10/16/2014 Referred for  SNF Placement   Other Referral:   Interview type:  Patient Other interview type:    PSYCHOSOCIAL DATA Living Status:  FACILITY Admitted from facility:  Other Level of care:  Assisted Living Primary support name:  Cardell Peach Primary support relationship to patient:  CHILD, ADULT Degree of support available:   Patient states his daughter and brother(Carnell) live in [place] and are good supports.    CURRENT CONCERNS Current Concerns  Other - See comment   Other Concerns:   Concern that patient may need a SNF rather than ALF.    SOCIAL WORK ASSESSMENT / PLAN CSW met with this 80 y/o, African-American, male that was admitted from [place] after he was found unresponsive and brought to the ED.  Patient states he does not remember why he was brought.  Patient has a blunted affect, mood is "good."  Patient has no complaints, denies any type of ideation or MH symptoms.  Patient has a history of stroke, altered mental status.  CSW responded due to a Social Work consult suggesting the patient may need a higher level of care such as a SNF.  Patient will be admitted and will be further evaluated.  CSW on his floor will continue to work on the proper placement.   Assessment/plan status:   Other assessment/ plan:   Information/referral to community resources:    PATIENT'S/FAMILY'S RESPONSE TO PLAN OF CARE: Patient states he cannot remember what led him to the ED, he is okay with returning to the facility, though he cannot remember what the facility is called.    [facility] Mistina Coatney Richardo Priest ED CSW [phone]

## 2014-10-16 NOTE — ED Provider Notes (Signed)
CSN: [id number]     Arrival date & time 10/16/14  1001 History   First MD Initiated Contact with Patient 10/16/14 1005     Chief Complaint  Patient presents with  . Altered Mental Status     HPI  Patient presents after being found unresponsive at his nursing facility. Per report the patient was slumped over at the breakfast table. EMS reports that on their arrival the patient was minimally responsive, required bag valve mask ventilation. En route the patient improved, both in terms of interactivity, and respiratory wise. The patient has dementia, which limits his ability to provide details, but on exam the patient is awake, denies pain, denies nausea. Level V caveat.   Past Medical History  Diagnosis Date  . Elevated cholesterol   . Hypertension   . Type II diabetes mellitus   . Dementia     "don't know stage or type" (08/18/2014)  . Arthritis     "probably"  . Chronic kidney disease     "? stage" (08/18/2014)   Past Surgical History  Procedure Laterality Date  . Tonsillectomy    . Coronary artery bypass graft  ?1994    "CABG X3"  . Cataract extraction     No family history on file. History  Substance Use Topics  . Smoking status: Former Smoker    Types: Pipe  . Smokeless tobacco: Never Used     Comment: "quit smoking a pipe in the 80's"  . Alcohol Use: No    Review of Systems  Unable to perform ROS: Dementia      Allergies  Review of patient's allergies indicates no known allergies.  Home Medications   Prior to Admission medications   Medication Sig Start Date End Date Taking? Authorizing Provider  aspirin EC 325 MG tablet Take 325 mg by mouth daily.   Yes Historical Provider, MD  atorvastatin (LIPITOR) 20 MG tablet Take 20 mg by mouth daily.   Yes Historical Provider, MD  Cholecalciferol (VITAMIN D-3) 1000 UNITS CAPS Take 1,000 Units by mouth daily.   Yes Historical Provider, MD  hydrochlorothiazide (HYDRODIURIL) 25 MG tablet Take 25 mg by mouth  daily.   Yes Historical Provider, MD  insulin glargine (LANTUS) 100 UNIT/ML injection Inject 8-10 Units into the skin 2 (two) times daily. 8 units subq every morning at 7am and 10 units at bedtime   Yes Historical Provider, MD  lisinopril (PRINIVIL,ZESTRIL) 10 MG tablet Take 10 mg by mouth daily.   Yes Historical Provider, MD  QUEtiapine (SEROQUEL) 50 MG tablet Take 50 mg by mouth 2 (two) times daily.   Yes Historical Provider, MD  vitamin B-12 (CYANOCOBALAMIN) 1000 MCG tablet Take 1,000 mcg by mouth daily.   Yes Historical Provider, MD  atorvastatin (LIPITOR) 80 MG tablet Take 1 tablet (80 mg total) by mouth daily at 6 PM. Patient not taking: Reported on 10/16/2014 10/05/14   Dionne AnoJulia Mallory, MD   BP 157/70 mmHg  Pulse 77  Temp(Src) 97.2 F (36.2 C) (Rectal)  Resp 16  SpO2 100% Physical Exam  Constitutional: He appears well-developed. No distress.  HENT:  Head: Normocephalic and atraumatic.  Eyes: Conjunctivae and EOM are normal.  Cardiovascular: Normal rate and regular rhythm.   Pulmonary/Chest: Effort normal. No stridor. No respiratory distress.  Abdominal: He exhibits no distension.  Genitourinary:  Stool is brown, Hemoccult negative  Musculoskeletal: He exhibits no edema.  Neurological: He is alert.  No facial asymmetry, mild tremor, no gross asymmetry of strength in upper or  lower extremities. Speech is clear, brief.  Patient is oriented 1, with gross orientation to location as well.  Skin: Skin is warm and dry.  Psychiatric: His speech is delayed. He is withdrawn. Cognition and memory are impaired.  Nursing note and vitals reviewed.   ED Course  Procedures (including critical care time) Labs Review Labs Reviewed  CBC WITH DIFFERENTIAL - Abnormal; Notable for the following:    RBC 2.84 (*)    Hemoglobin 8.6 (*)    HCT 25.8 (*)    All other components within normal limits  COMPREHENSIVE METABOLIC PANEL - Abnormal; Notable for the following:    Glucose, Bld 231 (*)     Albumin 3.3 (*)    GFR calc non Af Amer 52 (*)    GFR calc Af Amer 60 (*)    All other components within normal limits  CBG MONITORING, ED - Abnormal; Notable for the following:    Glucose-Capillary 202 (*)    All other components within normal limits  I-STAT CG4 LACTIC ACID, ED - Abnormal; Notable for the following:    Lactic Acid, Venous 2.23 (*)    All other components within normal limits  PROTIME-INR  URINALYSIS, ROUTINE W REFLEX MICROSCOPIC  POC OCCULT BLOOD, ED    Imaging Review Ct Head Wo Contrast  10/16/2014   CLINICAL DATA:  Altered mental status.  Unresponsive.  EXAM: CT HEAD WITHOUT CONTRAST  TECHNIQUE: Contiguous axial images were obtained from the base of the skull through the vertex without intravenous contrast.  COMPARISON:  Brain CT 10/03/2014  FINDINGS: Ventricles and sulci are prominent, compatible with atrophy. Periventricular and subcortical white matter hypodensities compatible with chronic small vessel ischemic change. Basal ganglia calcifications. The orbits are unremarkable. Ethmoid and sphenoid sinus opacification, unchanged from prior. Mastoid air cells are well aerated. The calvarium is intact.  IMPRESSION: No acute intracranial process.   Electronically Signed   By: Annia Belt M.D.   On: 10/16/2014 11:26   Dg Chest Port 1 View  10/16/2014   CLINICAL DATA:  Altered mental status, history of diabetes, hypertension, dementia  EXAM: PORTABLE CHEST - 1 VIEW  COMPARISON:  08/18/2014  FINDINGS: There is no focal parenchymal opacity, pleural effusion, or pneumothorax. Stable cardiomediastinal silhouette. There is evidence of prior CABG.  The osseous structures are unremarkable.  IMPRESSION: No active disease.   Electronically Signed   By: Elige Ko   On: 10/16/2014 11:02   I discussed the patient's presentation with EMS providers upon arrival.    EKG Interpretation   Date/Time:  Saturday October 16 2014 10:44:45 EST Ventricular Rate:  90 PR Interval:  173 QRS  Duration: 111 QT Interval:  389 QTC Calculation: 476 R Axis:   14 Text Interpretation:  Sinus rhythm Inferior infarct, old Sinus rhythm  Artifact Abnormal ekg Confirmed by Gerhard Munch  MD [phone]) on  10/16/2014 12:42:34 PM     After the initial evaluation I reviewed the patient's chart.  Interestingly, the patient had ED presentation approximately 10 days ago with similar circumstances.  Subsequent he had MRI, EEG, vascular studies. Patient's labs today most notable for mild lactic acidosis, as well as drop in hemoglobin from 12 several days ago to 8 today. Given the patient's clinical improvement, but is low suspicion for ongoing hemorrhage MDM  Patient presents after an episode of unresponsiveness similar to prior events. Here the patient has awakened, is awake, alert, though he remains disoriented. Patient has no substantial ongoing complaint. There is evidence for lactic  acidosis, as well as anemia today.  Patient was admitted for further evaluation and management.    Gerhard Munchobert Errik Mitchelle, MD 10/16/14 [phone]

## 2014-10-17 DIAGNOSIS — E119 Type 2 diabetes mellitus without complications: Secondary | ICD-10-CM

## 2014-10-17 DIAGNOSIS — R55 Syncope and collapse: Secondary | ICD-10-CM

## 2014-10-17 DIAGNOSIS — F0390 Unspecified dementia without behavioral disturbance: Secondary | ICD-10-CM

## 2014-10-17 DIAGNOSIS — D6489 Other specified anemias: Secondary | ICD-10-CM

## 2014-10-17 DIAGNOSIS — E872 Acidosis: Secondary | ICD-10-CM

## 2014-10-17 DIAGNOSIS — I10 Essential (primary) hypertension: Secondary | ICD-10-CM

## 2014-10-17 LAB — BASIC METABOLIC PANEL WITH GFR
Anion gap: 13 (ref 5–15)
BUN: 19 mg/dL (ref 6–23)
CO2: 24 meq/L (ref 19–32)
Calcium: 9.1 mg/dL (ref 8.4–10.5)
Chloride: 104 meq/L (ref 96–112)
Creatinine, Ser: 1.15 mg/dL (ref 0.50–1.35)
GFR calc Af Amer: 67 mL/min — ABNORMAL LOW
GFR calc non Af Amer: 58 mL/min — ABNORMAL LOW
Glucose, Bld: 137 mg/dL — ABNORMAL HIGH (ref 70–99)
Potassium: 3.7 meq/L (ref 3.7–5.3)
Sodium: 141 meq/L (ref 137–147)

## 2014-10-17 LAB — IRON AND TIBC
Iron: 35 ug/dL — ABNORMAL LOW (ref 42–135)
Saturation Ratios: 16 % — ABNORMAL LOW (ref 20–55)
TIBC: 215 ug/dL (ref 215–435)
UIBC: 180 ug/dL (ref 125–400)

## 2014-10-17 LAB — GLUCOSE, CAPILLARY
GLUCOSE-CAPILLARY: 92 mg/dL (ref 70–99)
GLUCOSE-CAPILLARY: 178 mg/dL — AB (ref 70–99)
Glucose-Capillary: 204 mg/dL — ABNORMAL HIGH (ref 70–99)
Glucose-Capillary: 184 mg/dL — ABNORMAL HIGH (ref 70–99)

## 2014-10-17 LAB — FOLATE: FOLATE: 16.1 ng/mL

## 2014-10-17 LAB — CBC
HCT: 34.2 % — ABNORMAL LOW (ref 39.0–52.0)
HEMOGLOBIN: 11.5 g/dL — AB (ref 13.0–17.0)
MCH: 30.6 pg (ref 26.0–34.0)
MCHC: 33.6 g/dL (ref 30.0–36.0)
MCV: 91 fL (ref 78.0–100.0)
Platelets: 173 10*3/uL (ref 150–400)
RBC: 3.76 MIL/uL — AB (ref 4.22–5.81)
RDW: 12.6 % (ref 11.5–15.5)
WBC: 5.6 10*3/uL (ref 4.0–10.5)

## 2014-10-17 LAB — VITAMIN B12: Vitamin B-12: 1616 pg/mL — ABNORMAL HIGH (ref 211–911)

## 2014-10-17 LAB — FERRITIN: Ferritin: 146 ng/mL (ref 22–322)

## 2014-10-17 MED ORDER — ATORVASTATIN CALCIUM 80 MG PO TABS
80.0000 mg | ORAL_TABLET | Freq: Every day | ORAL | Status: DC
Start: 2014-10-17 — End: 2014-10-19
  Administered 2014-10-17 – 2014-10-18 (×2): 80 mg via ORAL
  Filled 2014-10-17 (×2): qty 1

## 2014-10-17 MED ORDER — LISINOPRIL 10 MG PO TABS
10.0000 mg | ORAL_TABLET | Freq: Once | ORAL | Status: AC
  Administered 2014-10-17: 10 mg via ORAL
  Filled 2014-10-17: qty 1

## 2014-10-17 MED ORDER — LISINOPRIL 10 MG PO TABS
10.0000 mg | ORAL_TABLET | Freq: Every day | ORAL | Status: DC
  Administered 2014-10-17 – 2014-10-18 (×2): 10 mg via ORAL
  Filled 2014-10-17 (×2): qty 1

## 2014-10-17 MED ORDER — VITAMIN D3 25 MCG (1000 UNIT) PO TABS
1000.0000 [IU] | ORAL_TABLET | Freq: Every day | ORAL | Status: DC
  Administered 2014-10-18: 1000 [IU] via ORAL
  Filled 2014-10-17: qty 1

## 2014-10-17 NOTE — Plan of Care (Signed)
Problem: Phase I Progression Outcomes Goal: Voiding-avoid urinary catheter unless indicated Outcome: Not Progressing Client requires a condom cath to avoid being saturated with urine.

## 2014-10-17 NOTE — Progress Notes (Addendum)
Subjective:   Day of hospitalization: 1  VSS.  No overnight events.  Pt has no complaints.     Objective:   Vital signs in last 24 hours: Filed Vitals:   10/16/14 1519 10/16/14 2043 10/17/14 0406 10/17/14 1124  BP: 184/69 174/75 182/66 176/74  Pulse: 74 69 76   Temp: 97.8 F (36.6 C) 98.1 F (36.7 C) 98.1 F (36.7 C)   TempSrc: Oral Oral Oral   Resp: 18 18 18    Height: 5\' 11"  (1.803 m)     Weight: 185 lb 10 oz (84.2 kg)  183 lb 6.8 oz (83.2 kg)   SpO2: 100% 99% 98%     Weight: Filed Weights   10/16/14 1519 10/17/14 0406  Weight: 185 lb 10 oz (84.2 kg) 183 lb 6.8 oz (83.2 kg)    I/Os:  Intake/Output Summary (Last 24 hours) at 10/17/14 1142 Last data filed at 10/17/14 [id number]  Gross per 24 hour  Intake 2953.25 ml  Output    500 ml  Net 2453.25 ml    Physical Exam: Constitutional: Vital signs reviewed.  Patient is sitting lying in bed in no acute distress and cooperative with exam.   HEENT: [place]/AT; PERRL, EOMI, no scleral icterus  Cardiovascular: RRR, +murmur Pulmonary/Chest: normal respiratory effort, no accessory muscle use, CTAB, no wheezes, rales, or rhonchi Abdominal: Soft. +BS, NT/ND Neurological: A&O x3, CN II-XII grossly intact; non-focal exam Extremities: 2+DP b/l, no C/C/E  Skin: Warm, dry and intact.   Lab Results:  BMP:  Recent Labs Lab 10/16/14 1030 10/17/14 0402  NA 142 141  K 3.8 3.7  CL 102 104  CO2 27 24  GLUCOSE 231* 137*  BUN 23 19  CREATININE 1.27 1.15  CALCIUM 9.2 9.1   CBC:  Recent Labs Lab 10/16/14 1030 10/17/14 0402  WBC 10.1 5.6  NEUTROABS 6.1  --   HGB 8.6* 11.5*  HCT 25.8* 34.2*  MCV 90.8 91.0  PLT 187 173    Coagulation:  Recent Labs Lab 10/16/14 1030  LABPROT 14.6  INR 1.12    CBG:            Recent Labs Lab 10/16/14 1009 10/16/14 1615 10/16/14 2133 10/17/14 0652 10/17/14 1104  GLUCAP 202* 254* 312* 92 178*           HA1C:      No results for input(s): HGBA1C in the last 168  hours.  Lipid Panel: No results for input(s): CHOL, HDL, LDLCALC, TRIG, CHOLHDL, LDLDIRECT in the last 168 hours.  LFTs:  Recent Labs Lab 10/16/14 1030  AST 15  ALT 13  ALKPHOS 67  BILITOT 0.4  PROT 6.8  ALBUMIN 3.3*    Pancreatic Enzymes: No results for input(s): LIPASE, AMYLASE in the last 168 hours.  Lactic Acid/Procalcitonin:  Recent Labs Lab 10/16/14 1106 10/16/14 1848  LATICACIDVEN 2.23* 1.1    Ammonia: No results for input(s): AMMONIA in the last 168 hours.  Cardiac Enzymes:  Recent Labs Lab 10/16/14 1449  TROPONINI <0.30    EKG: EKG Interpretation  Date/Time:  Saturday October 16 2014 10:44:45 EST Ventricular Rate:  90 PR Interval:  173 QRS Duration: 111 QT Interval:  389 QTC Calculation: 476 R Axis:   14 Text Interpretation:  Sinus rhythm Inferior infarct, old Sinus rhythm Artifact Abnormal ekg Confirmed by Gerhard MunchLOCKWOOD, ROBERT  MD ([id number]) on 10/16/2014 12:42:34 PM   BNP: No results for input(s): PROBNP in the last 168 hours.  D-Dimer: No results for input(s): DDIMER in  the last 168 hours.  Urinalysis:  Recent Labs Lab 10/16/14 1323  COLORURINE YELLOW  LABSPEC 1.021  PHURINE 5.0  GLUCOSEU >1000*  HGBUR NEGATIVE  BILIRUBINUR NEGATIVE  KETONESUR NEGATIVE  PROTEINUR NEGATIVE  UROBILINOGEN 1.0  NITRITE NEGATIVE  LEUKOCYTESUR NEGATIVE    Micro Results: Recent Results (from the past 240 hour(s))  MRSA PCR Screening     Status: None   Collection Time: 10/16/14  6:26 PM  Result Value Ref Range Status   MRSA by PCR NEGATIVE NEGATIVE Final    Comment:        The GeneXpert MRSA Assay (FDA approved for NASAL specimens only), is one component of a comprehensive MRSA colonization surveillance program. It is not intended to diagnose MRSA infection nor to guide or monitor treatment for MRSA infections.     Blood Culture: No results found for: SDES, SPECREQUEST, CULT, REPTSTATUS  Studies/Results: Ct Head Wo  Contrast  10/16/2014   CLINICAL DATA:  Altered mental status.  Unresponsive.  EXAM: CT HEAD WITHOUT CONTRAST  TECHNIQUE: Contiguous axial images were obtained from the base of the skull through the vertex without intravenous contrast.  COMPARISON:  Brain CT 10/03/2014  FINDINGS: Ventricles and sulci are prominent, compatible with atrophy. Periventricular and subcortical white matter hypodensities compatible with chronic small vessel ischemic change. Basal ganglia calcifications. The orbits are unremarkable. Ethmoid and sphenoid sinus opacification, unchanged from prior. Mastoid air cells are well aerated. The calvarium is intact.  IMPRESSION: No acute intracranial process.   Electronically Signed   By: Annia Beltrew  Davis M.D.   On: 10/16/2014 11:26   Dg Chest Port 1 View  10/16/2014   CLINICAL DATA:  Altered mental status, history of diabetes, hypertension, dementia  EXAM: PORTABLE CHEST - 1 VIEW  COMPARISON:  08/18/2014  FINDINGS: There is no focal parenchymal opacity, pleural effusion, or pneumothorax. Stable cardiomediastinal silhouette. There is evidence of prior CABG.  The osseous structures are unremarkable.  IMPRESSION: No active disease.   Electronically Signed   By: Elige KoHetal  Patel   On: 10/16/2014 11:02    Medications:  Scheduled Meds: . aspirin EC  325 mg Oral Daily  . atorvastatin  80 mg Oral q1800  . [START ON 10/18/2014] cholecalciferol  1,000 Units Oral Daily  . heparin  5,000 Units Subcutaneous 3 times per day  . hydrochlorothiazide  25 mg Oral Daily  . insulin aspart  0-9 Units Subcutaneous TID WC  . insulin glargine  10 Units Subcutaneous QHS  . insulin glargine  8 Units Subcutaneous QAC breakfast  . lisinopril  10 mg Oral Daily  . sodium chloride  3 mL Intravenous Q12H  . vitamin B-12  1,000 mcg Oral Daily   Continuous Infusions:  PRN Meds: acetaminophen **OR** acetaminophen  Antibiotics: Antibiotics Given (last 72 hours)    None      Day of Hospitalization: 1  Consults:     Assessment/Plan:   Principal Problem:   Syncope Active Problems:   Diabetes mellitus, type 2   Dementia   HTN (hypertension)   Syncope Pt has been worked up extensively for this in the past.  Likely d/t seizures per neurology given pt's h/o dementia.  Trop neg.  EKG unchanged from prior.  TTE 07/2014 shows AV moderate thickening and calcification c/w sclerosis.  LVEF: 55-60% with grade 1 diastolic dysfunction.  Telemetry NSR.  Appears alert this AM in NAD.  Lactic acidosis resolved with IVF.   -would consider cardiology referral as an outpatient  -PT to eval this AM -  stable for d/c back to ALF pending PT eval -would not resume seroquel-->this is contraindicated in the elderly (please see Beers criteria)-->there is an increased risk of stroke and mortality in the elderly   Acute drop in hemoglobin Hgb 8.6 on admission without s/s of bleeding.  Hgb back up today at 11.5 which likely represents true value and admission level was erroneous.   -monitor   F/E/N Fluids- None  Electrolytes- Replete as needed  Nutrition- Regular diet   VTE PPx  5000 Units Heparin SQ tid   Disposition Anticipated discharge today pending PT/OT evaluation.    LOS: 1 day   Marrian Salvage, MD PGY-2, Internal Medicine Teaching Service 10/17/2014, 11:42 AM

## 2014-10-17 NOTE — Progress Notes (Signed)
Physical Therapy Evaluation Patient Details Name: Todd [name] MRN: [id number] DOB: 1932-09-28 Today's Date: 10/17/2014   History of Present Illness  Patient is an 80 yo male admitted 10/16/14 following syncopal episode, with AMS and anemia.  PMH:  DM, HTN, HLD, CAD, s/p CABG, CKD, dementia, TIA.  Clinical Impression  Patient presents with problems listed below.  Will benefit from acute PT to address mobility issues.  Patient with significant dementia, weakness, and decreased balance, impacting functional mobility and safety.  Recommend SNF at discharge for continued therapy and 24 hour assist.    Follow Up Recommendations SNF;Supervision/Assistance - 24 hour    Equipment Recommendations  None recommended by PT    Recommendations for Other Services       Precautions / Restrictions Precautions Precautions: Fall Restrictions Weight Bearing Restrictions: No      Mobility  Bed Mobility                  Transfers Overall transfer level: Needs assistance Equipment used: Rolling walker (2 wheeled) Transfers: Sit to/from Stand Sit to Stand: Min guard         General transfer comment: Verbal and tactile cues for hand placement and safety.  Assist for balance.  Ambulation/Gait Ambulation/Gait assistance: Min assist Ambulation Distance (Feet): 84 Feet Assistive device: Rolling walker (2 wheeled) Gait Pattern/deviations: Step-through pattern;Staggering left;Staggering right;Trunk flexed   Gait velocity interpretation: Below normal speed for age/gender General Gait Details: Verbal and tactile cues for safe use of RW.  Required physical assist to maneuver RW during turns (patient picks up walker and attempts to turn holding RW in air).  Decreased balance with gait.  When walking into room, patient standing beside bathroom.  States he needs to go to bathroom.  Patient stopped at sink, and voided (has catheter), then had BM in floor.  Patient cleaned and returned to chair.   Floor cleaned.  Stairs            Wheelchair Mobility    Modified Rankin (Stroke Patients Only)       Balance Overall balance assessment: Needs assistance         Standing balance support: During functional activity;Bilateral upper extremity supported Standing balance-Leahy Scale: Poor                               Pertinent Vitals/Pain Pain Assessment: No/denies pain    Home Living Family/patient expects to be discharged to:: Skilled nursing facility                      Prior Function           Comments: Unable to obtain information from patient due to dementia     Hand Dominance   Dominant Hand: Right    Extremity/Trunk Assessment   Upper Extremity Assessment: Overall WFL for tasks assessed           Lower Extremity Assessment: Generalized weakness      Cervical / Trunk Assessment: Normal  Communication   Communication: No difficulties  Cognition Arousal/Alertness: Awake/alert Behavior During Therapy: Impulsive Overall Cognitive Status: No family/caregiver present to determine baseline cognitive functioning Area of Impairment: Orientation;Attention;Memory;Safety/judgement;Awareness;Problem solving Orientation Level: Disoriented to;Place;Time;Situation Current Attention Level: Sustained Memory: Decreased short-term memory   Safety/Judgement: Decreased awareness of deficits;Decreased awareness of safety   Problem Solving: Slow processing;Difficulty sequencing;Requires verbal cues General Comments: Patient states he lives here [facility]  Unable to give PLOF information.  General Comments      Exercises        Assessment/Plan    PT Assessment Patient needs continued PT services  PT Diagnosis Difficulty walking;Abnormality of gait;Generalized weakness;Altered mental status   PT Problem List Decreased strength;Decreased balance;Decreased mobility;Decreased cognition;Decreased knowledge of use of DME;Decreased  safety awareness  PT Treatment Interventions DME instruction;Gait training;Functional mobility training;Therapeutic activities;Balance training;Patient/family education   PT Goals (Current goals can be found in the Care Plan section) Acute Rehab PT Goals Patient Stated Goal: None stated PT Goal Formulation: Patient unable to participate in goal setting Time For Goal Achievement: 10/24/14 Potential to Achieve Goals: Fair    Frequency Min 2X/week   Barriers to discharge        Co-evaluation               End of Session Equipment Utilized During Treatment: Gait belt Activity Tolerance: Patient tolerated treatment well Patient left: in chair;with call bell/phone within reach;with chair alarm set Nurse Communication: Mobility status;Other (comment) (Loose yellow/mucous BM)    Functional Assessment Tool Used: Clinical judgement Functional Limitation: Mobility: Walking and moving around Mobility: Walking and Moving Around Current Status ([id number]): At least 20 percent but less than 40 percent impaired, limited or restricted Mobility: Walking and Moving Around Goal Status [phone](G8979): At least 1 percent but less than 20 percent impaired, limited or restricted    Time: 1130-1156 PT Time Calculation (min) (ACUTE ONLY): 26 min   Charges:   PT Evaluation $Initial PT Evaluation Tier I: 1 Procedure PT Treatments $Gait Training: 8-22 mins   PT G Codes:   Functional Assessment Tool Used: Clinical judgement Functional Limitation: Mobility: Walking and moving around    Vena AustriaDavis, Amana Bouska H 10/17/2014, 1:31 PM Durenda HurtSusan H. Renaldo Fiddleravis, PT, [facility] Acute [facility]hab Services Pager [phone]

## 2014-10-17 NOTE — Progress Notes (Signed)
Patient sleeping peacefully. NSS infusing at 125 ml/hour without difficulty. Patient maintained on the monitor and is in NSR with a heart rate of 73.  Contact isolation discontinued as MRSA swab results were negative.Will continue to monitor.  Sharlene Doryanya Vearl Aitken, RN

## 2014-10-17 NOTE — Progress Notes (Signed)
  Date: 10/17/2014  Patient name: Todd FendtMilton [name]  Medical record number: [id number]  Date of birth: 09/08/1932   I have seen and evaluated Todd [name] and discussed their care with the Residency Team. Briefly, Todd [name] is an 80yo man with PMH of DM2, HTN, HLD, CAD s/p CABG int he 90s, CKD and dementia who presented with AMS, possibly syncope.  He is a resident of [facility] and there was report that he became unresponsive at breakfast.  When asked about this, he does not seem to remember the exact event, but he does remember that he "passed out."  He denies any preceding symptoms including recent illness, chest pain, SOB, dizziness, lightheadedness, diarrhea, dysuria, N/V.  Todd [name] was recently hospitalized for a similar event, however, at that time he also had left sided weakness and he was considered to have a TIA.  At that time, it was noted that he had been having "black out" episodes for 2-3 years and he has had extensive work up for the problem inlcuing EEG in 07/2014 and 10/03/14, TTE, MRI/MRA, CD for possible TIA.  He has also been monitored on telemetry.  At discharge earlier this month, it was requested that his seroquel be stopped, however, he continues to be on this medication based on records.  It was also noted that he could benefit from tighter control of his DM and he was started on plavix. Initial labs showed some new anemia, but on repeat this normalized.    On exam, the patient is oriented to person, pleasantly demented, has no complaints.  CTAB, no wheezing, RR, NR, + systolic murmur, abdomen soft, NT, ND, +BS, neuro: following commands, no acute deficit.  A head CT was done which showed no acute findings.   Assessment and Plan: I have seen and evaluated the patient as outlined above. I agree with the formulated Assessment and Plan as detailed in the residents' admission note, with the following changes:   1. Syncope, recurrent - apparently been ongoing for 2-3 years.  No obvious  etiology has been found with investigations into seizures (EEG X 2), cardiac (TTE, telemetry, ACS rule out), neuro (CT head, MRI/MRA at last visit), orthostasis (negative orthostatic vital signs at this time).  Vasovagal would be hard to tease out given his mental status at baseline.  This may be a medication effect as well as he was taking seroquel BID per records.  - D/c seroquel; if NH considers this medication a necessity, minimal dose at night only - PT/OT - I am not sure that another extensive work up would be warranted at this time.  He has no symptoms suggestive of a TIA at this time, so continuation of his current medications is likely adequate.   - Advance to SNF care possible (currently at ALF)  2. Lactic Acidosis  - Resolved on recheck  3. Acute on chronic anemia - Resolved to baseline on recheck  4. DM2 - Home meds and sliding scale  5. Dementia - See above about seroquel  6. HTN - Elevated this AM, would resume home meds  For further detail, please see resident admission note.      Todd CatalinaEmily B Mullen, MD 12/20/201510:55 AM

## 2014-10-17 NOTE — Progress Notes (Signed)
Utilization Review Completed.   Ayelen Sciortino, RN, BSN Nurse Case Manager  

## 2014-10-17 NOTE — Progress Notes (Signed)
Client ambulates with minimal difficulty, however, does not understand the concept of safety and become belligerent with staff when approached to sit in the recliner for safety.  Currently up in a standard chair and is being monitored..[name]

## 2014-10-18 ENCOUNTER — Inpatient Hospital Stay
Admission: RE | Admit: 2014-10-18 | Discharge: 2015-03-16 | Disposition: A | Discharge status: Skilled Nursing Facility | Payer: Medicare PPO | Source: Ambulatory Visit | Attending: Internal Medicine | Admitting: Internal Medicine

## 2014-10-18 DIAGNOSIS — R609 Edema, unspecified: Secondary | ICD-10-CM

## 2014-10-18 DIAGNOSIS — Q899 Congenital malformation, unspecified: Secondary | ICD-10-CM

## 2014-10-18 LAB — GLUCOSE, CAPILLARY
GLUCOSE-CAPILLARY: 76 mg/dL (ref 70–99)
GLUCOSE-CAPILLARY: 75 mg/dL (ref 70–99)
GLUCOSE-CAPILLARY: 247 mg/dL — AB (ref 70–99)
GLUCOSE-CAPILLARY: 233 mg/dL — AB (ref 70–99)
GLUCOSE-CAPILLARY: 167 mg/dL — AB (ref 70–99)
Glucose-Capillary: 148 mg/dL — ABNORMAL HIGH (ref 70–99)

## 2014-10-18 MED ORDER — DIVALPROEX SODIUM 500 MG PO DR TAB
500.0000 mg | DELAYED_RELEASE_TABLET | Freq: Two times a day (BID) | ORAL | Status: DC
  Administered 2014-10-18: 500 mg via ORAL
  Filled 2014-10-18 (×2): qty 1

## 2014-10-18 MED ORDER — LISINOPRIL 10 MG PO TABS
10.0000 mg | ORAL_TABLET | Freq: Once | ORAL | Status: AC
  Administered 2014-10-18: 10 mg via ORAL
  Filled 2014-10-18: qty 1

## 2014-10-18 MED ORDER — LISINOPRIL 20 MG PO TABS: 20.0000 mg | ORAL_TABLET | Freq: Every day | ORAL | Status: DC

## 2014-10-18 MED ORDER — DIVALPROEX SODIUM 500 MG PO DR TAB: 500.0000 mg | DELAYED_RELEASE_TABLET | Freq: Two times a day (BID) | ORAL | Status: DC

## 2014-10-18 NOTE — Evaluation (Signed)
Occupational Therapy Evaluation Patient Details Name: Todd [name] MRN: [id number] DOB: Sep 10, 1932 Today's Date: 10/18/2014    History of Present Illness Patient is an 80 yo male admitted 10/16/14 following syncopal episode, with AMS and anemia.  PMH:  DM, HTN, HLD, CAD, s/p CABG, CKD, dementia, TIA.   Clinical Impression   Pt. Is easily agitated but can be redirected. Pt. Is able to follow 1 step directions consistantly. Pt. Is S to Min A with ADLs currently. Pt. Is S to CGA with mobility. Pt. Decreased cognition is main factor in pt. Requirements for A. Pt. Is motivated to increase ability with ADLs and mobility to d/c home.     Follow Up Recommendations  SNF    Equipment Recommendations  None recommended by OT    Recommendations for Other Services       Precautions / Restrictions Precautions Precautions: Fall Restrictions Weight Bearing Restrictions: No      Mobility Bed Mobility Overal bed mobility: Needs Assistance Bed Mobility: Sidelying to Sit   Sidelying to sit: Supervision          Transfers Overall transfer level: Needs assistance     Sit to Stand: Min guard              Balance                                            ADL Overall ADL's : Needs assistance/impaired Eating/Feeding: Set up   Grooming: Wash/dry hands;Wash/dry face;Oral care;Minimal assistance   Upper Body Bathing: Set up   Lower Body Bathing: Minimal assistance   Upper Body Dressing : Minimal assistance   Lower Body Dressing: Minimal assistance   Toilet Transfer: Min guard   Toileting- Clothing Manipulation and Hygiene: Moderate assistance       Functional mobility during ADLs: Min guard;Cueing for safety General ADL Comments:  (Pt. easily becomes agitated)     Vision                     Perception     Praxis      Pertinent Vitals/Pain Pain Assessment: No/denies pain     Hand Dominance Right   Extremity/Trunk Assessment  Upper Extremity Assessment Upper Extremity Assessment: Generalized weakness           Communication Communication Communication: No difficulties   Cognition Arousal/Alertness: Awake/alert Behavior During Therapy: Impulsive Overall Cognitive Status: No family/caregiver present to determine baseline cognitive functioning Area of Impairment: Orientation;Memory;Safety/judgement;Awareness;Problem solving Orientation Level: Place;Time;Situation Current Attention Level: Sustained Memory: Decreased short-term memory   Safety/Judgement: Decreased awareness of safety;Decreased awareness of deficits   Problem Solving: Slow processing     General Comments       Exercises       Shoulder Instructions      Home Living Family/patient expects to be discharged to:: Skilled nursing facility                                        Prior Functioning/Environment          Comments:  (unable to assess secondary to pt. cognition.)    OT Diagnosis: Generalized weakness;Cognitive deficits   OT Problem List: Decreased strength;Decreased activity tolerance;Decreased cognition;Decreased safety awareness;Decreased knowledge of precautions   OT Treatment/Interventions: Self-care/ADL training;Neuromuscular education;DME and/or AE  instruction    OT Goals(Current goals can be found in the care plan section) Acute Rehab OT Goals Patient Stated Goal:  (none stated) OT Goal Formulation: With patient Time For Goal Achievement: 11/01/14 Potential to Achieve Goals: Good ADL Goals Pt Will Perform Grooming: with modified independence;standing Pt Will Perform Lower Body Bathing: with supervision;sit to/from stand Pt Will Perform Lower Body Dressing: with supervision;sit to/from stand Pt Will Transfer to Toilet: with supervision;ambulating Pt Will Perform Toileting - Clothing Manipulation and hygiene: with supervision;sit to/from stand  OT Frequency: Min 1X/week   Barriers to D/C:  Decreased caregiver support          Co-evaluation              End of Session Equipment Utilized During Treatment: Gait belt  Activity Tolerance: Patient tolerated treatment well Patient left: in bed;with call bell/phone within reach;with bed alarm set   Time: [id number] OT Time Calculation (min): 43 min Charges:  OT General Charges $OT Visit: 1 Procedure OT Evaluation $Initial OT Evaluation Tier I: 1 Procedure G-Codes: OT G-codes **NOT FOR INPATIENT CLASS** Functional Limitation: Self care Self Care Current Status ([id number]): At least 20 percent but less than 40 percent impaired, limited or restricted Self Care Goal Status (J1914(G8988): At least 1 percent but less than 20 percent impaired, limited or restricted  Todd [name] 10/18/2014, 8:32 AM

## 2014-10-18 NOTE — Progress Notes (Signed)
Patient's blood sugar this morning was 77. After several containers of Oj and apple juice along with graham crackers with peanut butter, patient's recheck of blood sugar  Was 148.  Sharlene Doryanya Tayler Heiden, RN

## 2014-10-18 NOTE — Progress Notes (Signed)
Internal Medicine Attending  Date: 10/18/2014  Patient name: Todd [name] Medical record number: [id number] Date of birth: 1932-03-17 Age: 80 y.o. Gender: male  I saw and evaluated the patient. I discussed patient and reviewed the resident's note by Dr. Leatha GildingMallory, and I agree with the resident's findings and plans as documented in her note, with the following additional comments.  Given recurrent episodes of loss of consciousness without obvious explanation, would consult neurology regarding whether empiric anti-seizure medication is warranted.

## 2014-10-18 NOTE — Discharge Instructions (Signed)
Discontinue seroquel given episodes of AMS in an elderly man.  Increased lisinopril to 20 mg daily for hypertension to 180s on this admission.

## 2014-10-18 NOTE — Discharge Summary (Signed)
Name: Todd [name] MRN: [id number] DOB: [birth year] 80 y.o. PCP: Marletta Lor, NP  Date of Admission: 10/16/2014 10:01 AM Date of Discharge: 10/18/2014 Attending Physician: Inez Catalina, MD  Discharge Diagnosis: Recurrent Syncope  Other Chronic Problems: DMII Dementia Hypertension CKD Stage IIIA  Discharge Medications:   Medication List    STOP taking these medications        QUEtiapine 50 MG tablet  Commonly known as:  SEROQUEL      TAKE these medications        aspirin EC 325 MG tablet  Take 325 mg by mouth daily.     atorvastatin 80 MG tablet  Commonly known as:  LIPITOR  Take 1 tablet (80 mg total) by mouth daily at 6 PM.     divalproex 500 MG DR tablet  Commonly known as:  DEPAKOTE  Take 1 tablet (500 mg total) by mouth every 12 (twelve) hours.     hydrochlorothiazide 25 MG tablet  Commonly known as:  HYDRODIURIL  Take 25 mg by mouth daily.     insulin glargine 100 UNIT/ML injection  Commonly known as:  LANTUS  Inject 8-10 Units into the skin 2 (two) times daily. 8 units subq every morning at 7am and 10 units at bedtime     lisinopril 20 MG tablet  Commonly known as:  PRINIVIL,ZESTRIL  Take 1 tablet (20 mg total) by mouth daily.  Start taking on:  10/19/2014     vitamin B-12 1000 MCG tablet  Commonly known as:  CYANOCOBALAMIN  Take 1,000 mcg by mouth daily.     Vitamin D-3 1000 UNITS Caps  Take 1,000 Units by mouth daily.        Disposition and follow-up:   [name] was discharged from [facility] in Stable condition.  At the [name] follow up visit please address:  1.  Todd [name] for yet another episode of syncope. As full cardiac and neurologic workups have been completed on the last 3 admissions, these were not undertaken during this hospitalization. His orthostatics were negative. Consider d/c seroquel if possible. Also, started depakote 500 mg BID as empiric anti-epileptic therapy.  Patient got one dose in the [name] and needs a depakote level in 2 weeks (11/01/14) and adjust accordingly. Both the empiric anti-epileptic and sending the patient out after one dose of depakote were advised by neurology.   He was hypertensive to the 180s SBP on this admission. Lisinopril dose was increased to 20 mg daily.  His last A1c was 12.3%; may want to consider increasing home insulin regimen.   Of note, a code discussion was had with the patient's POA, his daughter, Todd [name]. Please see below for details.  2.  Labs / imaging needed at time of follow-up: depakote level on 11/01/14  3.  Pending labs/ test needing follow-up: none  Follow-up Appointments: At SNF  Discharge Instructions: New depakote medication; please check depakote level and adjust accordingly on 11/01/14  Consultations:  none  Procedures Performed:  Ct Head Wo Contrast  10/16/2014   CLINICAL DATA:  Altered mental status.  Unresponsive.  EXAM: CT HEAD WITHOUT CONTRAST  TECHNIQUE: Contiguous axial images were obtained from the base of the skull through the vertex without intravenous contrast.  COMPARISON:  Brain CT 10/03/2014  FINDINGS: Ventricles and sulci are prominent, compatible with atrophy. Periventricular and subcortical white matter hypodensities compatible with chronic small vessel ischemic change. Basal ganglia calcifications. The orbits are unremarkable. Ethmoid and sphenoid sinus  opacification, unchanged from prior. Mastoid air cells are well aerated. The calvarium is intact.  IMPRESSION: No acute intracranial process.   Electronically Signed   By: Annia Beltrew  Davis M.D.   On: 10/16/2014 11:26   Ct Head Wo Contrast  10/03/2014   CLINICAL DATA:  Code stroke  EXAM: CT HEAD WITHOUT CONTRAST  TECHNIQUE: Contiguous axial images were obtained from the base of the skull through the vertex without contrast.  COMPARISON:  08/31/2014  FINDINGS: Stable diffuse brain atrophy and chronic white matter microvascular ischemic changes.  No acute intracranial hemorrhage, mass lesion, definite infarction, focal edema or mass effect. No extra-axial fluid collection, midline shift, herniation or hydrocephalus. Normal gray-white matter differentiation maintained. Cisterns are patent. Cerebellar atrophy as well. Orbits are symmetric. Mastoids are clear. Bilateral ethmoid and left sphenoid sinus opacity appears chronic.  IMPRESSION: Stable brain atrophy and chronic white matter ischemic change. No acute finding or interval change by CT.  These results were called by telephone at the time of interpretation on 10/03/2014 at 9:43 am to Dr. Hosie Poissonsumner , who verbally acknowledged these results.   Electronically Signed   By: Ruel Favorsrevor  Shick M.D.   On: 10/03/2014 09:42   Mr Brain Wo Contrast  10/04/2014   CLINICAL DATA:  80 year old hypertensive diabetic male with history of dementia and chronic kidney disease presenting with unresponsiveness with left-sided weakness. Initial encounter.  EXAM: MRI HEAD WITHOUT CONTRAST  MRA HEAD WITHOUT CONTRAST  TECHNIQUE: Multiplanar, multiecho pulse sequences of the brain and surrounding structures were obtained without intravenous contrast. Angiographic images of the head were obtained using MRA technique without contrast.  COMPARISON:  10/03/2014 CT.  08/18/2014 MR.  FINDINGS: MRI HEAD FINDINGS  No acute infarct. Minimal increased signal superior aspect of the right lenticular nucleus without significant change and therefore not felt to represent an acute infarct.  No intracranial hemorrhage.  Mild small vessel disease type changes.  No intracranial mass lesion noted on this unenhanced exam.  Global atrophy without hydrocephalus.  Persistent opacification left ethmoid sinus air cells and left sphenoid sinus with restricted motion and therefore acute sinusitis is a possibility.  Minimal exophthalmos.  MRA HEAD FINDINGS  Exam is motion degraded.  Right internal carotid artery cavernous segment mild to moderate irregularity and  narrowing  Left internal carotid artery cavernous segment mild narrowing and irregularity.  Carotid terminus mild irregularity and narrowing greater on the left.  High-grade stenosis M1 segment right middle cerebral artery with decrease number of visualized right middle cerebral artery branches. The right middle cerebral artery branch vessel which is visualized is significantly narrowed and irregular.  Mild to moderate narrowing and irregularity involving portions of the M1 segment and branches of the left middle cerebral artery.  Mild to moderate narrowing of portions of the A1 segment and A2 segment of the anterior cerebral artery bilaterally.  Mild to moderate narrowing distal left vertebral artery.  Poor delineation of the left posterior inferior cerebellar artery.  Nonvisualized anterior inferior cerebellar artery early bilaterally.  Mild narrowing of the mid to distal aspect of the basilar artery.  Mild to moderate narrowing of the superior cerebellar artery greater on the left.  Marked focal stenosis proximal right posterior cerebral artery with moderate narrowing beyond this region.  Moderate narrowing P2 segment left posterior cerebral artery with mild moderate narrowing distal branches.  No aneurysm detected.  IMPRESSION: MRI HEAD  No acute infarct detected.  Mild small vessel disease type changes.  Global atrophy without hydrocephalus.  Persistent opacification left ethmoid  sinus air cells and left sphenoid sinus with restricted motion and therefore acute sinusitis is a possibility.  MRA HEAD  Exam is motion degraded.  Significant atherosclerotic type changes. Most notable findings involve the right middle cerebral artery and right posterior cerebral artery including:  High-grade stenosis M1 segment right middle cerebral artery with decrease number of visualized right middle cerebral artery branches. The right middle cerebral artery branch vessel which is visualized is significantly narrowed and  irregular.  Marked focal stenosis proximal right posterior cerebral artery.  Please see above for further detail.   Electronically Signed   By: Bridgett Larsson M.D.   On: 10/04/2014 09:01   Dg Chest Port 1 View  10/16/2014   CLINICAL DATA:  Altered mental status, history of diabetes, hypertension, dementia  EXAM: PORTABLE CHEST - 1 VIEW  COMPARISON:  08/18/2014  FINDINGS: There is no focal parenchymal opacity, pleural effusion, or pneumothorax. Stable cardiomediastinal silhouette. There is evidence of prior CABG.  The osseous structures are unremarkable.  IMPRESSION: No active disease.   Electronically Signed   By: Elige Ko   On: 10/16/2014 11:02   Mr Maxine Glenn Head/brain Wo Cm  10/04/2014   CLINICAL DATA:  80 year old hypertensive diabetic male with history of dementia and chronic kidney disease presenting with unresponsiveness with left-sided weakness. Initial encounter.  EXAM: MRI HEAD WITHOUT CONTRAST  MRA HEAD WITHOUT CONTRAST  TECHNIQUE: Multiplanar, multiecho pulse sequences of the brain and surrounding structures were obtained without intravenous contrast. Angiographic images of the head were obtained using MRA technique without contrast.  COMPARISON:  10/03/2014 CT.  08/18/2014 MR.  FINDINGS: MRI HEAD FINDINGS  No acute infarct. Minimal increased signal superior aspect of the right lenticular nucleus without significant change and therefore not felt to represent an acute infarct.  No intracranial hemorrhage.  Mild small vessel disease type changes.  No intracranial mass lesion noted on this unenhanced exam.  Global atrophy without hydrocephalus.  Persistent opacification left ethmoid sinus air cells and left sphenoid sinus with restricted motion and therefore acute sinusitis is a possibility.  Minimal exophthalmos.  MRA HEAD FINDINGS  Exam is motion degraded.  Right internal carotid artery cavernous segment mild to moderate irregularity and narrowing  Left internal carotid artery cavernous segment mild  narrowing and irregularity.  Carotid terminus mild irregularity and narrowing greater on the left.  High-grade stenosis M1 segment right middle cerebral artery with decrease number of visualized right middle cerebral artery branches. The right middle cerebral artery branch vessel which is visualized is significantly narrowed and irregular.  Mild to moderate narrowing and irregularity involving portions of the M1 segment and branches of the left middle cerebral artery.  Mild to moderate narrowing of portions of the A1 segment and A2 segment of the anterior cerebral artery bilaterally.  Mild to moderate narrowing distal left vertebral artery.  Poor delineation of the left posterior inferior cerebellar artery.  Nonvisualized anterior inferior cerebellar artery early bilaterally.  Mild narrowing of the mid to distal aspect of the basilar artery.  Mild to moderate narrowing of the superior cerebellar artery greater on the left.  Marked focal stenosis proximal right posterior cerebral artery with moderate narrowing beyond this region.  Moderate narrowing P2 segment left posterior cerebral artery with mild moderate narrowing distal branches.  No aneurysm detected.  IMPRESSION: MRI HEAD  No acute infarct detected.  Mild small vessel disease type changes.  Global atrophy without hydrocephalus.  Persistent opacification left ethmoid sinus air cells and left sphenoid sinus with restricted motion  and therefore acute sinusitis is a possibility.  MRA HEAD  Exam is motion degraded.  Significant atherosclerotic type changes. Most notable findings involve the right middle cerebral artery and right posterior cerebral artery including:  High-grade stenosis M1 segment right middle cerebral artery with decrease number of visualized right middle cerebral artery branches. The right middle cerebral artery branch vessel which is visualized is significantly narrowed and irregular.  Marked focal stenosis proximal right posterior cerebral  artery.  Please see above for further detail.   Electronically Signed   By: Bridgett LarssonSteve  Olson M.D.   On: 10/04/2014 09:01    Admission HPI:  Todd [name] is an 80 year old man resident of [facility] with a history of DMII, hypertension, hyperlipidemia, CAD s/p CABG in 1994, CKD Stage III and dementia who is presenting with altered mental status. He became unresponsive at breakfast this morning. He was found slumped over at the breakfast table. Upon arrival of EMS, he was minimally responsive but improved en route. Limited history due to patient's dementia. Denies fevers, chills, vision changes, trouble swallowing, SOB, CP, N/V, LH/dizziness. Reports dysuria and diarrhea.  Recently hospitalized for altered mental status 10/03/2014 to 10/05/2014 with new left sided weakness. On that admission, it was determined that he likely had experienced a TIA. Better DMII control, the addition of plavix, and the discontinuation of seroquel were proposed at that time.  [name] Course by problem list:   Todd [name] is an 80 yo man with DM2, HTN, HLD, CAD s/p CABG in 1990s, CKD and dementia who presented with AMS over the weekend. This is his [facility] in the [facility] (though on last admission, he also had some left-sided weakness); his brother shared during the last admission that "blank outs" have been ongoing for the past 2-3 years. He has undergone EEG in 07/2014 and 09/2014, TTE, MRI/MRA, carotid dopplers and telemetry. He was still on Seroquel on admission; may be contributing to or causing his symptoms.  Recurrent Syncope / AMS at ALF: CT head negative for acute findings. Ongoing occurrence over 2-3 years with no obvious cardiac etiology (TTE, telemetry, ACS r/o negative over past 3 hospitalizations), neuro (CT head this admission, MRI/MRA last admission, EEGx2 in 07/2014 and 09/2014 negative), orthostasis (negative this admission). Vasovagal  would be difficult to elicit given the patient's dementia. This may be a medication side effect, particularly given that he was taking seroquel BID. Seroquel was discontinued on this admission, as this is contraindicated in the elderly (Beers criteria); if this is absolutely necessary, could consider minimal dose at night only. Asked neurology to weigh in on appropriateness of empiric anti-epileptic therapy; recommended depakote 500 mg BID to be started on day of discharge.  HTN: Ran up to the 180s/100s on his home HCTZ 25 mg daily and lisinopril 10 mg daily. Increased lisinopril to 20 mg daily.   DMII: Last A1c 10/03/2014 12.3%. On home lantus. Consider higher dosing at home, given this A1c.  Dementia: At baseline per family. He has never seen a neurologist as an outpatient, though neuropsychiatry evaluation has been recommended at prior hospitalizations.   CKD Stage IIIA: Baseline Cr ~1.50. Currently 1.15 (below baseline) with GFR 67.  Discharge Vitals:   BP 186/105 mmHg  Pulse 76  Temp(Src) 98 F (36.7 C) (Oral)  Resp 19  Ht 5\' 11"  (1.803 m)  Wt 186 lb 8.2 oz (84.6 kg)  BMI 26.02 kg/m2  SpO2 100%  Discharge Labs:  Results for orders placed or performed during the [name] encounter of 10/16/14 (from the past 24 hour(s))  Glucose, capillary     Status: Abnormal   Collection Time: 10/17/14 11:04 AM  Result Value Ref Range   Glucose-Capillary 178 (H) 70 - 99 mg/dL   Comment 1 Notify RN   Glucose, capillary     Status: Abnormal   Collection Time: 10/17/14  4:03 PM  Result Value Ref Range   Glucose-Capillary 204 (H) 70 - 99 mg/dL   Comment 1 Documented in Chart    Comment 2 Notify RN   Glucose, capillary     Status: Abnormal   Collection Time: 10/17/14  9:42 PM  Result Value Ref Range   Glucose-Capillary 184 (H) 70 - 99 mg/dL  Glucose, capillary     Status: None   Collection Time: 10/18/14  6:24 AM  Result Value Ref Range   Glucose-Capillary 76 70 - 99 mg/dL   Comment 1 Notify  RN   Glucose, capillary     Status: None   Collection Time: 10/18/14  6:43 AM  Result Value Ref Range   Glucose-Capillary 75 70 - 99 mg/dL  Glucose, capillary     Status: Abnormal   Collection Time: 10/18/14  7:22 AM  Result Value Ref Range   Glucose-Capillary 148 (H) 70 - 99 mg/dL    Had code status discussion with patient's power of attorney, his only child (wife is deceased). She confirmed what she told the social worker, that Mr. Zepeda is DNR.  Signed: Dionne Ano, MD 10/18/2014, 10:34 AM    Services Ordered on Discharge: SNF Equipment Ordered on Discharge: none

## 2014-10-18 NOTE — Consult Note (Signed)
Referral received to [facility]. Unfortunately, the patient is not eligible [facility] due to patient's primary care provider in not a [facility] and not [facility].  Spoke with the patient's daughter, Neysa BonitoChristy at [phone], HIPPA verified. Daughter states she has spoken to the hospital social worker earlier. She confirmed that the patient's primary care provider is Marletta LorJulie Barr, NP who sees the patient in the facility.  Will notify inpatient RNCM that the patient is not eligible for services.  For questions, please call Charlesetta ShanksVictoria Veron Senner, RN, BSN, [facility] Liaison, [facility] at [phone].

## 2014-10-18 NOTE — Progress Notes (Signed)
UR completed 

## 2014-10-18 NOTE — Progress Notes (Signed)
Patient has been up and down multiple times throughout the night secondary to confused state. Be alarm maintained in place. Patient maintained on the monitor and in NSR with a heart rate of 68. Ambulated the patient x  two in the hallway to help patient calm down and relax enough to go back to bed. Patient can become resistant to assistance at times and can also become aggressive, but easily redirected.  Sharlene Doryanya Graceanne Guin, RN

## 2014-10-18 NOTE — Care Management Note (Unsigned)
    Page 1 of 1   10/18/2014     4:31:28 PM CARE MANAGEMENT NOTE 10/18/2014  Patient:  Todd [name],Todd [name]   Account Number:  [id number]  Date Initiated:  10/18/2014  Documentation initiated by:  Aksel Bencomo  Subjective/Objective Assessment:   Pt adm on 10/16/14 with syncope.  PTA, [facility].     Action/Plan:   PT recommending SNF level of care at dc.  Will consult CSW to facilitate poss dc to SNF when medically ready.   Anticipated DC Date:  10/19/2014   Anticipated DC Plan:  SKILLED NURSING FACILITY  In-house referral  Clinical Social Worker      DC Planning Services  CM consult      Choice offered to / List presented to:             Status of service:  In process, will continue to follow Medicare Important Message given?   (If response is "NO", the following Medicare IM given date fields will be blank) Date Medicare IM given:   Medicare IM given by:   Date Additional Medicare IM given:   Additional Medicare IM given by:    Discharge Disposition:    Per UR Regulation:  Reviewed for med. necessity/level of care/duration of stay  If discussed at Long Length of Stay Meetings, dates discussed:    Comments:

## 2014-10-18 NOTE — Progress Notes (Addendum)
Subjective: Todd [name] feels "good" this morning. He is not in pain.   Objective: Vital signs in last 24 hours: Filed Vitals:   10/17/14 0406 10/17/14 1124 10/17/14 2049 10/18/14 0617  BP: 182/66 176/74 181/110 186/105  Pulse: 76  83 76  Temp: 98.1 F (36.7 C)  98.4 F (36.9 C) 98 F (36.7 C)  TempSrc: Oral  Oral Oral  Resp: 18  18 19   Height:      Weight: 183 lb 6.8 oz (83.2 kg)   186 lb 8.2 oz (84.6 kg)  SpO2: 98%  100% 100%   Weight change: 14.1 oz (0.4 kg)  Intake/Output Summary (Last 24 hours) at 10/18/14 1014 Last data filed at 10/18/14 0919  Gross per 24 hour  Intake    480 ml  Output    300 ml  Net    180 ml   Physical Exam: Constitutional: Vital signs reviewed. Patient is sitting up in bed in no acute distress; has just finished breakfast, pleasant HEENT: [place]/AT; PERRL, EOMI, no scleral icterus  Cardiovascular: RRR, + systolic murmur Pulmonary/Chest: normal respiratory effort, no accessory muscle use, CTAB, no wheezes, rales, or rhonchi Abdominal: Soft. +BS, NT/ND Neurological: A&O x1, unaware of date or time (believes we are [place] the post [place]), CN II-XII grossly intact; non-focal exam, able to follow commands, no weakness Extremities: 2+DP b/l, no C/C/E  Skin: Warm, dry and intact  Lab Results: Basic Metabolic Panel:  Recent Labs Lab 10/16/14 1030 10/17/14 0402  NA 142 141  K 3.8 3.7  CL 102 104  CO2 27 24  GLUCOSE 231* 137*  BUN 23 19  CREATININE 1.27 1.15  CALCIUM 9.2 9.1   Liver Function Tests:  Recent Labs Lab 10/16/14 1030  AST 15  ALT 13  ALKPHOS 67  BILITOT 0.4  PROT 6.8  ALBUMIN 3.3*   CBC:  Recent Labs Lab 10/16/14 1030 10/17/14 0402  WBC 10.1 5.6  NEUTROABS 6.1  --   HGB 8.6* 11.5*  HCT 25.8* 34.2*  MCV 90.8 91.0  PLT 187 173   Cardiac Enzymes:  Recent Labs Lab 10/16/14 1449  TROPONINI <0.30   CBG:  Recent Labs Lab 10/17/14 1104 10/17/14 1603 10/17/14 2142 10/18/14 0624 10/18/14 0643 10/18/14 0722   GLUCAP 178* 204* 184* 76 75 148*   Coagulation:  Recent Labs Lab 10/16/14 1030  LABPROT 14.6  INR 1.12   Anemia Panel:  Recent Labs Lab 10/16/14 1848  VITAMINB12 1616*  FOLATE 16.1  FERRITIN 146  TIBC 215  IRON 35*  RETICCTPCT 1.0   Urine Drug Screen: Drugs of Abuse     Component Value Date/Time   LABOPIA NONE DETECTED 10/03/2014 1847   COCAINSCRNUR NONE DETECTED 10/03/2014 1847   LABBENZ NONE DETECTED 10/03/2014 1847   AMPHETMU NONE DETECTED 10/03/2014 1847   THCU NONE DETECTED 10/03/2014 1847   LABBARB NONE DETECTED 10/03/2014 1847    Urinalysis:  Recent Labs Lab 10/16/14 1323  COLORURINE YELLOW  LABSPEC 1.021  PHURINE 5.0  GLUCOSEU >1000*  HGBUR NEGATIVE  BILIRUBINUR NEGATIVE  KETONESUR NEGATIVE  PROTEINUR NEGATIVE  UROBILINOGEN 1.0  NITRITE NEGATIVE  LEUKOCYTESUR NEGATIVE    Micro Results: Recent Results (from the past 240 hour(s))  MRSA PCR Screening     Status: None   Collection Time: 10/16/14  6:26 PM  Result Value Ref Range Status   MRSA by PCR NEGATIVE NEGATIVE Final    Comment:        The GeneXpert MRSA Assay (FDA approved for  NASAL specimens only), is one component of a comprehensive MRSA colonization surveillance program. It is not intended to diagnose MRSA infection nor to guide or monitor treatment for MRSA infections.    Studies/Results: Ct Head Wo Contrast  10/16/2014   CLINICAL DATA:  Altered mental status.  Unresponsive.  EXAM: CT HEAD WITHOUT CONTRAST  TECHNIQUE: Contiguous axial images were obtained from the base of the skull through the vertex without intravenous contrast.  COMPARISON:  Brain CT 10/03/2014  FINDINGS: Ventricles and sulci are prominent, compatible with atrophy. Periventricular and subcortical white matter hypodensities compatible with chronic small vessel ischemic change. Basal ganglia calcifications. The orbits are unremarkable. Ethmoid and sphenoid sinus opacification, unchanged from prior. Mastoid air  cells are well aerated. The calvarium is intact.  IMPRESSION: No acute intracranial process.   Electronically Signed   By: Annia Belt M.D.   On: 10/16/2014 11:26   Dg Chest Port 1 View  10/16/2014   CLINICAL DATA:  Altered mental status, history of diabetes, hypertension, dementia  EXAM: PORTABLE CHEST - 1 VIEW  COMPARISON:  08/18/2014  FINDINGS: There is no focal parenchymal opacity, pleural effusion, or pneumothorax. Stable cardiomediastinal silhouette. There is evidence of prior CABG.  The osseous structures are unremarkable.  IMPRESSION: No active disease.   Electronically Signed   By: Elige Ko   On: 10/16/2014 11:02   Medications: I have reviewed the patient's current medications. Scheduled Meds: . aspirin EC  325 mg Oral Daily  . atorvastatin  80 mg Oral q1800  . cholecalciferol  1,000 Units Oral Daily  . heparin  5,000 Units Subcutaneous 3 times per day  . hydrochlorothiazide  25 mg Oral Daily  . insulin aspart  0-9 Units Subcutaneous TID WC  . insulin glargine  10 Units Subcutaneous QHS  . insulin glargine  8 Units Subcutaneous QAC breakfast  . lisinopril  10 mg Oral Daily  . sodium chloride  3 mL Intravenous Q12H  . vitamin B-12  1,000 mcg Oral Daily   Continuous Infusions:  PRN Meds:.acetaminophen **OR** acetaminophen Assessment/Plan: Principal Problem:   Syncope Active Problems:   Diabetes mellitus, type 2   Dementia   HTN (hypertension)  Todd [name] is an 80 yo man with DM2, HTN, HLD, CAD s/p CABG in 1990s, CKD and dementia who presented with AMS over the weekend. This is his third admission in the past three months from [facility] for the same complaint (though on last admission, he also had some left-sided weakness); his brother shared during the last admission that "blank outs" have been ongoing for the past 2-3 years. He has undergone EEG in 07/2014 and 09/2014, TTE, MRI/MRA, carotid dopplers and telemetry. It has been suggested that his seroquel be stopped, but  this medication remains on his home medication list.   Recurrent Syncope / AMS at ALF: CT head negative for acute findings. Ongoing occurrence over 2-3 years with no obvious cardiac etiology (TTE, telemetry, ACS r/o negative over past 3 hospitalizations), neuro (CT head this admission, MRI/MRA last admission, EEGx2 in 07/2014 and 09/2014 negative), orthostasis (negative this admission). Vasovagal would be difficult to elicit given the patient's dementia. This may be a medication side effect, particularly given that he was taking seroquel BID. In conversation with Dr. Amada Jupiter (neurology), suggests starting empiric anti-epileptic therapy in an elderly demented patient with this repeated presentation. - Start depakote 500 mg BID with level in 2 weeks - D/c seroquel, as this is contraindicated in the elderly; if this medication is necessary due  to behavior as outpatient, could consider minimal dose at night only (has not been needed here) - PT/ OT suggest SNF - Needs advancement to SNF; awaiting call back from SW  DMII: Last A1c 07/2014 13.0%. On home lantus. - SSI  Dementia: At baseline per family. He has never seen a neurologist as an outpatient, though neuropsychiatry evaluation has been recommended at prior hospitalizations.  - D/c seroquel - [place] frequently  CKD Stage IIIA: Baseline Cr ~1.50. Currently 1.15 (below baseline) with GFR 67.  HTN: Currently 180s/100s. On home HCTZ 25 mg daily and lisinopril 10 mg daily.  Diet: - Carb mod/HH  DVT Ppx:  - Heparin  Dispo: Disposition is deferred at this time, awaiting improvement of current medical problems.  Anticipated discharge in approximately 0 day(s).   The patient does have a current PCP Marletta Lor([name], NP) and does not [facility] after discharge.  The patient does have transportation limitations that hinder transportation to clinic appointments.  .Services Needed at time of discharge: Y = Yes, Blank  = No PT:   OT:   RN:   Equipment:   Other:     LOS: 2 days   Dionne AnoJulia Asa Fath, MD 10/18/2014, 10:14 AM   Addendum: Had code status discussion with patient's daughter, Todd [name]. She is the healthcare power of attorney (and his only child, wife is deceased). She and her father wish for him to be DNR. This discussion was also had with Dr. Shirlee LatchMcLean for confirmation.

## 2014-10-18 NOTE — Progress Notes (Signed)
Report called to Marchelle FolksAmanda, pt's nurse at [facility].  Verbalized understanding.

## 2014-10-19 LAB — GLUCOSE, CAPILLARY: Glucose-Capillary: 62 mg/dL — ABNORMAL LOW (ref 70–99)

## 2014-10-20 ENCOUNTER — Non-Acute Institutional Stay (SKILLED_NURSING_FACILITY): Payer: Medicare PPO | Admitting: Internal Medicine

## 2014-10-20 DIAGNOSIS — I951 Orthostatic hypotension: Secondary | ICD-10-CM

## 2014-10-20 DIAGNOSIS — E1142 Type 2 diabetes mellitus with diabetic polyneuropathy: Secondary | ICD-10-CM

## 2014-10-20 DIAGNOSIS — R55 Syncope and collapse: Secondary | ICD-10-CM

## 2014-10-20 LAB — GLUCOSE, CAPILLARY
GLUCOSE-CAPILLARY: 211 mg/dL — AB (ref 70–99)
GLUCOSE-CAPILLARY: 290 mg/dL — AB (ref 70–99)
GLUCOSE-CAPILLARY: 219 mg/dL — AB (ref 70–99)
GLUCOSE-CAPILLARY: 263 mg/dL — AB (ref 70–99)
Glucose-Capillary: 356 mg/dL — ABNORMAL HIGH (ref 70–99)
Glucose-Capillary: 264 mg/dL — ABNORMAL HIGH (ref 70–99)
Glucose-Capillary: 131 mg/dL — ABNORMAL HIGH (ref 70–99)
Glucose-Capillary: 259 mg/dL — ABNORMAL HIGH (ref 70–99)

## 2014-10-21 LAB — GLUCOSE, CAPILLARY
GLUCOSE-CAPILLARY: 306 mg/dL — AB (ref 70–99)
GLUCOSE-CAPILLARY: 330 mg/dL — AB (ref 70–99)
GLUCOSE-CAPILLARY: 377 mg/dL — AB (ref 70–99)
GLUCOSE-CAPILLARY: 311 mg/dL — AB (ref 70–99)
Glucose-Capillary: 194 mg/dL — ABNORMAL HIGH (ref 70–99)
Glucose-Capillary: 187 mg/dL — ABNORMAL HIGH (ref 70–99)
Glucose-Capillary: 343 mg/dL — ABNORMAL HIGH (ref 70–99)
Glucose-Capillary: 364 mg/dL — ABNORMAL HIGH (ref 70–99)
Glucose-Capillary: 377 mg/dL — ABNORMAL HIGH (ref 70–99)

## 2014-10-21 NOTE — Clinical Social Work Placement (Signed)
     Clinical Social Work Department CLINICAL SOCIAL WORK PLACEMENT NOTE 10/18/2014  Patient:  Todd FendtMCCALL,[name]  Account Number:  [id number] Admit date:  10/16/2014  Clinical Social Worker:  Lupita LeashNNA Ajax Schroll, LCSW  Date/time:  10/18/2014 10:00 AM  Clinical Social Work is seeking post-discharge placement for this patient at the following level of care:   SKILLED NURSING   (*CSW will update this form in Epic as items are completed)   10/18/2014  Patient/family provided with [facility] Department of Clinical Social Works list of facilities offering this level of care within the geographic area requested by the patient (or if unable, by the patients family).  10/18/2014  Patient/family informed of their freedom to choose among providers that offer the needed level of care, that participate in Medicare, Medicaid or managed care program needed by the patient, have an available bed and are willing to accept the patient.  10/18/2014  Patient/family informed of [facility] ownership interest in [facility], as well as of the fact that they are under no obligation to receive care at this facility.  PASARR submitted to EDS on 10/18/2014 PASARR number received on 10/18/2014  FL2 transmitted to all facilities in geographic area requested by pt/family on  10/18/2014 FL2 transmitted to all facilities within larger geographic area on   Patient informed that his/her managed care company has contracts with or will negotiate with  certain facilities, including the following:   [employer] Medicare-- NOT [employer]  *LOG needed as patient is medically stable for d/c.     Patient/family informed of bed offers received:  10/18/2014 Patient chooses bed at [facility] Physician recommends and patient chooses bed at    [facility] on  10/18/2014 Patient to be transferred to facility by [facility]  Sharin Mons(PTAR) Patient and family notified of transfer on  10/18/2014 Name of family member notified:  Dillard Cannonhristy Welborne- daughter  The following physician request were entered in Epic: Physician Request  Please prepare priority discharge summary and prescriptions.  Please sign FL2.    Additional Comments: 10/18/14  Ok per MD for d/c today to SNF. Bed offers given to patient and daughterVickie and [facility] Facility agreed to accept LOG pending obtaining [employer] auth.  Nursing notified to call report. CSW signing off.

## 2014-10-22 LAB — GLUCOSE, CAPILLARY
GLUCOSE-CAPILLARY: 279 mg/dL — AB (ref 70–99)
Glucose-Capillary: 150 mg/dL — ABNORMAL HIGH (ref 70–99)
Glucose-Capillary: 108 mg/dL — ABNORMAL HIGH (ref 70–99)
Glucose-Capillary: 327 mg/dL — ABNORMAL HIGH (ref 70–99)

## 2014-10-23 LAB — GLUCOSE, CAPILLARY
GLUCOSE-CAPILLARY: 207 mg/dL — AB (ref 70–99)
Glucose-Capillary: 215 mg/dL — ABNORMAL HIGH (ref 70–99)
Glucose-Capillary: 139 mg/dL — ABNORMAL HIGH (ref 70–99)
Glucose-Capillary: 77 mg/dL (ref 70–99)
Glucose-Capillary: 271 mg/dL — ABNORMAL HIGH (ref 70–99)

## 2014-10-24 LAB — GLUCOSE, CAPILLARY
GLUCOSE-CAPILLARY: 159 mg/dL — AB (ref 70–99)
GLUCOSE-CAPILLARY: 273 mg/dL — AB (ref 70–99)
Glucose-Capillary: 175 mg/dL — ABNORMAL HIGH (ref 70–99)
Glucose-Capillary: 209 mg/dL — ABNORMAL HIGH (ref 70–99)

## 2014-10-25 LAB — GLUCOSE, CAPILLARY
GLUCOSE-CAPILLARY: 80 mg/dL (ref 70–99)
Glucose-Capillary: 210 mg/dL — ABNORMAL HIGH (ref 70–99)
Glucose-Capillary: 252 mg/dL — ABNORMAL HIGH (ref 70–99)
Glucose-Capillary: 283 mg/dL — ABNORMAL HIGH (ref 70–99)

## 2014-10-25 NOTE — Progress Notes (Addendum)
Patient ID: Todd [name], male   DOB: December 08, 1931, 80 y.o.   MRN: [id number]               HISTORY & PHYSICAL  DATE:  10/20/2014     FACILITY: [facility]    LEVEL OF CARE:   SNF   CHIEF COMPLAINT:  Admission to SNF, post stay at [facility] from 10/16/2014 through 10/18/2014.     HISTORY OF PRESENT ILLNESS:  This patient was admitted after having been found slumped over at the meal table.  He apparently improved on the way to the hospital.  This is listed as "he had another episode of syncope".  Previous full cardiac work-ups have been completed for this problem, per the discharge summary.  His orthostatic checks were negative.    He apparently has been given Depakote empirically as an antiepileptic therapy.  Presumably, he has not been found to be having obvious seizures.    His last hemoglobin A1c was 12.3 and he is a known diabetic.    His blood pressure was found in hospital to be 180 systolic and his lisinopril was increased to 20.    Apparently, his previous work-up included an EEG on 08/17/2014 and 09/29/2014, a TTE, MRA/MRI, carotid dopplers, and telemetry.  He is still on Seroquel, which was thought to be contributing.    On this admission, a CT scan was negative.  Vasovagal issues were felt to be possible, but difficult to prove.  Seroquel was discontinued on this admission.    PAST MEDICAL HISTORY/PROBLEM LIST:                  Recurrent syncope.  Note that he had an admission from 08/18/2014 through 08/20/2014 for this issue, also 08/31/2014 through 09/01/2014 with altered mental status, 10/03/2014 through 10/05/2014 for ?TIA symptoms.  He has also presented with an unresponsive episode.    Hypertension.    Type 2 diabetes.  Poorly controlled with a hemoglobin A1c on 10/03/2014 of 12.3.  On home Lantus.    Dementia.    Chronic kidney disease with a baseline creatinine of 1.5.    Coronary artery disease.   Status post CABG.    Hyperlipidemia.    CURRENT  MEDICATIONS:  Discharge medications include:         ASA 325 q.d.    Lipitor 80 q.d.     Depakote 500 q.12.    Hydrochlorothiazide 25 q.d.    Lantus insulin 8 U in the morning and 10 U at bedtime.    Lisinopril 20 mg (recently increased in the hospital).    Vitamin B12, 1000 U daily.    Vitamin D3, 1000 U daily.    SOCIAL HISTORY:                    HOUSING:  He lives at [facility], according to the discharge summary.  This is usually an assisted living chain, however.   FUNCTIONAL STATUS:  His exact functional status is unclear.    CODE STATUS:  He does come with a DNR status.    FAMILY HISTORY:   Not currently available.    REVIEW OF SYSTEMS:   HEENT:  The patient denies headache.   CHEST/RESPIRATORY:  No cough.  No sputum.      CARDIAC:   No chest pain or palpitations.   GI:  No abdominal pain.    PHYSICAL EXAMINATION:   GENERAL APPEARANCE:  The patient is not in any  distress.  Conversational.  Probably on the severe side of dementia, but no evidence of delirium.   CHEST/RESPIRATORY:  Clear air entry bilaterally.   CARDIOVASCULAR:  CARDIAC:   Heart sounds are normal with no carotid bruits and no murmurs.   GASTROINTESTINAL:  ABDOMEN:   Soft, nontender.  No masses.   NEUROLOGICAL:    BALANCE/GAIT:  He is able to bring himself to a standing position.  Certainly not obviously orthostatic.  Some balance issues, but otherwise his gait is fairly nondescript.   MOTOR:  He has no pronator drift.   SENSATION/STRENGTH:  Strength is normal.   DEEP TENDON REFLEXES:  Slightly hyporeflexic, but both knee jerks are present.   PSYCHIATRIC:   MENTAL STATUS:   He is not orientated to the year, place, where he was living.  He does not remember being in the hospital.    ASSESSMENT/PLAN:                        Recurrent syncope.  He was noted to be orthostatic-negative in the hospital.  I will check that in proper fashion.  Sometimes, orthostatic symptoms in older people or  diabetics can be elicited in a postprandial state; something to keep an eye on.    Felt to have a recent TIA.  There is nothing lateralizing on his exam.    Poorly controlled type 2 diabetes with questionable neuropathy.    Severe dementia.    Hypertension.  Lisinopril recently increased.    Empirically treated with valproic acid for seizures.  I will check a level on him probably in two weeks' time.    I am not expecting to repeat any of the extensive list of investigations this man has had listed above.  I have reviewed the echocardiogram from October.  I will redo the orthostatic checks myself as this is rarely done properly.

## 2014-10-26 LAB — GLUCOSE, CAPILLARY
Glucose-Capillary: 130 mg/dL — ABNORMAL HIGH (ref 70–99)
Glucose-Capillary: 236 mg/dL — ABNORMAL HIGH (ref 70–99)
Glucose-Capillary: 222 mg/dL — ABNORMAL HIGH (ref 70–99)
Glucose-Capillary: 201 mg/dL — ABNORMAL HIGH (ref 70–99)

## 2014-10-27 ENCOUNTER — Non-Acute Institutional Stay (SKILLED_NURSING_FACILITY): Payer: Medicare PPO | Admitting: Internal Medicine

## 2014-10-27 ENCOUNTER — Encounter: Payer: Self-pay | Admitting: Internal Medicine

## 2014-10-27 DIAGNOSIS — R55 Syncope and collapse: Secondary | ICD-10-CM

## 2014-10-27 DIAGNOSIS — I10 Essential (primary) hypertension: Secondary | ICD-10-CM

## 2014-10-27 DIAGNOSIS — E876 Hypokalemia: Secondary | ICD-10-CM

## 2014-10-27 DIAGNOSIS — E118 Type 2 diabetes mellitus with unspecified complications: Secondary | ICD-10-CM

## 2014-10-27 LAB — GLUCOSE, CAPILLARY
GLUCOSE-CAPILLARY: 164 mg/dL — AB (ref 70–99)
GLUCOSE-CAPILLARY: 135 mg/dL — AB (ref 70–99)
GLUCOSE-CAPILLARY: 209 mg/dL — AB (ref 70–99)
Glucose-Capillary: 93 mg/dL (ref 70–99)

## 2014-10-27 NOTE — Progress Notes (Signed)
Patient ID: Todd [name], male   DOB: January 16, 1932, 80 y.o.   MRN: [id number]  :   \   :       This is an acute visit    FACILITY: [facility]    LEVEL OF CARE:   SNF   CHIEF COMPLAINT:  Acute visit follow-up hypokalemia-syncope.--Diabetes-dementia     HISTORY OF PRESENT ILLNESS:  This patient was admitted  To the hospital  after having been found slumped over at the meal table.  He apparently improved on the way to the hospital.  This is listed as "he had another episode of syncope".  Previous full cardiac work-ups have been completed for this problem, per the discharge summary.  His orthostatic checks were negative.    He apparently has been given Depakote empirically as an antiepileptic therapy.  Presumably, he has not been found to be having obvious seizures.    His last hemoglobin A1c was 12.3 and he is a known diabetic--blood sugars appear improved here in the morning run largely in the low mid 100s the lowest one I see is 80 and this is not common.  Later in the day some variability at noon range from 77-2 65-although 77 quite abnormal.  At 4p.m. blood sugars appear to be somewhat more elevated largely in the 200s range and this is the case as well at at bedtime.    His blood pressure was found in hospital to be 180 systolic and his lisinopril was increased to 20.  Blood pressures here appear to have moderated with systolics running largely from the 1:30-150s area-I got 150/80 essentially sitting and standing today    Apparently, his previous work-up included an EEG on 08/17/2014 and 09/29/2014, a TTE, MRA/MRI, carotid dopplers, and telemetry.      On this admission, a CT scan was negative.  Vasovagal issues were felt to be possible, but difficult to prove.  During his hospital admission the Seroquel was discontinued-.  Despite this his behaviors appear to be stable-there has not really been an issue during his stay here although apparently there have been issues at  his assisted living-this appears to have stabilized.  Routine labs done today remarkable for potassium of 2.9-he is on hydrochlorothiazide without potassium supplementation will need to supplement this       PAST MEDICAL HISTORY/PROBLEM LIST:                  Recurrent syncope.  Note that he had an admission from 08/18/2014 through 08/20/2014 for this issue, also 08/31/2014 through 09/01/2014 with altered mental status, 10/03/2014 through 10/05/2014 for ?TIA symptoms.  He has also presented with an unresponsive episode.    Hypertension.    Type 2 diabetes.  previously  Poorly controlled with a hemoglobin A1c on 10/03/2014 of 12.3.  On home Lantus.    Dementia.    Chronic kidney disease with a baseline creatinine of 1.5.    Coronary artery disease.   Status post CABG.    Hyperlipidemia.    CURRENT MEDICATIONS:  Discharge medications include:         ASA 325 q.d.    Lipitor 80 q.d.     Depakote 500 q.12.    Hydrochlorothiazide 25 q.d.    Lantus insulin 8 U in the morning and 10 U at bedtime.    Lisinopril 20 mg (recently increased in the hospital).    Vitamin B12, 1000 U daily.    Vitamin D3, 1000 U daily.  SOCIAL HISTORY:                    HOUSING:  Patient previously lived at assisted living [facility]-and apparently there are plans to return there if possible sometime in the near future.   CODE STATUS:  He does come with a DNR status.    FAMILY HISTORY:   Not currently available.    REVIEW OF SYSTEMS:--Limited secondary to dementia-[facility] nursing staff is stay here has been quite uneventful with no acute complaints-no syncopal episodes In general no complaints fever or chills.  Skin-no rashes or itching noted.  Head ears eyes nose mouth and throat-does not complain of any sore throat visual changes.  Cardiac no chest pain.  Respiratory-no cough or shortness of breath noted.  GI-apparently eats well does not complaining of abdominal discomfort nausea or  vomiting diarrhea or constipation.  Musculoskeletal-apparently joint pain has not been an issue either.  Neurologic again no syncopal episodes have been noted.  Psych he does have significant dementia but behaviors have been well-controlled he's been pleasant and cooperative-.   [name]    PHYSICAL EXAMINATION:  Temperature 98.2 pulse 84 respirations 20 blood pressure 150/80-this appears to be relatively stable sitting and standing-  GENERAL APPEARANCE:  The patient is not in any distress. He does speak but does not talk a lot  Probably on the severe side of dementia, but no evidence of delirium.  His skin is warm and dry.  Eyes pupils appear reactive light sclerae not a clear  CHEST/RESPIRATORY:  Clear air entry bilaterally--labored breathing.   CARDIOVASCULAR:  CARDIAC:   Heart sounds are normal with no carotid bruits and no murmurs were rate and rhythm.   GASTROINTESTINAL:  ABDOMEN:   Soft, nontender.  No masses. Positive bowel sounds  NEUROLOGICAL:    BALANCE/GAIT:  He is able to bring himself to a standing position.  Certainly not obviously orthostatic.  Some balance issues, but otherwise his gait is fairly nondescript--. .   SENSATION/STRENGTH:  Strength is normal.   Neurologic speech is clear I do not see any lateralizing findings    PSYCHIATRIC:   MENTAL STATUS:   He is not orientated to the year, place, where he was living. He does follow simple verbal commands  Labs.  10/27/2014.  Sodium 141 potassium 2.9 BUN 22 creatinine 1.11.  Depakote level 93.8.  10/16/2014.  WBC 10.1 hemoglobin 8.6 platelets 187.  Liver function tests within normal limits except albumin of 3.3.    .    ASSESSMENT/PLAN:                        Recurrent syncope.  He was noted to be orthostatic-negative in the hospital.--Orthostatics appear to be negative as well on exam today-will write an order to check orthostatics after eating daily with a log for review   .    Felt to have a recent  TIA.  There is nothing lateralizing on his exam.    Poorly controlled type 2 diabetes with questionable neuropathy.--I suspect his sugars have improved in a more controlled environment-continues on Lantus twice a day-    Severe dementia--this appears to be baseline again he is off Seroquel but appears to be doing well.    Hypertension.  Lisinopril recently increased--somewhat variable systolics have been somewhat hesitant to be real aggressive here with his history of syncope at this point will monitor--he also continues on hydrochlorothiazide.    Empirically treated with valproic acid for  seizures.  Recent valproic acid level was within normal limits-this will need to be rechecked before discharge    hypokalemia-will supplement this with 40 mEq of potassium twice a day today and 40 tomorrow-recheck a BMP tomorrow--as well as a magnesium level-- also will recheck a CBC since he appears to have some element of anemia and would like to check his plateletswhile on Depakote.  [id number]   .

## 2014-10-28 LAB — GLUCOSE, CAPILLARY
GLUCOSE-CAPILLARY: 91 mg/dL (ref 70–99)
GLUCOSE-CAPILLARY: 161 mg/dL — AB (ref 70–99)
Glucose-Capillary: 138 mg/dL — ABNORMAL HIGH (ref 70–99)
Glucose-Capillary: 166 mg/dL — ABNORMAL HIGH (ref 70–99)
Glucose-Capillary: 81 mg/dL (ref 70–99)

## 2014-10-29 LAB — GLUCOSE, CAPILLARY
GLUCOSE-CAPILLARY: 81 mg/dL (ref 70–99)
GLUCOSE-CAPILLARY: 131 mg/dL — AB (ref 70–99)
Glucose-Capillary: 166 mg/dL — ABNORMAL HIGH (ref 70–99)
Glucose-Capillary: 217 mg/dL — ABNORMAL HIGH (ref 70–99)
Glucose-Capillary: 306 mg/dL — ABNORMAL HIGH (ref 70–99)

## 2014-10-30 LAB — GLUCOSE, CAPILLARY
GLUCOSE-CAPILLARY: 179 mg/dL — AB (ref 70–99)
Glucose-Capillary: 115 mg/dL — ABNORMAL HIGH (ref 70–99)
Glucose-Capillary: 146 mg/dL — ABNORMAL HIGH (ref 70–99)

## 2014-10-31 LAB — GLUCOSE, CAPILLARY
GLUCOSE-CAPILLARY: 158 mg/dL — AB (ref 70–99)
GLUCOSE-CAPILLARY: 118 mg/dL — AB (ref 70–99)
Glucose-Capillary: 84 mg/dL (ref 70–99)
Glucose-Capillary: 181 mg/dL — ABNORMAL HIGH (ref 70–99)

## 2014-11-01 LAB — GLUCOSE, CAPILLARY
Glucose-Capillary: 213 mg/dL — ABNORMAL HIGH (ref 70–99)
Glucose-Capillary: 102 mg/dL — ABNORMAL HIGH (ref 70–99)

## 2014-11-02 LAB — GLUCOSE, CAPILLARY
GLUCOSE-CAPILLARY: 66 mg/dL — AB (ref 70–99)
Glucose-Capillary: 168 mg/dL — ABNORMAL HIGH (ref 70–99)
Glucose-Capillary: 205 mg/dL — ABNORMAL HIGH (ref 70–99)
Glucose-Capillary: 237 mg/dL — ABNORMAL HIGH (ref 70–99)
Glucose-Capillary: 99 mg/dL (ref 70–99)
Glucose-Capillary: 225 mg/dL — ABNORMAL HIGH (ref 70–99)

## 2014-11-03 LAB — GLUCOSE, CAPILLARY
GLUCOSE-CAPILLARY: 243 mg/dL — AB (ref 70–99)
GLUCOSE-CAPILLARY: 218 mg/dL — AB (ref 70–99)
GLUCOSE-CAPILLARY: 200 mg/dL — AB (ref 70–99)
Glucose-Capillary: 157 mg/dL — ABNORMAL HIGH (ref 70–99)
Glucose-Capillary: 199 mg/dL — ABNORMAL HIGH (ref 70–99)
Glucose-Capillary: 335 mg/dL — ABNORMAL HIGH (ref 70–99)

## 2014-11-04 LAB — GLUCOSE, CAPILLARY
GLUCOSE-CAPILLARY: 174 mg/dL — AB (ref 70–99)
Glucose-Capillary: 212 mg/dL — ABNORMAL HIGH (ref 70–99)

## 2014-11-05 LAB — GLUCOSE, CAPILLARY
GLUCOSE-CAPILLARY: 238 mg/dL — AB (ref 70–99)
Glucose-Capillary: 207 mg/dL — ABNORMAL HIGH (ref 70–99)
Glucose-Capillary: 195 mg/dL — ABNORMAL HIGH (ref 70–99)
Glucose-Capillary: 280 mg/dL — ABNORMAL HIGH (ref 70–99)
Glucose-Capillary: 376 mg/dL — ABNORMAL HIGH (ref 70–99)
Glucose-Capillary: 349 mg/dL — ABNORMAL HIGH (ref 70–99)
Glucose-Capillary: 325 mg/dL — ABNORMAL HIGH (ref 70–99)

## 2014-11-06 LAB — GLUCOSE, CAPILLARY
GLUCOSE-CAPILLARY: 221 mg/dL — AB (ref 70–99)
GLUCOSE-CAPILLARY: 169 mg/dL — AB (ref 70–99)
GLUCOSE-CAPILLARY: 227 mg/dL — AB (ref 70–99)
GLUCOSE-CAPILLARY: 295 mg/dL — AB (ref 70–99)
GLUCOSE-CAPILLARY: 382 mg/dL — AB (ref 70–99)
GLUCOSE-CAPILLARY: 388 mg/dL — AB (ref 70–99)
Glucose-Capillary: 362 mg/dL — ABNORMAL HIGH (ref 70–99)
Glucose-Capillary: 401 mg/dL — ABNORMAL HIGH (ref 70–99)

## 2014-11-07 LAB — GLUCOSE, CAPILLARY
GLUCOSE-CAPILLARY: 284 mg/dL — AB (ref 70–99)
GLUCOSE-CAPILLARY: 187 mg/dL — AB (ref 70–99)
Glucose-Capillary: 138 mg/dL — ABNORMAL HIGH (ref 70–99)

## 2014-11-08 LAB — GLUCOSE, CAPILLARY
GLUCOSE-CAPILLARY: 297 mg/dL — AB (ref 70–99)
GLUCOSE-CAPILLARY: 332 mg/dL — AB (ref 70–99)
Glucose-Capillary: 222 mg/dL — ABNORMAL HIGH (ref 70–99)
Glucose-Capillary: 331 mg/dL — ABNORMAL HIGH (ref 70–99)
Glucose-Capillary: 374 mg/dL — ABNORMAL HIGH (ref 70–99)
Glucose-Capillary: 314 mg/dL — ABNORMAL HIGH (ref 70–99)
Glucose-Capillary: 235 mg/dL — ABNORMAL HIGH (ref 70–99)
Glucose-Capillary: 207 mg/dL — ABNORMAL HIGH (ref 70–99)
Glucose-Capillary: 353 mg/dL — ABNORMAL HIGH (ref 70–99)

## 2014-11-09 LAB — GLUCOSE, CAPILLARY
GLUCOSE-CAPILLARY: 285 mg/dL — AB (ref 70–99)
Glucose-Capillary: 238 mg/dL — ABNORMAL HIGH (ref 70–99)
Glucose-Capillary: 88 mg/dL (ref 70–99)
Glucose-Capillary: 320 mg/dL — ABNORMAL HIGH (ref 70–99)
Glucose-Capillary: 353 mg/dL — ABNORMAL HIGH (ref 70–99)

## 2014-11-10 LAB — GLUCOSE, CAPILLARY
GLUCOSE-CAPILLARY: 278 mg/dL — AB (ref 70–99)
GLUCOSE-CAPILLARY: 229 mg/dL — AB (ref 70–99)
Glucose-Capillary: 305 mg/dL — ABNORMAL HIGH (ref 70–99)
Glucose-Capillary: 196 mg/dL — ABNORMAL HIGH (ref 70–99)

## 2014-11-11 LAB — GLUCOSE, CAPILLARY
GLUCOSE-CAPILLARY: 253 mg/dL — AB (ref 70–99)
GLUCOSE-CAPILLARY: 123 mg/dL — AB (ref 70–99)
Glucose-Capillary: 265 mg/dL — ABNORMAL HIGH (ref 70–99)
Glucose-Capillary: 181 mg/dL — ABNORMAL HIGH (ref 70–99)
Glucose-Capillary: 132 mg/dL — ABNORMAL HIGH (ref 70–99)
Glucose-Capillary: 200 mg/dL — ABNORMAL HIGH (ref 70–99)
Glucose-Capillary: 87 mg/dL (ref 70–99)
Glucose-Capillary: 283 mg/dL — ABNORMAL HIGH (ref 70–99)

## 2014-11-12 LAB — GLUCOSE, CAPILLARY
GLUCOSE-CAPILLARY: 228 mg/dL — AB (ref 70–99)
Glucose-Capillary: 144 mg/dL — ABNORMAL HIGH (ref 70–99)
Glucose-Capillary: 78 mg/dL (ref 70–99)

## 2014-11-13 LAB — GLUCOSE, CAPILLARY
Glucose-Capillary: 139 mg/dL — ABNORMAL HIGH (ref 70–99)
Glucose-Capillary: 183 mg/dL — ABNORMAL HIGH (ref 70–99)
Glucose-Capillary: 93 mg/dL (ref 70–99)
Glucose-Capillary: 114 mg/dL — ABNORMAL HIGH (ref 70–99)
Glucose-Capillary: 122 mg/dL — ABNORMAL HIGH (ref 70–99)

## 2014-11-14 LAB — GLUCOSE, CAPILLARY
GLUCOSE-CAPILLARY: 198 mg/dL — AB (ref 70–99)
GLUCOSE-CAPILLARY: 109 mg/dL — AB (ref 70–99)
GLUCOSE-CAPILLARY: 200 mg/dL — AB (ref 70–99)
Glucose-Capillary: 219 mg/dL — ABNORMAL HIGH (ref 70–99)
Glucose-Capillary: 60 mg/dL — ABNORMAL LOW (ref 70–99)
Glucose-Capillary: 76 mg/dL (ref 70–99)
Glucose-Capillary: 171 mg/dL — ABNORMAL HIGH (ref 70–99)

## 2014-11-15 LAB — GLUCOSE, CAPILLARY
GLUCOSE-CAPILLARY: 169 mg/dL — AB (ref 70–99)
GLUCOSE-CAPILLARY: 179 mg/dL — AB (ref 70–99)
GLUCOSE-CAPILLARY: 102 mg/dL — AB (ref 70–99)
GLUCOSE-CAPILLARY: 151 mg/dL — AB (ref 70–99)
Glucose-Capillary: 127 mg/dL — ABNORMAL HIGH (ref 70–99)
Glucose-Capillary: 149 mg/dL — ABNORMAL HIGH (ref 70–99)

## 2014-11-16 LAB — GLUCOSE, CAPILLARY
GLUCOSE-CAPILLARY: 216 mg/dL — AB (ref 70–99)
GLUCOSE-CAPILLARY: 225 mg/dL — AB (ref 70–99)
Glucose-Capillary: 137 mg/dL — ABNORMAL HIGH (ref 70–99)
Glucose-Capillary: 95 mg/dL (ref 70–99)
Glucose-Capillary: 223 mg/dL — ABNORMAL HIGH (ref 70–99)

## 2014-11-17 LAB — GLUCOSE, CAPILLARY
GLUCOSE-CAPILLARY: 206 mg/dL — AB (ref 70–99)
Glucose-Capillary: 186 mg/dL — ABNORMAL HIGH (ref 70–99)
Glucose-Capillary: 160 mg/dL — ABNORMAL HIGH (ref 70–99)
Glucose-Capillary: 125 mg/dL — ABNORMAL HIGH (ref 70–99)
Glucose-Capillary: 144 mg/dL — ABNORMAL HIGH (ref 70–99)

## 2014-11-18 LAB — GLUCOSE, CAPILLARY
Glucose-Capillary: 172 mg/dL — ABNORMAL HIGH (ref 70–99)
Glucose-Capillary: 200 mg/dL — ABNORMAL HIGH (ref 70–99)
Glucose-Capillary: 154 mg/dL — ABNORMAL HIGH (ref 70–99)
Glucose-Capillary: 207 mg/dL — ABNORMAL HIGH (ref 70–99)
Glucose-Capillary: 216 mg/dL — ABNORMAL HIGH (ref 70–99)

## 2014-11-19 LAB — GLUCOSE, CAPILLARY
GLUCOSE-CAPILLARY: 115 mg/dL — AB (ref 70–99)
GLUCOSE-CAPILLARY: 261 mg/dL — AB (ref 70–99)
Glucose-Capillary: 176 mg/dL — ABNORMAL HIGH (ref 70–99)
Glucose-Capillary: 160 mg/dL — ABNORMAL HIGH (ref 70–99)
Glucose-Capillary: 254 mg/dL — ABNORMAL HIGH (ref 70–99)

## 2014-11-20 LAB — GLUCOSE, CAPILLARY
GLUCOSE-CAPILLARY: 165 mg/dL — AB (ref 70–99)
GLUCOSE-CAPILLARY: 233 mg/dL — AB (ref 70–99)
GLUCOSE-CAPILLARY: 142 mg/dL — AB (ref 70–99)
GLUCOSE-CAPILLARY: 309 mg/dL — AB (ref 70–99)
Glucose-Capillary: 154 mg/dL — ABNORMAL HIGH (ref 70–99)

## 2014-11-21 LAB — GLUCOSE, CAPILLARY
GLUCOSE-CAPILLARY: 211 mg/dL — AB (ref 70–99)
Glucose-Capillary: 213 mg/dL — ABNORMAL HIGH (ref 70–99)
Glucose-Capillary: 162 mg/dL — ABNORMAL HIGH (ref 70–99)
Glucose-Capillary: 178 mg/dL — ABNORMAL HIGH (ref 70–99)
Glucose-Capillary: 173 mg/dL — ABNORMAL HIGH (ref 70–99)

## 2014-11-22 LAB — GLUCOSE, CAPILLARY
GLUCOSE-CAPILLARY: 190 mg/dL — AB (ref 70–99)
GLUCOSE-CAPILLARY: 178 mg/dL — AB (ref 70–99)
Glucose-Capillary: 161 mg/dL — ABNORMAL HIGH (ref 70–99)
Glucose-Capillary: 126 mg/dL — ABNORMAL HIGH (ref 70–99)
Glucose-Capillary: 173 mg/dL — ABNORMAL HIGH (ref 70–99)

## 2014-11-23 LAB — GLUCOSE, CAPILLARY
GLUCOSE-CAPILLARY: 58 mg/dL — AB (ref 70–99)
GLUCOSE-CAPILLARY: 53 mg/dL — AB (ref 70–99)
Glucose-Capillary: 115 mg/dL — ABNORMAL HIGH (ref 70–99)
Glucose-Capillary: 68 mg/dL — ABNORMAL LOW (ref 70–99)
Glucose-Capillary: 92 mg/dL (ref 70–99)
Glucose-Capillary: 195 mg/dL — ABNORMAL HIGH (ref 70–99)
Glucose-Capillary: 142 mg/dL — ABNORMAL HIGH (ref 70–99)
Glucose-Capillary: 172 mg/dL — ABNORMAL HIGH (ref 70–99)

## 2014-11-24 ENCOUNTER — Ambulatory Visit (HOSPITAL_COMMUNITY)
Admission: RE | Admit: 2014-11-24 | Discharge: 2014-11-24 | Disposition: A | Discharge status: Home / Self Care | Payer: Medicare PPO | Source: Ambulatory Visit | Attending: Internal Medicine | Admitting: Internal Medicine

## 2014-11-24 ENCOUNTER — Encounter: Payer: Self-pay | Admitting: Internal Medicine

## 2014-11-24 ENCOUNTER — Non-Acute Institutional Stay (SKILLED_NURSING_FACILITY): Payer: Medicare PPO | Admitting: Internal Medicine

## 2014-11-24 DIAGNOSIS — M7989 Other specified soft tissue disorders: Secondary | ICD-10-CM | POA: Diagnosis present

## 2014-11-24 DIAGNOSIS — I82412 Acute embolism and thrombosis of left femoral vein: Secondary | ICD-10-CM | POA: Insufficient documentation

## 2014-11-24 DIAGNOSIS — I82402 Acute embolism and thrombosis of unspecified deep veins of left lower extremity: Secondary | ICD-10-CM

## 2014-11-24 DIAGNOSIS — R609 Edema, unspecified: Secondary | ICD-10-CM | POA: Insufficient documentation

## 2014-11-24 DIAGNOSIS — F0390 Unspecified dementia without behavioral disturbance: Secondary | ICD-10-CM

## 2014-11-24 DIAGNOSIS — E876 Hypokalemia: Secondary | ICD-10-CM

## 2014-11-24 DIAGNOSIS — E118 Type 2 diabetes mellitus with unspecified complications: Secondary | ICD-10-CM

## 2014-11-24 DIAGNOSIS — D696 Thrombocytopenia, unspecified: Secondary | ICD-10-CM

## 2014-11-24 DIAGNOSIS — I10 Essential (primary) hypertension: Secondary | ICD-10-CM

## 2014-11-24 LAB — GLUCOSE, CAPILLARY
GLUCOSE-CAPILLARY: 175 mg/dL — AB (ref 70–99)
Glucose-Capillary: 100 mg/dL — ABNORMAL HIGH (ref 70–99)
Glucose-Capillary: 169 mg/dL — ABNORMAL HIGH (ref 70–99)

## 2014-11-24 NOTE — Progress Notes (Signed)
Patient ID: Todd [name], male   DOB: December 06, 1931, 81 y.o.   MRN: [id number]     FACILITY: [facility]    LEVEL OF CARE:   SNF  This is an acute-routine visit   CHIEF COMPLAINT:  Medical management of chronic medical conditions including weakness-altered mental status-diabetes type 2-hypertension-hypokalemia.  Acute visit secondary to left leg edema     HISTORY OF PRESENT ILLNESS:  This patient was admitted  To the hospital  after having been found slumped over at the meal table.  He apparently improved on the way to the hospital.  This is listed as "he had another episode of syncope".  Previous full cardiac work-ups have been completed for this problem, per the discharge summary.  His orthostatic checks were negative.    He apparently has been given Depakoteempirically as an antiepileptic therapy.  Presumably, he has not been found to be having obvious seizures.    Patient had an elevated hemoglobin A1c of over 12 in the hospital is thought to be secondary to poor prior control as an outpatient-these appear to be improved he is on Lantus twice a day morning sugars run generally in the lower mid 100s-this appears to be the case at noon as well with occasional reading above 200 but this is not frequent-later in the day readings average more in the mid 100s low 200 range-will update a hemoglobin A 1   .    His blood pressure was found in hospital to be 180 systolic and his lisinopril was increased to 20.  Blood pressures here appear to have moderated with systolics running largely from the 1:30-140s area--I see occasional systolics in the low 100s and diastolics around 150 but these are not consistent-I do not see consistent elevations  Apparently, his previous work-up included an EEG on 08/17/2014 and 09/29/2014, a TTE, MRA/MRI, carotid dopplers, and telemetry.      On this admission, a CT scan was negative.  Vasovagal issues were felt to be possible, but difficult to prove.   During his hospital admission the Seroquel was discontinued-.  Despite this his behaviors appear to be stable-there has not really been an issue during his stay here although apparently there have been issues at his assisted living-this appears to have stabilized.   He was found on admission to be hypokalemic with potassium at 2.9-this has been supplemented we will need to update his metabolic panel last potassium had shown normalization but this will need to be rechecked s  Most acute issue today is some increase left lg edema apparently does have some baseline left leg edema but has increased apparently today-she did not complaining of any pain with this-clinically denies any shortness of breath chest pain indicates he feels okay       PAST MEDICAL HISTORY/PROBLEM LIST:                  Recurrent syncope.  Note that he had an admission from 08/18/2014 through 08/20/2014 for this issue, also 08/31/2014 through 09/01/2014 with altered mental status, 10/03/2014 through 10/05/2014 for ?TIA symptoms.  He has also presented with an unresponsive episode.    Hypertension.    Type 2 diabetes.  previously  Poorly controlled with a hemoglobin A1c on 10/03/2014 of 12.3.  On home Lantus.    Dementia.    Chronic kidney disease with a baseline creatinine of 1.5.    Coronary artery disease.   Status post CABG.    Hyperlipidemia.    CURRENT MEDICATIONS:  Discharge medications include:         ASA 325 q.d.    Lipitor 80 q.d.     Depakote 500 q.12.    Hydrochlorothiazide 25 q.d.    Lantus insulin 8 U in the morning and 10 U at bedtime.    Lisinopril 20 mg (recently increased in the hospital).    Vitamin B12, 1000 U daily.    Vitamin D3, 1000 U daily.  Potassium 40 mEq a day    SOCIAL HISTORY:                    HOUSING:  Patient previously lived at assisted living [facility]-and apparently there are plans to return there if possible sometime in the near future.   CODE STATUS:  He does  come with a DNR status.    FAMILY HISTORY:   Not currently available.    REVIEW OF SYSTEMS:--Limited secondary to dementiaper nursing staff is stay here has been quite uneventful with no acute complaints In general no complaints fever or chills.  Skin-no rashes or itching noted.  Head ears eyes nose mouth and throat-does not complain of any sore throat visual changes.  Cardiac no chest  Pain-- increased left lower extremity edema--.  Respiratory-no cough or shortness of breath noted.  GI-apparently eats well does not complaining of abdominal discomfort nausea or vomiting diarrhea or constipation.  Musculoskeletal-apparently joint pain has not been an issue --is not really complaining of any pain in his left leg.  Neurologic again no syncopal episodes have been noted.  Psych he does have significant dementia but behaviors have been well-controlled he's been pleasant and cooperative-.   [name]    PHYSICAL EXAMINATION:   Capture 97.2 pulse 62 respirations 20 blood pressure 137/58 weight is been stable at 188 GENERAL APPEARANCE:  The patient is not in any distress. He does speak but does not talk a lot  Probably on the severe side of dementia, but no evidence of delirium.  His skin is warm and dry.  Eyes pupils appear reactive light sclerae not a clear  CHEST/RESPIRATORY:  Clear air entry bilaterally-no -labored breathing.   CARDIOVASCULAR:  CARDIAC:   Heart sounds are normal with no carotid bruits l1-2/6 systolic murmur  Regular  rate and rhythm.   GASTROINTESTINAL:  ABDOMEN:   Soft, nontender.  No masses. Positive bowel sounds   . [name] Musculoskeletal-moves all extremities 4 appears to have increased edema of his left leg I would say 2+ with reduced pedal pulse-- skin is somewhat shiny in appearance but no overt erythema warmth or tenderness to palpation here   SENSATION/STRENGTH:  Strength is normal.   Neurologic speech is clear I do not see any lateralizing findings      PSYCHIATRIC:   MENTAL STATUS:   He is not orientated to the year, place, where he was living. He does follow simple verbal commands  Labs.  11/01/2014.  Valproic acid level LXXXVIII.  10/28/2014.  WBC 4.4 hemoglobin 10.5 platelets 175.  Sodium 142 potassium 3.7 BUN 25 creatinine 1.18.  Magnesium level II.0.  Depakote level XCVI.5.    10/27/2014.  Sodium 141 potassium 2.9 BUN 22 creatinine 1.11.  Depakote level 93.8.  10/16/2014.  WBC 10.1 hemoglobin 8.6 platelets 187.  Liver function tests within normal limits except albumin of 3.3.    .    ASSESSMENT/PLAN:    Left lower leg edema-he has had some baseline slightly increased edema on the left he appears  had a vein harvest  there at some point-however this is fairly significant  increased today it is not painful does not appear to be infected will order a venous Doppler expediently however follow-up on this                      Recurrent syncope. This has not been an issue during his stay here appears to be stable      .    Felt to have a recent TIA.  There is nothing lateralizing on  exam.    Poorly controlled type 2 diabetes with questionable neuropathy.--I suspect his sugars have improved in a more controlled environment-continues on Lantus twice a day-we'll check a hemoglobin A1c    Severe dementia--this appears to be baseline again he is off Seroquel but appears to be doing well.    Hypertension.  Lisinopril recently increased--somewhat variable systolics have been somewhat hesitant to be real aggressive here with his history of syncope at this point will monitor--he also continues on hydrochlorothiazide--blood pressures generally seem stable.    Empirically treated with valproic acid for seizures.  Recent valproic acid level was within normal limits-   hypokalemia-this is being supplemented we will need to update a BMP.  Anemia-this appears actually to have improved per recent lab will update this as  well.   Addendum we have obtain updated blood work which shows a potassium of 5.1 sodium 137 BUN of 38 creatinine of 1.51.  WBC 4.3 hemoglobin 10.6 and platelets 105  We have also received initial reports from ultrasound-guided is positive for left leg DVT.--This is for a f nonocclusive thrombus along proximal distal femoral vein and within the left popliteal vein  will start Lovenox twice a day for anticoagulation bed rest 24 hours and start Coumadin 5 mg a day starting on Friday with daily PT/INRs until INR is between 2 and 3.  Regards to renal insufficiency creatinine has gone up somewhat-will decrease the dose of hydrochlorothiazide to 12.5 mg a day and reduce the potassium to 20 mEq a day-will update a basic metabolic panel on Friday.--Blood pressures will have to be monitored closely  In regards to thrombocytopenia I do note he is on Depakote will have to keep an eye on this as well will order another CBCtomorrow as well Monitor vital signs pulse ox every shift .         [name].  [id number]--of note greater than 35 minutes spent assessing patient-discussing his status with nursing staff-reviewing his chart-and coordinating and formulating a plan of care for numerous diagnoses-of note greater than 50% of time spent coordinating plan of care

## 2014-11-25 LAB — GLUCOSE, CAPILLARY
GLUCOSE-CAPILLARY: 249 mg/dL — AB (ref 70–99)
Glucose-Capillary: 177 mg/dL — ABNORMAL HIGH (ref 70–99)
Glucose-Capillary: 174 mg/dL — ABNORMAL HIGH (ref 70–99)
Glucose-Capillary: 128 mg/dL — ABNORMAL HIGH (ref 70–99)
Glucose-Capillary: 169 mg/dL — ABNORMAL HIGH (ref 70–99)
Glucose-Capillary: 128 mg/dL — ABNORMAL HIGH (ref 70–99)
Glucose-Capillary: 192 mg/dL — ABNORMAL HIGH (ref 70–99)

## 2014-11-26 ENCOUNTER — Non-Acute Institutional Stay (SKILLED_NURSING_FACILITY): Payer: Medicare PPO | Admitting: Internal Medicine

## 2014-11-26 DIAGNOSIS — D696 Thrombocytopenia, unspecified: Secondary | ICD-10-CM

## 2014-11-26 DIAGNOSIS — N289 Disorder of kidney and ureter, unspecified: Secondary | ICD-10-CM

## 2014-11-26 DIAGNOSIS — I82402 Acute embolism and thrombosis of unspecified deep veins of left lower extremity: Secondary | ICD-10-CM

## 2014-11-26 LAB — GLUCOSE, CAPILLARY
Glucose-Capillary: 142 mg/dL — ABNORMAL HIGH (ref 70–99)
Glucose-Capillary: 90 mg/dL (ref 70–99)
Glucose-Capillary: 126 mg/dL — ABNORMAL HIGH (ref 70–99)
Glucose-Capillary: 159 mg/dL — ABNORMAL HIGH (ref 70–99)

## 2014-11-26 NOTE — Progress Notes (Signed)
Patient ID: Todd FendtMilton [name], male   DOB: 09/01/1932, 81 y.o.   MRN: [id number]    FACILITY: [facility]    LEVEL OF CARE:   SNF  This is an acut visit   CHIEF COMPLAINT:  Acute visit follow-up left leg DVT with anticoagulation-also thrombocytopenia     HISTORY OF PRESENT ILLNESS:   Patient is a pleasant elderly male with severe for rehabilitation and monitoring-she had been hospitalized for altered mental status lethargy which apparently improved during this hospitalization he was empirically started apparently on Depakote for concerns he may be having seizures.  He was found recently to have increased edema of his left lower leg and  Doppler ultrasound was positive for DVT nonocclusive thrombus on the proximal and distal left femoral vein and within the left popliteal vein-initially was started on Lovenox-he is now been transferred to Xarelto.  Clinically this appears to be improved there is less edema of the leg is not complaining of pain.  This is complicated somewhat with a history of thrombocytopenia his platelets did go down to 108,000 here recently we have updated his labs and this appears actually improved with platelets of 120,000 as of today.  He's hemoglobin is stable at 10.5.  There's been no increased bruising or bleeding noted patient has no complaints she can be somewhat of a poor historian however secondary to dementia.     y       PAST MEDICAL HISTORY/PROBLEM LIST:                  Recurrent syncope.  Note that he had an admission from 08/18/2014 through 08/20/2014 for this issue, also 08/31/2014 through 09/01/2014 with altered mental status, 10/03/2014 through 10/05/2014 for ?TIA symptoms.  He has also presented with an unresponsive episode.    Hypertension.    Type 2 diabetes.  previously  Poorly controlled with a hemoglobin A1c on 10/03/2014 of 12.3.  On home Lantus.    Dementia.    Chronic kidney disease with a baseline creatinine of 1.5.     Coronary artery disease.   Status post CABG.    Hyperlipidemia.    CURRENT MEDICATIONS:  Discharge medications include:         ASA 325 q.d.    Lipitor 80 q.d.     Depakote 500 q.12.    Hydrochlorothiazide 25 q.d.    Lantus insulin 8 U in the morning and 10 U at bedtime.    Lisinopril 20 mg (recently increased in the hospital).    Vitamin B12, 1000 U daily.    Vitamin D3, 1000 U daily.  Potassium 40 mEq a day  Xarelto 15 mg by mouth twice a day    SOCIAL HISTORY:                    HOUSING:  Patient previously lived at assisted living [facility]-and apparently there are plans to return there if possible sometime in the near future.   CODE STATUS:  He does come with a DNR status.    FAMILY HISTORY:   Not currently available.    REVIEW OF SYSTEMS:--Limited secondary to dementiaper nursing staff is stay here has been quite uneventful with no acute complaints not complaining of  leg pain shortness of breath chest pain there has been no increased bruising or bleeding noted In general no complaints fever or chills.  Skin-no rashes or itching noted.  Head ears eyes nose mouth and throat-does not complain of any sore throat  visual changes.  Cardiac no chest  Pain-- increased left lower extremity edema--.  Respiratory-no cough or shortness of breath noted.  GI-apparently eats well does not complaining of abdominal discomfort nausea or vomiting diarrhea or constipation.  Musculoskeletal-apparently joint pain has not been an issue --is not really complaining of any pain in his left leg.  Neurologic again no syncopal episodes have been noted.  Psych he does have significant dementia but behaviors have been well-controlled he's been pleasant and cooperative-.   [name]    PHYSICAL EXAMINATION:   Temperature 98.3 pulse 72 respirations 19 blood pressure 141/80 GENERAL APPEARANCE:  The patient is not in any distress. He does speak but does not talk a lot  Probably on the severe  side of dementia, but no evidence of delirium.  His skin is warm and dry.--No increased bruising or bleeding noted  Eyes pupils appear reactive light sclerae   clear  CHEST/RESPIRATORY:  Clear air entry bilaterally-no -labored breathing.   CARDIOVASCULAR:  CARDIAC:   Heart sounds are normal with no carotid bruits l1-2/6 systolic murmur  Regular  rate and rhythm.   GASTROINTESTINAL:  ABDOMEN:   Soft, nontender.  No masses. Positive bowel sounds   . [name] Musculoskeletal-moves all extremities 4 --- the edema on the left leg is present yet but appears to be improved there is no significant  warmth or tenderness to palpation here  --pedal pulses somewhat reduced SENSATION/STRENGTH:  Strength is normal.   Neurologic speech is clear I do not see any lateralizing findings    PS   Labs.  Gen. 29 2015.  WBC 4.3 hemoglobin 10.5 platelets 120,000.  11/25/2013.  WBC 4.2 hemoglobin 10.3 platelets 108.  Valproic acid level LXXVI.5.  Sodium 139 potassium 4.5 BUN 32 creatinine 1.4  11/01/2014.  Valproic acid level LXXXVIII.  10/28/2014.  WBC 4.4 hemoglobin 10.5 platelets 175.  Sodium 142 potassium 3.7 BUN 25 creatinine 1.18.  Magnesium level II.0.  Depakote level XCVI.5.    10/27/2014.  Sodium 141 potassium 2.9 BUN 22 creatinine 1.11.  Depakote level 93.8.  10/16/2014.  WBC 10.1 hemoglobin 8.6 platelets 187.  Liver function tests within normal limits except albumin of 3.3.    .    ASSESSMENT/PLAN:     Left lower leg edema-with recent history of left leg DVT-again he is on anticoagulation with Xarelto-this appears to be somewhat improved clinically he appears stable.    Thrombocytopenia-platelets appear to have rebounded somewhat here but still lower than admission platelets-this will need to be monitored especially since he is on Depakote and anticoagulation-we will check another CBC on Monday, February 1-  Renal insufficiency-creatinine most recently 1.4 which  is slightly higher than his baseline-his hydrochlorothiazide has been reduced update BMP is pending  [id number]              -     .

## 2014-11-27 LAB — GLUCOSE, CAPILLARY
GLUCOSE-CAPILLARY: 128 mg/dL — AB (ref 70–99)
Glucose-Capillary: 182 mg/dL — ABNORMAL HIGH (ref 70–99)
Glucose-Capillary: 116 mg/dL — ABNORMAL HIGH (ref 70–99)
Glucose-Capillary: 175 mg/dL — ABNORMAL HIGH (ref 70–99)
Glucose-Capillary: 161 mg/dL — ABNORMAL HIGH (ref 70–99)

## 2014-11-28 DIAGNOSIS — I82409 Acute embolism and thrombosis of unspecified deep veins of unspecified lower extremity: Secondary | ICD-10-CM | POA: Insufficient documentation

## 2014-11-28 LAB — GLUCOSE, CAPILLARY
GLUCOSE-CAPILLARY: 183 mg/dL — AB (ref 70–99)
Glucose-Capillary: 166 mg/dL — ABNORMAL HIGH (ref 70–99)
Glucose-Capillary: 100 mg/dL — ABNORMAL HIGH (ref 70–99)
Glucose-Capillary: 69 mg/dL — ABNORMAL LOW (ref 70–99)
Glucose-Capillary: 65 mg/dL — ABNORMAL LOW (ref 70–99)

## 2014-11-29 DIAGNOSIS — D696 Thrombocytopenia, unspecified: Secondary | ICD-10-CM | POA: Insufficient documentation

## 2014-11-29 LAB — GLUCOSE, CAPILLARY
GLUCOSE-CAPILLARY: 160 mg/dL — AB (ref 70–99)
GLUCOSE-CAPILLARY: 129 mg/dL — AB (ref 70–99)
Glucose-Capillary: 94 mg/dL (ref 70–99)
Glucose-Capillary: 135 mg/dL — ABNORMAL HIGH (ref 70–99)
Glucose-Capillary: 181 mg/dL — ABNORMAL HIGH (ref 70–99)
Glucose-Capillary: 187 mg/dL — ABNORMAL HIGH (ref 70–99)

## 2014-11-30 LAB — GLUCOSE, CAPILLARY
GLUCOSE-CAPILLARY: 122 mg/dL — AB (ref 70–99)
GLUCOSE-CAPILLARY: 130 mg/dL — AB (ref 70–99)
GLUCOSE-CAPILLARY: 194 mg/dL — AB (ref 70–99)
GLUCOSE-CAPILLARY: 211 mg/dL — AB (ref 70–99)
Glucose-Capillary: 151 mg/dL — ABNORMAL HIGH (ref 70–99)

## 2014-12-01 LAB — GLUCOSE, CAPILLARY
GLUCOSE-CAPILLARY: 194 mg/dL — AB (ref 70–99)
Glucose-Capillary: 138 mg/dL — ABNORMAL HIGH (ref 70–99)
Glucose-Capillary: 118 mg/dL — ABNORMAL HIGH (ref 70–99)
Glucose-Capillary: 124 mg/dL — ABNORMAL HIGH (ref 70–99)
Glucose-Capillary: 167 mg/dL — ABNORMAL HIGH (ref 70–99)

## 2014-12-02 LAB — GLUCOSE, CAPILLARY
GLUCOSE-CAPILLARY: 129 mg/dL — AB (ref 70–99)
GLUCOSE-CAPILLARY: 103 mg/dL — AB (ref 70–99)
GLUCOSE-CAPILLARY: 169 mg/dL — AB (ref 70–99)
Glucose-Capillary: 114 mg/dL — ABNORMAL HIGH (ref 70–99)
Glucose-Capillary: 179 mg/dL — ABNORMAL HIGH (ref 70–99)

## 2014-12-03 LAB — GLUCOSE, CAPILLARY
GLUCOSE-CAPILLARY: 222 mg/dL — AB (ref 70–99)
Glucose-Capillary: 123 mg/dL — ABNORMAL HIGH (ref 70–99)
Glucose-Capillary: 96 mg/dL (ref 70–99)
Glucose-Capillary: 181 mg/dL — ABNORMAL HIGH (ref 70–99)

## 2014-12-04 LAB — GLUCOSE, CAPILLARY
GLUCOSE-CAPILLARY: 233 mg/dL — AB (ref 70–99)
GLUCOSE-CAPILLARY: 217 mg/dL — AB (ref 70–99)
GLUCOSE-CAPILLARY: 192 mg/dL — AB (ref 70–99)
GLUCOSE-CAPILLARY: 174 mg/dL — AB (ref 70–99)
Glucose-Capillary: 142 mg/dL — ABNORMAL HIGH (ref 70–99)
Glucose-Capillary: 229 mg/dL — ABNORMAL HIGH (ref 70–99)

## 2014-12-05 LAB — GLUCOSE, CAPILLARY
Glucose-Capillary: 234 mg/dL — ABNORMAL HIGH (ref 70–99)
Glucose-Capillary: 210 mg/dL — ABNORMAL HIGH (ref 70–99)
Glucose-Capillary: 167 mg/dL — ABNORMAL HIGH (ref 70–99)

## 2014-12-06 LAB — GLUCOSE, CAPILLARY
Glucose-Capillary: 172 mg/dL — ABNORMAL HIGH (ref 70–99)
Glucose-Capillary: 192 mg/dL — ABNORMAL HIGH (ref 70–99)
Glucose-Capillary: 167 mg/dL — ABNORMAL HIGH (ref 70–99)
Glucose-Capillary: 123 mg/dL — ABNORMAL HIGH (ref 70–99)

## 2014-12-07 LAB — GLUCOSE, CAPILLARY
GLUCOSE-CAPILLARY: 92 mg/dL (ref 70–99)
GLUCOSE-CAPILLARY: 218 mg/dL — AB (ref 70–99)
Glucose-Capillary: 237 mg/dL — ABNORMAL HIGH (ref 70–99)
Glucose-Capillary: 191 mg/dL — ABNORMAL HIGH (ref 70–99)

## 2014-12-08 LAB — GLUCOSE, CAPILLARY
GLUCOSE-CAPILLARY: 127 mg/dL — AB (ref 70–99)
GLUCOSE-CAPILLARY: 53 mg/dL — AB (ref 70–99)
GLUCOSE-CAPILLARY: 77 mg/dL (ref 70–99)
GLUCOSE-CAPILLARY: 90 mg/dL (ref 70–99)
GLUCOSE-CAPILLARY: 138 mg/dL — AB (ref 70–99)
Glucose-Capillary: 106 mg/dL — ABNORMAL HIGH (ref 70–99)
Glucose-Capillary: 159 mg/dL — ABNORMAL HIGH (ref 70–99)
Glucose-Capillary: 67 mg/dL — ABNORMAL LOW (ref 70–99)
Glucose-Capillary: 94 mg/dL (ref 70–99)
Glucose-Capillary: 89 mg/dL (ref 70–99)
Glucose-Capillary: 115 mg/dL — ABNORMAL HIGH (ref 70–99)

## 2014-12-09 LAB — GLUCOSE, CAPILLARY
GLUCOSE-CAPILLARY: 195 mg/dL — AB (ref 70–99)
Glucose-Capillary: 168 mg/dL — ABNORMAL HIGH (ref 70–99)
Glucose-Capillary: 153 mg/dL — ABNORMAL HIGH (ref 70–99)
Glucose-Capillary: 118 mg/dL — ABNORMAL HIGH (ref 70–99)
Glucose-Capillary: 171 mg/dL — ABNORMAL HIGH (ref 70–99)

## 2014-12-10 LAB — GLUCOSE, CAPILLARY
GLUCOSE-CAPILLARY: 149 mg/dL — AB (ref 70–99)
GLUCOSE-CAPILLARY: 180 mg/dL — AB (ref 70–99)
Glucose-Capillary: 148 mg/dL — ABNORMAL HIGH (ref 70–99)

## 2014-12-11 LAB — GLUCOSE, CAPILLARY
GLUCOSE-CAPILLARY: 146 mg/dL — AB (ref 70–99)
GLUCOSE-CAPILLARY: 211 mg/dL — AB (ref 70–99)
Glucose-Capillary: 263 mg/dL — ABNORMAL HIGH (ref 70–99)
Glucose-Capillary: 207 mg/dL — ABNORMAL HIGH (ref 70–99)
Glucose-Capillary: 113 mg/dL — ABNORMAL HIGH (ref 70–99)

## 2014-12-12 LAB — GLUCOSE, CAPILLARY
GLUCOSE-CAPILLARY: 140 mg/dL — AB (ref 70–99)
GLUCOSE-CAPILLARY: 184 mg/dL — AB (ref 70–99)
Glucose-Capillary: 234 mg/dL — ABNORMAL HIGH (ref 70–99)
Glucose-Capillary: 286 mg/dL — ABNORMAL HIGH (ref 70–99)
Glucose-Capillary: 98 mg/dL (ref 70–99)
Glucose-Capillary: 120 mg/dL — ABNORMAL HIGH (ref 70–99)
Glucose-Capillary: 155 mg/dL — ABNORMAL HIGH (ref 70–99)

## 2014-12-13 LAB — GLUCOSE, CAPILLARY
Glucose-Capillary: 236 mg/dL — ABNORMAL HIGH (ref 70–99)
Glucose-Capillary: 206 mg/dL — ABNORMAL HIGH (ref 70–99)

## 2014-12-14 LAB — GLUCOSE, CAPILLARY
GLUCOSE-CAPILLARY: 189 mg/dL — AB (ref 70–99)
GLUCOSE-CAPILLARY: 177 mg/dL — AB (ref 70–99)
GLUCOSE-CAPILLARY: 169 mg/dL — AB (ref 70–99)
Glucose-Capillary: 258 mg/dL — ABNORMAL HIGH (ref 70–99)
Glucose-Capillary: 289 mg/dL — ABNORMAL HIGH (ref 70–99)
Glucose-Capillary: 188 mg/dL — ABNORMAL HIGH (ref 70–99)
Glucose-Capillary: 164 mg/dL — ABNORMAL HIGH (ref 70–99)
Glucose-Capillary: 201 mg/dL — ABNORMAL HIGH (ref 70–99)

## 2014-12-15 LAB — GLUCOSE, CAPILLARY
GLUCOSE-CAPILLARY: 123 mg/dL — AB (ref 70–99)
GLUCOSE-CAPILLARY: 199 mg/dL — AB (ref 70–99)
Glucose-Capillary: 83 mg/dL (ref 70–99)
Glucose-Capillary: 72 mg/dL (ref 70–99)
Glucose-Capillary: 209 mg/dL — ABNORMAL HIGH (ref 70–99)

## 2014-12-16 LAB — GLUCOSE, CAPILLARY
GLUCOSE-CAPILLARY: 193 mg/dL — AB (ref 70–99)
Glucose-Capillary: 97 mg/dL (ref 70–99)
Glucose-Capillary: 129 mg/dL — ABNORMAL HIGH (ref 70–99)
Glucose-Capillary: 153 mg/dL — ABNORMAL HIGH (ref 70–99)
Glucose-Capillary: 257 mg/dL — ABNORMAL HIGH (ref 70–99)

## 2014-12-17 LAB — GLUCOSE, CAPILLARY
GLUCOSE-CAPILLARY: 158 mg/dL — AB (ref 70–99)
GLUCOSE-CAPILLARY: 145 mg/dL — AB (ref 70–99)
Glucose-Capillary: 205 mg/dL — ABNORMAL HIGH (ref 70–99)
Glucose-Capillary: 154 mg/dL — ABNORMAL HIGH (ref 70–99)
Glucose-Capillary: 268 mg/dL — ABNORMAL HIGH (ref 70–99)

## 2014-12-18 LAB — GLUCOSE, CAPILLARY
Glucose-Capillary: 168 mg/dL — ABNORMAL HIGH (ref 70–99)
Glucose-Capillary: 129 mg/dL — ABNORMAL HIGH (ref 70–99)

## 2014-12-19 LAB — GLUCOSE, CAPILLARY
GLUCOSE-CAPILLARY: 206 mg/dL — AB (ref 70–99)
GLUCOSE-CAPILLARY: 148 mg/dL — AB (ref 70–99)
GLUCOSE-CAPILLARY: 84 mg/dL (ref 70–99)
Glucose-Capillary: 75 mg/dL (ref 70–99)
Glucose-Capillary: 242 mg/dL — ABNORMAL HIGH (ref 70–99)
Glucose-Capillary: 99 mg/dL (ref 70–99)

## 2014-12-20 LAB — GLUCOSE, CAPILLARY
GLUCOSE-CAPILLARY: 144 mg/dL — AB (ref 70–99)
GLUCOSE-CAPILLARY: 102 mg/dL — AB (ref 70–99)
Glucose-Capillary: 223 mg/dL — ABNORMAL HIGH (ref 70–99)
Glucose-Capillary: 140 mg/dL — ABNORMAL HIGH (ref 70–99)
Glucose-Capillary: 86 mg/dL (ref 70–99)
Glucose-Capillary: 152 mg/dL — ABNORMAL HIGH (ref 70–99)
Glucose-Capillary: 215 mg/dL — ABNORMAL HIGH (ref 70–99)

## 2014-12-21 LAB — GLUCOSE, CAPILLARY
GLUCOSE-CAPILLARY: 142 mg/dL — AB (ref 70–99)
Glucose-Capillary: 99 mg/dL (ref 70–99)
Glucose-Capillary: 124 mg/dL — ABNORMAL HIGH (ref 70–99)
Glucose-Capillary: 214 mg/dL — ABNORMAL HIGH (ref 70–99)
Glucose-Capillary: 202 mg/dL — ABNORMAL HIGH (ref 70–99)

## 2014-12-22 ENCOUNTER — Non-Acute Institutional Stay (SKILLED_NURSING_FACILITY): Payer: Medicare PPO | Admitting: Internal Medicine

## 2014-12-22 DIAGNOSIS — I10 Essential (primary) hypertension: Secondary | ICD-10-CM

## 2014-12-22 DIAGNOSIS — D696 Thrombocytopenia, unspecified: Secondary | ICD-10-CM

## 2014-12-22 DIAGNOSIS — N189 Chronic kidney disease, unspecified: Secondary | ICD-10-CM

## 2014-12-22 DIAGNOSIS — I82402 Acute embolism and thrombosis of unspecified deep veins of left lower extremity: Secondary | ICD-10-CM

## 2014-12-22 DIAGNOSIS — E1122 Type 2 diabetes mellitus with diabetic chronic kidney disease: Secondary | ICD-10-CM

## 2014-12-22 LAB — GLUCOSE, CAPILLARY: Glucose-Capillary: 132 mg/dL — ABNORMAL HIGH (ref 70–99)

## 2014-12-22 NOTE — Progress Notes (Signed)
Patient ID: Todd [name], male   DOB: 02/06/1932, 81 y.o.   MRN: [id number]   ACILITY: [facility]    LEVEL OF CARE:   SNF  This is a routine visit   CHIEF COMPLAINT:  Medical management of chronic issues including weakness dementia hypertension history renal insufficiency-recent diagnosis of left lower leg DVT     HISTORY OF PRESENT ILLNESS:   Patient is a pleasant elderly male with weakness  here for rehabilitation and monitoring--he had been hospitalized for altered mental status lethargy which apparently improved during this hospitalization he was empirically started apparently on Depakote for concerns he may be having seizures.  He was found recently to have increased edema of his left lower leg and  Doppler ultrasound was positive for DVT nonocclusive thrombus on the proximal and distal left femoral vein and within the left popliteal vein-initially was started on Lovenox-he is now been transferred to Xarelto.  Clinically this appears to be improved there is still some residual edema of the left leg but this does not appear to be uncomfortable  This is complicated somewhat with a history of thrombocytopenia his platelets did go down to 108,000 here recently we have updated his labs and this appears actually improved with platelets of 82,000 as of February 1.  He's hemoglobin is stable to somewhat improved at 11.7.  There's been no increased bruising or bleeding noted patient has no complaints she can be somewhat of a poor historian however secondary to dementia  His other medical issues appear to be relatively stable he does have a history of hypertension he is on hydrochlorothiazide 12.5 this was reduced from 25 secondary to renal issues-I do note at times some elevated systolics 155/82-141/79 of note he is also on lisinopril 20 mg a day.   He also has a history of diabetes type 2 he is on twice a day dosing of Lantus 8 units in the morning 10 units at night morning sugars  appear to run largely in the 80s to mid 100s I do not see consistent lows or high-at noon sugars run from the 80s generally to mid 100s although I do note recently noon readings of 7584 and 86.  At 4 PM sugars appear to be more in the mid 100s low 200s range.  And at at bedtime run more in the 200s range  Today patient has no complaints he is a poor historian secondary to dementia but nursing staff reports no new issues he has been quite stable recently  y       PAST MEDICAL HISTORY/PROBLEM LIST:                  Recurrent syncope.  Note that he had an admission from 08/18/2014 through 08/20/2014 for this issue, also 08/31/2014 through 09/01/2014 with altered mental status, 10/03/2014 through 10/05/2014 for ?TIA symptoms.  He has also presented with an unresponsive episode.    Hypertension.    Type 2 diabetes.  previously  Poorly controlled with a hemoglobin A1c on 10/03/2014 of 12.3.  On home Lantus.    Dementia.    Chronic kidney disease with a baseline creatinine of 1.5.    Coronary artery disease.   Status post CABG.    Hyperlipidemia.    CURRENT MEDICATIONS:  Discharge medications include:       .    Lipitor 80 q.d.     Depakote 500 q.12.    Hydrochlorothiazide 12.5 q.d.    Lantus insulin 8 U in  the morning and 10 U at bedtime.    Lisinopril 20 mg (recently increased in the hospital).    Vitamin B12, 1000 U daily.    Vitamin D3, 1000 U daily.  Potassium 40 mEq a day  Xarelto milligrams daily    SOCIAL HISTORY:                    HOUSING:  Patient previously lived at assisted living [facility]-and apparently there are plans to return there if possible sometime in the near future.   CODE STATUS:  He does come with a DNR status.    FAMILY HISTORY:   Not currently available.    REVIEW OF SYSTEMS:--Limited secondary to dementiaper nursing staff is stay here has been quite uneventful with no acute complaints not complaining of  leg pain shortness of breath chest  pain there has been no increased bruising or bleeding noted In general no complaints fever or chills.  Skin-no rashes or itching noted.  Head ears eyes nose mouth and throat-does not complain of any sore throat visual changes.  Cardiac no chest  Pain-- mild left lower extremity edema--.  Respiratory-no cough or shortness of breath noted.  GI-apparently eats well does not complaining of abdominal discomfort nausea or vomiting diarrhea or constipation.  Musculoskeletal-apparently joint pain has not been an issue --is not really complaining of any pain in his left leg.  Neurologic again no syncopal episodes have been noted.  Psych he does have significant dementia but behaviors have been well-controlled he's been pleasant and cooperative-.   [name].    PHYSICAL EXAMINATION:   Is afebrile pulses 70 respirations 20 blood pressure taken manually 178/80--weight appears to be stable in the mid 180s GENERAL APPEARANCE:  The patient is not in any distress. He does speak but does not talk a lot  Probably on the severe side of dementia, but no evidence of delirium.  His skin is warm and dry.--No increased bruising or bleeding noted  Eyes pupils appear reactive light sclerae   clear  CHEST/RESPIRATORY:  Clear air entry bilaterally-no -labored breathing.   CARDIOVASCULAR:  CARDIAC:   Heart sounds are normal with no carotid bruits l1-2/6 systolic murmur  Regular  rate and rhythm.   GASTROINTESTINAL:  ABDOMEN:   Soft, nontender.  No masses. Positive bowel sounds   . [name]. Musculoskeletal-moves all extremities 4 --- the edema on the left leg is present yet but appears to be improved there is no significant  warmth or tenderness to palpation here  --pedal pulses somewhat reduced SENSATION/STRENGTH:  Strength is normal--he is ambulatory moves all extremities at baseline I did not note any deformities.   Neurologic speech is clear I do not see any lateralizing findings  Psych He is oriented to self will  follow simple verbal commands has been pleasant and cooperative during his stay here       Labs.  11/29/2014.  WBC 4.2 hemoglobin 11.7 platelets 182  Nov 26 2013.  WBC 4.3 hemoglobin 10.5 platelets 120,000.  11/25/2013.  WBC 4.2 hemoglobin 10.3 platelets 108.  Valproic acid level LXXVI.5.  Sodium 139 potassium 4.5 BUN 32 creatinine 1.4  11/01/2014.  Valproic acid level LXXXVIII.  10/28/2014.  WBC 4.4 hemoglobin 10.5 platelets 175.  Sodium 142 potassium 3.7 BUN 25 creatinine 1.18.  Magnesium level II.0.  Depakote level XCVI.5.    10/27/2014.  Sodium 141 potassium 2.9 BUN 22 creatinine 1.11.  Depakote level 93.8.  10/16/2014.  WBC 10.1 hemoglobin 8.6 platelets 187.  Liver  function tests within normal limits except albumin of 3.3.    .    ASSESSMENT/PLAN:     Left lower leg edema-with recent history of left leg DVT-again he is on anticoagulation with Xarelto-this appears to be  improved clinically he appears stable.                                                      Thrombocytopenia-platelets appear to have rebounded-will update lab  Renal insufficiency-creatinine most recently 1.4--it appears to be relatively baseline Will update this.  Diabetes type 2-has some variability of blood sugars will update a hemoglobin A1c also encourage good by mouth intake at breakfast I note occasionally there are sugars in the 70s and 80s at noon --we will reduce at bedtime Lantus to 8 units-again lower sugars at noon did not appear consistent this will have to be monitored-C-BG this morning was 93 at noon 114.  Yesterday morning was 99 at noon 124 4 PM t214 at at bedtime 202  Will slightly reduced Lantus although generally sugars did not appear to be  Unsatisfactory--will see with a hemoglobin A1c tells [place]  Hypertension-again hydrochlorothiazide was recently decreased secondary to renal issues he is also on lisinopril 20 mg a day I took it manually today and got  178/80-will increase his lisinopril to 30 mg a day and monitor this  Question seizure disorder-he is on Depakote will update a level there've been no seizures during his stay here to my knowledge  Hyperlipidemia-he continues on a statin will update liver function tests and lipid panel     [id number]-of note greater than 35 minutes spent assessing patient-discussing his status with nursing staff-reviewing his chart-and coordinating and formulating a plan of care for numerous diagnoses-of note greater than 50% of time spent coordinating plan of care              -    are .

## 2014-12-23 ENCOUNTER — Encounter (HOSPITAL_COMMUNITY)
Admission: RE | Admit: 2014-12-23 | Discharge: 2014-12-23 | Disposition: A | Discharge status: Home / Self Care | Payer: Medicare PPO | Source: Skilled Nursing Facility | Attending: Internal Medicine | Admitting: Internal Medicine

## 2014-12-23 LAB — COMPREHENSIVE METABOLIC PANEL
ALBUMIN: 3.1 g/dL — AB (ref 3.5–5.2)
ALK PHOS: 46 U/L (ref 39–117)
ALT: 47 U/L (ref 0–53)
AST: 35 U/L (ref 0–37)
BUN: 28 mg/dL — AB (ref 6–23)
CALCIUM: 8.4 mg/dL (ref 8.4–10.5)
CO2: 31 mmol/L (ref 19–32)
CREATININE: 1.19 mg/dL (ref 0.50–1.35)
Chloride: 105 mmol/L (ref 96–112)
GFR calc non Af Amer: 55 mL/min — ABNORMAL LOW (ref >90)
GFR, EST AFRICAN AMERICAN: 64 mL/min — AB (ref >90)
GLUCOSE: 164 mg/dL — AB (ref 70–99)
POTASSIUM: 4.3 mmol/L (ref 3.5–5.1)
Sodium: 138 mmol/L (ref 135–145)
Total Bilirubin: 0.5 mg/dL (ref 0.3–1.2)
Total Protein: 6.3 g/dL (ref 6.0–8.3)

## 2014-12-23 LAB — LIPID PANEL
CHOL/HDL RATIO: 4.3 ratio
Cholesterol: 189 mg/dL (ref 0–200)
HDL: 44 mg/dL (ref >39)
LDL Cholesterol: 125 mg/dL — ABNORMAL HIGH (ref 0–99)
Triglycerides: 101 mg/dL (ref <150)
VLDL: 20 mg/dL (ref 0–40)

## 2014-12-23 LAB — CBC WITH DIFFERENTIAL/PLATELET
BASOS PCT: 0 % (ref 0–1)
Basophils Absolute: 0 10*3/uL (ref 0.0–0.1)
EOS ABS: 0.2 10*3/uL (ref 0.0–0.7)
EOS PCT: 5 % (ref 0–5)
HCT: 33 % — ABNORMAL LOW (ref 39.0–52.0)
HEMOGLOBIN: 10.9 g/dL — AB (ref 13.0–17.0)
LYMPHS ABS: 1.6 10*3/uL (ref 0.7–4.0)
Lymphocytes Relative: 35 % (ref 12–46)
MCH: 31.6 pg (ref 26.0–34.0)
MCHC: 33 g/dL (ref 30.0–36.0)
MCV: 95.7 fL (ref 78.0–100.0)
MONO ABS: 0.6 10*3/uL (ref 0.1–1.0)
Monocytes Relative: 13 % — ABNORMAL HIGH (ref 3–12)
NEUTROS ABS: 2.1 10*3/uL (ref 1.7–7.7)
NEUTROS PCT: 47 % (ref 43–77)
Platelets: 162 10*3/uL (ref 150–400)
RBC: 3.45 MIL/uL — ABNORMAL LOW (ref 4.22–5.81)
RDW: 15.7 % — AB (ref 11.5–15.5)
WBC: 4.5 10*3/uL (ref 4.0–10.5)

## 2014-12-23 LAB — VALPROIC ACID LEVEL: VALPROIC ACID LVL: 67.6 ug/mL (ref 50.0–100.0)

## 2014-12-24 LAB — HEMOGLOBIN A1C
Hgb A1c MFr Bld: 9.2 % — ABNORMAL HIGH (ref 4.8–5.6)
Mean Plasma Glucose: 217 mg/dL

## 2015-01-02 ENCOUNTER — Encounter (HOSPITAL_COMMUNITY)
Admission: RE | Admit: 2015-01-02 | Payer: Medicare PPO | Source: Skilled Nursing Facility | Attending: Internal Medicine | Admitting: Internal Medicine

## 2015-03-09 ENCOUNTER — Non-Acute Institutional Stay: Payer: Medicare PPO | Admitting: Internal Medicine

## 2015-03-09 ENCOUNTER — Encounter: Payer: Self-pay | Admitting: Internal Medicine

## 2015-03-09 DIAGNOSIS — F0391 Unspecified dementia with behavioral disturbance: Secondary | ICD-10-CM | POA: Diagnosis not present

## 2015-03-09 DIAGNOSIS — N189 Chronic kidney disease, unspecified: Secondary | ICD-10-CM | POA: Diagnosis not present

## 2015-03-09 DIAGNOSIS — I10 Essential (primary) hypertension: Secondary | ICD-10-CM

## 2015-03-09 DIAGNOSIS — I82409 Acute embolism and thrombosis of unspecified deep veins of unspecified lower extremity: Secondary | ICD-10-CM | POA: Diagnosis not present

## 2015-03-09 DIAGNOSIS — E119 Type 2 diabetes mellitus without complications: Secondary | ICD-10-CM | POA: Diagnosis not present

## 2015-03-09 DIAGNOSIS — F03918 Unspecified dementia, unspecified severity, with other behavioral disturbance: Secondary | ICD-10-CM

## 2015-03-09 NOTE — Progress Notes (Signed)
Patient ID: Todd [name], male   DOB: 1932/08/03, 81 y.o.   MRN: [id number]     ACILITY: [facility]    LEVEL OF CARE:   SNF  This is a routine visit   CHIEF COMPLAINT:  Medical management of chronic issues including weakness dementia hypertension history renal insufficiency- left lower leg DVT     HISTORY OF PRESENT ILLNESS:   Patient is a pleasant elderly male with weakness  here for rehabilitation and monitoring--he had been hospitalized for altered mental status lethargy which apparently improved during  hospitalization he was empirically started apparently on Depakote for concerns he may be having seizures. After his discharge here He was found recently to have increased edema of his left lower leg and  Doppler ultrasound was positive for DVT nonocclusive thrombus on the proximal and distal left femoral vein and within the left popliteal vein-initially was started on Lovenox-he has now been transferred to Xarelto.  Clinically this appears to be improved there is still some residual edema of the left leg but this does not appear to be uncomfortable  This is complicated somewhat with a history of thrombocytopenia his platelets did go down to 108,000 here recently we have updated his labs and this appears actually improved with platelets of 82,000 as of February first and actually was stable at 162,000 on February 25.  He's hemoglobin is been stable most recently 10.9 on February 25.  There's been no increased bruising or bleeding noted patient has no complaints he can be somewhat of a poor historian however secondary to dementia  His other medical issues appear to be relatively stable he does have a history of hypertension he is on hydrochlorothiazide 12.5 this was reduced from 25 secondary to renal issues-I got 110/62 manually today I see listed 136/46-149/60 previously.   He also has a history of diabetes type 2 he is on twice a day dosing of Lantus 8 units in the morning 8  units at night-- Blood sugars run 90s low 100s generally in the morning the lowest one that I see is 73 and that is quite unusual.  At noon and 4 PM blood sugars average mid 100s ranging from the lower 100s to 200 range.  At night runs a little bit higher I would say higher 100s to 200 range   Today patient has no complaints he is a poor historian secondary to dementia but nursing staff reports no new issues he has been quite stable recently  y       PAST MEDICAL HISTORY/PROBLEM LIST:                  Recurrent syncope.  Note that he had an admission from 08/18/2014 through 08/20/2014 for this issue, also 08/31/2014 through 09/01/2014 with altered mental status, 10/03/2014 through 10/05/2014 for ?TIA symptoms.  He has also presented with an unresponsive episode.    Hypertension.    Type 2 diabetes.  previously  Poorly controlled with a hemoglobin A1c on 10/03/2014 of 12.3.  On home Lantus.    Dementia.    Chronic kidney disease with a baseline creatinine of 1.5.    Coronary artery disease.   Status post CABG.    Hyperlipidemia.    CURRENT MEDICATIONS:  Discharge medications include:       .    Lipitor 80 q.d.     Depakote 500 q.12.    Hydrochlorothiazide 12.5 q.d.    Lantus insulin 8 U in the morning and 10 U  at bedtime.    Lisinopril 30 mg .    Vitamin B12, 1000 U daily.    Vitamin D3, 1000 U daily.  Potassium 20 mEq a day  Xarelto  20 milligrams daily    SOCIAL HISTORY:                    HOUSING:  Patient previously lived at assisted living [facility]-and apparently there are plans to return there if possible sometime in the near future.   CODE STATUS:  He does come with a DNR status.    FAMILY HISTORY:   Not currently available.    REVIEW OF SYSTEMS:--Limited secondary to dementiaper nursing staff is stay here has been quite uneventful with no acute complaints not complaining of  leg pain shortness of breath chest pain there has been no increased bruising  or bleeding noted In general no complaints fever or chills.  Skin-no rashes or itching noted.  Head ears eyes nose mouth and throat-does not complain of any sore throat visual changes.  Cardiac no chest  Pain-- mild left lower extremity edema-although this is fairly minimal compared to the right-.  Respiratory-no cough or shortness of breath noted.  GI-apparently eats well does not complaining of abdominal discomfort nausea or vomiting diarrhea or constipation.  Musculoskeletal-apparently joint pain has not been an issue --is not really complaining of any pain in his left leg.  Neurologic again no syncopal episodes have been noted.  Psych he does have significant dementia but behaviors have been well-controlled he's been pleasant and cooperative-.   [name]    PHYSICAL EXAMINATION:    Temperature 98.2 pulse 60 respirations 20 blood pressure taken manually 110/62 --weight is 186 this appears to be stable GENERAL APPEARANCE:  The patient is not in any distress. He does speak but does not talk a lot  Probably on the severe side of dementia, but no evidence of delirium.  His skin is warm and dry.--No increased bruising or bleeding noted  Eyes pupils appear reactive light sclerae   clear --visual acuity appears grossly intact CHEST/RESPIRATORY:  Clear air entry bilaterally-no -labored breathing.   CARDIOVASCULAR:  CARDIAC:   Heart sounds are normal with no carotid bruits l1-2/6 systolic murmur  Regular  rate and rhythm.--Has some baseline lower extremity edema bit more in the left versus the right--pedal pulses intact   GASTROINTESTINAL:  ABDOMEN:   Soft, nontender.  No masses. Positive bowel sounds   . [name] Musculoskeletal-moves all extremities 4 --- the edema on the left leg is present yet but appears to be improved there is no significant  warmth or tenderness to palpation here  --pedal pulses somewhat reduced SENSATION/STRENGTH:  Strength is normal--he is ambulatory moves all extremities  at baseline I did not note any deformities.   Neurologic speech is clear I do not see any lateralizing findings  Psych He is oriented to self will follow simple verbal commands---continues to be pleasant and cooperative during his stay here       Labs  12/23/2014.  WBC 4.5 hemoglobin 10.9 platelets 162.  Sodium 138 potassium 4.3 BUN 28 creatinine 1.19.  Liver function tests within normal limits  Cholesterol 189 triglycerides 101 HDL 44 LDL 125.  Hemoglobin A1c 9.2 Depakote level LXVII.6  .  11/29/2014.  WBC 4.2 hemoglobin 11.7 platelets 182  Nov 26 2013.  WBC 4.3 hemoglobin 10.5 platelets 120,000.  11/25/2013.  WBC 4.2 hemoglobin 10.3 platelets 108.  Valproic acid level LXXVI.5.  Sodium 139 potassium 4.5 BUN  32 creatinine 1.4  11/01/2014.  Valproic acid level LXXXVIII.  10/28/2014.  WBC 4.4 hemoglobin 10.5 platelets 175.  Sodium 142 potassium 3.7 BUN 25 creatinine 1.18.  Magnesium level II.0.  Depakote level XCVI.5.    10/27/2014.  Sodium 141 potassium 2.9 BUN 22 creatinine 1.11.  Depakote level 93.8.  10/16/2014.  WBC 10.1 hemoglobin 8.6 platelets 187.  Liver function tests within normal limits except albumin of 3.3.    .    ASSESSMENT/PLAN:     Left lower leg edema-with recent history of left leg DVT-again he is on anticoagulation with Xarelto-this appears to be  improved clinically he appears stable.                                                      Thrombocytopenia-platelets appear to have rebounded-will update lab  Renal insufficiency-creatinine most recently 1.19--it appears to be relatively baseline to improved Will update this.  Diabetes type 2- This appears relatively well controlled hemoglobin A1c of 9.2 I suspect reflects poor prior control and actually had been over 10 previously-will update this continues on Lantus 8 units twice a day  Y   Hypertension-again hydrochlorothiazide was  decreased secondary to renal  issues he is also on lisinopril 30 mg a day I took it manually today and got 110/62---he does not appear to have consistent elevations at this point will monitor  Question seizure disorder-he is on Depakote will update a level there've been no seizures during his stay here to my knowledge  Hyperlipidemia-he continues on a statin--recent liver function tests were within normal limits secondary to relatively advanced age somewhat conservative--at  this point will monitor  Dementia with behaviors-this appears to be stable apparently this was an issue at assisted living but his stay here behavior wise has been quite unremarkable he is pleasant I do not really note any issues per nursing     [phone]   .

## 2015-03-11 ENCOUNTER — Non-Acute Institutional Stay: Payer: Medicare PPO | Admitting: Internal Medicine

## 2015-03-11 ENCOUNTER — Encounter: Payer: Self-pay | Admitting: Internal Medicine

## 2015-03-11 DIAGNOSIS — R609 Edema, unspecified: Secondary | ICD-10-CM

## 2015-03-11 DIAGNOSIS — M20001 Unspecified deformity of right finger(s): Secondary | ICD-10-CM | POA: Diagnosis not present

## 2015-03-11 DIAGNOSIS — I82402 Acute embolism and thrombosis of unspecified deep veins of left lower extremity: Secondary | ICD-10-CM

## 2015-03-11 NOTE — Progress Notes (Signed)
Patient ID: Todd FendtMilton [name], male   DOB: 1931/12/19, 81 y.o.   MRN: [id number]  :     [facility]    LEVEL OF CARE:   SNF  This is an acute visit   CHIEF COMPLAINT: This is an acute visit secondary to follow-up left foot edema-right fourth finger pain      HISTORY OF PRESENT ILLNESS:   Patient is a pleasant elderly male with weakness  here for rehabilitation and monitoring--he had been hospitalized for altered mental status lethargy which apparently improved during  hospitalization he was empirically started apparently on Depakote for concerns he may be having seizures. After his discharge here He was found recently to have increased edema of his left lower leg and  Doppler ultrasound was positive for DVT nonocclusive thrombus on the proximal and distal left femoral vein and within the left popliteal vein-initially was started on Lovenox-he has now been transferred to Xarelto.   Clinically this appears to be improved there is still some residual edema of the left leg but this does not appear to be uncomfortable However his nurse who is quite familiar with him feels that he's had some left foot edema increased from baseline over the past couple days-he is also noted a deformity of his right fourth finger-apparently this is somewhat chronic but he did complain of pain with it yesterday.  I did palpate it today appeared to not be having much pain here or discomfort although he is a poor historian  He does not complain of any shortness of breath or left leg or foot pain.     His other medical issues appear to be relatively stable he does have a history of hypertension he is on hydrochlorothiazide 12.5 this was reduced from 25 secondary to renal issues-I got 110/62 manually to 2 days ago I see listed 136/46-149/60 previously.--And taken by machine today showed a blood pressure 175/69 I took it manually and got 168/82-she had just use the restroom however     y         PAST MEDICAL HISTORY/PROBLEM LIST:                  Recurrent syncope.  Note that he had an admission from 08/18/2014 through 08/20/2014 for this issue, also 08/31/2014 through 09/01/2014 with altered mental status, 10/03/2014 through 10/05/2014 for ?TIA symptoms.  He has also presented with an unresponsive episode.    Hypertension.    Type 2 diabetes.  previously  Poorly controlled with a hemoglobin A1c on 10/03/2014 of 12.3.  On home Lantus.    Dementia.    Chronic kidney disease with a baseline creatinine of 1.5.    Coronary artery disease.   Status post CABG.    Hyperlipidemia.    CURRENT MEDICATIONS:  Discharge medications include:       .    Lipitor 80 q.d.     Depakote 500 q.12.    Hydrochlorothiazide 12.5 q.d.    Lantus insulin 8 U in the morning and 10 U at bedtime.    Lisinopril 30 mg .    Vitamin B12, 1000 U daily.    Vitamin D3, 1000 U daily.  Potassium 20 mEq a day  Xarelto  20 milligrams daily    SOCIAL HISTORY:                    HOUSING:  Patient previously lived at assisted living [facility]-and apparently there are plans to return there if possible  sometime in the near future.   CODE STATUS:  He does come with a DNR status.    FAMILY HISTORY:   Not currently available.    REVIEW OF SYSTEMS:--Limited secondary to dementiaper nursing staff is stay here has been quite uneventful with no acute complaints not complaining of  leg pain shortness of breath chest pain there has been no increased bruising or bleeding noted In general no complaints fever or chills.  Skin-no rashes or itching noted.  Head ears eyes nose mouth and throat-does not complain of any sore throat visual changes.  Cardiac no chest  Pain--  left lower extremity edema-more on the left especially the left foot as noted previously by nursing apparently this has increased over the last couple days.  Respiratory-no cough or shortness of breath noted.  GI-apparently eats well does not  complaining of abdominal discomfort nausea or vomiting diarrhea or constipation.  Musculoskeletal-apparently joint pain has not been an issue --is not really complaining of any pain in his left leg.-Or right fourth finger today-  Neurologic again no syncopal episodes have been noted.  Psych he does have significant dementia but behaviors have been well-controlled he's been pleasant and cooperative-.   [name]    PHYSICAL EXAMINATION:    Temperature 97.6 pulse 64 respirations 16 blood pressure 175/69 by machine I got 168/82 manually and O2 saturation is 94%  --weight is 186 this appears to be stable GENERAL APPEARANCE:  The patient is not in any distress. He does speak but does not talk a lot  Probably on the severe side of dementia, but no evidence of delirium.  His skin is warm and dry.--No increased bruising or bleeding noted  Eyes pupils appear reactive light sclerae   clear --visual acuity appears grossly intact CHEST/RESPIRATORY:  Clear air entry bilaterally-no -labored breathing.   CARDIOVASCULAR:  CARDIAC:   Heart sounds are normal with no carotid bruits l1-2/6 systolic murmur  Regular  rate and rhythm.--Has some  lower extremity edema  more in the left versus the right--pedal pulses intact edema in the left foot does appear to be somewhat increased compared to exam earlier this week--   GASTROINTESTINAL:  ABDOMEN:   Soft, nontender.  No masses. Positive bowel sounds   . [name] Musculoskeletal-moves all extremities 4 --- actually is ambulatory Right fourth finger does have a flexion deformity with stiffness-however I do not note any edema or acute tenderness to palpation here or erythema  warmth or tenderness to palpation here  --pedal pulses somewhat reduced but palpable  SENSATION/STRENGTH:  Strength is normal--he is ambulatory moves all extremities at baseline I did not note any deformities.   Neurologic speech is clear I do not see any lateralizing findings  Psych He is oriented to  self will follow simple verbal commands---continues to be pleasant and cooperative during his stay here       Labs  12/23/2014.  WBC 4.5 hemoglobin 10.9 platelets 162.  Sodium 138 potassium 4.3 BUN 28 creatinine 1.19.  Liver function tests within normal limits  Cholesterol 189 triglycerides 101 HDL 44 LDL 125.  Hemoglobin A1c 9.2 Depakote level LXVII.6  .  11/29/2014.  WBC 4.2 hemoglobin 11.7 platelets 182  Nov 26 2013.  WBC 4.3 hemoglobin 10.5 platelets 120,000.  11/25/2013.  WBC 4.2 hemoglobin 10.3 platelets 108.  Valproic acid level LXXVI.5.  Sodium 139 potassium 4.5 BUN 32 creatinine 1.4  11/01/2014.  Valproic acid level LXXXVIII.  10/28/2014.  WBC 4.4 hemoglobin 10.5 platelets 175.  Sodium 142 potassium  3.7 BUN 25 creatinine 1.18.  Magnesium level II.0.  Depakote level XCVI.5.    10/27/2014.  Sodium 141 potassium 2.9 BUN 22 creatinine 1.11.  Depakote level 93.8.  10/16/2014.  WBC 10.1 hemoglobin 8.6 platelets 187.  Liver function tests within normal limits except albumin of 3.3.    .    ASSESSMENT/PLAN:     Left lower leg edema-with recent history of left leg DVT-again he is on anticoagulation with Xarelto-nursing initially note some increase left foot edema apparently yesterday-will recheck a venous Doppler clinically he appears stable he is not having any discomfort with this.    #2-right fourth finger deformity will check an xray--he does not appear to have much pain with this today.    Hypertension-ahydrochlorothiazide was  decreased secondary to renal issues he is also on lisinopril 30 mg a day--I did recheck his blood pressure did come down to 130/70 after he had rested a bit at this point will monitor   [id number]                .

## 2015-03-14 ENCOUNTER — Ambulatory Visit (HOSPITAL_COMMUNITY)
Admit: 2015-03-14 | Discharge: 2015-03-14 | Disposition: A | Discharge status: Home / Self Care | Payer: Medicare PPO | Source: Ambulatory Visit | Attending: Internal Medicine | Admitting: Internal Medicine

## 2015-03-14 ENCOUNTER — Other Ambulatory Visit (HOSPITAL_COMMUNITY)
Admission: RE | Admit: 2015-03-14 | Discharge: 2015-03-14 | Disposition: A | Discharge status: Home / Self Care | Payer: Medicare PPO | Source: Ambulatory Visit | Attending: Internal Medicine | Admitting: Internal Medicine

## 2015-03-14 ENCOUNTER — Inpatient Hospital Stay (HOSPITAL_COMMUNITY)
Admit: 2015-03-14 | Discharge: 2015-03-14 | Disposition: A | Discharge status: Home / Self Care | Payer: Medicare PPO | Attending: Internal Medicine | Admitting: Internal Medicine

## 2015-03-14 DIAGNOSIS — M79662 Pain in left lower leg: Secondary | ICD-10-CM | POA: Diagnosis present

## 2015-03-14 DIAGNOSIS — R600 Localized edema: Secondary | ICD-10-CM | POA: Diagnosis not present

## 2015-03-14 DIAGNOSIS — Z7901 Long term (current) use of anticoagulants: Secondary | ICD-10-CM | POA: Diagnosis not present

## 2015-03-14 DIAGNOSIS — Z86718 Personal history of other venous thrombosis and embolism: Secondary | ICD-10-CM | POA: Diagnosis not present

## 2015-03-14 DIAGNOSIS — Z87891 Personal history of nicotine dependence: Secondary | ICD-10-CM | POA: Diagnosis not present

## 2015-03-14 LAB — CBC WITH DIFFERENTIAL/PLATELET
BASOS ABS: 0 10*3/uL (ref 0.0–0.1)
Basophils Relative: 0 % (ref 0–1)
Eosinophils Absolute: 0.1 10*3/uL (ref 0.0–0.7)
Eosinophils Relative: 2 % (ref 0–5)
HCT: 32.9 % — ABNORMAL LOW (ref 39.0–52.0)
Hemoglobin: 10.9 g/dL — ABNORMAL LOW (ref 13.0–17.0)
LYMPHS PCT: 39 % (ref 12–46)
Lymphs Abs: 2.2 10*3/uL (ref 0.7–4.0)
MCH: 32.9 pg (ref 26.0–34.0)
MCHC: 33.1 g/dL (ref 30.0–36.0)
MCV: 99.4 fL (ref 78.0–100.0)
MONO ABS: 0.6 10*3/uL (ref 0.1–1.0)
Monocytes Relative: 10 % (ref 3–12)
NEUTROS ABS: 2.7 10*3/uL (ref 1.7–7.7)
Neutrophils Relative %: 49 % (ref 43–77)
Platelets: 175 10*3/uL (ref 150–400)
RBC: 3.31 MIL/uL — ABNORMAL LOW (ref 4.22–5.81)
RDW: 14.6 % (ref 11.5–15.5)
WBC: 5.6 10*3/uL (ref 4.0–10.5)

## 2015-03-14 LAB — BASIC METABOLIC PANEL
Anion gap: 8 (ref 5–15)
BUN: 26 mg/dL — AB (ref 6–20)
CO2: 28 mmol/L (ref 22–32)
Calcium: 8.6 mg/dL — ABNORMAL LOW (ref 8.9–10.3)
Chloride: 102 mmol/L (ref 101–111)
Creatinine, Ser: 1.27 mg/dL — ABNORMAL HIGH (ref 0.61–1.24)
GFR calc Af Amer: 59 mL/min — ABNORMAL LOW (ref >60)
GFR, EST NON AFRICAN AMERICAN: 51 mL/min — AB (ref >60)
GLUCOSE: 192 mg/dL — AB (ref 65–99)
Potassium: 4.2 mmol/L (ref 3.5–5.1)
SODIUM: 138 mmol/L (ref 135–145)

## 2015-03-16 ENCOUNTER — Inpatient Hospital Stay
Admission: RE | Admit: 2015-03-16 | Discharge: 2015-10-02 | Disposition: A | Discharge status: Other Acute Inpatient Hospital | Payer: Medicare PPO | Source: Ambulatory Visit | Attending: Internal Medicine | Admitting: Internal Medicine

## 2015-03-16 ENCOUNTER — Encounter (HOSPITAL_COMMUNITY): Payer: Self-pay | Admitting: Emergency Medicine

## 2015-03-16 ENCOUNTER — Other Ambulatory Visit: Payer: Self-pay

## 2015-03-16 ENCOUNTER — Emergency Department (HOSPITAL_COMMUNITY)
Admission: EM | Admit: 2015-03-16 | Discharge: 2015-03-16 | Disposition: A | Discharge status: Home / Self Care | Payer: Medicare PPO | Attending: Emergency Medicine | Admitting: Emergency Medicine

## 2015-03-16 ENCOUNTER — Emergency Department (HOSPITAL_COMMUNITY): Payer: Medicare PPO

## 2015-03-16 ENCOUNTER — Non-Acute Institutional Stay: Payer: Medicare PPO | Admitting: Internal Medicine

## 2015-03-16 DIAGNOSIS — F0390 Unspecified dementia without behavioral disturbance: Secondary | ICD-10-CM | POA: Diagnosis not present

## 2015-03-16 DIAGNOSIS — Z7982 Long term (current) use of aspirin: Secondary | ICD-10-CM | POA: Insufficient documentation

## 2015-03-16 DIAGNOSIS — E119 Type 2 diabetes mellitus without complications: Secondary | ICD-10-CM | POA: Insufficient documentation

## 2015-03-16 DIAGNOSIS — R55 Syncope and collapse: Secondary | ICD-10-CM | POA: Diagnosis not present

## 2015-03-16 DIAGNOSIS — N189 Chronic kidney disease, unspecified: Secondary | ICD-10-CM | POA: Insufficient documentation

## 2015-03-16 DIAGNOSIS — Z7901 Long term (current) use of anticoagulants: Secondary | ICD-10-CM | POA: Insufficient documentation

## 2015-03-16 DIAGNOSIS — I129 Hypertensive chronic kidney disease with stage 1 through stage 4 chronic kidney disease, or unspecified chronic kidney disease: Secondary | ICD-10-CM | POA: Diagnosis not present

## 2015-03-16 DIAGNOSIS — Z87891 Personal history of nicotine dependence: Secondary | ICD-10-CM | POA: Diagnosis not present

## 2015-03-16 DIAGNOSIS — E780 Pure hypercholesterolemia: Secondary | ICD-10-CM | POA: Diagnosis not present

## 2015-03-16 DIAGNOSIS — Z794 Long term (current) use of insulin: Secondary | ICD-10-CM | POA: Insufficient documentation

## 2015-03-16 DIAGNOSIS — R404 Transient alteration of awareness: Secondary | ICD-10-CM

## 2015-03-16 DIAGNOSIS — R609 Edema, unspecified: Secondary | ICD-10-CM | POA: Diagnosis not present

## 2015-03-16 DIAGNOSIS — R4189 Other symptoms and signs involving cognitive functions and awareness: Secondary | ICD-10-CM

## 2015-03-16 DIAGNOSIS — Z79899 Other long term (current) drug therapy: Secondary | ICD-10-CM | POA: Diagnosis not present

## 2015-03-16 DIAGNOSIS — Z951 Presence of aortocoronary bypass graft: Secondary | ICD-10-CM | POA: Insufficient documentation

## 2015-03-16 DIAGNOSIS — M1990 Unspecified osteoarthritis, unspecified site: Secondary | ICD-10-CM | POA: Insufficient documentation

## 2015-03-16 LAB — COMPREHENSIVE METABOLIC PANEL
ALT: 49 U/L (ref 17–63)
ANION GAP: 8 (ref 5–15)
AST: 41 U/L (ref 15–41)
Albumin: 3.2 g/dL — ABNORMAL LOW (ref 3.5–5.0)
Alkaline Phosphatase: 52 U/L (ref 38–126)
BUN: 28 mg/dL — AB (ref 6–20)
CALCIUM: 8.7 mg/dL — AB (ref 8.9–10.3)
CHLORIDE: 101 mmol/L (ref 101–111)
CO2: 29 mmol/L (ref 22–32)
CREATININE: 1.16 mg/dL (ref 0.61–1.24)
GFR, EST NON AFRICAN AMERICAN: 57 mL/min — AB (ref >60)
Glucose, Bld: 157 mg/dL — ABNORMAL HIGH (ref 65–99)
Potassium: 4.2 mmol/L (ref 3.5–5.1)
Sodium: 138 mmol/L (ref 135–145)
TOTAL PROTEIN: 6.3 g/dL — AB (ref 6.5–8.1)
Total Bilirubin: 0.6 mg/dL (ref 0.3–1.2)

## 2015-03-16 LAB — CBC WITH DIFFERENTIAL/PLATELET
BASOS ABS: 0 10*3/uL (ref 0.0–0.1)
Basophils Relative: 0 % (ref 0–1)
EOS PCT: 1 % (ref 0–5)
Eosinophils Absolute: 0.1 10*3/uL (ref 0.0–0.7)
HCT: 30.9 % — ABNORMAL LOW (ref 39.0–52.0)
Hemoglobin: 10.2 g/dL — ABNORMAL LOW (ref 13.0–17.0)
LYMPHS PCT: 13 % (ref 12–46)
Lymphs Abs: 0.8 10*3/uL (ref 0.7–4.0)
MCH: 32.7 pg (ref 26.0–34.0)
MCHC: 33 g/dL (ref 30.0–36.0)
MCV: 99 fL (ref 78.0–100.0)
Monocytes Absolute: 0.7 10*3/uL (ref 0.1–1.0)
Monocytes Relative: 13 % — ABNORMAL HIGH (ref 3–12)
NEUTROS PCT: 73 % (ref 43–77)
Neutro Abs: 4.4 10*3/uL (ref 1.7–7.7)
PLATELETS: 161 10*3/uL (ref 150–400)
RBC: 3.12 MIL/uL — AB (ref 4.22–5.81)
RDW: 14.5 % (ref 11.5–15.5)
WBC: 5.9 10*3/uL (ref 4.0–10.5)

## 2015-03-16 NOTE — Discharge Instructions (Signed)
Patient stable for discharge back to nursing facility. According to patient's daughter patient had done this in the past when he was in assisted living. Several workups in the past without any definitive findings. Patient's workup here was negative. Patient without any complaints completely nontoxic no acute distress. Patient without any cardiac arrhythmias CBC and electrolytes all normal. Unable to get urine from him. But doubt that there is a urinary tract infection. Patient's chest x-ray raises some concerns for possible early pneumonia patient clinically not showing any signs or symptoms of that no cough no fever no leukocytosis. Would recommend just keeping that in mind if symptoms develop treating appropriately.

## 2015-03-16 NOTE — ED Notes (Signed)
Spoke with pt's daughter Todd [name] and updated her on pt's condition. Can call Todd [name] at [phone] for any questions/concerns,etc...[name]

## 2015-03-16 NOTE — ED Notes (Signed)
Per TIna at [facility]- pt found unresponsive sitting in chair with O2 sats 60%. Stated episode lasted 10 minutes, pt O2 sats increased to 90's%, cbg-175. States pt was generally weaker than usual and unable to ambulate to stretcher with walker as per baseline. Pt alert upon ED arrival. nad noted.

## 2015-03-16 NOTE — ED Provider Notes (Addendum)
CSN: [id number]     Arrival date & time 03/16/15  1845 History   First MD Initiated Contact with Patient 03/16/15 1900     Chief Complaint  Patient presents with  . Loss of Consciousness     (Consider location/radiation/quality/duration/timing/severity/associated sxs/prior Treatment) Patient is a 81 y.o. male presenting with syncope. The history is provided by the patient, the EMS personnel and the nursing home. The history is limited by the condition of the patient.  Loss of Consciousness  patient from [facility] found unresponsive sitting in a chair. Reportedly O2 sats were 60%. Supposedly lasted 10 minutes but time EMS arrived patient sats were 90% blood sugar was 175 patient was back to normal. Level V caveat applies to some of his auricle information due to his history of dementia.  According to the patient's daughter he has had trouble with single episodes in the past a workup was never found a real cause. Patient is a DO NOT RESUSCITATE. Past Medical History  Diagnosis Date  . Elevated cholesterol   . Hypertension   . Type II diabetes mellitus   . Dementia     "don't know stage or type" (08/18/2014)  . Arthritis     "probably"  . Chronic kidney disease     "? stage" (08/18/2014)   Past Surgical History  Procedure Laterality Date  . Tonsillectomy    . Coronary artery bypass graft  ?1994    "CABG X3"  . Cataract extraction     No family history on file. History  Substance Use Topics  . Smoking status: Former Smoker    Types: Pipe  . Smokeless tobacco: Never Used     Comment: "quit smoking a pipe in the 80's"  . Alcohol Use: No    Review of Systems  Unable to perform ROS Cardiovascular: Positive for syncope.   level V caveat applies due to the patient known to have some dementia. However he denies any specific complaints.    Allergies  Review of patient's allergies indicates no known allergies.  Home Medications   Prior to Admission medications    Medication Sig Start Date End Date Taking? Authorizing Provider  aspirin EC 325 MG tablet Take 325 mg by mouth daily.    Historical Provider, MD  atorvastatin (LIPITOR) 80 MG tablet Take 1 tablet (80 mg total) by mouth daily at 6 PM. Patient not taking: Reported on 10/16/2014 10/05/14   Stark BrayJulia E Mallory, MD  Cholecalciferol (VITAMIN D-3) 1000 UNITS CAPS Take 1,000 Units by mouth daily.    Historical Provider, MD  divalproex (DEPAKOTE) 500 MG DR tablet Take 1 tablet (500 mg total) by mouth every 12 (twelve) hours. 10/18/14   Stark BrayJulia E Mallory, MD  hydrochlorothiazide (HYDRODIURIL) 25 MG tablet Take 12.5 mg by mouth daily.     Historical Provider, MD  insulin glargine (LANTUS) 100 UNIT/ML injection Inject 8 Units into the skin 2 (two) times daily. 8 units subq every morning at 7am and at bedtime    Historical Provider, MD  lisinopril (PRINIVIL,ZESTRIL) 20 MG tablet Take 1 tablet (20 mg total) by mouth daily. 10/19/14   Stark BrayJulia E Mallory, MD  potassium chloride (KLOR-CON) 20 MEQ packet Take by mouth daily.    Historical Provider, MD  Rivaroxaban (XARELTO) 15 MG TABS tablet Take 20 mg by mouth 1 day or 1 dose.     Historical Provider, MD  vitamin B-12 (CYANOCOBALAMIN) 1000 MCG tablet Take 1,000 mcg by mouth daily.    Historical Provider, MD  BP 159/108 mmHg  Pulse 78  Temp(Src) 98.2 F (36.8 C) (Oral)  Resp 19  SpO2 100% Physical Exam  Constitutional: He is oriented to person, place, and time. He appears well-developed and well-nourished. No distress.  HENT:  Head: Normocephalic and atraumatic.  Mouth/Throat: Oropharynx is clear and moist.  Eyes: Conjunctivae and EOM are normal. Pupils are equal, round, and reactive to light.  Neck: Normal range of motion.  Cardiovascular: Normal rate, regular rhythm and normal heart sounds.   No murmur heard. Pulmonary/Chest: Effort normal and breath sounds normal. No respiratory distress.  Abdominal: Soft. Bowel sounds are normal. There is no tenderness.   Musculoskeletal: Normal range of motion. He exhibits edema.  Neurological: He is alert and oriented to person, place, and time. No cranial nerve deficit. He exhibits normal muscle tone. Coordination normal.  Skin: Skin is warm. No rash noted.  Nursing note and vitals reviewed.   ED Course  Procedures (including critical care time) Labs Review Labs Reviewed  CBC WITH DIFFERENTIAL/PLATELET - Abnormal; Notable for the following:    RBC 3.12 (*)    Hemoglobin 10.2 (*)    HCT 30.9 (*)    Monocytes Relative 13 (*)    All other components within normal limits  COMPREHENSIVE METABOLIC PANEL  URINALYSIS, ROUTINE W REFLEX MICROSCOPIC    Imaging Review Dg Chest Port 1 View  03/16/2015   CLINICAL DATA:  Unresponsive, hypoxia  EXAM: PORTABLE CHEST - 1 VIEW  COMPARISON:  10/16/2014  FINDINGS: Mild patchy opacity at the medial right lung base, atelectasis versus pneumonia. No pleural effusion or pneumothorax.  Cardiomegaly.  Postsurgical changes related to prior CABG.  IMPRESSION: Mild patchy opacity at the medial right lung base, atelectasis versus pneumonia.   Electronically Signed   By: Charline Bills M.D.   On: 03/16/2015 20:51     EKG Interpretation None     ED ECG REPORT   Date: 03/16/2015  Rate: 79  Rhythm: atrial flutter  QRS Axis: normal  Intervals: normal  ST/T Wave abnormalities: nonspecific ST/T changes  Conduction Disutrbances:none  Narrative Interpretation:   Old EKG Reviewed: none available Computer-aided this is atrial flutter. I think this patient is a normal sinus rhythm is just lots of artifact. Will have them repeated.   I have personally reviewed the EKG tracing and agree with the computerized printout as noted.     MDM   Final diagnoses:  Syncope    Patient from nursing home. Patient has had a history of passing out in the past. First time is done and at the [facility]. Patient was in assisted living prior to this. Patient was fine upon arrival by  EMS sats were 90% blood sugar was 175. Patient here in no acute distress no complaints. No cough no fever. Cardiac monitoring without evidence of any arrhythmias. The really no tachycardia either other than the one isolated reported recording but everything else is been pretty much in the upper 70s.  Patient clinically does not act like a pneumonia. Chest x-ray raises some concern for that. I think this can just be watched. Patient without cough. If patient's other labs are well without any significant abnormalities patient will be discharged back to the nursing facility.  Patient is a DO NOT RESUSCITATE patient.  Patient without any neuro deficits. Patient's mental status is baseline. Patient is cooperative here follows commands on exam no complaints at all.  EKG is still pending.  EKG was found. Has a lot of artifact. EKG read as  atrial flutter but I think it's just the artifact. I think it's a sinus type rhythm. On cardiac monitoring appears to be sinus rhythm will repeated.  Vanetta MuldersScott Saje Gallop, MD 03/16/15 2118  Repeat EKG has per below. Is consistent with sinus rhythm. Still with some artifact.  ED ECG REPORT   Date: 03/16/2015  Rate: 72  Rhythm: normal sinus rhythm  QRS Axis: normal  Intervals: normal  ST/T Wave abnormalities: nonspecific ST/T changes  Conduction Disutrbances:none  Narrative Interpretation:   Old EKG Reviewed: none available  I have personally reviewed the EKG tracing and agree with the computerized printout as noted. No salient change in EKG compared to the other except for the artifacts approved this is clearly consistent with a sinus rhythm. May be a PVC.  Vanetta MuldersScott Kadi Hession, MD 03/16/15 2142

## 2015-03-18 ENCOUNTER — Encounter: Payer: Self-pay | Admitting: Internal Medicine

## 2015-03-18 ENCOUNTER — Non-Acute Institutional Stay: Payer: Medicare PPO | Admitting: Internal Medicine

## 2015-03-18 DIAGNOSIS — R55 Syncope and collapse: Secondary | ICD-10-CM | POA: Diagnosis not present

## 2015-03-18 DIAGNOSIS — R404 Transient alteration of awareness: Secondary | ICD-10-CM

## 2015-03-18 NOTE — Progress Notes (Signed)
Patient ID: Todd [name], male   DOB: 04-20-1932, 81 y.o.   MRN: [id number]  :      [facility]    LEVEL OF CARE:   SNF  This is an acute visit   CHIEF COMPLAINT: Acute visit follow-up unresponsive episode with ER visit      HISTORY OF PRESENT ILLNESS:   Patient is a pleasant elderly male with weakness  here for rehabilitation and monitoring--he had been hospitalized for altered mental status lethargy which apparently improved during  hospitalization he was empirically started apparently on Depakote for concerns he may be having seizures. After his discharge here He was found recently to have increased edema of his left lower leg and  Doppler ultrasound was positive for DVT nonocclusive thrombus on the proximal and distal left femoral vein and within the left popliteal vein-initially was started on Lovenox-he has now been transferred to Xarelto.   2 days ago patient could not be awoken-he was unresponsive initially with an O2 sats rations 60s-initially it was difficult to get his blood pressure but eventually a systolic of 98 was obtained-.  The unresponsiveness to stimuli lasted for several minutes however by the time EMS arrived patient had opened his eyes and was talking some.  There was some concern about an acute event possible CVA and he did go to the ER however by the time he got to the ER he was back at his baseline alert talking it appears vital signs had stabilized although it appears his blood pressure was somewhat elevated listed as 159/108 in the ER.  Workup in the ER was negative blood work was unremarkable EKG apparently showed normal sinus rhythm with possible PVCs  Chest x-ray showed a mild patchy infiltrate right medial lung base atelectasis versus pneumonia-she has been afebrile did not complaining of any cough or shortness of breath he does not appear to be symptomatic of any respiratory process currently.  Currently he is back at his baseline  sitting in his chair he is alert responsive smiling confused which is his baseline I do not really see any change from his normal presentation.  Blood pressure is somewhat elevated at 167/79-he is on numerous agents including either chlorothiazide 12.5 mg daily lisinopril which was just recently increased to 30 mg a day  It appears he has a history of unresponsive episodes previously although has not had one during his stay here.  Apparently no etiology was found and he quickly returned to his baseline which appears to be the case once again     y       PAST MEDICAL HISTORY/PROBLEM LIST:                  Recurrent syncope.  Note that he had an admission from 08/18/2014 through 08/20/2014 for this issue, also 08/31/2014 through 09/01/2014 with altered mental status, 10/03/2014 through 10/05/2014 for ?TIA symptoms.  He has also presented with an unresponsive episode.    Hypertension.    Type 2 diabetes.  previously  Poorly controlled with a hemoglobin A1c on 10/03/2014 of 12.3.  On home Lantus.    Dementia.    Chronic kidney disease with a baseline creatinine of 1.5.    Coronary artery disease.   Status post CABG.    Hyperlipidemia.    CURRENT MEDICATIONS:  Discharge medications include:       .    Lipitor 80 q.d.     Depakote 500 q.12.    Hydrochlorothiazide 12.5 q.d.  Lantus insulin 8 U in the morning and 10 U at bedtime.    Lisinopril 30 mg .    Vitamin B12, 1000 U daily.    Vitamin D3, 1000 U daily.  Potassium 20 mEq a day  Xarelto  20 milligrams daily    SOCIAL HISTORY:                    [place]:  [facility] [address]   CODE STATUS:  He does come with a DNR status.    FAMILY HISTORY:   Not currently available.    REVIEW OF SYSTEMS:--Limited secondary to dementia--per nursing he has been back at his baseline since returning from ER   Skin-no rashes or itching noted.  Head ears eyes nose mouth and throat-does not  complain of any sore throat visual changes.  Cardiac no chest  Pain--  left lower extremity edema-which appears relatively baseline  Respiratory-no cough or shortness of breath noted.  GI-apparently eats well does not complaining of abdominal discomfort nausea or vomiting diarrhea or constipation.  Musculoskeletal- Does not complain of joint pain today  Neurologic --apparent syncopal episode 2 days ago as noted above but he does not complain of any headache dizziness there's been no further syncopal-type activity  Psych he does have significant dementia but behaviors have been well-controlled he's been pleasant and cooperative-.   [name].    PHYSICAL EXAMINATION:    Temperature is 98.4 pulse 68 respirations 20 blood pressure 163/79 GENERAL APPEARANCE:  The patient is not in any distress. He does speak but does not talk a lot  Probably on the severe side of dementia, but no evidence of delirium--he is sitting comfortably in his chair.  His skin is warm and dry.--No increased bruising or bleeding noted  Eyes pupils appear reactive light sclerae   clear --visual acuity appears grossly intact CHEST/RESPIRATORY:  Clear air entry bilaterally-no -labored breathing.   CARDIOVASCULAR:  CARDIAC:   Heart sounds are normal with an occasional irregular beat with no carotid bruits l1-2/6 systolic murmur --Has some  lower extremity edema  more in the left versus the right--but this does not appear to be grossly changed from previous exam--   GASTROINTESTINAL:  ABDOMEN:   Soft, nontender.  No masses. Positive bowel sounds   . [name]. Musculoskeletal-moves all extremities 4 --- not appreciate any lateralizing findings Right fourth finger does have a flexion deformity with stiffness-however I do not note any edema or acute tenderness to palpation here or erythema  warmth or tenderness to palpation here  --pedal pulses somewhat reduced but palpable  SENSATION/STRENGTH:  Strength is normal--he is ambulatory  moves all extremities at baseline I did not note any deformities.   Neurologic speech is clear I do not see any lateralizing findings  Psych He is oriented to self will follow simple verbal commands---continues to be pleasant and cooperative       Labs   03/16/2015.  WBC 5.9 hemoglobin 10.2 platelets 161.  Sodium 138 potassium 4.2 BUN 28 creatinine 1.16.  Albumin 3.2 otherwise liver function tests within normal limits  12/23/2014.  WBC 4.5 hemoglobin 10.9 platelets 162.  Sodium 138 potassium 4.3 BUN 28 creatinine 1.19.  Liver function tests within normal limits  Cholesterol 189 triglycerides 101 HDL 44 LDL 125.  Hemoglobin A1c 9.2 Depakote level LXVII.6  .  11/29/2014.  WBC 4.2 hemoglobin 11.7 platelets 182  Nov 26 2013.  WBC 4.3 hemoglobin 10.5 platelets 120,000.  11/25/2013.  WBC 4.2  hemoglobin 10.3 platelets 108.  Valproic acid level LXXVI.5.  Sodium 139 potassium 4.5 BUN 32 creatinine 1.4  11/01/2014.  Valproic acid level LXXXVIII.  10/28/2014.  WBC 4.4 hemoglobin 10.5 platelets 175.  Sodium 142 potassium 3.7 BUN 25 creatinine 1.18.  Magnesium level II.0.  Depakote level XCVI.5.    10/27/2014.  Sodium 141 potassium 2.9 BUN 22 creatinine 1.11.  Depakote level 93.8.  10/16/2014.  WBC 10.1 hemoglobin 8.6 platelets 187.  Liver function tests within normal limits except albumin of 3.3.    .    ASSESSMENT/PLAN:     #1-altered mental status-this appears to have resolved unremarkably workup in the ER  was unremarkable-he has been his baseline since-at this point continue to monitor it appears he at times will have these episodes of unknown etiology at this point will monitor .  At this point monitor vital signs pulse ox every shift for 72 hours and  Notify provider of any cough or congestion or  Symptoms that he may have a respiratory issue.  #2 hypertension-his lisinopril was increased earlier this week-at this point will monitor  if consistently elevated suspect we will increase his lisinopril further   [id number]--of note greater than 25 minutes spent assessing patient-reviewing his medical records including ER details-and coordinating plan of care-                .

## 2015-03-20 ENCOUNTER — Encounter: Payer: Self-pay | Admitting: Internal Medicine

## 2015-03-20 DIAGNOSIS — R4189 Other symptoms and signs involving cognitive functions and awareness: Secondary | ICD-10-CM | POA: Insufficient documentation

## 2015-03-20 NOTE — Progress Notes (Signed)
Patient ID: Todd [name], male   DOB: [birth year], 81 y.o.   MRN: [id number]       ACILITY: [facility]    LEVEL OF CARE:   SNF  This is an acute visit   CHIEF COMPLAINT: Acute visit secondary to unresponsive episode      HISTORY OF PRESENT ILLNESS:   Patient is a pleasant elderly male with weakness  here for rehabilitation and monitoring--he had been hospitalized for altered mental status lethargy which apparently improved during  hospitalization he was empirically started apparently on Depakote for concerns he may be having seizures. After his discharge here He was found recently to have increased edema of his left lower leg and  Doppler ultrasound was positive for DVT nonocclusive thrombus on the proximal and distal left femoral vein and within the left popliteal vein-initially was started on Lovenox-he has now been transferred to Xarelto    he was found unresponsive sitting in his wheelchair this evening-O2 saturation originally was in the 60s this came up quickly into the 90s on oxygen-his blood pressure was initially difficult to obtain but eventually read out 98/60-his blood sugar was 176 he is a type II diabetic.  He apparently was afebrile did not appear to be in distress.  EMS was called-in the interim patient gradually started to respond by moving his right hand-then opened his eyes-by the time EMS arrived he did speak some but was somewhat confused and agitated which is unlike him recently.   He does have a history of syncopal episodes in the past apparently no etiology was found-this has not been an issue however during his stay here.        .        y       PAST MEDICAL HISTORY/PROBLEM LIST:                  Recurrent syncope.  Note that he had an admission from 08/18/2014 through 08/20/2014 for this issue, also 08/31/2014 through 09/01/2014 with altered mental status, 10/03/2014 through 10/05/2014 for ?TIA symptoms.  He has also presented  with an unresponsive episode.    Hypertension.    Type 2 diabetes.  previously  Poorly controlled with a hemoglobin A1c on 10/03/2014 of 12.3.  On home Lantus.    Dementia.    Chronic kidney disease with a baseline creatinine of 1.5.    Coronary artery disease.   Status post CABG.    Hyperlipidemia.    CURRENT MEDICATIONS:  Discharge medications include:       .    Lipitor 80 q.d.     Depakote 500 q.12.    Hydrochlorothiazide 12.5 q.d.    Lantus insulin 8 U in the morning and 10 U at bedtime.    Lisinopril 30 mg .    Vitamin B12, 1000 U daily.    Vitamin D3, 1000 U daily.  Potassium 20 mEq a day  Xarelto  20 milligrams daily    SOCIAL HISTORY:                    HOUSING:  Patient previously lived at assisted living [facility]-and apparently there are plans to return there if possible sometime in the near future.   CODE STATUS:  He does come with a DNR status.    FAMILY HISTORY:   Not currently available.    REVIEW OF SYSTEMS:- Unattainable secondary to unresponsiveness--according nursing staff he was at his baseline today and earlier this evening talking  somewhat confused but pleasant and cooperative  In general no complaints fever or chills.      .    PHYSICAL EXAMINATION:    Is afebrile pulse of 56 respirations 16 blood pressure 98/60 O2 saturation originally in the 60s came up to the 90s on oxygen-CBG 176  In general-this is an elderly male slumped in his wheelchair initially unresponsive he is breathing-.  His skin is warm and dry is not diaphoretic.  Eyes pupils appear somewhat sluggish sclera and conjunctiva clear.  Oropharynx appears to be clear mucous membranes moist.  Chest very poor respiratory effort does not follow verbal instructions I did not hear any overt congestion breathing is not labored.  Heart is slightly bradycardic at 56 with a slight 1 to 2/6 systolic murmur which is baseline he has baseline lower extremity edema.  Abdomen is  soft does not appear to be tender there are positive bowel sounds.  Musculoskeletal again is not really moving his extremities.  Neurologic difficult assessment patient is not moving his extremities he is unresponsive.  Again before EMS arrived patient did start to move his right hand-and then he did open his eyes became somewhat responsive although sluggish initially-eventually was able to speak but was confused and somewhat agitated           Labs  03/14/2015.  Sodium 138 potassium 4.2 BUN 26 creatinine 1.27.  WBC 5.6 hemoglobin 10.9 platelets 175.    12/23/2014.  WBC 4.5 hemoglobin 10.9 platelets 162.  Sodium 138 potassium 4.3 BUN 28 creatinine 1.19.  Liver function tests within normal limits  Cholesterol 189 triglycerides 101 HDL 44 LDL 125.  Hemoglobin A1c 9.2 Depakote level LXVII.6  .  11/29/2014.  WBC 4.2 hemoglobin 11.7 platelets 182  Nov 26 2013.  WBC 4.3 hemoglobin 10.5 platelets 120,000.  11/25/2013.  WBC 4.2 hemoglobin 10.3 platelets 108.  Valproic acid level LXXVI.5.  Sodium 139 potassium 4.5 BUN 32 creatinine 1.4  11/01/2014.  Valproic acid level LXXXVIII.  10/28/2014.  WBC 4.4 hemoglobin 10.5 platelets 175.  Sodium 142 potassium 3.7 BUN 25 creatinine 1.18.  Magnesium level II.0.  Depakote level XCVI.5.    10/27/2014.  Sodium 141 potassium 2.9 BUN 22 creatinine 1.11.  Depakote level 93.8.  10/16/2014.  WBC 10.1 hemoglobin 8.6 platelets 187.  Liver function tests within normal limits except albumin of 3.3.    .    ASSESSMENT/PLAN:  #1-unresponsive episode-as noted above patient will be going to the ER for emergent evaluation-initially there was concerns for an acute process question CVA cardiac event-the patient now appears to be responding-again will await ER evaluation-it appearsshe does have some history of syncope although this has not really been an issue since he's come to this facility         [id number]                .

## 2015-03-23 ENCOUNTER — Encounter (HOSPITAL_COMMUNITY)
Admission: AD | Admit: 2015-03-23 | Discharge: 2015-03-23 | Disposition: A | Discharge status: Home / Self Care | Payer: Medicare PPO | Source: Skilled Nursing Facility | Attending: Internal Medicine | Admitting: Internal Medicine

## 2015-03-23 LAB — COMPREHENSIVE METABOLIC PANEL
ALK PHOS: 55 U/L (ref 38–126)
ALT: 45 U/L (ref 17–63)
AST: 38 U/L (ref 15–41)
Albumin: 3.2 g/dL — ABNORMAL LOW (ref 3.5–5.0)
Anion gap: 8 (ref 5–15)
BILIRUBIN TOTAL: 0.8 mg/dL (ref 0.3–1.2)
BUN: 20 mg/dL (ref 6–20)
CO2: 31 mmol/L (ref 22–32)
Calcium: 8.9 mg/dL (ref 8.9–10.3)
Chloride: 103 mmol/L (ref 101–111)
Creatinine, Ser: 1.17 mg/dL (ref 0.61–1.24)
GFR calc Af Amer: >60 mL/min (ref >60)
GFR calc non Af Amer: 57 mL/min — ABNORMAL LOW (ref >60)
GLUCOSE: 119 mg/dL — AB (ref 65–99)
POTASSIUM: 3.8 mmol/L (ref 3.5–5.1)
Sodium: 142 mmol/L (ref 135–145)
Total Protein: 6.3 g/dL — ABNORMAL LOW (ref 6.5–8.1)

## 2015-04-08 ENCOUNTER — Encounter (HOSPITAL_COMMUNITY)
Admission: AD | Admit: 2015-04-08 | Discharge: 2015-04-08 | Disposition: A | Discharge status: Home / Self Care | Payer: Medicare PPO | Source: Skilled Nursing Facility | Attending: Internal Medicine | Admitting: Internal Medicine

## 2015-04-08 DIAGNOSIS — N183 Chronic kidney disease, stage 3 (moderate): Secondary | ICD-10-CM | POA: Diagnosis not present

## 2015-04-08 DIAGNOSIS — Z794 Long term (current) use of insulin: Secondary | ICD-10-CM | POA: Diagnosis not present

## 2015-04-08 DIAGNOSIS — E559 Vitamin D deficiency, unspecified: Secondary | ICD-10-CM | POA: Diagnosis not present

## 2015-04-08 DIAGNOSIS — I10 Essential (primary) hypertension: Secondary | ICD-10-CM | POA: Diagnosis not present

## 2015-04-08 DIAGNOSIS — Z79899 Other long term (current) drug therapy: Secondary | ICD-10-CM | POA: Diagnosis not present

## 2015-04-08 DIAGNOSIS — M6281 Muscle weakness (generalized): Secondary | ICD-10-CM | POA: Diagnosis not present

## 2015-04-08 LAB — VALPROIC ACID LEVEL: Valproic Acid Lvl: 43 ug/mL — ABNORMAL LOW (ref 50.0–100.0)

## 2015-04-09 LAB — HEMOGLOBIN A1C
Hgb A1c MFr Bld: 7.8 % — ABNORMAL HIGH (ref 4.8–5.6)
MEAN PLASMA GLUCOSE: 177 mg/dL

## 2015-04-19 ENCOUNTER — Non-Acute Institutional Stay: Payer: Medicare PPO | Admitting: Internal Medicine

## 2015-04-19 ENCOUNTER — Encounter: Payer: Self-pay | Admitting: Internal Medicine

## 2015-04-19 DIAGNOSIS — IMO0002 Reserved for concepts with insufficient information to code with codable children: Secondary | ICD-10-CM

## 2015-04-19 DIAGNOSIS — N189 Chronic kidney disease, unspecified: Secondary | ICD-10-CM

## 2015-04-19 DIAGNOSIS — I10 Essential (primary) hypertension: Secondary | ICD-10-CM

## 2015-04-19 DIAGNOSIS — F0391 Unspecified dementia with behavioral disturbance: Secondary | ICD-10-CM | POA: Diagnosis not present

## 2015-04-19 DIAGNOSIS — F03918 Unspecified dementia, unspecified severity, with other behavioral disturbance: Secondary | ICD-10-CM

## 2015-04-19 DIAGNOSIS — E1165 Type 2 diabetes mellitus with hyperglycemia: Secondary | ICD-10-CM | POA: Diagnosis not present

## 2015-04-19 NOTE — Progress Notes (Signed)
Patient ID: Todd [name], male   DOB: 03-10-1932, 81 y.o.   MRN: [id number]      ACILITY: [facility]    LEVEL OF CARE:   SNF  This is a routine visit   CHIEF COMPLAINT:  Medical management of chronic issues including weakness dementia hypertension history renal insufficiency- left lower leg DVT     HISTORY OF PRESENT ILLNESS:   Patient is a pleasant elderly male with weakness  here for rehabilitation and monitoring--he had been hospitalized for altered mental status lethargy which apparently improved during  hospitalization he was empirically started apparently on Depakote for concerns he may be having seizures. After his discharge here He was found recently to have increased edema of his left lower leg and  Doppler ultrasound was positive for DVT nonocclusive thrombus  on the proximal and distal left femoral vein and within the left popliteal vein-initially was started on Lovenox-he has now been transferred to Xarelto.  Clinically this appears to be improved there is still some residual edema of the left leg but this does not appear to be uncomfortable  This is complicated somewhat with a history of thrombocytopenia his platelets did go down to 108,000 here recently we have updated his labs and this appears actually improved with platelets of 82,000 as of February first and actually was stable at 161,000--on 03/16/2015 His hemoglobin has been stable most recently 10.2 on 03-16-15.  There's been no increased bruising or bleeding noted patient has no complaints he can be somewhat of a poor historian however secondary to dementia   he does have a history of hypertension he is on hydrochlorothiazide 12.5 this was reduced from 25 secondary to renal issues--also on lisinopril 30 mg a day-. Recent blood pressures listed ranged from 114/56-161/69---181/75--I got 154/74 manually   He also has a history of diabetes type 2 he is on twice a day dosing of Lantus 8 units in the morning 8  units at night--blood sugars appear to run somewhat variable in the mornings in the last week ranged from 95 up to 238 this morning-later in the day blood sugars run 94-219-appears average  is in the mid higher 100s.  4 PM sugars appear to run a bit higher ranging from 170-382.  This also appears to be the case at night with blood sugars largely in the 2 300 range although I do see 146 and 164     Today patient has no complaints he is a poor historian secondary to dementia but nursing staff reports no new issues   He was seen last month for episode of unresponsiveness that resulted in a trip to the ER actually by the time he got to the ER he was responding apparently at baseline-he apparently has a fairly long history of this previously-no etiology per previous workups was found.  There  have  been no further episodes Nursing does not report any recent acute issues-patient is a poor historian but has no complaints today  y       PAST MEDICAL HISTORY/PROBLEM LIST:                  Recurrent syncope.  Note that he had an admission from 08/18/2014 through 08/20/2014 for this issue, also 08/31/2014 through 09/01/2014 with altered mental status, 10/03/2014 through 10/05/2014 for ?TIA symptoms.  He has also presented with an unresponsive episode.    Hypertension.    Type 2 diabetes.  previously  Poorly controlled with a hemoglobin A1c on 10/03/2014  of 12.3.  On home Lantus.--This has improved to 7.8 on lab done on 04-08-15    Dementia.    Chronic kidney disease with a baseline creatinine of 1.5.    Coronary artery disease.   Status post CABG.    Hyperlipidemia.    CURRENT MEDICATIONS:  Discharge medications include:       .    Lipitor 80 q.d.     Depakote 500 q.12.    Hydrochlorothiazide 12.5 q.d.    Lantus insulin 8 U in the morning and 8 U at bedtime.    Lisinopril 30 mg .    Vitamin B12, 1000 U daily.    Vitamin D3, 1000 U daily.  Potassium 20 mEq a day  Xarelto  20  milligrams daily    SOCIAL HISTORY:                    HOUSING:  Patient previously lived at assisted living [facility]-and apparently there are plans to return there if possible sometime in the near future.   CODE STATUS:  He does come with a DNR status.    FAMILY HISTORY:   Not currently available.    REVIEW OF SYSTEMS:--Limited secondary to dementiaper nursing staff is stay here has been quite uneventful with no acute complaints not complaining of  leg pain shortness of breath chest pain there has been no increased bruising or bleeding noted In general no complaints fever or chills.  Skin-no rashes or itching noted.  Head ears eyes nose mouth and throat-does not complain of any sore throat visual changes.  Cardiac no chest  Pain-- mild left lower extremity edema-this has been chronic.  Respiratory-no cough or shortness of breath noted.  GI-apparently eats well does not complaining of abdominal discomfort nausea or vomiting diarrhea or constipation.  Musculoskeletal-apparently joint pain has not been an issue --is not really complaining of any pain in his left leg.  Neurologic again no syncopal episodes have been noted.  Psych he does have significant dementia but behaviors have been well-controlled he's been pleasant and cooperative-.   [name]    PHYSICAL EXAMINATION:   Temperature 97.9 pulse 64 respirations 20 blood pressure taken manually 154/74 last weight I see is 186 this will warrant updating  GENERAL APPEARANCE:  The patient is not in any distress. He does speak but does not talk a lot  Probably on the severe side of dementia, but no evidence of delirium he is pleasant and follows simple verbal commands.  His skin is warm and dry.--No increased bruising or bleeding noted  Eyes pupils appear reactive light sclerae   clear --visual acuity appears grossly intact CHEST/RESPIRATORY:  Clear air entry bilaterally-no -labored breathing.   CARDIOVASCULAR:  CARDIAC:   Heart sounds  are normal with no carotid bruits l1-2/6 systolic murmur  Regular  rate and rhythm.--Has some baseline lower extremity edema bit more in the left versus the right--pedal pulses intact   GASTROINTESTINAL:  ABDOMEN:   Soft, nontender.  No masses. Positive bowel sounds   . [name] Musculoskeletal-moves all extremities 4 --- the edema on the left leg is present -- there is no significant  warmth or tenderness to palpation here  --pedal pulses somewhat reduced SENSATION/STRENGTH:  Strength is normal--he is ambulatory moves all extremities at baseline I did not note any deformities.   Neurologic speech is clear I do not see any lateralizing findings  Psych He is oriented to self will follow simple verbal commands---continues to be pleasant and cooperative  during his stay here       Labs  04/08/2015.  Valproic acid 43.  Hemoglobin A1c 7.8 which actually is improvement from 3 months ago when it was 9.2 and 6 months ago when it was 12.3.  03/16/2015.  WBC 5.9 hemoglobin 10.2 platelets 161.  03/23/2015.  Sodium 142 potassium 3.8 BUN 20 creatinine 1.17.  Alb-3.2-otherwise liver function tests within normal limits.    12/23/2014.  WBC 4.5 hemoglobin 10.9 platelets 162.  Sodium 138 potassium 4.3 BUN 28 creatinine 1.19.  Liver function tests within normal limits  Cholesterol 189 triglycerides 101 HDL 44 LDL 125.  Hemoglobin A1c 9.2 Depakote level LXVII.6  .  11/29/2014.  WBC 4.2 hemoglobin 11.7 platelets 182  Nov 26 2013.  WBC 4.3 hemoglobin 10.5 platelets 120,000.  11/25/2013.  WBC 4.2 hemoglobin 10.3 platelets 108.  Valproic acid level LXXVI.5.  Sodium 139 potassium 4.5 BUN 32 creatinine 1.4  11/01/2014.  Valproic acid level LXXXVIII.  10/28/2014.  WBC 4.4 hemoglobin 10.5 platelets 175.  Sodium 142 potassium 3.7 BUN 25 creatinine 1.18.  Magnesium level II.0.  Depakote level XCVI.5.    10/27/2014.  Sodium 141 potassium 2.9 BUN 22 creatinine  1.11.  Depakote level 93.8.  10/16/2014.  WBC 10.1 hemoglobin 8.6 platelets 187.  Liver function tests within normal limits except albumin of 3.3.    .    ASSESSMENT/PLAN:     Left lower leg edema-with recent history of left leg DVT-again he is on anticoagulation with Xarelto-this appears to be  improved clinically he appears stable.                                                      Thrombocytopenia-platelets appear to have rebounded-will update lab  Renal insufficiency-creatinine most recently 1.11--it appears to be relatively baseline to improved Will update this.  Diabetes type 2-  His hemoglobin A1c actually is improving most recently 7.8-this is down from 12.36 months ago-has somewhat variable blood sugars as stated above especially later in the day--I discussed this with Dr. Leanord Hawking via phone and will DC the Lantus 8 units twice a day and change him to Lantus 12 units daily at bedtime-also will start NovoLog insulin 5 units at meals if CBG is greater than 150    Hypertension-again hydrochlorothiazide was  decreased secondary to renal issues he is also on lisinopril 30 mg a day --it appears he has fairly consistently elevated readings from chart review and what I obtained --will increase lisinopril to 40 mg a day and monitor   Question seizure disorder-he is on Depakote-Depakote level was 43 there've been no seizures during his stay here to my knowledge at this point will monitor-since he is stable we will not adjust Depakote dose at this time   Hyperlipidemia-he continues on a statin--recent liver function tests were within normal limits secondary to relatively advanced age somewhat conservative--at  this point will monitor  Dementia with behaviors-this appears to be stable apparently this was an issue at assisted living but his stay here behavior wise has been quite unremarkable he is pleasant I do not really note any issues per nursing  History of unresponsive  episodes-as noted above occasionally apparently he will have this with no etiology found he went to the ER about a month ago for this-lab work was unremarkable there been no further episodes-at  this point will monitor.     [id number] note greater than 35 minutes spent assessing patient-reviewing his chart-discussing his status with nursing staff-and coordinating and formulating a plan of care for numerous diagnoses-of note greater than 50% of time spent coordinating plan of care   .

## 2015-04-20 DIAGNOSIS — Z79899 Other long term (current) drug therapy: Secondary | ICD-10-CM | POA: Diagnosis not present

## 2015-04-20 LAB — CBC WITH DIFFERENTIAL/PLATELET
BASOS PCT: 0 % (ref 0–1)
Basophils Absolute: 0 10*3/uL (ref 0.0–0.1)
EOS PCT: 3 % (ref 0–5)
Eosinophils Absolute: 0.1 10*3/uL (ref 0.0–0.7)
HEMATOCRIT: 26.9 % — AB (ref 39.0–52.0)
Hemoglobin: 9.2 g/dL — ABNORMAL LOW (ref 13.0–17.0)
Lymphocytes Relative: 32 % (ref 12–46)
Lymphs Abs: 1.2 10*3/uL (ref 0.7–4.0)
MCH: 34.5 pg — ABNORMAL HIGH (ref 26.0–34.0)
MCHC: 34.2 g/dL (ref 30.0–36.0)
MCV: 100.7 fL — AB (ref 78.0–100.0)
MONO ABS: 0.4 10*3/uL (ref 0.1–1.0)
Monocytes Relative: 10 % (ref 3–12)
Neutro Abs: 2 10*3/uL (ref 1.7–7.7)
Neutrophils Relative %: 54 % (ref 43–77)
Platelets: 119 10*3/uL — ABNORMAL LOW (ref 150–400)
RBC: 2.67 MIL/uL — ABNORMAL LOW (ref 4.22–5.81)
RDW: 14.2 % (ref 11.5–15.5)
Smear Review: DECREASED
WBC: 3.6 10*3/uL — ABNORMAL LOW (ref 4.0–10.5)

## 2015-04-20 LAB — BASIC METABOLIC PANEL
ANION GAP: 6 (ref 5–15)
BUN: 32 mg/dL — AB (ref 6–20)
CO2: 31 mmol/L (ref 22–32)
Calcium: 8.2 mg/dL — ABNORMAL LOW (ref 8.9–10.3)
Chloride: 105 mmol/L (ref 101–111)
Creatinine, Ser: 1.2 mg/dL (ref 0.61–1.24)
GFR calc Af Amer: >60 mL/min (ref >60)
GFR, EST NON AFRICAN AMERICAN: 55 mL/min — AB (ref >60)
Glucose, Bld: 281 mg/dL — ABNORMAL HIGH (ref 65–99)
POTASSIUM: 3.5 mmol/L (ref 3.5–5.1)
SODIUM: 142 mmol/L (ref 135–145)

## 2015-04-21 DIAGNOSIS — Z79899 Other long term (current) drug therapy: Secondary | ICD-10-CM | POA: Diagnosis not present

## 2015-04-23 DIAGNOSIS — Z79899 Other long term (current) drug therapy: Secondary | ICD-10-CM | POA: Diagnosis not present

## 2015-04-23 LAB — COMPREHENSIVE METABOLIC PANEL
ALT: 63 U/L (ref 17–63)
AST: 73 U/L — AB (ref 15–41)
Albumin: 2.7 g/dL — ABNORMAL LOW (ref 3.5–5.0)
Alkaline Phosphatase: 59 U/L (ref 38–126)
Anion gap: 6 (ref 5–15)
BILIRUBIN TOTAL: 0.7 mg/dL (ref 0.3–1.2)
BUN: 32 mg/dL — ABNORMAL HIGH (ref 6–20)
CO2: 29 mmol/L (ref 22–32)
CREATININE: 1.18 mg/dL (ref 0.61–1.24)
Calcium: 8.1 mg/dL — ABNORMAL LOW (ref 8.9–10.3)
Chloride: 102 mmol/L (ref 101–111)
GFR calc Af Amer: >60 mL/min (ref >60)
GFR, EST NON AFRICAN AMERICAN: 56 mL/min — AB (ref >60)
Glucose, Bld: 250 mg/dL — ABNORMAL HIGH (ref 65–99)
POTASSIUM: 4 mmol/L (ref 3.5–5.1)
Sodium: 137 mmol/L (ref 135–145)
Total Protein: 5.5 g/dL — ABNORMAL LOW (ref 6.5–8.1)

## 2015-04-25 DIAGNOSIS — Z79899 Other long term (current) drug therapy: Secondary | ICD-10-CM | POA: Diagnosis not present

## 2015-04-25 LAB — BASIC METABOLIC PANEL
Anion gap: 7 (ref 5–15)
BUN: 35 mg/dL — AB (ref 6–20)
CALCIUM: 8.6 mg/dL — AB (ref 8.9–10.3)
CO2: 31 mmol/L (ref 22–32)
CREATININE: 1.25 mg/dL — AB (ref 0.61–1.24)
Chloride: 103 mmol/L (ref 101–111)
GFR, EST NON AFRICAN AMERICAN: 52 mL/min — AB (ref >60)
GLUCOSE: 151 mg/dL — AB (ref 65–99)
POTASSIUM: 3.6 mmol/L (ref 3.5–5.1)
Sodium: 141 mmol/L (ref 135–145)

## 2015-04-26 LAB — VITAMIN D 1,25 DIHYDROXY
VITAMIN D3 1, 25 (OH): 26 pg/mL
Vitamin D 1, 25 (OH)2 Total: 29 pg/mL

## 2015-04-29 ENCOUNTER — Other Ambulatory Visit (HOSPITAL_COMMUNITY)
Admission: RE | Admit: 2015-04-29 | Discharge: 2015-04-29 | Disposition: A | Discharge status: Home / Self Care | Payer: Medicare PPO | Source: Other Acute Inpatient Hospital | Attending: Internal Medicine | Admitting: Internal Medicine

## 2015-04-29 ENCOUNTER — Non-Acute Institutional Stay: Payer: Medicare PPO | Admitting: Internal Medicine

## 2015-04-29 DIAGNOSIS — D696 Thrombocytopenia, unspecified: Secondary | ICD-10-CM

## 2015-04-29 DIAGNOSIS — I10 Essential (primary) hypertension: Secondary | ICD-10-CM

## 2015-04-29 DIAGNOSIS — M79604 Pain in right leg: Secondary | ICD-10-CM | POA: Diagnosis not present

## 2015-04-29 DIAGNOSIS — E119 Type 2 diabetes mellitus without complications: Secondary | ICD-10-CM | POA: Diagnosis not present

## 2015-04-29 DIAGNOSIS — D509 Iron deficiency anemia, unspecified: Secondary | ICD-10-CM | POA: Diagnosis not present

## 2015-04-29 LAB — CBC
HEMATOCRIT: 30 % — AB (ref 39.0–52.0)
Hemoglobin: 9.9 g/dL — ABNORMAL LOW (ref 13.0–17.0)
MCH: 33.7 pg (ref 26.0–34.0)
MCHC: 33 g/dL (ref 30.0–36.0)
MCV: 102 fL — AB (ref 78.0–100.0)
Platelets: 153 10*3/uL (ref 150–400)
RBC: 2.94 MIL/uL — AB (ref 4.22–5.81)
RDW: 14.1 % (ref 11.5–15.5)
WBC: 4.9 10*3/uL (ref 4.0–10.5)

## 2015-04-29 LAB — BASIC METABOLIC PANEL
Anion gap: 8 (ref 5–15)
BUN: 27 mg/dL — AB (ref 6–20)
CO2: 29 mmol/L (ref 22–32)
Calcium: 8.2 mg/dL — ABNORMAL LOW (ref 8.9–10.3)
Chloride: 101 mmol/L (ref 101–111)
Creatinine, Ser: 1.26 mg/dL — ABNORMAL HIGH (ref 0.61–1.24)
GFR, EST AFRICAN AMERICAN: 60 mL/min — AB (ref >60)
GFR, EST NON AFRICAN AMERICAN: 52 mL/min — AB (ref >60)
GLUCOSE: 224 mg/dL — AB (ref 65–99)
Potassium: 4.7 mmol/L (ref 3.5–5.1)
Sodium: 138 mmol/L (ref 135–145)

## 2015-04-29 NOTE — Progress Notes (Signed)
Patient ID: Todd [name], male   DOB: 1932/02/03, 81 y.o.   MRN: [id number]       ACILITY: [facility]    LEVEL OF CARE:   SNF  This is an acute visit   CHIEF COMPLAINT:  Acute visit follow-up possible right leg pain-follow-up diabetes type 2     HISTORY OF PRESENT ILLNESS:   Patient is a pleasant elderly male with weakness  here for rehabilitation and monitoring--he had been hospitalized for altered mental status lethargy which apparently improved during  hospitalization he was empirically started apparently on Depakote for concerns he may be having seizures. After his discharge here He was found recently to have increased edema of his left lower leg and  Doppler ultrasound was positive for DVT nonocclusive thrombus  on the proximal and distal left femoral vein and within the left popliteal vein-initially was started on Lovenox-he has now been transferred to Xarelto.  Clinically this appears to be improved there is still some residual edema of the left leg but this does not appear to be uncomfortable  Nursing earlier today thought patient was complaining of increased right leg pain.  There's been no recent history of trauma --when I did evaluate him with the nurse later in the day he was denying any pain here       He also has a history of diabetes type 2 h One point he was on twice-daily dosing of Lantus 8 units each dose-this was simplified to 12 units daily and his blood sugars appear to be more stable morning sugars recently have ranged from 84-1 49 at noon largely in the mid 100s this is also the case at 4 PM A there is a reading above 200 but this is not frequent--- at bedtime readings are still most elevated but have moderated now in the mid low 200s at times had been in the 300s previously.       Today patient has no complaints he is a poor historian secondary to dementia but nursing staff reports no new issues other than the right leg pain which appears to  have resolved     y       PAST MEDICAL HISTORY/PROBLEM LIST:                  Recurrent syncope.  Note that he had an admission from 08/18/2014 through 08/20/2014 for this issue, also 08/31/2014 through 09/01/2014 with altered mental status, 10/03/2014 through 10/05/2014 for ?TIA symptoms.  He has also presented with an unresponsive episode.    Hypertension.    Type 2 diabetes.  previously  Poorly controlled with a hemoglobin A1c on 10/03/2014 of 12.3.  On home Lantus.--This has improved to 7.8 on lab done on 04-08-15    Dementia.    Chronic kidney disease with a baseline creatinine of 1.5.--Most recently 1.25 on June 27    Coronary artery disease.   Status post CABG.    Hyperlipidemia.    CURRENT MEDICATIONS:  Discharge medications include:       .    Lipitor 80 q.d.     Depakote 500 q.12.    Hydrochlorothiazide 12.5 q.d.    Lantus insulin 12 units daily at bedtime.    Lisinopril 30 mg .    Vitamin B12, 1000 U daily.    Vitamin D3, 1000 U daily.  Potassium 20 mEq a day  Xarelto  20 milligrams daily    SOCIAL HISTORY:  HOUSING:  Patient previously lived at assisted living [facility]-and apparently there are plans to return there if possible sometime in the near future.   CODE STATUS:  He does come with a DNR status.    FAMILY HISTORY:   Not currently available.    REVIEW OF SYSTEMS:--Limited secondary to dementia-per nursing complain of some right leg pain earlier today but does not complain of that currently there is been no complaints of shortness breath or chest pain has been at baseline per nursing    -.   .    PHYSICAL EXAMINATION:   Temperature 97.0 pulse 60 respirations 16 blood pressure 126/77 g  GENERAL APPEARANCE:  The patient is not in any distress. He does speak but does not talk a lot  Probably on the severe side of dementia, but no evidence of delirium he is pleasant and follows simple verbal commands.  His skin is warm  and dry.--No increased bruising or bleeding noted  Eyes pupils appear reactive light sclerae   clear --visual acuity appears grossly intact CHEST/RESPIRATORY:  Clear air entry bilaterally-no -labored breathing.   CARDIOVASCULAR:  CARDIAC:   Heart sounds are normal with no carotid bruits l1-2/6 systolic murmur  Regular  rate and rhythm.--Has some baseline lower extremity edema bit more in the left versus the right--pedal pulses intact   GASTROINTESTINAL:  ABDOMEN:   Soft, nontender.  No masses. Positive bowel sounds   . [name]. Musculoskeletal-moves all extremities 4 --- the edema on the left leg is present -- there is no significant  warmth or tenderness to palpation here  --pedal pulses somewhat reduced Right leg I do not see any deformities or changes from baseline there is no pain with palpation of the leg range of motion at the hip or knee- does not elicit any pain-there is no erythema or tenderness or increased edema or warmth SENSATION/STRENGTH:  Strength is normal--he is ambulatory moves all extremities at baseline I did not note any deformities.   Neurologic speech is clear I do not see any lateralizing findings  Psych He is oriented to self will follow simple verbal commands---continues to be pleasant and cooperative during his stay here       Labs  04/25/2015.  Sodium 141 potassium 3.6 BUN 35 creatinine 1.25-albumen 2.7 AST 73 otherwise liver function tests within normal limits.  04/20/2015.  WBC 3.6 hemoglobin 9.2 platelets 119  04/08/2015.  Valproic acid 43.  Hemoglobin A1c 7.8 which actually is improvement from 3 months ago when it was 9.2 and 6 months ago when it was 12.3.  03/16/2015.  WBC 5.9 hemoglobin 10.2 platelets 161.  03/23/2015.  Sodium 142 potassium 3.8 BUN 20 creatinine 1.17.  Alb-3.2-otherwise liver function tests within normal limits.    12/23/2014.  WBC 4.5 hemoglobin 10.9 platelets 162.  Sodium 138 potassium 4.3 BUN 28 creatinine  1.19.  Liver function tests within normal limits  Cholesterol 189 triglycerides 101 HDL 44 LDL 125.  Hemoglobin A1c 9.2 Depakote level LXVII.6  .  11/29/2014.  WBC 4.2 hemoglobin 11.7 platelets 182  Nov 26 2013.  WBC 4.3 hemoglobin 10.5 platelets 120,000.  11/25/2013.  WBC 4.2 hemoglobin 10.3 platelets 108.  Valproic acid level LXXVI.5.  Sodium 139 potassium 4.5 BUN 32 creatinine 1.4  11/01/2014.  Valproic acid level LXXXVIII.  10/28/2014.  WBC 4.4 hemoglobin 10.5 platelets 175.  Sodium 142 potassium 3.7 BUN 25 creatinine 1.18.  Magnesium level II.0.  Depakote level XCVI.5.    10/27/2014.  Sodium 141 potassium 2.9 BUN 22  creatinine 1.11.  Depakote level 93.8.  10/16/2014.  WBC 10.1 hemoglobin 8.6 platelets 187.  Liver function tests within normal limits except albumin of 3.3.    .    ASSESSMENT/PLAN:    Right leg pain-this appears resolved physical exam was quite benign at this point will continue to monitor   Left lower leg edema-with recent history of left leg DVT-again he is on anticoagulation with Xarelto-this appears to be  improved clinically he appears stable.                                                      Thrombocytopenia-platelets appear to have rebounded-will update lab  Renal insufficiency--creatinine appears to be stable at 1.25 BUN is 35 .  Diabetes type 2-  His hemoglobin A1c actually is improving most recently 7.8-t We have changed his Lantus to daily 12 units-blood sugars appear to be stabilizing at this point will monitor    Hypertension-recently hydrochlorothiazide was  decreased secondary to renal issues--blood pressures were noted be somewhat elevated and we increased his lisinopril to 40 mg a day-recent blood pressure shows stability 122/57-126/77  Anemia-appears hemoglobin has dropped a bit to 9.20 most recent lab on June 22 this may be a lab variation would like to recheck this.  Addendum.  We have  obtained the updated CBC and it does show improvement with a hemoglobin of 9.9 which is close to his baseline platelets are also improved at 153 .  -he does have some history of mild thrombocytopenia   [id number]             .

## 2015-05-01 ENCOUNTER — Encounter: Payer: Self-pay | Admitting: Internal Medicine

## 2015-05-01 DIAGNOSIS — M25569 Pain in unspecified knee: Secondary | ICD-10-CM | POA: Insufficient documentation

## 2015-05-23 ENCOUNTER — Encounter (HOSPITAL_COMMUNITY)
Admission: RE | Admit: 2015-05-23 | Discharge: 2015-05-23 | Disposition: A | Discharge status: Home / Self Care | Payer: Medicare PPO | Source: Skilled Nursing Facility | Attending: Internal Medicine | Admitting: Internal Medicine

## 2015-05-23 DIAGNOSIS — Z87891 Personal history of nicotine dependence: Secondary | ICD-10-CM | POA: Insufficient documentation

## 2015-05-23 DIAGNOSIS — Z794 Long term (current) use of insulin: Secondary | ICD-10-CM | POA: Insufficient documentation

## 2015-05-23 DIAGNOSIS — E119 Type 2 diabetes mellitus without complications: Secondary | ICD-10-CM | POA: Diagnosis present

## 2015-05-23 DIAGNOSIS — I10 Essential (primary) hypertension: Secondary | ICD-10-CM | POA: Diagnosis present

## 2015-05-23 LAB — COMPREHENSIVE METABOLIC PANEL
ALT: 46 U/L (ref 17–63)
AST: 44 U/L — ABNORMAL HIGH (ref 15–41)
Albumin: 3 g/dL — ABNORMAL LOW (ref 3.5–5.0)
Alkaline Phosphatase: 54 U/L (ref 38–126)
Anion gap: 6 (ref 5–15)
BILIRUBIN TOTAL: 0.5 mg/dL (ref 0.3–1.2)
BUN: 31 mg/dL — AB (ref 6–20)
CO2: 30 mmol/L (ref 22–32)
Calcium: 8.4 mg/dL — ABNORMAL LOW (ref 8.9–10.3)
Chloride: 105 mmol/L (ref 101–111)
Creatinine, Ser: 1.31 mg/dL — ABNORMAL HIGH (ref 0.61–1.24)
GFR, EST AFRICAN AMERICAN: 57 mL/min — AB (ref >60)
GFR, EST NON AFRICAN AMERICAN: 49 mL/min — AB (ref >60)
GLUCOSE: 93 mg/dL (ref 65–99)
POTASSIUM: 4.3 mmol/L (ref 3.5–5.1)
Sodium: 141 mmol/L (ref 135–145)
Total Protein: 5.9 g/dL — ABNORMAL LOW (ref 6.5–8.1)

## 2015-05-23 LAB — CBC WITH DIFFERENTIAL/PLATELET
BASOS PCT: 0 % (ref 0–1)
Basophils Absolute: 0 10*3/uL (ref 0.0–0.1)
EOS PCT: 2 % (ref 0–5)
Eosinophils Absolute: 0.1 10*3/uL (ref 0.0–0.7)
HCT: 33.4 % — ABNORMAL LOW (ref 39.0–52.0)
HEMOGLOBIN: 11 g/dL — AB (ref 13.0–17.0)
LYMPHS ABS: 1.5 10*3/uL (ref 0.7–4.0)
LYMPHS PCT: 45 % (ref 12–46)
MCH: 33.6 pg (ref 26.0–34.0)
MCHC: 32.9 g/dL (ref 30.0–36.0)
MCV: 102.1 fL — ABNORMAL HIGH (ref 78.0–100.0)
Monocytes Absolute: 0.4 10*3/uL (ref 0.1–1.0)
Monocytes Relative: 11 % (ref 3–12)
NEUTROS ABS: 1.4 10*3/uL — AB (ref 1.7–7.7)
Neutrophils Relative %: 42 % — ABNORMAL LOW (ref 43–77)
Platelets: 107 10*3/uL — ABNORMAL LOW (ref 150–400)
RBC: 3.27 MIL/uL — AB (ref 4.22–5.81)
RDW: 13.8 % (ref 11.5–15.5)
Smear Review: DECREASED
WBC: 3.3 10*3/uL — ABNORMAL LOW (ref 4.0–10.5)

## 2015-05-23 LAB — BILIRUBIN, DIRECT: BILIRUBIN DIRECT: 0.1 mg/dL (ref 0.1–0.5)

## 2015-05-24 LAB — VITAMIN D 25 HYDROXY (VIT D DEFICIENCY, FRACTURES): VIT D 25 HYDROXY: 41.3 ng/mL (ref 30.0–100.0)

## 2015-06-01 ENCOUNTER — Encounter (HOSPITAL_COMMUNITY)
Admission: AD | Admit: 2015-06-01 | Discharge: 2015-06-01 | Disposition: A | Discharge status: Home / Self Care | Payer: Medicare PPO | Source: Skilled Nursing Facility | Attending: Internal Medicine | Admitting: Internal Medicine

## 2015-06-01 DIAGNOSIS — I129 Hypertensive chronic kidney disease with stage 1 through stage 4 chronic kidney disease, or unspecified chronic kidney disease: Secondary | ICD-10-CM | POA: Insufficient documentation

## 2015-06-01 DIAGNOSIS — N183 Chronic kidney disease, stage 3 (moderate): Secondary | ICD-10-CM | POA: Diagnosis not present

## 2015-06-01 LAB — CBC
HEMATOCRIT: 34.3 % — AB (ref 39.0–52.0)
Hemoglobin: 11.4 g/dL — ABNORMAL LOW (ref 13.0–17.0)
MCH: 33.3 pg (ref 26.0–34.0)
MCHC: 33.2 g/dL (ref 30.0–36.0)
MCV: 100.3 fL — AB (ref 78.0–100.0)
Platelets: 127 10*3/uL — ABNORMAL LOW (ref 150–400)
RBC: 3.42 MIL/uL — AB (ref 4.22–5.81)
RDW: 13.5 % (ref 11.5–15.5)
WBC: 4.1 10*3/uL (ref 4.0–10.5)

## 2015-06-08 ENCOUNTER — Non-Acute Institutional Stay: Payer: Medicare PPO | Admitting: Internal Medicine

## 2015-06-08 ENCOUNTER — Encounter: Payer: Self-pay | Admitting: Internal Medicine

## 2015-06-08 DIAGNOSIS — F0391 Unspecified dementia with behavioral disturbance: Secondary | ICD-10-CM

## 2015-06-08 DIAGNOSIS — N189 Chronic kidney disease, unspecified: Secondary | ICD-10-CM

## 2015-06-08 DIAGNOSIS — E119 Type 2 diabetes mellitus without complications: Secondary | ICD-10-CM

## 2015-06-08 DIAGNOSIS — D696 Thrombocytopenia, unspecified: Secondary | ICD-10-CM

## 2015-06-08 DIAGNOSIS — I82409 Acute embolism and thrombosis of unspecified deep veins of unspecified lower extremity: Secondary | ICD-10-CM

## 2015-06-08 DIAGNOSIS — R609 Edema, unspecified: Secondary | ICD-10-CM

## 2015-06-08 DIAGNOSIS — R634 Abnormal weight loss: Secondary | ICD-10-CM | POA: Diagnosis not present

## 2015-06-09 DIAGNOSIS — N183 Chronic kidney disease, stage 3 (moderate): Secondary | ICD-10-CM | POA: Diagnosis not present

## 2015-06-09 LAB — CBC WITH DIFFERENTIAL/PLATELET
Basophils Absolute: 0 10*3/uL (ref 0.0–0.1)
Basophils Absolute: 0 10*3/uL (ref 0.0–0.1)
Basophils Relative: 0 % (ref 0–1)
Basophils Relative: 0 % (ref 0–1)
EOS PCT: 2 % (ref 0–5)
Eosinophils Absolute: 0.1 10*3/uL (ref 0.0–0.7)
Eosinophils Absolute: 0.1 10*3/uL (ref 0.0–0.7)
Eosinophils Relative: 2 % (ref 0–5)
HCT: 25.1 % — ABNORMAL LOW (ref 39.0–52.0)
HCT: 31.3 % — ABNORMAL LOW (ref 39.0–52.0)
HEMOGLOBIN: 8.4 g/dL — AB (ref 13.0–17.0)
HEMOGLOBIN: 10.3 g/dL — AB (ref 13.0–17.0)
LYMPHS ABS: 2 10*3/uL (ref 0.7–4.0)
LYMPHS PCT: 32 % (ref 12–46)
Lymphocytes Relative: 45 % (ref 12–46)
Lymphs Abs: 1.6 10*3/uL (ref 0.7–4.0)
MCH: 34.7 pg — ABNORMAL HIGH (ref 26.0–34.0)
MCH: 33.3 pg (ref 26.0–34.0)
MCHC: 33.5 g/dL (ref 30.0–36.0)
MCHC: 32.9 g/dL (ref 30.0–36.0)
MCV: 103.7 fL — AB (ref 78.0–100.0)
MCV: 101.3 fL — ABNORMAL HIGH (ref 78.0–100.0)
MONO ABS: 0.5 10*3/uL (ref 0.1–1.0)
MONO ABS: 0.6 10*3/uL (ref 0.1–1.0)
MONOS PCT: 11 % (ref 3–12)
MONOS PCT: 13 % — AB (ref 3–12)
NEUTROS ABS: 1.9 10*3/uL (ref 1.7–7.7)
Neutro Abs: 2.6 10*3/uL (ref 1.7–7.7)
Neutrophils Relative %: 42 % — ABNORMAL LOW (ref 43–77)
Neutrophils Relative %: 53 % (ref 43–77)
Platelets: 87 10*3/uL — ABNORMAL LOW (ref 150–400)
Platelets: 121 10*3/uL — ABNORMAL LOW (ref 150–400)
RBC: 2.42 MIL/uL — ABNORMAL LOW (ref 4.22–5.81)
RBC: 3.09 MIL/uL — AB (ref 4.22–5.81)
RDW: 13.6 % (ref 11.5–15.5)
RDW: 14 % (ref 11.5–15.5)
WBC: 4.4 10*3/uL (ref 4.0–10.5)
WBC: 4.9 10*3/uL (ref 4.0–10.5)

## 2015-06-09 LAB — COMPREHENSIVE METABOLIC PANEL
ALBUMIN: 2.9 g/dL — AB (ref 3.5–5.0)
ALT: 96 U/L — ABNORMAL HIGH (ref 17–63)
AST: 140 U/L — ABNORMAL HIGH (ref 15–41)
Alkaline Phosphatase: 61 U/L (ref 38–126)
Anion gap: 9 (ref 5–15)
BUN: 32 mg/dL — ABNORMAL HIGH (ref 6–20)
CALCIUM: 8.6 mg/dL — AB (ref 8.9–10.3)
CO2: 26 mmol/L (ref 22–32)
CREATININE: 1.23 mg/dL (ref 0.61–1.24)
Chloride: 105 mmol/L (ref 101–111)
GFR calc Af Amer: >60 mL/min (ref >60)
GFR calc non Af Amer: 53 mL/min — ABNORMAL LOW (ref >60)
Glucose, Bld: 125 mg/dL — ABNORMAL HIGH (ref 65–99)
Potassium: 4 mmol/L (ref 3.5–5.1)
Sodium: 140 mmol/L (ref 135–145)
Total Bilirubin: 0.6 mg/dL (ref 0.3–1.2)
Total Protein: 5.8 g/dL — ABNORMAL LOW (ref 6.5–8.1)

## 2015-06-09 LAB — VALPROIC ACID LEVEL: Valproic Acid Lvl: 80 ug/mL (ref 50.0–100.0)

## 2015-06-10 ENCOUNTER — Other Ambulatory Visit (HOSPITAL_COMMUNITY)
Admission: AD | Admit: 2015-06-10 | Discharge: 2015-06-10 | Disposition: A | Discharge status: Home / Self Care | Payer: Medicare PPO | Source: Skilled Nursing Facility | Attending: Internal Medicine | Admitting: Internal Medicine

## 2015-06-10 ENCOUNTER — Encounter: Payer: Self-pay | Admitting: Internal Medicine

## 2015-06-10 ENCOUNTER — Non-Acute Institutional Stay: Payer: Medicare PPO | Admitting: Internal Medicine

## 2015-06-10 DIAGNOSIS — R451 Restlessness and agitation: Secondary | ICD-10-CM | POA: Diagnosis not present

## 2015-06-10 DIAGNOSIS — F0391 Unspecified dementia with behavioral disturbance: Secondary | ICD-10-CM | POA: Diagnosis not present

## 2015-06-10 DIAGNOSIS — R748 Abnormal levels of other serum enzymes: Secondary | ICD-10-CM

## 2015-06-10 DIAGNOSIS — D539 Nutritional anemia, unspecified: Secondary | ICD-10-CM | POA: Diagnosis not present

## 2015-06-10 DIAGNOSIS — N390 Urinary tract infection, site not specified: Secondary | ICD-10-CM | POA: Insufficient documentation

## 2015-06-10 LAB — URINALYSIS W MICROSCOPIC (NOT AT ARMC)
Bilirubin Urine: NEGATIVE
GLUCOSE, UA: NEGATIVE mg/dL
LEUKOCYTES UA: NEGATIVE
NITRITE: NEGATIVE
Specific Gravity, Urine: 1.015 (ref 1.005–1.030)
Urobilinogen, UA: 1 mg/dL (ref 0.0–1.0)
pH: 6 (ref 5.0–8.0)

## 2015-06-10 NOTE — Progress Notes (Signed)
Patient ID: Todd [name], male   DOB: 1931/11/10, 81 y.o.   MRN: [id number]     This is an acute visit.  Level care skilled.  Facility [facility]   CHIEF COMPLAINT: Acute visit secondary to abnormal liver function tests and dropping hemoglobin     HISTORY OF PRESENT ILLNESS:  Patient is a pleasant elderly male with weakness here for rehabilitation and monitoring--he had been hospitalized for altered mental status lethargy which apparently improved during hospitalization he was empirically started apparently on Depakote for concerns he may be having seizures. After his discharge here He was found lst January  to have increased edema of his left lower leg and Doppler ultrasound was positive for DVT nonocclusive thrombus on the proximal and distal left femoral vein and within the left popliteal vein-initially was started on Lovenox- and was transitioned to Xarelto-this has been discontinued secondary to greater than  six  months since his DVT.  Clinically this appears to be improved there is still some residual edema of the left leg but this does not appear to be uncomfortable  This is complicated somewhat with a history of thrombocytopenia   During last visit earlier this  week  was noted his lost about 10 pounds over the past month although apparently he eats well.  Labs were ordered that came back yesterday-his liver function tests are elevated AST is 140 ALT 96 on July 25 his AST was 44 ALT was 46  Albumin is 2.9.--- I was called about this yesterday and it was decided to hold his atorvastatin I do note he is also on Depakote  He also has a dropped hemoglobin of 8.4 baseline is more in the 10--11 range previously.-- this is a macrocytic anemia  There've been no reports of increased bleeding or bruising.    .    y      PAST MEDICAL HISTORY/PROBLEM LIST:   Recurrent syncope. Note that he had an admission from 08/18/2014 through 08/20/2014 for  this issue, also 08/31/2014 through 09/01/2014 with altered mental status, 10/03/2014 through 10/05/2014 for ?TIA symptoms. He has also presented with an unresponsive episode.   Hypertension.   Type 2 diabetes. previously Poorly controlled with a hemoglobin A1c on 10/03/2014 of 12.3. On home Lantus.--This has improved to 7.8 on lab done on 04-08-15   Dementia.   Chronic kidney disease with a baseline creatinine of 1.5.   Coronary artery disease. Status post CABG.   Hyperlipidemia.  CURRENT MEDICATIONS: Discharge medications include: .   Lipitor 80 q.d.   Depakote 500 q.12.   Hydrochlorothiazide 12.5 q.d.   Lantus 12 units daily at bedtime.   Lisinopril 30 mg .   Vitamin B12, 1000 U daily.   Vitamin D3, 1000 U daily.  Potassium 20 mEq a da  SOCIAL HISTORY:  HOUSING: Patient previously lived at assisted living [facility]-and apparently there are plans to return there if possible sometime in the near future.  CODE STATUS: He does come with a DNR status.   FAMILY HISTORY: Not currently available.   REVIEW OF SYSTEMS:--Limited secondary to dementiaper nursing staff is stay here has been quite uneventful with no acute complaints not complaining of leg pain shortness of breath chest pain there has been no increased bruising or bleeding noted--  staff has noticed some increased agitation which they say is unlike him -apparently there was significant agitation at previous facility but this is been quite stable here  In general no complaints fever or chills.  Skin-no rashes  or itching noted.  Head ears eyes nose mouth and throat-does not complain of any sore throat visual changes.  Cardiac no chest Pain-- mild left lower extremity edema-this has been chronic.  Respiratory-no cough or shortness of breath noted.  GI-apparently eats well does not complaining of abdominal discomfort nausea or vomiting diarrhea or  constipation.  Musculoskeletal-apparently joint pain has not been an issue --is not really complaining of any pain in his left leg.  Neurologic again no syncopal episodes have been noted.  Psych he does have significant dementia but behaviors have been well-controlled he's been pleasant and cooperative-. When I seen him-- but according nursing staff he has increased periods of agitation   .   PHYSICAL EXAMINATION:     temperature 98.0 pulse 64 respirations 20 blood pressure 121/64 O2 saturation is 100% on room air  GENERAL APPEARANCE: The patient is not in any distress. He does speak but does not talk a lot Probably on the severe side of dementia, but no evidence of delirium he is pleasant and follows simple verbal commands.  His skin is warm and dry.--No increased bruising or bleeding noted--  still has some finger edema that was noted earlier this week but this is cool to touch nontender appears actually be somewhat improved  Eyes pupils appear reactive light sclerae clear --visual acuity appears grossly intact CHEST/RESPIRATORY: Clear air entry bilaterally-no -labored breathing.  CARDIOVASCULAR:  CARDIAC: Heart sounds are normal with no carotid bruits K4-4/0 systolic murmur Regular rate and rhythm.--Has some baseline lower extremity edema bit more in the left versus the right--pedal pulses intact  GASTROINTESTINAL:  ABDOMEN: Soft, nontender. No masses. Positive bowel sounds   . [name] Musculoskeletal-moves all extremities 4 --- the edema on the left leg is present -- there is no significant warmth or tenderness to palpation here --pedal pulses somewhat reduced SENSATION/STRENGTH: Strength is normal--he is ambulatory moves all extremities at baseline I did not note any deformities. Grip strength is strong bilaterally    Neurologic speech is clear I do not see any lateralizing findings  Psych He is oriented to self will follow simple verbal commands---continues  to be pleasant and cooperative during his stay here      Labs   06/09/2015.    WBC 4.4 hemoglobin 8.4 platelets 87,000.   sodium 140 potassium 4 BUN 32 creatinine 1.23.   albumin 2.9- AST 140-ALT 96 - otherwise liver function tests within normal limits   06/01/2015.  WBC 4.1 hemoglobin 11.4 platelets 127.  05/26/2015.  Sodium 141 potassium 4.3 BUN 31 creatinine 1.31.  AST 44-albumin 3.0 otherwise liver function tests within normal limits  04/08/2015.  Valproic acid 43.  Hemoglobin A1c 7.8 which actually is improvement from 3 months ago when it was 9.2 and 6 months ago when it was 12.3.  03/16/2015.  WBC 5.9 hemoglobin 10.2 platelets 161.  03/23/2015.  Sodium 142 potassium 3.8 BUN 20 creatinine 1.17.  Alb-3.2-otherwise liver function tests within normal limits.    12/23/2014.  WBC 4.5 hemoglobin 10.9 platelets 162.  Sodium 138 potassium 4.3 BUN 28 creatinine 1.19.  Liver function tests within normal limits  Cholesterol 189 triglycerides 101 HDL 44 LDL 125.  Hemoglobin A1c 9.2 Depakote level LXVII.6  .  11/29/2014.  WBC 4.2 hemoglobin 11.7 platelets 182  Nov 26 2013.  WBC 4.3 hemoglobin 10.5 platelets 120,000.  11/25/2013.  WBC 4.2 hemoglobin 10.3 platelets 108.  Valproic acid level LXXVI.5.  Sodium 139 potassium 4.5 BUN 32 creatinine 1.4  11/01/2014.  Valproic acid  level LXXXVIII.  10/28/2014.  WBC 4.4 hemoglobin 10.5 platelets 175.  Sodium 142 potassium 3.7 BUN 25 creatinine 1.18.  Magnesium level II.0.  Depakote level XCVI.5.    10/27/2014.  Sodium 141 potassium 2.9 BUN 22 creatinine 1.11.  Depakote level 93.8.  10/16/2014.  WBC 10.1 hemoglobin 8.6 platelets 187.  Liver function tests within normal limits except albumin of 3.3.    .   ASSESSMENT/PLAN   #1-elevated liver function tests- at this point will hold the atorvastatin -I do note he is also on Depakote apparently for seizure disorder although there  is some question about this.   We will update labs tomorrow to assess accuracy of these labs-- at some point consider discontinuing the Depakote.  #2 anemia hemoglobin has dropped fairly significantly down to 8.4  -will update a stat CBC to make sure this is accurate--  he does not show any evidence of bleeding does not appear to have a history of rectal bleeding --Xarelto has been discontinued secondary to greater than 6 months since his DVT.   #3 increased agitation intermittent- nursing staff reports patient is being somewhat noncompliant agitated more which is not his baseline recently-will order a UA CNS -he actually was pleasant cooperative with me during exam but apparently note is not always the case.   at this point monitor vital signs pulse ox every shift to keep an eye on him generally especially with the abnormal labs.    addendum-updated CBC shows improvement in hemoglobin back to nearbaseline at 10.3 -platelets are improved-at 121,000 at this point will monitor-- I suspect earlier lab was more of a lab variation update lab appears to be more baseline with previous labs   [id number]-- of note greater than 40 minutes spent assessing patient-discussing his status with nursing staff-reviewing his chart-and coordinating and formulating a plan of care- including phone time involved addressing labs yesterday  :

## 2015-06-11 DIAGNOSIS — N183 Chronic kidney disease, stage 3 (moderate): Secondary | ICD-10-CM | POA: Diagnosis not present

## 2015-06-11 LAB — COMPREHENSIVE METABOLIC PANEL
ALT: 92 U/L — AB (ref 17–63)
AST: 128 U/L — ABNORMAL HIGH (ref 15–41)
Albumin: 2.8 g/dL — ABNORMAL LOW (ref 3.5–5.0)
Alkaline Phosphatase: 53 U/L (ref 38–126)
Anion gap: 7 (ref 5–15)
BUN: 29 mg/dL — ABNORMAL HIGH (ref 6–20)
CO2: 31 mmol/L (ref 22–32)
Calcium: 8.5 mg/dL — ABNORMAL LOW (ref 8.9–10.3)
Chloride: 103 mmol/L (ref 101–111)
Creatinine, Ser: 1.25 mg/dL — ABNORMAL HIGH (ref 0.61–1.24)
GFR calc Af Amer: >60 mL/min (ref >60)
GFR calc non Af Amer: 52 mL/min — ABNORMAL LOW (ref >60)
Glucose, Bld: 153 mg/dL — ABNORMAL HIGH (ref 65–99)
Potassium: 4 mmol/L (ref 3.5–5.1)
Sodium: 141 mmol/L (ref 135–145)
Total Bilirubin: 0.7 mg/dL (ref 0.3–1.2)
Total Protein: 5.7 g/dL — ABNORMAL LOW (ref 6.5–8.1)

## 2015-06-11 LAB — FOLATE: Folate: 5.4 ng/mL — ABNORMAL LOW (ref >5.9)

## 2015-06-11 LAB — TSH: TSH: 3.831 u[IU]/mL (ref 0.350–4.500)

## 2015-06-13 ENCOUNTER — Encounter (HOSPITAL_COMMUNITY)
Admission: RE | Admit: 2015-06-13 | Discharge: 2015-06-13 | Disposition: A | Discharge status: Home / Self Care | Payer: Medicare PPO | Source: Skilled Nursing Facility | Attending: Internal Medicine | Admitting: Internal Medicine

## 2015-06-13 ENCOUNTER — Non-Acute Institutional Stay: Payer: Medicare PPO | Admitting: Internal Medicine

## 2015-06-13 DIAGNOSIS — D539 Nutritional anemia, unspecified: Secondary | ICD-10-CM | POA: Diagnosis not present

## 2015-06-13 DIAGNOSIS — N183 Chronic kidney disease, stage 3 (moderate): Secondary | ICD-10-CM | POA: Diagnosis not present

## 2015-06-13 DIAGNOSIS — D696 Thrombocytopenia, unspecified: Secondary | ICD-10-CM

## 2015-06-13 LAB — COMPREHENSIVE METABOLIC PANEL
ALBUMIN: 2.7 g/dL — AB (ref 3.5–5.0)
ALK PHOS: 44 U/L (ref 38–126)
ALT: 79 U/L — ABNORMAL HIGH (ref 17–63)
AST: 73 U/L — ABNORMAL HIGH (ref 15–41)
Anion gap: 6 (ref 5–15)
BUN: 37 mg/dL — ABNORMAL HIGH (ref 6–20)
CALCIUM: 8.6 mg/dL — AB (ref 8.9–10.3)
CO2: 31 mmol/L (ref 22–32)
Chloride: 106 mmol/L (ref 101–111)
Creatinine, Ser: 1.28 mg/dL — ABNORMAL HIGH (ref 0.61–1.24)
GFR calc Af Amer: 59 mL/min — ABNORMAL LOW (ref >60)
GFR calc non Af Amer: 51 mL/min — ABNORMAL LOW (ref >60)
GLUCOSE: 154 mg/dL — AB (ref 65–99)
POTASSIUM: 3.9 mmol/L (ref 3.5–5.1)
SODIUM: 143 mmol/L (ref 135–145)
Total Bilirubin: 0.6 mg/dL (ref 0.3–1.2)
Total Protein: 5.4 g/dL — ABNORMAL LOW (ref 6.5–8.1)

## 2015-06-13 LAB — CBC WITH DIFFERENTIAL/PLATELET
BASOS ABS: 0 10*3/uL (ref 0.0–0.1)
BASOS PCT: 0 % (ref 0–1)
Eosinophils Absolute: 0.1 10*3/uL (ref 0.0–0.7)
Eosinophils Relative: 1 % (ref 0–5)
HCT: 27.2 % — ABNORMAL LOW (ref 39.0–52.0)
HEMOGLOBIN: 9 g/dL — AB (ref 13.0–17.0)
Lymphocytes Relative: 37 % (ref 12–46)
Lymphs Abs: 1.8 10*3/uL (ref 0.7–4.0)
MCH: 33.3 pg (ref 26.0–34.0)
MCHC: 33.1 g/dL (ref 30.0–36.0)
MCV: 100.7 fL — ABNORMAL HIGH (ref 78.0–100.0)
Monocytes Absolute: 0.6 10*3/uL (ref 0.1–1.0)
Monocytes Relative: 12 % (ref 3–12)
NEUTROS PCT: 50 % (ref 43–77)
Neutro Abs: 2.4 10*3/uL (ref 1.7–7.7)
Platelets: 122 10*3/uL — ABNORMAL LOW (ref 150–400)
RBC: 2.7 MIL/uL — ABNORMAL LOW (ref 4.22–5.81)
RDW: 14.4 % (ref 11.5–15.5)
WBC: 4.8 10*3/uL (ref 4.0–10.5)

## 2015-06-13 LAB — VITAMIN B12: Vitamin B-12: 2874 pg/mL — ABNORMAL HIGH (ref 180–914)

## 2015-06-13 LAB — RETICULOCYTES
RBC.: 2.7 MIL/uL — ABNORMAL LOW (ref 4.22–5.81)
RETIC COUNT ABSOLUTE: 24.3 10*3/uL (ref 19.0–186.0)
Retic Ct Pct: 0.9 % (ref 0.4–3.1)

## 2015-06-13 LAB — FERRITIN: Ferritin: 230 ng/mL (ref 24–336)

## 2015-06-13 LAB — URINE CULTURE

## 2015-06-13 LAB — TSH: TSH: 2.833 u[IU]/mL (ref 0.350–4.500)

## 2015-06-13 LAB — FOLATE: Folate: 5.6 ng/mL — ABNORMAL LOW (ref >5.9)

## 2015-06-13 LAB — IRON AND TIBC
IRON: 52 ug/dL (ref 45–182)
Saturation Ratios: 24 % (ref 17.9–39.5)
TIBC: 220 ug/dL — AB (ref 250–450)
UIBC: 168 ug/dL

## 2015-06-14 ENCOUNTER — Non-Acute Institutional Stay: Payer: Medicare PPO | Admitting: Internal Medicine

## 2015-06-14 ENCOUNTER — Encounter: Payer: Self-pay | Admitting: Internal Medicine

## 2015-06-14 DIAGNOSIS — R748 Abnormal levels of other serum enzymes: Secondary | ICD-10-CM

## 2015-06-14 DIAGNOSIS — R609 Edema, unspecified: Secondary | ICD-10-CM | POA: Diagnosis not present

## 2015-06-14 DIAGNOSIS — D539 Nutritional anemia, unspecified: Secondary | ICD-10-CM

## 2015-06-14 NOTE — Progress Notes (Signed)
Patient ID: Todd [name], male   DOB: [birth year], 81 y.o.   MRN: [id number]

## 2015-06-14 NOTE — Progress Notes (Signed)
Patient ID: Todd [name], male   DOB: [birth year], 81 y.o.   MRN: [id number]        ACILITY: [facility]    LEVEL OF CARE:   SNF  This is an acute visit   CHIEF COMPLAINT:  Acute visit --secondary to finger edema tenderness     HISTORY OF PRESENT ILLNESS:   Patient is a pleasant elderly male who has been seen somewhat frequently later for some possible increased right hand and finger edema-also anemia-this was assessed by Dr. Leanord Hawking yesterday hemoglobin 9.0 yesterday appears to be somewhat lower than his baseline and updated labs have been ordered--he has been started on folic acid -- the anemia is macrocytic.  He also has had elevated liver function tests which appear to be improved he is now off his statin and Dr. Leanord Hawking is titrating down his Depakote is on this for questionable seizure versus behaviors  Nursing today is concerned that he may have some gout with the edema apparently somewhat increased on his right hand from baseline.  I do not see listed gout history and I do not see that we have obtained a uric acid here on him in the past.  .  Patient currently has no complaints he is a poor historian secondary to dementia          PAST MEDICAL HISTORY/PROBLEM LIST:                  Recurrent syncope.  Note that he had an admission from 08/18/2014 through 08/20/2014 for this issue, also 08/31/2014 through 09/01/2014 with altered mental status, 10/03/2014 through 10/05/2014 for ?TIA symptoms.  He has also presented with an unresponsive episode.    Hypertension.    Type 2 diabetes.  previously  Poorly controlled with a hemoglobin A1c on 10/03/2014 of 12.3.  On home Lantus.--This has improved to 7.8 on lab done on 04-08-15    Dementia.    Chronic kidney disease with a baseline creatinine of 1.5.--Most recently 1.25 on June 27    Coronary artery disease.   Status post CABG.    Hyperlipidemia.    CURRENT MEDICATIONS:  Discharge medications include:       .      Lipitor 80 q.d.--Currently on hold     Depakote 250 q.12.    Hydrochlorothiazide 12.5 q.d.    Lantus insulin 12 units daily at bedtime.    Lisinopril 30 mg .    Vitamin B12, 1000 U daily.    Vitamin D3, 1000 U daily.  Potassium 20 mEq a day  Xarelto  20 milligrams daily    SOCIAL HISTORY:                    HOUSING:  Patient previously lived at assisted living [facility]-and apparently there are plans to return there if possible sometime in the near future.   CODE STATUS:  He does come with a DNR status.    FAMILY HISTORY:   Not currently available.    REVIEW OF SYSTEMS:--Limited secondary to dementia CC history of present illness he does not complain of pain currently    -.   .    PHYSICAL EXAMINATION:   Temperature 97.0 pulse 60 respirations 16 blood pressure 126/77 g  GENERAL APPEARANCE:  The patient is not in any distress. He does speak but does not talk a lot  Probably on the severe side of dementia, but no evidence of delirium he is pleasant and follows simple verbal  commands.  His skin is warm and dry.--No increased bruising or bleeding noted  Eyes pupils appear reactive light sclerae   clear --visual acuity appears grossly intact CHEST/RESPIRATORY:  Clear air entry bilaterally-no -labored breathing.   CARDIOVASCULAR:  CARDIAC:   Heart sounds are normal with no carotid bruits l1-2/6 systolic murmur  Regular  rate and rhythm.--Has some baseline lower extremity edema bit more in the left versus the right--pedal pulses intact     . [name] Musculoskeletal-moves all extremities 4 --- the edema on the left leg is present -- there is no significant  warmth or tenderness to palpation here  He does have some edema to his right hand and fingers I could not really see erythema there is some mild tenderness to palpation of the fingers and metacarpal area he does have some mild contractures of his fingers on the right although this does not appear to be new The edema  appears to be cool    Neurologic speech is clear I do not see any lateralizing findings  Psych He is oriented to self will follow simple verbal commands---continues to be pleasant and cooperative during his stay here       Labs  06/13/2015.  TSH-2.83.  WBC 4.8 hemoglobin 9.0 platelets 120.  Sodium 143 potassium 3.9 BUN 37 creatinine 1.28.  Albumin 2.7-AST 73 ALT 79 are down from previous levels  04/25/2015.  Sodium 141 potassium 3.6 BUN 35 creatinine 1.25-albumen 2.7 AST 73 otherwise liver function tests within normal limits.  04/20/2015.  WBC 3.6 hemoglobin 9.2 platelets 119  04/08/2015.  Valproic acid 43.  Hemoglobin A1c 7.8 which actually is improvement from 3 months ago when it was 9.2 and 6 months ago when it was 12.3.  03/16/2015.  WBC 5.9 hemoglobin 10.2 platelets 161.  03/23/2015.  Sodium 142 potassium 3.8 BUN 20 creatinine 1.17.  Alb-3.2-otherwise liver function tests within normal limits.    12/23/2014.  WBC 4.5 hemoglobin 10.9 platelets 162.  Sodium 138 potassium 4.3 BUN 28 creatinine 1.19.  Liver function tests within normal limits  Cholesterol 189 triglycerides 101 HDL 44 LDL 125.  Hemoglobin A1c 9.2 Depakote level LXVII.6  .  11/29/2014.  WBC 4.2 hemoglobin 11.7 platelets 182  Nov 26 2013.  WBC 4.3 hemoglobin 10.5 platelets 120,000.  11/25/2013.  WBC 4.2 hemoglobin 10.3 platelets 108.  Valproic acid level LXXVI.5.  Sodium 139 potassium 4.5 BUN 32 creatinine 1.4  11/01/2014.  Valproic acid level LXXXVIII.  10/28/2014.  WBC 4.4 hemoglobin 10.5 platelets 175.  Sodium 142 potassium 3.7 BUN 25 creatinine 1.18.  Magnesium level II.0.  Depakote level XCVI.5.    10/27/2014.  Sodium 141 potassium 2.9 BUN 22 creatinine 1.11.  Depakote level 93.8.  10/16/2014.  WBC 10.1 hemoglobin 8.6 platelets 187.  Liver function tests within normal limits except albumin of 3.3.    .    ASSESSMENT/PLAN:    R Right  hand finger edema-some mild tenderness to palpation will order a uric acid level since one has not been obtained although likelihood of gout to me appears to be fairly low on clinical exam-this will need continued monitoring-clinically patient appears to be at baseline.  #2 anemia-again hemoglobin is on the lower end of his baseline this is macrocytic Dr. Leanord Hawking has started him on folic acid updated labs are pending.  #3-history elevated liver function tests these are moderating he is now off his statin Depakote is being titrated down as well.  [id number]                    .

## 2015-06-15 DIAGNOSIS — N183 Chronic kidney disease, stage 3 (moderate): Secondary | ICD-10-CM | POA: Diagnosis not present

## 2015-06-15 LAB — CBC
HCT: 26.7 % — ABNORMAL LOW (ref 39.0–52.0)
HEMOGLOBIN: 8.8 g/dL — AB (ref 13.0–17.0)
MCH: 33.5 pg (ref 26.0–34.0)
MCHC: 33 g/dL (ref 30.0–36.0)
MCV: 101.5 fL — AB (ref 78.0–100.0)
Platelets: 123 10*3/uL — ABNORMAL LOW (ref 150–400)
RBC: 2.63 MIL/uL — ABNORMAL LOW (ref 4.22–5.81)
RDW: 13.9 % (ref 11.5–15.5)
WBC: 3.9 10*3/uL — ABNORMAL LOW (ref 4.0–10.5)

## 2015-06-15 LAB — URIC ACID: Uric Acid, Serum: 5.6 mg/dL (ref 4.4–7.6)

## 2015-06-15 NOTE — Progress Notes (Addendum)
Patient ID: Todd [name], male   DOB: Mar 15, 1932, 81 y.o.   MRN: [id number]                PROGRESS NOTE  DATE:  06/13/2015          FACILITY: [facility]                      LEVEL OF CARE:   SNF   Acute Visit                    CHIEF COMPLAINT:  Elevated liver function tests, other issues.      HISTORY OF PRESENT ILLNESS:  Todd [name] is a gentleman whom we admitted to the building late in 2015.   At that point, his major problem was recurrent syncope.  He also had advanced dementia.  He has become a chronic patient in the facility.  Because of the recurrent syncope, I think that at one point he was empirically treated with valproic acid for seizures.    He has been followed by our service for mildly elevated liver function tests, hypoalbuminemia, mild chronic renal failure, anemia as well as thrombocytopenia which episodically has gotten below 100,000.    LABORATORY DATA:  An anemia panel done recently shows an iron of 52, a TIBC reduced at 220, ferritin level at 230, a B12 level of 2,874, and a slightly reduced folate level at 5.6.     Most recently, his white count was 4.8, hemoglobin of 9, platelet count of 122, an MCV slightly elevated at 100.7.    His comprehensive metabolic panel from today shows a sodium of 143, potassium 3.9, BUN 37, creatinine 1.28.  AST is 73, ALT 79, alk phos normal at 44.     REVIEW OF SYSTEMS:   It is difficult to get a sense of this patient today.  [name] after hours.  The regular staff is not here.  I do not think a review of systems is really worthwhile in this gentleman with severe dementia.        PHYSICAL EXAMINATION:   GENERAL APPEARANCE:  The patient is awake, but does not respond much to me.  Follows some directions.       CHEST/RESPIRATORY:  Clear air entry bilaterally.    CARDIOVASCULAR:   CARDIAC:  Heart sounds are normal.  There are no murmurs.    GASTROINTESTINAL:   ABDOMEN:   LIVER/SPLEEN/KIDNEYS:  No liver, no  spleen.  No tenderness.  No stigmata of chronic liver disease.   CIRCULATION:   EDEMA/VARICOSITIES:  Extremities:  Mild edema.       ASSESSMENT/PLAN:                     Anemia and thrombocytopenia.  His anemia is macrocytic.  He is somewhat folic acid deficient.  I will try to replace this.  I suspect the thrombocytopenia may be part of the anemia as related to valproic acid.  He was originally put on this empirically for seizures.  I think the reduction of the valproic acid here is reasonable as we are now potentially looking at bone marrow suppression as well as liver toxicity.  We could consider adding Keppra if there is a concern about seizures.    Chronic renal insufficiency.  I think this is stable.    Anemia.  Again, this is macrocytic and I am going to start him on folic acid.

## 2015-06-22 ENCOUNTER — Encounter: Payer: Self-pay | Admitting: Internal Medicine

## 2015-06-22 ENCOUNTER — Non-Acute Institutional Stay: Payer: Medicare PPO | Admitting: Internal Medicine

## 2015-06-22 DIAGNOSIS — H109 Unspecified conjunctivitis: Secondary | ICD-10-CM | POA: Diagnosis not present

## 2015-06-22 DIAGNOSIS — D539 Nutritional anemia, unspecified: Secondary | ICD-10-CM

## 2015-06-22 DIAGNOSIS — D649 Anemia, unspecified: Secondary | ICD-10-CM | POA: Insufficient documentation

## 2015-06-22 NOTE — Progress Notes (Signed)
Patient ID: Todd [name], male   DOB: 1932-05-25, 81 y.o.   MRN: [id number]         ACILITY: [facility]    LEVEL OF CARE:   SNF  This is an acute visit   CHIEF COMPLAINT:  Acute visit --secondary right eye exudate follow-up increased behaviors     HISTORY OF PRESENT ILLNESS:   Patient is a pleasant elderly male who apparently has some matting of his right eye in the morning according to nursing I am following up on this patient is a poor historian secondary to dementia but apparently there have been no visual changes or complaints by pain.  Patient also has had some increased behaviors and non-compliance recently which is unusual for him however speaking with nursing today this has improved significantly and is now back more at his baseline which is pleasantly confused.  We've also been following some suspected anemia hemoglobin had dropped some but appears to have stabilized most recently 8.8 on lab done on August 17 Dr. Leanord Hawking is following this as well an updated lab is pending for next week he has been started on Folic acid    PAST MEDICAL HISTORY/PROBLEM LIST:                  Recurrent syncope.  Note that he had an admission from 08/18/2014 through 08/20/2014 for this issue, also 08/31/2014 through 09/01/2014 with altered mental status, 10/03/2014 through 10/05/2014 for ?TIA symptoms.  He has also presented with an unresponsive episode.    Hypertension.    Type 2 diabetes.  previously  Poorly controlled with a hemoglobin A1c on 10/03/2014 of 12.3.  On home Lantus.--This has improved to 7.8 on lab done on 04-08-15    Dementia.    Chronic kidney disease with a baseline creatinine of 1.5.--Most recently 1.25 on June 27    Coronary artery disease.   Status post CABG.    Hyperlipidemia.    CURRENT MEDICATIONS:  Discharge medications include:       .    Lipitor 80 q.d.--Currently on hold --secondary to elevated liver function tests which appear to be improving      Depakote 250 q.12--this is being titrated down.    Hydrochlorothiazide 12.5 q.d.    Lantus insulin 12 units daily at bedtime.    Lisinopril 30 mg .    Vitamin B12, 1000 U daily.    Vitamin D3, 1000 U daily.  Potassium 20 mEq a day  Xarelto  20 milligrams daily    SOCIAL HISTORY:                    HOUSING:  Patient previously lived at assisted living [facility]-and apparently there are plans to return there if possible sometime in the near future.   CODE STATUS:  He does come with a DNR status.    FAMILY HISTORY:   Not currently available.    REVIEW OF SYSTEMS:--Limited secondary to dementia CC history of present illness he does not complain of pain currently    -.   .    PHYSICAL EXAMINATION:   Temperature 97.6 pulse 74 respirations 20 blood pressure 142/84-119/60 recently in this range  GENERAL APPEARANCE:  The patient is not in any distress. He does speak but does not talk a lot  Probably on the severe side of dementia, but no evidence of delirium he is pleasant and follows simple verbal commands.  His skin is warm and dry.--No increased bruising or bleeding  noted  Eyes pupils appear reactive right eye does have a small amount of exudate in the lower lid margin conjunctiva is slightly erythematous as well-- visual acuity appears grossly intact- CHEST/RESPIRATORY:  Clear air entry bilaterally-no -labored breathing.   CARDIOVASCULAR:  CARDIAC:   Heart sounds are normal with no carotid bruits l1-2/6 systolic murmur  Regular  rate and rhythm.--Has some baseline lower extremity edema bit more in the left versus the right--     . [name] Musculoskeletal-moves all extremities 4 --- the edema on the left leg is present -- there is no significant  warmth or tenderness to palpation here       Neurologic speech is clear I do not see any lateralizing findings  Psych He is oriented to self will follow simple verbal commands---continues to be pleasant and cooperative         Labs  06/15/2015.  Hemoglobin 8.8 this is microcytic with an MCV of 101.5 baseline thrombocytopenia at 123-white count is 3.9  06/13/2015.  TSH-2.83.  WBC 4.8 hemoglobin 9.0 platelets 120.  Sodium 143 potassium 3.9 BUN 37 creatinine 1.28.  Albumin 2.7-AST 73 ALT 79 are down from previous levels  04/25/2015.  Sodium 141 potassium 3.6 BUN 35 creatinine 1.25-albumen 2.7 AST 73 otherwise liver function tests within normal limits.  04/20/2015.  WBC 3.6 hemoglobin 9.2 platelets 119  04/08/2015.  Valproic acid 43.  Hemoglobin A1c 7.8 which actually is improvement from 3 months ago when it was 9.2 and 6 months ago when it was 12.3.  03/16/2015.  WBC 5.9 hemoglobin 10.2 platelets 161.  03/23/2015.  Sodium 142 potassium 3.8 BUN 20 creatinine 1.17.  Alb-3.2-otherwise liver function tests within normal limits.    12/23/2014.  WBC 4.5 hemoglobin 10.9 platelets 162.  Sodium 138 potassium 4.3 BUN 28 creatinine 1.19.  Liver function tests within normal limits  Cholesterol 189 triglycerides 101 HDL 44 LDL 125.  Hemoglobin A1c 9.2 Depakote level LXVII.6  .  11/29/2014.  WBC 4.2 hemoglobin 11.7 platelets 182  Nov 26 2013.  WBC 4.3 hemoglobin 10.5 platelets 120,000.  11/25/2013.  WBC 4.2 hemoglobin 10.3 platelets 108.  Valproic acid level LXXVI.5.  Sodium 139 potassium 4.5 BUN 32 creatinine 1.4  11/01/2014.  Valproic acid level LXXXVIII.  10/28/2014.  WBC 4.4 hemoglobin 10.5 platelets 175.  Sodium 142 potassium 3.7 BUN 25 creatinine 1.18.  Magnesium level II.0.  Depakote level XCVI.5.    10/27/2014.  Sodium 141 potassium 2.9 BUN 22 creatinine 1.11.  Depakote level 93.8.  10/16/2014.  WBC 10.1 hemoglobin 8.6 platelets 187.  Liver function tests within normal limits except albumin of 3.3.    .    ASSESSMENT/PLAN:   #1-conjunctivitis-this appears to be a fairly mild case in the right eye Will treat with Cipro ophthalmology  solution 2 drops 3 times a day for 7 days and monitor for resolution.  #2 anemia macrocytic he has been started on folic acid update labs are pending.  [id number]                        .

## 2015-06-24 ENCOUNTER — Non-Acute Institutional Stay: Payer: Medicare PPO | Admitting: Internal Medicine

## 2015-06-24 DIAGNOSIS — E1165 Type 2 diabetes mellitus with hyperglycemia: Secondary | ICD-10-CM

## 2015-06-24 DIAGNOSIS — D539 Nutritional anemia, unspecified: Secondary | ICD-10-CM | POA: Diagnosis not present

## 2015-06-24 DIAGNOSIS — IMO0002 Reserved for concepts with insufficient information to code with codable children: Secondary | ICD-10-CM

## 2015-06-24 NOTE — Progress Notes (Signed)
Patient ID: Todd [name], male   DOB: 1931-10-30, 81 y.o.   MRN: [id number]          ACILITY: [facility]    LEVEL OF CARE:   SNF  This is an acute visit   CHIEF COMPLAINT:  Acute visit --secondary to follow-up diabetes     HISTORY OF PRESENT ILLNESS:   Patient is a pleasant elderly male who has been seen somewhat frequently later for some possible increased right hand and finger edema-also anemia-this was assessed by Dr. Leanord Hawking yesterday hemoglobin 9.0 yesterday appears to be somewhat lower than his baseline and updated labs have been ordered--he has been started on folic acid -- the anemia is macrocytic.  He also has had elevated liver function tests which appear to be improved he is now off his statin and Dr. Leanord Hawking is titrating down his Depakote is on this for questionable seizure versus behaviors  Nursing today is concerned that  He has somewhat elevated blood sugars later in the day he is on Lantus insulin 12 units daily at bedtime he's also on NovoLog 3 units with meals if CBG is greater than 200 appears morning sugars run largely in the mid 100s and at noon mid 100s to mid 200s later in the day it is more elevated largely it appears more in the 2-300 range at at bedtime often is in the higher 200s and occasional 300s.  Recently nursing staff was concerned about some increased right finger edema this appears to have improved somewhat a uric acid level was unremarkable-patient also apparently according in nursing staff was not quite himself at one point recently but apparently he is now more in his normal behavior he is pleasant cooperative confused which is his baseline apparently there is some agitation last week but this appears to have normalized  .  Patient currently has no complaints he is a poor historian secondary to dementia          PAST MEDICAL HISTORY/PROBLEM LIST:                  Recurrent syncope.  Note that he had an admission from 08/18/2014  through 08/20/2014 for this issue, also 08/31/2014 through 09/01/2014 with altered mental status, 10/03/2014 through 10/05/2014 for ?TIA symptoms.  He has also presented with an unresponsive episode.    Hypertension.    Type 2 diabetes.  previously  Poorly controlled with a hemoglobin A1c on 10/03/2014 of 12.3.  On home Lantus.--This has improved to 7.8 on lab done on 04-08-15    Dementia.    Chronic kidney disease with a baseline creatinine of 1.5.--Most recently 1.25 on June 27    Coronary artery disease.   Status post CABG.    Hyperlipidemia.    CURRENT MEDICATIONS:  Discharge medications include:       .    Lipitor 80 q.d.--Currently on hold     Depakote 250 q.12.    Hydrochlorothiazide 12.5 q.d.    Lantus insulin 12 units daily at bedtime.    Lisinopril 30 mg .    Vitamin B12, 1000 U daily.    Vitamin D3, 1000 U daily.  Potassium 20 mEq a day  Xarelto  20 milligrams daily    SOCIAL HISTORY:                    HOUSING:  Patient previously lived at assisted living [facility]-and apparently there are plans to return there if possible sometime in the near future.  CODE STATUS:  He does come with a DNR status.    FAMILY HISTORY:   Not currently available.    REVIEW OF SYSTEMS:--Limited secondary to dementia Obtain from patient and nursing staff.  In general no fever or chills.  Respiratory no complaint shortness breath or cough.  Cardiac no chest pain as some chronic lower extremity edema more so on the left versus the right.  Musculoskeletal does not complain of joint pain today.  Neurologic does not complain of dizziness or headache     -.   .    PHYSICAL EXAMINATION:   Temperature 97.6 pulse 74 respirations 20 blood pressure 142/84-119/60 in this range  GENERAL APPEARANCE:  The patient is not in any distress. He does speak but does not talk a lot  Probably on the severe side of dementia, but no evidence of delirium he is pleasant and follows simple  verbal commands.  His skin is warm and dry.--No increased bruising or bleeding noted  Eyes pupils appear reactive light sclerae   clear --visual acuity appears grossly intact CHEST/RESPIRATORY:  Clear air entry bilaterally-no -labored breathing.   CARDIOVASCULAR:  CARDIAC:   Heart sounds are normal with no carotid bruits l1-2/6 systolic murmur  Regular  rate and rhythm.-Occasional irregular beat-Has some baseline lower extremity edema bit more in the left versus the right--     . [name] Musculoskeletal-moves all extremities 4 --- the edema on the left leg is present -- there is no significant  warmth or tenderness to palpation here   Still has some mild edema of his right hand and fingers but this appears to be somewhat improved on previous exam    Neurologic speech is clear I do not see any lateralizing findings  Psych He is oriented to self will follow simple verbal commands---continues to be pleasant and cooperative during his stay here       Labs  06/15/2015 WBC 3.9 hemoglobin 8.8 platelets 123.  Uric acid-5.6  06/13/2015.  TSH-2.83.  WBC 4.8 hemoglobin 9.0 platelets 120.  Sodium 143 potassium 3.9 BUN 37 creatinine 1.28.  Albumin 2.7-AST 73 ALT 79 are down from previous levels  04/25/2015.  Sodium 141 potassium 3.6 BUN 35 creatinine 1.25-albumen 2.7 AST 73 otherwise liver function tests within normal limits.  04/20/2015.  WBC 3.6 hemoglobin 9.2 platelets 119  04/08/2015.  Valproic acid 43.  Hemoglobin A1c 7.8 which actually is improvement from 3 months ago when it was 9.2 and 6 months ago when it was 12.3.  03/16/2015.  WBC 5.9 hemoglobin 10.2 platelets 161.  03/23/2015.  Sodium 142 potassium 3.8 BUN 20 creatinine 1.17.  Alb-3.2-otherwise liver function tests within normal limits.    12/23/2014.  WBC 4.5 hemoglobin 10.9 platelets 162.  Sodium 138 potassium 4.3 BUN 28 creatinine 1.19.  Liver function tests within normal limits  Cholesterol 189  triglycerides 101 HDL 44 LDL 125.  Hemoglobin A1c 9.2 Depakote level LXVII.6  .  11/29/2014.  WBC 4.2 hemoglobin 11.7 platelets 182  Nov 26 2013.  WBC 4.3 hemoglobin 10.5 platelets 120,000.  11/25/2013.  WBC 4.2 hemoglobin 10.3 platelets 108.  Valproic acid level LXXVI.5.  Sodium 139 potassium 4.5 BUN 32 creatinine 1.4  11/01/2014.  Valproic acid level LXXXVIII.  10/28/2014.  WBC 4.4 hemoglobin 10.5 platelets 175.  Sodium 142 potassium 3.7 BUN 25 creatinine 1.18.  Magnesium level II.0.  Depakote level XCVI.5.    10/27/2014.  Sodium 141 potassium 2.9 BUN 22 creatinine 1.11.  Depakote level 93.8.  10/16/2014.  WBC 10.1  hemoglobin 8.6 platelets 187.  Liver function tests within normal limits except albumin of 3.3.    .    ASSESSMENT/PLAN:     #1-diabetes type 2-sugars appear to be higher at the end of the day will increase the Humalog to 5 units before meals if CBG is greater than 200 and monitor I suspect we may have to make further titration but blood sugars in the a.m. appear to be relatively under better control so will not change to Lantus for now.  #2 anemia-again hemoglobin is on the lower end of his baseline this is macrocytic Dr. Leanord Hawking has started him on folic acid updated labs are pending.  #3-history elevated liver function tests these are moderating he is now off his statin Depakote is being titrated down as well.  [id number]                    .

## 2015-06-27 ENCOUNTER — Encounter (HOSPITAL_COMMUNITY)
Admission: RE | Admit: 2015-06-27 | Discharge: 2015-06-27 | Disposition: A | Discharge status: Home / Self Care | Payer: Medicare PPO | Source: Skilled Nursing Facility | Attending: Internal Medicine | Admitting: Internal Medicine

## 2015-06-27 DIAGNOSIS — N183 Chronic kidney disease, stage 3 (moderate): Secondary | ICD-10-CM | POA: Diagnosis not present

## 2015-06-27 LAB — CBC WITH DIFFERENTIAL/PLATELET
BASOS ABS: 0 10*3/uL (ref 0.0–0.1)
Basophils Relative: 1 % (ref 0–1)
Eosinophils Absolute: 0.1 10*3/uL (ref 0.0–0.7)
Eosinophils Relative: 1 % (ref 0–5)
HEMATOCRIT: 30 % — AB (ref 39.0–52.0)
Hemoglobin: 9.9 g/dL — ABNORMAL LOW (ref 13.0–17.0)
Lymphocytes Relative: 31 % (ref 12–46)
Lymphs Abs: 1.4 10*3/uL (ref 0.7–4.0)
MCH: 33.2 pg (ref 26.0–34.0)
MCHC: 33 g/dL (ref 30.0–36.0)
MCV: 100.7 fL — ABNORMAL HIGH (ref 78.0–100.0)
Monocytes Absolute: 0.5 10*3/uL (ref 0.1–1.0)
Monocytes Relative: 12 % (ref 3–12)
NEUTROS ABS: 2.4 10*3/uL (ref 1.7–7.7)
NEUTROS PCT: 55 % (ref 43–77)
Platelets: 214 10*3/uL (ref 150–400)
RBC: 2.98 MIL/uL — AB (ref 4.22–5.81)
RDW: 13.6 % (ref 11.5–15.5)
WBC: 4.5 10*3/uL (ref 4.0–10.5)

## 2015-06-27 LAB — COMPREHENSIVE METABOLIC PANEL
ALK PHOS: 53 U/L (ref 38–126)
ALT: 20 U/L (ref 17–63)
AST: 19 U/L (ref 15–41)
Albumin: 3 g/dL — ABNORMAL LOW (ref 3.5–5.0)
Anion gap: 8 (ref 5–15)
BILIRUBIN TOTAL: 0.7 mg/dL (ref 0.3–1.2)
BUN: 28 mg/dL — AB (ref 6–20)
CALCIUM: 8.7 mg/dL — AB (ref 8.9–10.3)
CO2: 29 mmol/L (ref 22–32)
Chloride: 105 mmol/L (ref 101–111)
Creatinine, Ser: 1.3 mg/dL — ABNORMAL HIGH (ref 0.61–1.24)
GFR calc Af Amer: 58 mL/min — ABNORMAL LOW (ref >60)
GFR calc non Af Amer: 50 mL/min — ABNORMAL LOW (ref >60)
Glucose, Bld: 212 mg/dL — ABNORMAL HIGH (ref 65–99)
Potassium: 4 mmol/L (ref 3.5–5.1)
SODIUM: 142 mmol/L (ref 135–145)
TOTAL PROTEIN: 6.7 g/dL (ref 6.5–8.1)

## 2015-06-27 LAB — VALPROIC ACID LEVEL: Valproic Acid Lvl: 34 ug/mL — ABNORMAL LOW (ref 50.0–100.0)

## 2015-07-01 NOTE — Progress Notes (Signed)
Patient ID: Todd [name], male   DOB: [birth year], 81 y.o.   MRN: [id number]       ACILITY: [facility]    LEVEL OF CARE:   SNF  This is a routine--acute  visit   CHIEF COMPLAINT:  Medical management of chronic issues including weakness dementia hypertension history renal insufficiency- left lower leg DVT--acute visit secondary to finger edema     HISTORY OF PRESENT ILLNESS:   Patient is a pleasant elderly male with weakness  here for rehabilitation and monitoring--he had been hospitalized for altered mental status lethargy which apparently improved during  hospitalization he was empirically started apparently on Depakote for concerns he may be having seizures. After his discharge here He was found recently to have increased edema of his left lower leg and  Doppler ultrasound was positive for DVT nonocclusive thrombus  on the proximal and distal left femoral vein and within the left popliteal vein-initially was started on Lovenox-he has now been transferred to Xarelto.  Clinically this appears to be improved there is still some residual edema of the left leg but this does not appear to be uncomfortable  This is complicated somewhat with a history of thrombocytopenia However lab done most recently August 3 this was up to 127,000 had been 170,000-hemoglobin appears to be relatively stable at 11.4.  There's been no increased bruising or bleeding noted patient has no complaints he can be somewhat of a poor historian however secondary to dementia   he does have a history of hypertension he is on hydrochlorothiazide 12.5 this was reduced from 25 secondary to renal issues--also on lisinopril 30 mg a day-. Recent blood pressure 110/50 one shows stability   He also has a history of diabetes type 2 He is on Lantus 12 units daily at bedtime-morning sugars appear to be relatively stable he has some spikes later in the day I suspect this may be appetite fluid related.  However has been  noted patient also has lost weight it appears about 10 pounds in the past month-according to nursing he eats well there've been no reports of increased dysphagia he does not complain of any abdominal pain.  Nursing is also noticed some increased edema of his fingers bilaterally this does not appear to be painful erythematous or tender nonetheless nursing staff would like me to look at this   Does have a history of intermittent unresponsiveness that resulted in a trip to the ER last spring actually by the time he got to the ER he was responding apparently at baseline-he apparently has a fairly long history of this previously-no etiology per previous workups was found.  There  have  been no further episodes -patient is a poor historian but has no complaints today  y       PAST MEDICAL HISTORY/PROBLEM LIST:                  Recurrent syncope.  Note that he had an admission from 08/18/2014 through 08/20/2014 for this issue, also 08/31/2014 through 09/01/2014 with altered mental status, 10/03/2014 through 10/05/2014 for ?TIA symptoms.  He has also presented with an unresponsive episode.    Hypertension.    Type 2 diabetes.  previously  Poorly controlled with a hemoglobin A1c on 10/03/2014 of 12.3.  On home Lantus.--This has improved to 7.8 on lab done on 04-08-15    Dementia.    Chronic kidney disease with a baseline creatinine of 1.5.    Coronary artery disease.   Status  post CABG.    Hyperlipidemia.    CURRENT MEDICATIONS:  Discharge medications include:       .    Lipitor 80 q.d.     Depakote 500 q.12.    Hydrochlorothiazide 12.5 q.d.    Lantus 12 units daily at bedtime.    Lisinopril 30 mg .    Vitamin B12, 1000 U daily.    Vitamin D3, 1000 U daily.  Potassium 20 mEq a day  Xarelto  20 milligrams daily    SOCIAL HISTORY:                    HOUSING:  Patient previously lived at assisted living [facility]-and apparently there are plans to return there if possible  sometime in the near future.   CODE STATUS:  He does come with a DNR status.    FAMILY HISTORY:   Not currently available.    REVIEW OF SYSTEMS:--Limited secondary to dementiaper nursing staff is stay here has been quite uneventful with no acute complaints not complaining of  leg pain shortness of breath chest pain there has been no increased bruising or bleeding noted--he has experienced some increased finger edema  In general no complaints fever or chills.  Skin-no rashes or itching noted.  Head ears eyes nose mouth and throat-does not complain of any sore throat visual changes.  Cardiac no chest  Pain-- mild left lower extremity edema-this has been chronic.  Respiratory-no cough or shortness of breath noted.  GI-apparently eats well does not complaining of abdominal discomfort nausea or vomiting diarrhea or constipation.  Musculoskeletal-apparently joint pain has not been an issue --is not really complaining of any pain in his left leg.  Neurologic again no syncopal episodes have been noted.  Psych he does have significant dementia but behaviors have been well-controlled he's been pleasant and cooperative-.   [name]    PHYSICAL EXAMINATION:   Temperature 97.8 pulse 68 respirations 20 blood pressure 110/51 O2 saturation is in the 90s on room air weight is 171.8. It was 181.6 in mid-July  GENERAL APPEARANCE:  The patient is not in any distress. He does speak but does not talk a lot  Probably on the severe side of dementia, but no evidence of delirium he is pleasant and follows simple verbal commands.  His skin is warm and dry.--No increased bruising or bleeding noted--his fingers do appear to have somewhat increased edema but they are nontender nonerythematous--not warm- capillary refill is intact radial pulses are intact bilaterally  Eyes pupils appear reactive light sclerae   clear --visual acuity appears grossly intact CHEST/RESPIRATORY:  Clear air entry bilaterally-no -labored  breathing.   CARDIOVASCULAR:  CARDIAC:   Heart sounds are normal with no carotid bruits l1-2/6 systolic murmur  Regular  rate and rhythm.--Has some baseline lower extremity edema bit more in the left versus the right--pedal pulses intact   GASTROINTESTINAL:  ABDOMEN:   Soft, nontender.  No masses. Positive bowel sounds   . [name] Musculoskeletal-moves all extremities 4 --- the edema on the left leg is present -- there is no significant  warmth or tenderness to palpation here  --pedal pulses somewhat reduced SENSATION/STRENGTH:  Strength is normal--he is ambulatory moves all extremities at baseline I did not note any deformities. Grip strength is strong bilaterally is able to flex his fingers without discomfort   Neurologic speech is clear I do not see any lateralizing findings  Psych He is oriented to self will follow simple verbal commands---continues to be pleasant  and cooperative during his stay here       Labs   06/01/2015.  WBC 4.1 hemoglobin 11.4 platelets 127.  05/26/2015.  Sodium 141 potassium 4.3 BUN 31 creatinine 1.31.  AST 44-albumin 3.0 otherwise liver function tests within normal limits  04/08/2015.  Valproic acid 43.  Hemoglobin A1c 7.8 which actually is improvement from 3 months ago when it was 9.2 and 6 months ago when it was 12.3.  03/16/2015.  WBC 5.9 hemoglobin 10.2 platelets 161.  03/23/2015.  Sodium 142 potassium 3.8 BUN 20 creatinine 1.17.  Alb-3.2-otherwise liver function tests within normal limits.    12/23/2014.  WBC 4.5 hemoglobin 10.9 platelets 162.  Sodium 138 potassium 4.3 BUN 28 creatinine 1.19.  Liver function tests within normal limits  Cholesterol 189 triglycerides 101 HDL 44 LDL 125.  Hemoglobin A1c 9.2 Depakote level LXVII.6  .  11/29/2014.  WBC 4.2 hemoglobin 11.7 platelets 182  Nov 26 2013.  WBC 4.3 hemoglobin 10.5 platelets 120,000.  11/25/2013.  WBC 4.2 hemoglobin 10.3 platelets 108.  Valproic acid level  LXXVI.5.  Sodium 139 potassium 4.5 BUN 32 creatinine 1.4  11/01/2014.  Valproic acid level LXXXVIII.  10/28/2014.  WBC 4.4 hemoglobin 10.5 platelets 175.  Sodium 142 potassium 3.7 BUN 25 creatinine 1.18.  Magnesium level II.0.  Depakote level XCVI.5.    10/27/2014.  Sodium 141 potassium 2.9 BUN 22 creatinine 1.11.  Depakote level 93.8.  10/16/2014.  WBC 10.1 hemoglobin 8.6 platelets 187.  Liver function tests within normal limits except albumin of 3.3.    .    ASSESSMENT/PLAN:     Left lower leg edema-with recent history of left leg DVT-again he is on anticoagulation with Xarelto-this appears to be  improved clinically he appears stable.                                                      Thrombocytopenia-platelets appear to have rebounded-will update lab  Renal insufficiency-creatinine most recently 1.31--it appears to be relatively baseline Will update this.  Diabetes type 2-  His hemoglobin A1c actually is improving most recently 7.8-this is down from 12.-3---6 months ago-has somewhat variable blood sugars as stated above especially later in the day-- This point will monitor he is on NovoLog 3 units 3 times a day as well we may want to increase this but would like to see  more readings    Hypertension- This appears to be stable he is on lisinopril 40 mg a day hydrochlorothiazide 12.5 mg a day   Question seizure disorder-he is on Depakote-Depakote level was 43 in June there've been no seizures during his stay here to my knowledge at this point will monitor-since he is stable we will not adjust Depakote dose at this time   Hyperlipidemia-he continues on a statin--recent liver function tests were fairly unremarkable although I do note AST was 44 we will update this-somewhat conservative here with patient's advanced age and comorbidities   Dementia with behaviors-this appears to be stable apparently this was an issue at assisted living but his stay here  behavior wise has been quite unremarkable he is pleasant I do not really note any issues per nursing--he apparently does have a history of intermittent syncopal episodes which are fairly rare as noted above  History of unresponsive episodes-as noted above occasionally apparently he will have this with  no etiology found he went to the ER about a month ago for this-lab work was unremarkable there been no further episodes-at this point will monitor.  Weight loss this is somewhat perplexing will update his metabolic panel as well as a CBC with differential and a TSH tomorrow to see if this can give [place] some insight-also will order a dietary consult secondary to weight loss-clinically he appears stable according nursing staff eats fairly well will see what the labs tell us initially.  Finger edema-at this point monitor this might be dependent related-it is not painful red erythematous or warm-he does not appear to be uncomfortable with this at this point will watch  [phone]

## 2015-07-26 ENCOUNTER — Non-Acute Institutional Stay: Payer: Medicare PPO | Admitting: Internal Medicine

## 2015-07-26 ENCOUNTER — Encounter: Payer: Self-pay | Admitting: Internal Medicine

## 2015-07-26 DIAGNOSIS — F0391 Unspecified dementia with behavioral disturbance: Secondary | ICD-10-CM

## 2015-07-26 DIAGNOSIS — N189 Chronic kidney disease, unspecified: Secondary | ICD-10-CM | POA: Diagnosis not present

## 2015-07-26 DIAGNOSIS — E1121 Type 2 diabetes mellitus with diabetic nephropathy: Secondary | ICD-10-CM

## 2015-07-26 DIAGNOSIS — I82402 Acute embolism and thrombosis of unspecified deep veins of left lower extremity: Secondary | ICD-10-CM | POA: Diagnosis not present

## 2015-07-26 DIAGNOSIS — I10 Essential (primary) hypertension: Secondary | ICD-10-CM | POA: Diagnosis not present

## 2015-07-26 DIAGNOSIS — D696 Thrombocytopenia, unspecified: Secondary | ICD-10-CM | POA: Diagnosis not present

## 2015-07-26 NOTE — Progress Notes (Signed)
Patient ID: Todd [name], male   DOB: [birth year], 81 y.o.   MRN: [id number]        ACILITY: [facility]    LEVEL OF CARE:   SNF  This is a routine--  visit   CHIEF COMPLAINT:  Medical management of chronic issues including weakness dementia hypertension history renal insufficiency- left lower leg DVT-diabetes type 2-anemia-     HISTORY OF PRESENT ILLNESS:   Patient is a pleasant elderly male with weakness  here for rehabilitation and monitoring--he had been hospitalized for altered mental status lethargy which apparently improved during  hospitalization he was empirically started apparently on Depakote for concerns he may be having seizures. After his discharge here He was found  to have increased edema of his left lower leg and  Doppler ultrasound was positive for DVT nonocclusive thrombus  on the proximal and distal left femoral vein and within the left popliteal vein he has completed a six-month course of Xarelto is now on aspirin.     This is complicated somewhat with a history of thrombocytopenia--this was thought possibly at least partially related to his Depakote-Dr. Leanord Hawking did titrate down his Depakote and appears his platelets have normalize most recently 214,000 on lab done in late August we will update this there's been no increased bruising or bleeding       he does have a history of hypertension he is on hydrochlorothiazide 12.5 this was reduced from 25 secondary to renal issues--also on lisinopril 40 mg a day-. Recent blood pressurs es127/66-132/66   He also has a history of diabetes type 2 He is on Lantus 25 units daily at bedtime-if sugars are variable appears she's run quite low recently in the morning actually was 52 this morning I see previo60-80's  actually I see a  40 approximately a week ago.  He has not been symptomatic and apparently blood sugar rises appropriately with supplementation later had a blood sugars are more elevated lately in the mid  100s to mid 200s largely at noon-more 200-250 averaging at 4 and this appears to be case also at at bedtime  Patient did have some weight loss several months ago however his appetite has rebounded and his weight is back at baseline at 181.6 which is encouraging.  Another issue patient recently had his elevated liver function tests these have normalized his statin was discontinued and again his Depakote was decreased as well.  Another issue is his anemia this is macrocytic K hemoglobin did drop significantly into the eights-Dr. Leanord Hawking did start him on folic acid supplementation and most recent hemoglobin was 9.9 in late August which appears to be trending up.  Patient occasionally will have periods of unresponsiveness that are transitory has gone to the ER for this before-is not had any recent episodes however--there is no true etiology found question possibly seizures-  Currently patient has no complaints nursing staff reports she eats well he had periods of agitation several weeks ago which appears to have subsided-he appears to be back at his baseline   .    y       PAST MEDICAL HISTORY/PROBLEM LIST:                  Recurrent syncope.  Note that he had an admission from 08/18/2014 through 08/20/2014 for this issue, also 08/31/2014 through 09/01/2014 with altered mental status, 10/03/2014 through 10/05/2014 for ?TIA symptoms.  He has also presented with an unresponsive episode.    Hypertension.  Type 2 diabetes.       Dementia.    Chronic kidney disease with a baseline creatinine of 1.5 was recently improved at 1.3.    Coronary artery disease.   Status post CABG.    Hyperlipidemia. -Now off statins secondary to elevated liver function tests   CURRENT MEDICATIONS:  Discharge medications include:       .       Depakote 250 mg q.12.    Hydrochlorothiazide 12.5 q.d.    Lantus 25 units daily at bedtime.    Lisinopril 40 mg QD.    Vitamin B12, 1000 U daily.     Vitamin D3, 1000 U daily.  Potassium 20 mEq a day  Aspirin 81 mg daily    Folic acid 1 mg daily  NovoLog insulin  5 units before meals meals if CBG greater than 200  SOCIAL HISTORY:                    HOUSING:  Patient previously lived at assisted living [facility]-and apparently there are plans to return there if possible sometime in the near future.   CODE STATUS:  He does come with a DNR status.    FAMILY HISTORY:   Not currently available.    REVIEW OF SYSTEMS:--Limited secondary to dementia Please see history of present illness to have no complaints of pain shortness of breath chest pain bleeding or bruising or recent syncopal episodes  In general no complaints fever or chills.  Skin-no rashes or itching noted.  Head ears eyes nose mouth and throat-does not complain of any sore throat visual changes.  Cardiac no chest  Pain-- mild left lower extremity edema-this has been chronic.  Respiratory-no cough or shortness of breath noted.  GI-apparently eats well does not complaining of abdominal discomfort nausea or vomiting diarrhea or constipation.  Musculoskeletal-apparently joint pain has not been an issue --is not really complaining of any pain in his left leg.  Neurologic again no syncopal episodes have been noted.  Psych he does have significant dementia but behaviors have been well-controlled he's been pleasant and cooperative-.   [name]    PHYSICAL EXAMINATION:  He is afebrile pulse 68 respirations 18 blood pressure 127/66 8 has stabilized at 181.6  GENERAL APPEARANCE:  The patient is not in any distress. He does speak but does not talk a lot  Probably on the severe side of dementia, but no evidence of delirium he is pleasant and follows simple verbal commands.  His skin is warm and dry.--No increased bruising or bleeding noted--   Eyes pupils appear reactive light sclerae   clear --visual acuity appears grossly intact CHEST/RESPIRATORY:  Clear air entry  bilaterally-no -labored breathing.   CARDIOVASCULAR:  CARDIAC:   Heart sounds are normal with no carotid bruits l1-2/6 systolic murmur  Regular  rate and rhythm.--Has some baseline lower extremity edema bit more in the left versus the rightt   GASTROINTESTINAL:  ABDOMEN:   Soft, nontender.  No masses. Positive bowel sounds   . [name] Musculoskeletal-moves all extremities 4 ---  Is able to stand and ambulate although unsteady SENSATION/STRENGTH:  Strength is normal--he is ambulatory moves all extremities at baseline I did not note any deformities.    Neurologic speech is clear I do not see any lateralizing findings  Psych He is oriented to self will follow simple verbal commands---continues to be pleasant and cooperative during his stay here       Labs  06/27/2015.  Sodium 142 potassium  4 BUN 28 creatinine 1.3.  Albumin 3.0 otherwise liver function tests within normal limits.  CBC 4.5 hemoglobin 9.9 platelets 214.  Valproic acid level XXXIV     06/01/2015.  WBC 4.1 hemoglobin 11.4 platelets 127.  05/26/2015.  Sodium 141 potassium 4.3 BUN 31 creatinine 1.31.  AST 44-albumin 3.0 otherwise liver function tests within normal limits  04/08/2015.  Valproic acid 43.  Hemoglobin A1c 7.8 which actually is improvement from 3 months ago when it was 9.2 and 6 months ago when it was 12.3.  03/16/2015.  WBC 5.9 hemoglobin 10.2 platelets 161.  03/23/2015.  Sodium 142 potassium 3.8 BUN 20 creatinine 1.17.  Alb-3.2-otherwise liver function tests within normal limits.    12/23/2014.  WBC 4.5 hemoglobin 10.9 platelets 162.  Sodium 138 potassium 4.3 BUN 28 creatinine 1.19.  Liver function tests within normal limits  Cholesterol 189 triglycerides 101 HDL 44 LDL 125.  Hemoglobin A1c 9.2 Depakote level LXVII.6  .  11/29/2014.  WBC 4.2 hemoglobin 11.7 platelets 182  Nov 26 2013.  WBC 4.3 hemoglobin 10.5 platelets 120,000.  11/25/2013.  WBC 4.2 hemoglobin 10.3  platelets 108.  Valproic acid level LXXVI.5.  Sodium 139 potassium 4.5 BUN 32 creatinine 1.4  11/01/2014.  Valproic acid level LXXXVIII.  10/28/2014.  WBC 4.4 hemoglobin 10.5 platelets 175.  Sodium 142 potassium 3.7 BUN 25 creatinine 1.18.  Magnesium level II.0.  Depakote level XCVI.5.    10/27/2014.  Sodium 141 potassium 2.9 BUN 22 creatinine 1.11.  Depakote level 93.8.  10/16/2014.  WBC 10.1 hemoglobin 8.6 platelets 187.  Liver function tests within normal limits except albumin of 3.3.    .    ASSESSMENT/PLAN:      history of left leg DVT-  He has completed an extended courses Xarelto continues on aspirin                                                      Thrombocytopenia-platelets appear to have rebounded-Depakote has been titrated down-will update lab  Renal insufficiency-creatinine most recently 1.30--it appears to be relatively baseline Will update this.  Diabetes type 2-most recent hemoglobin A1c 7.8 this has trended down-I am concerned about his lower blood sugars in the morning although this is somewhat challenging with higher blood sugars later in the day secondary to risk of hypoglycemia will reduce his Lantus down to 17 units and continue the NovoLog with meals I suspect this will be titrated to some extent in the near future  but with his  low blood sugars  would like to avoid hypoglycemia       Hypertension- This appears to be stable he is on lisinopril 40 mg a day hydrochlorothiazide 12.5 mg a day   Question seizure disorder-he is on Depakote- This is been titrated down there been no recent reports of seizures   Hyperlipidemia- He is no longer on a statin secondary to elevated liver function tests which appear to have normalized we will update his liver function tests I would be hesitant to restart t a statin secondary to this-as well as with his comorbidities and relatively advanced age   Dementia with behaviors-this appears to  be stable apparently this was an issue at assisted living but his stay here behavior wise has been quite unremarkable he is pleasant I do not really note any issues per nursing--he  apparently does have a history of intermittent syncopal episodes which are fairly rare as noted above  History of unresponsive episodes-as noted above occasionally apparently he will have this with no etiology found  He has not had these in some time however.  Weight loss He has rebounded back to his baseline is eating well appears to be doing well in this regards   Anemia macrocytic-this appears to be trending up he has been started on folic acid-will update CBC  [id number]--of note greater than 40 minutes spent assessing patient-discussing his status with nursing staff  reviewing his chart-and coordinating and formulating a plan of care for numerous diagnoses-of note greater than 50% of time spent coordinating plan of care

## 2015-07-27 ENCOUNTER — Encounter (HOSPITAL_COMMUNITY)
Admission: AD | Admit: 2015-07-27 | Discharge: 2015-07-27 | Disposition: A | Discharge status: Home / Self Care | Payer: Medicare PPO | Source: Skilled Nursing Facility | Attending: Internal Medicine | Admitting: Internal Medicine

## 2015-07-27 DIAGNOSIS — N183 Chronic kidney disease, stage 3 (moderate): Secondary | ICD-10-CM | POA: Insufficient documentation

## 2015-07-27 LAB — CBC WITH DIFFERENTIAL/PLATELET
BASOS ABS: 0 10*3/uL (ref 0.0–0.1)
Basophils Relative: 1 %
EOS PCT: 5 %
Eosinophils Absolute: 0.2 10*3/uL (ref 0.0–0.7)
HEMATOCRIT: 30.4 % — AB (ref 39.0–52.0)
Hemoglobin: 9.6 g/dL — ABNORMAL LOW (ref 13.0–17.0)
LYMPHS ABS: 1.5 10*3/uL (ref 0.7–4.0)
Lymphocytes Relative: 40 %
MCH: 32.4 pg (ref 26.0–34.0)
MCHC: 31.6 g/dL (ref 30.0–36.0)
MCV: 102.7 fL — AB (ref 78.0–100.0)
Monocytes Absolute: 0.5 10*3/uL (ref 0.1–1.0)
Monocytes Relative: 14 %
NEUTROS ABS: 1.5 10*3/uL — AB (ref 1.7–7.7)
Neutrophils Relative %: 40 %
Platelets: 162 10*3/uL (ref 150–400)
RBC: 2.96 MIL/uL — AB (ref 4.22–5.81)
RDW: 13.8 % (ref 11.5–15.5)
WBC: 3.8 10*3/uL — AB (ref 4.0–10.5)

## 2015-07-27 LAB — COMPREHENSIVE METABOLIC PANEL
ALBUMIN: 2.9 g/dL — AB (ref 3.5–5.0)
ALT: 21 U/L (ref 17–63)
ANION GAP: 7 (ref 5–15)
AST: 17 U/L (ref 15–41)
Alkaline Phosphatase: 40 U/L (ref 38–126)
BILIRUBIN TOTAL: 0.6 mg/dL (ref 0.3–1.2)
BUN: 29 mg/dL — AB (ref 6–20)
CHLORIDE: 106 mmol/L (ref 101–111)
CO2: 30 mmol/L (ref 22–32)
Calcium: 8.1 mg/dL — ABNORMAL LOW (ref 8.9–10.3)
Creatinine, Ser: 1.32 mg/dL — ABNORMAL HIGH (ref 0.61–1.24)
GFR calc Af Amer: 57 mL/min — ABNORMAL LOW (ref >60)
GFR, EST NON AFRICAN AMERICAN: 49 mL/min — AB (ref >60)
GLUCOSE: 104 mg/dL — AB (ref 65–99)
POTASSIUM: 4 mmol/L (ref 3.5–5.1)
Sodium: 143 mmol/L (ref 135–145)
TOTAL PROTEIN: 5.7 g/dL — AB (ref 6.5–8.1)

## 2015-08-02 ENCOUNTER — Non-Acute Institutional Stay: Payer: Medicare PPO | Admitting: Internal Medicine

## 2015-08-02 DIAGNOSIS — R635 Abnormal weight gain: Secondary | ICD-10-CM | POA: Diagnosis not present

## 2015-08-02 DIAGNOSIS — D539 Nutritional anemia, unspecified: Secondary | ICD-10-CM | POA: Diagnosis not present

## 2015-08-02 DIAGNOSIS — E1121 Type 2 diabetes mellitus with diabetic nephropathy: Secondary | ICD-10-CM | POA: Diagnosis not present

## 2015-08-02 NOTE — Progress Notes (Signed)
Patient ID: Todd [name], male   DOB: [birth year], 81 y.o.   MRN: [id number]         ACILITY: [facility]    LEVEL OF CARE:   SNF  This is an acute  visit   CHIEF COMPLAINT: Acute visit secondary to weight gain        HISTORY OF PRESENT ILLNESS:   Patient is a pleasant elderly male with weakness  here for rehabilitation and monitoring- He's had a number of issues since his admission here but has been stable for some time.  Several weeks ago he did have a period where he was not eating very well-he lost some weight however his weight has bounced back in fact he is now gained a bit more weight than what his previous baseline was which was in the low 180s as weight was 184 on September 30 is up to 188.2 today.  There does not appear to be increased edema from baseline he does have a history of left lower extremity DVT with some chronic edema of his left leg which is unchanged. I do note he had a cardiac echo done in October 2015 which showed an ejection fraction of 55-60 percent with grade 1 diastolic dysfunction  He has significant dementia and cannot give any review of systems but does not appear to have any increase shortness of breath his vital signs are stable appears to be doing quite well per nursing.       He also has a history of diabetes type 2 He was Lantus 25 units daily at bedtime- But this was reduced recently to 17 units secondary to concerns of some a.m. low blood sugars-recent a.m. sugars appear to be more in the low mid 100s-at noon sugars appear to be in the mid 100s to lower 200s-and at 4 PM appears to be more in the mid 100s to mid 200s.  --      .    y       PAST MEDICAL HISTORY/PROBLEM LIST:                  Recurrent syncope.  Note that he had an admission from 08/18/2014 through 08/20/2014 for this issue, also 08/31/2014 through 09/01/2014 with altered mental status, 10/03/2014 through 10/05/2014 for ?TIA symptoms.  He has also  presented with an unresponsive episode.    Hypertension.    Type 2 diabetes.       Dementia.    Chronic kidney disease with a baseline creatinine of 1.5 was recently improved at 1.3.    Coronary artery disease.   Status post CABG.    Hyperlipidemia. -Now off statins secondary to elevated liver function tests   CURRENT MEDICATIONS:  Discharge medications include:       .       Depakote 250 mg q.12.    Hydrochlorothiazide 12.5 q.d.    Lantus 17 units daily at bedtime.    Lisinopril 40 mg QD.    Vitamin B12, 1000 U daily.    Vitamin D3, 1000 U daily.  Potassium 20 mEq a day  Aspirin 81 mg daily    Folic acid 1 mg daily  NovoLog insulin  5 units before meals meals if CBG greater than 200  SOCIAL HISTORY:                    HOUSING:  Patient previously lived at assisted living [facility]-and apparently there are plans to return there if possible sometime  in the near future.   CODE STATUS:  He does come with a DNR status.    FAMILY HISTORY:   Not currently available.    REVIEW OF SYSTEMS:--Limited secondary to dementia Please see history of present illness to have no complaints of pain shortness of breath chest pain bleeding or bruising or recent syncopal episodes  In general no complaints fever or chills.  Skin-no rashes or itching noted.  Head ears eyes nose mouth and throat-does not complain of any sore throat visual changes.  Cardiac no chest  Pain-- mild left lower extremity edema-this has been chronic.  Respiratory-no cough or shortness of breath noted.  GI-apparently eats well does not complaining of abdominal discomfort nausea or vomiting diarrhea or constipation.  Musculoskeletal-apparently joint pain has not been an issue --is not really complaining of any pain in his left leg.  Neurologic again no syncopal episodes have been noted.  Psych he does have significant dementia but behaviors have been well-controlled he's been pleasant and  cooperative-.   [name]    PHYSICAL EXAMINATION:  Temperature 97.9 pulse 66 respirations 20 blood pressure 140/70 O2 saturation is 100% on room air  GENERAL APPEARANCE:  The patient is not in any distress. He does speak but does not talk a lot  Probably on the severe side of dementia, but no evidence of delirium he is pleasant and follows simple verbal commands.  His skin is warm and dry.--No increased bruising or bleeding noted--   Eyes pupils appear reactive light sclerae   clear --visual acuity appears grossly intact CHEST/RESPIRATORY:  Clear air entry bilaterally-no -labored breathing.   CARDIOVASCULAR:  CARDIAC:   Heart sounds are normal with no carotid bruits l1-2/6 systolic murmur  Regular  rate and rhythm.--Has some baseline lower extremity edema bit more in the left versus the rightt--this did not appear to be grossly changed from baseline.  He does not really appear to have significant coccyx edema    GASTROINTESTINAL:  ABDOMEN:   Soft, nontender.  No masses. Positive bowel sounds   . [name] Musculoskeletal-moves all extremities 4 ---  Is able to stand and ambulate although unsteady SENSATION/STRENGTH:  Strength is normal--he is ambulatory moves all extremities at baseline I did not note any deformities.    Neurologic speech is clear I do not see any lateralizing findings  Psych He is oriented to self will follow simple verbal commands---continues to be pleasant and cooperative during his stay here       Labs  07/27/2015.  Sodium 143 potassium 4 BUN 29 creatinine 1.32.  Albumin 2.9 otherwise liver function tests within normal limits.  WBC 3.8 hemoglobin 9.6 platelets 162-MCV 102.7    06/27/2015.  Sodium 142 potassium 4 BUN 28 creatinine 1.3.  Albumin 3.0 otherwise liver function tests within normal limits.  CBC 4.5 hemoglobin 9.9 platelets 214.  Valproic acid level XXXIV     06/01/2015.  WBC 4.1 hemoglobin 11.4 platelets 127.  05/26/2015.  Sodium 141  potassium 4.3 BUN 31 creatinine 1.31.  AST 44-albumin 3.0 otherwise liver function tests within normal limits  04/08/2015.  Valproic acid 43.  Hemoglobin A1c 7.8 which actually is improvement from 3 months ago when it was 9.2 and 6 months ago when it was 12.3.  03/16/2015.  WBC 5.9 hemoglobin 10.2 platelets 161.  03/23/2015.  Sodium 142 potassium 3.8 BUN 20 creatinine 1.17.  Alb-3.2-otherwise liver function tests within normal limits.    12/23/2014.  WBC 4.5 hemoglobin 10.9 platelets 162.  Sodium 138 potassium 4.3  BUN 28 creatinine 1.19.  Liver function tests within normal limits  Cholesterol 189 triglycerides 101 HDL 44 LDL 125.  Hemoglobin A1c 9.2 Depakote level LXVII.6  .  11/29/2014.  WBC 4.2 hemoglobin 11.7 platelets 182  Nov 26 2013.  WBC 4.3 hemoglobin 10.5 platelets 120,000.  11/25/2013.  WBC 4.2 hemoglobin 10.3 platelets 108.  Valproic acid level LXXVI.5.  Sodium 139 potassium 4.5 BUN 32 creatinine 1.4  11/01/2014.  Valproic acid level LXXXVIII.  10/28/2014.  WBC 4.4 hemoglobin 10.5 platelets 175.  Sodium 142 potassium 3.7 BUN 25 creatinine 1.18.  Magnesium level II.0.  Depakote level XCVI.5.    10/27/2014.  Sodium 141 potassium 2.9 BUN 22 creatinine 1.11.  Depakote level 93.8.  10/16/2014.  WBC 10.1 hemoglobin 8.6 platelets 187.  Liver function tests within normal limits except albumin of 3.3.    .    ASSESSMENT/PLAN:  Weight gain-patient does appear to be gaining weight although edema does not appear grossly changing clinically he is stable-I do note a history of grade 1 diastolic CHF will obtain a B NP tomorrow and continue to monitor again he appears to be at baseline and actually doing quite well eating well       Diabetes type 2-most recent hemoglobin A1c 7.8 this has trended down-I Blood sugars as noted above-at this point will monitor would like to avoid hypoglycemia-he continues with NovoLog at meals as  well-.  Continues on Lantus 17 units daily    #3 anemia-macrocytic he's been started on folic acid hemoglobin appears to have stabilized currently 9.6   [id number]

## 2015-08-03 ENCOUNTER — Encounter (HOSPITAL_COMMUNITY)
Admission: AD | Admit: 2015-08-03 | Discharge: 2015-08-03 | Disposition: A | Discharge status: Home / Self Care | Payer: Medicare PPO | Source: Skilled Nursing Facility | Attending: Internal Medicine | Admitting: Internal Medicine

## 2015-08-03 DIAGNOSIS — Z79899 Other long term (current) drug therapy: Secondary | ICD-10-CM | POA: Diagnosis present

## 2015-08-03 LAB — BRAIN NATRIURETIC PEPTIDE: B NATRIURETIC PEPTIDE 5: 475 pg/mL — AB (ref 0.0–100.0)

## 2015-09-01 ENCOUNTER — Encounter (HOSPITAL_COMMUNITY)
Admission: AD | Admit: 2015-09-01 | Discharge: 2015-09-01 | Disposition: A | Discharge status: Home / Self Care | Payer: Medicare PPO | Source: Skilled Nursing Facility | Attending: Internal Medicine | Admitting: Internal Medicine

## 2015-09-01 DIAGNOSIS — N390 Urinary tract infection, site not specified: Secondary | ICD-10-CM | POA: Diagnosis present

## 2015-09-01 LAB — URINE MICROSCOPIC-ADD ON

## 2015-09-01 LAB — URINALYSIS, ROUTINE W REFLEX MICROSCOPIC
BILIRUBIN URINE: NEGATIVE
Glucose, UA: 100 mg/dL — AB
KETONES UR: NEGATIVE mg/dL
Nitrite: POSITIVE — AB
PH: 5.5 (ref 5.0–8.0)
Protein, ur: NEGATIVE mg/dL
SPECIFIC GRAVITY, URINE: 1.025 (ref 1.005–1.030)
UROBILINOGEN UA: 0.2 mg/dL (ref 0.0–1.0)

## 2015-09-02 ENCOUNTER — Encounter: Payer: Self-pay | Admitting: Internal Medicine

## 2015-09-02 ENCOUNTER — Non-Acute Institutional Stay (SKILLED_NURSING_FACILITY): Payer: Medicare PPO | Admitting: Internal Medicine

## 2015-09-02 DIAGNOSIS — I10 Essential (primary) hypertension: Secondary | ICD-10-CM | POA: Diagnosis not present

## 2015-09-02 DIAGNOSIS — E1121 Type 2 diabetes mellitus with diabetic nephropathy: Secondary | ICD-10-CM

## 2015-09-02 DIAGNOSIS — N189 Chronic kidney disease, unspecified: Secondary | ICD-10-CM

## 2015-09-02 DIAGNOSIS — F0391 Unspecified dementia with behavioral disturbance: Secondary | ICD-10-CM | POA: Diagnosis not present

## 2015-09-02 NOTE — Progress Notes (Signed)
Patient ID: Todd [name], male   DOB: August 05, 1932, 81 y.o.   MRN: [id number]         ACILITY: [facility]    LEVEL OF CARE:   SNF  This is a routine--  visit   CHIEF COMPLAINT:  Medical management of chronic issues including weakness dementia hypertension history renal insufficiency- left lower leg DVT-diabetes type 2-anemia-      HISTORY OF PRESENT ILLNESS:   Patient is a pleasant elderly male with weakness  here for rehabilitation and monitoring--he had been hospitalized for altered mental status lethargy which apparently improved during  hospitalization he was empirically started apparently on Depakote for concerns he may be having seizures. After his discharge here He was found  to have increased edema of his left lower leg and  Doppler ultrasound was positive for DVT nonocclusive thrombus  on the proximal and distal left femoral vein and within the left popliteal vein he has completed a six-month course of Xarelto is now on aspirin.     This is complicated somewhat with a history of thrombocytopenia--this was thought possibly at least partially related to his Depakote-Dr. Leanord Hawkingobson did titrate down his Depakote and appears his platelets have normalize most recently 162,000 on most recent lab       he does have a history of hypertension he is on hydrochlorothiazide 12.5 this was reduced from 25 secondary to renal issues--also on lisinopril 40 mg a day-. It was 140/70 manually today previous 1 158/68-we will order this every day do see where we stand   He also has a history of diabetes type 2  He is on Lantus 15 units daily this has been titrated down somewhat to lower blood sugars he is also on NovoLog 5 units 3 times a day if CBG is greater than 200-morning blood sugars appear to be in the lower mid 100s I do see a couple in the 70s-at noon sugars run in the mid 100s to mid 200s-at 4:30 appears to run mainly in the 200 range and this is the case at night as well  although overnight he appears to drop some  Patient did have some weight loss several months ago however his appetite has rebounded and his weight is back at baseline at 183.8 which is encouraging.  Another issue patient recently had his elevated liver function tests these have normalized his statin was discontinued and again his Depakote was decreased as well.  Another issue is his anemia this is macrocytic  hemoglobin did drop significantly into the eights-Dr. Leanord Hawkingobson did start him on folic acid supplementation and most recent hemoglobin was 9.6.  Patient occasionally will have periods of unresponsiveness that are transitory has gone to the ER for this before-is not had any recent episodes however--there is no true etiology found question possibly seizures-   Nursing staff does report these had a couple falls here recently and they feel he tends to have these when he has a urinary tract infection-- A urinalysis is positive for nitrates small leukocytes too numerous to count WBCs and many bacteria   .    y       PAST MEDICAL HISTORY/PROBLEM LIST:                  Recurrent syncope.  Note that he had an admission from 08/18/2014 through 08/20/2014 for this issue, also 08/31/2014 through 09/01/2014 with altered mental status, 10/03/2014 through 10/05/2014 for ?TIA symptoms.  He has also presented with an unresponsive episode.  Hypertension.    Type 2 diabetes.       Dementia.    Chronic kidney disease with a baseline creatinine of 1.5 was recently improved at 1.3.    Coronary artery disease.   Status post CABG.    Hyperlipidemia. -Now off statins secondary to elevated liver function tests   CURRENT MEDICATIONS:  Discharge medications include:       .       Depakote 250 mg q.12.    Hydrochlorothiazide 12.5 q.d.    Lantus 15 units daily at bedtime.    Lisinopril 40 mg QD.    Vitamin B12, 1000 U daily.    Vitamin D3, 1000 U daily.  Potassium 20 mEq a  day  Aspirin 81 mg daily    Folic acid 1 mg daily  NovoLog insulin  5 units before meals meals if CBG greater than 200  SOCIAL HISTORY:                    HOUSING:  Patient previously lived at assisted living [facility]-and apparently there are plans to return there if possible sometime in the near future.   CODE STATUS:  He does come with a DNR status.    FAMILY HISTORY:   Not currently available.    REVIEW OF SYSTEMS:--Limited secondary to dementia Please see history of present illness to have no complaints of pain shortness of breath chest pain bleeding or bruising or recent syncopal episodes Has had some recent falls without syncope no apparent injuries no complaints of dizziness  In general no complaints fever or chills.  Skin-no rashes or itching noted.  Head ears eyes nose mouth and throat-does not complain of any sore throat visual changes.  Cardiac no chest  Pain-- mild left lower extremity edema-this has been chronic.  Respiratory-no cough or shortness of breath noted.  GI-apparently eats well does not complaining of abdominal discomfort nausea or vomiting diarrhea or constipation.  Musculoskeletal-apparently joint pain has not been an issue --no apparent complaints of pain after recent falls.  Neurologic again no syncopal episodes have been noted.  Psych he does have significant dementia but behaviors have been well-controlled he's been pleasant and cooperative-.   [name]    PHYSICAL EXAMINATION:  Is afebrile pulse 62 respirations 18 blood pressure 140/70 taken manually weight is stable at 183.8  GENERAL APPEARANCE:  The patient is not in any distress. He does speak but does not talk a lot  Probably on the severe side of dementia, but no evidence of delirium he is pleasant and follows simple verbal commands.--Sitting comfortably in his chair  His skin is warm and dry.--No increased bruising or bleeding noted--   Eyes pupils appear reactive light sclerae   clear  --visual acuity appears grossly intact CHEST/RESPIRATORY:  Clear air entry bilaterally-no -labored breathing.   CARDIOVASCULAR:  CARDIAC:   Heart sounds are normal with no carotid bruits l1-2/6 systolic murmur  Regular  rate and rhythm.--Has some baseline lower extremity edema bit more in the left versus the rightt   GASTROINTESTINAL:  ABDOMEN:   Soft, nontender.  No masses. Positive bowel sounds   . [name] Musculoskeletal-moves all extremities 4 ---  Is able to stand and ambulate although unsteady--I do not note any deformities or pain with range of motion of his upper and  lower extremities SENSATION/STRENGTH:  Strength is normal--he is ambulatory moves all extremities at baseline I did not note any deformities.    Neurologic speech is clear I do not  see any lateralizing findings  Psych He is oriented to self will follow simple verbal commands  With prompting---continues to be pleasant and cooperative during his stay here       Labs  07/27/2015.  WBC 3.8 hemoglobin 9.6 platelets 162.  Sodium 143 potassium 4 BUN 29 creatinine 1.32.  Albumin 2.9-.  08/03/2015-BNP 475    06/27/2015.  Sodium 142 potassium 4 BUN 28 creatinine 1.3.  Albumin 3.0 otherwise liver function tests within normal limits.  CBC 4.5 hemoglobin 9.9 platelets 214.  Valproic acid level XXXIV     06/01/2015.  WBC 4.1 hemoglobin 11.4 platelets 127.  05/26/2015.  Sodium 141 potassium 4.3 BUN 31 creatinine 1.31.  AST 44-albumin 3.0 otherwise liver function tests within normal limits  04/08/2015.  Valproic acid 43.  Hemoglobin A1c 7.8 which actually is improvement from 3 months ago when it was 9.2 and 6 months ago when it was 12.3.  03/16/2015.  WBC 5.9 hemoglobin 10.2 platelets 161.  03/23/2015.  Sodium 142 potassium 3.8 BUN 20 creatinine 1.17.  Alb-3.2-otherwise liver function tests within normal limits.    12/23/2014.  WBC 4.5 hemoglobin 10.9 platelets 162.  Sodium 138 potassium  4.3 BUN 28 creatinine 1.19.  Liver function tests within normal limits  Cholesterol 189 triglycerides 101 HDL 44 LDL 125.  Hemoglobin A1c 9.2 Depakote level LXVII.6  .  11/29/2014.  WBC 4.2 hemoglobin 11.7 platelets 182  Nov 26 2013.  WBC 4.3 hemoglobin 10.5 platelets 120,000.  11/25/2013.  WBC 4.2 hemoglobin 10.3 platelets 108.  Valproic acid level LXXVI.5.  Sodium 139 potassium 4.5 BUN 32 creatinine 1.4  11/01/2014.  Valproic acid level LXXXVIII.  10/28/2014.  WBC 4.4 hemoglobin 10.5 platelets 175.  Sodium 142 potassium 3.7 BUN 25 creatinine 1.18.  Magnesium level II.0.  Depakote level XCVI.5.    10/27/2014.  Sodium 141 potassium 2.9 BUN 22 creatinine 1.11.  Depakote level 93.8.  10/16/2014.  WBC 10.1 hemoglobin 8.6 platelets 187.  Liver function tests within normal limits except albumin of 3.3.    .    ASSESSMENT/PLAN:      history of left leg DVT-  He has completed an extended courses Xarelto continues on aspirin--this has been stable                                                      Thrombocytopenia-platelets appear to have rebounded-Depakote has been titrated down-will update lab  Renal insufficiency-creatinine most recently 1.32--it appears to be relatively baseline Will update this.  Diabetes type 2-most recent hemoglobin A1c 7.8 this has trended down- Nonetheless his sugars later in the day still. To be elevated some although morning sugars appear to be stable I do see occasional 70s but this is fairly rare will have to encourage an at bedtime snack here since he appears to drop somewhat during the evening.  We will increase the NovoLog with meals up to 7 units if CBG is greater than 200 and see if this will have some mitigating effect on blood sugars later in the day-also well update hemoglobin A1c       Hypertension-  he is on lisinopril 40 mg a day hydrochlorothiazide 12.5 mg a day--blood sugars appear to be somewhat  elevated but would like to get more readings before making medication changes--manually today was 140/70   Question seizure disorder-he is  on Depakote- This is been titrated down there been no recent reports of seizures   Hyperlipidemia- He is no longer on a statin secondary to elevated liver function tests--- I would be hesitant to restart t a statin secondary to this-as well as with his comorbidities and relatively advanced age   Dementia with behaviors-this appears to be stable apparently this was an issue at assisted living but his stay here behavior wise has been quite unremarkable he is pleasant I do not really note any issues per nursing--he apparently does have a history of intermittent syncopal episodes which are fairly rare as noted above   .  Weight loss He has rebounded back to his baseline is eating well appears to be doing well in this regards   Anemia macrocytic-this appears to be  stable  he has been started on folic acid-will update CBC  Suspected UTI-per nursing he does have falls at times and this usually has indicated a UTI -- urinalysis is suspicious of a UTI will start empiric amoxicillin and await final culture results.    CPT  [id number]--of note greater than 35 minutes spent assessing patient-discussing his status with nursing staff  reviewing his chart-and coordinating and formulating a plan of care for numerous diagnoses-of note greater than 50% of time spent coordinating plan of care

## 2015-09-03 ENCOUNTER — Inpatient Hospital Stay (HOSPITAL_COMMUNITY): Admission: RE | Admit: 2015-09-03 | Payer: Self-pay | Source: Skilled Nursing Facility | Admitting: Internal Medicine

## 2015-09-03 ENCOUNTER — Encounter (HOSPITAL_COMMUNITY)
Admission: RE | Admit: 2015-09-03 | Discharge: 2015-09-03 | Disposition: A | Discharge status: Home / Self Care | Payer: Medicare PPO | Source: Skilled Nursing Facility | Attending: Internal Medicine | Admitting: Internal Medicine

## 2015-09-03 DIAGNOSIS — N390 Urinary tract infection, site not specified: Secondary | ICD-10-CM | POA: Diagnosis not present

## 2015-09-03 LAB — COMPREHENSIVE METABOLIC PANEL
ALT: 20 U/L (ref 17–63)
AST: 25 U/L (ref 15–41)
Albumin: 3.2 g/dL — ABNORMAL LOW (ref 3.5–5.0)
Alkaline Phosphatase: 53 U/L (ref 38–126)
Anion gap: 7 (ref 5–15)
BUN: 28 mg/dL — ABNORMAL HIGH (ref 6–20)
CHLORIDE: 105 mmol/L (ref 101–111)
CO2: 27 mmol/L (ref 22–32)
CREATININE: 1.31 mg/dL — AB (ref 0.61–1.24)
Calcium: 8.5 mg/dL — ABNORMAL LOW (ref 8.9–10.3)
GFR calc non Af Amer: 49 mL/min — ABNORMAL LOW (ref >60)
GFR, EST AFRICAN AMERICAN: 57 mL/min — AB (ref >60)
Glucose, Bld: 183 mg/dL — ABNORMAL HIGH (ref 65–99)
Potassium: 4.2 mmol/L (ref 3.5–5.1)
SODIUM: 139 mmol/L (ref 135–145)
Total Bilirubin: 0.3 mg/dL (ref 0.3–1.2)
Total Protein: 6.4 g/dL — ABNORMAL LOW (ref 6.5–8.1)

## 2015-09-03 LAB — CBC WITH DIFFERENTIAL/PLATELET
Basophils Absolute: 0 K/uL (ref 0.0–0.1)
Basophils Relative: 0 %
Eosinophils Absolute: 0.3 K/uL (ref 0.0–0.7)
Eosinophils Relative: 7 %
HCT: 31.6 % — ABNORMAL LOW (ref 39.0–52.0)
Hemoglobin: 10.2 g/dL — ABNORMAL LOW (ref 13.0–17.0)
Lymphocytes Relative: 28 %
Lymphs Abs: 1.3 K/uL (ref 0.7–4.0)
MCH: 31.3 pg (ref 26.0–34.0)
MCHC: 32.3 g/dL (ref 30.0–36.0)
MCV: 96.9 fL (ref 78.0–100.0)
Monocytes Absolute: 0.7 K/uL (ref 0.1–1.0)
Monocytes Relative: 14 %
Neutro Abs: 2.4 K/uL (ref 1.7–7.7)
Neutrophils Relative %: 51 %
Platelets: 128 K/uL — ABNORMAL LOW (ref 150–400)
RBC: 3.26 MIL/uL — ABNORMAL LOW (ref 4.22–5.81)
RDW: 12.8 % (ref 11.5–15.5)
WBC: 4.7 K/uL (ref 4.0–10.5)

## 2015-09-03 LAB — VALPROIC ACID LEVEL: VALPROIC ACID LVL: 47 ug/mL — AB (ref 50.0–100.0)

## 2015-09-04 LAB — URINE CULTURE: Culture: >=100000

## 2015-09-05 LAB — HEMOGLOBIN A1C
HEMOGLOBIN A1C: 7.9 % — AB (ref 4.8–5.6)
MEAN PLASMA GLUCOSE: 180 mg/dL

## 2015-10-02 ENCOUNTER — Encounter (HOSPITAL_COMMUNITY): Payer: Self-pay | Admitting: *Deleted

## 2015-10-02 ENCOUNTER — Emergency Department (HOSPITAL_COMMUNITY)
Admission: EM | Admit: 2015-10-02 | Discharge: 2015-10-02 | Disposition: A | Discharge status: Home / Self Care | Payer: Medicare PPO | Attending: Emergency Medicine | Admitting: Emergency Medicine

## 2015-10-02 ENCOUNTER — Emergency Department (HOSPITAL_COMMUNITY): Payer: Medicare PPO

## 2015-10-02 DIAGNOSIS — S0101XA Laceration without foreign body of scalp, initial encounter: Secondary | ICD-10-CM | POA: Diagnosis not present

## 2015-10-02 DIAGNOSIS — Z87891 Personal history of nicotine dependence: Secondary | ICD-10-CM | POA: Insufficient documentation

## 2015-10-02 DIAGNOSIS — N189 Chronic kidney disease, unspecified: Secondary | ICD-10-CM | POA: Diagnosis not present

## 2015-10-02 DIAGNOSIS — M1990 Unspecified osteoarthritis, unspecified site: Secondary | ICD-10-CM | POA: Diagnosis not present

## 2015-10-02 DIAGNOSIS — W19XXXA Unspecified fall, initial encounter: Secondary | ICD-10-CM

## 2015-10-02 DIAGNOSIS — Z794 Long term (current) use of insulin: Secondary | ICD-10-CM | POA: Diagnosis not present

## 2015-10-02 DIAGNOSIS — Z951 Presence of aortocoronary bypass graft: Secondary | ICD-10-CM | POA: Insufficient documentation

## 2015-10-02 DIAGNOSIS — S0990XA Unspecified injury of head, initial encounter: Secondary | ICD-10-CM | POA: Diagnosis present

## 2015-10-02 DIAGNOSIS — Y9289 Other specified places as the place of occurrence of the external cause: Secondary | ICD-10-CM | POA: Insufficient documentation

## 2015-10-02 DIAGNOSIS — E119 Type 2 diabetes mellitus without complications: Secondary | ICD-10-CM | POA: Diagnosis not present

## 2015-10-02 DIAGNOSIS — Y998 Other external cause status: Secondary | ICD-10-CM | POA: Diagnosis not present

## 2015-10-02 DIAGNOSIS — Z79899 Other long term (current) drug therapy: Secondary | ICD-10-CM | POA: Insufficient documentation

## 2015-10-02 DIAGNOSIS — W01198A Fall on same level from slipping, tripping and stumbling with subsequent striking against other object, initial encounter: Secondary | ICD-10-CM | POA: Insufficient documentation

## 2015-10-02 DIAGNOSIS — Y9389 Activity, other specified: Secondary | ICD-10-CM | POA: Insufficient documentation

## 2015-10-02 DIAGNOSIS — I129 Hypertensive chronic kidney disease with stage 1 through stage 4 chronic kidney disease, or unspecified chronic kidney disease: Secondary | ICD-10-CM | POA: Insufficient documentation

## 2015-10-02 DIAGNOSIS — Z7982 Long term (current) use of aspirin: Secondary | ICD-10-CM | POA: Diagnosis not present

## 2015-10-02 DIAGNOSIS — F0390 Unspecified dementia without behavioral disturbance: Secondary | ICD-10-CM | POA: Insufficient documentation

## 2015-10-02 NOTE — ED Notes (Signed)
Pt brought in by [facility] for c/o fall and laceration to back of head; [facility] was told pt fell and hit head on wall; pt's only complaint is headache

## 2015-10-02 NOTE — ED Notes (Signed)
Patient assisted to restroom with standby assist. Patient back in room. Cleaned dried and running blood from patient's head. EDP Dr Adriana Simasook putting stapling cut on his head. Covered staples with 4x4s and wrapped with bandage.

## 2015-10-02 NOTE — Discharge Instructions (Signed)
CT scan of head was normal. He can shower. Keep wound clean and dry. Staples out next Monday.

## 2015-10-02 NOTE — ED Provider Notes (Signed)
CSN: [id number]     Arrival date & time 10/02/15  1952 History  By signing my name below, I, Budd Palmer, attest that this documentation has been prepared under the direction and in the presence of Donnetta Hutching, MD. Electronically Signed: Budd Palmer, ED Scribe. 10/02/2015. 8:15 PM.      Chief Complaint  Patient presents with  . Head Laceration   The history is provided by the patient. No language interpreter was used.   HPI Comments: Level 5 Caveat (Dementia) Todd [name] is a 81 y.o. male with a PMHx of HTN, dementia, DM, and chronic kidney disease, as well as a PSHx of CABG brought in by ambulance, who presents to the Emergency Department complaining of a laceration to the occipital scalp sustained after a fall that occurred just PTA. Pt states he does not recall how the incident occurred. He reports associated HA. Pt denies neck pain, leg pain, hip pain, and abdominal pain.  Past Medical History  Diagnosis Date  . Elevated cholesterol   . Hypertension   . Type II diabetes mellitus (HCC)   . Dementia     "don't know stage or type" (08/18/2014)  . Arthritis     "probably"  . Chronic kidney disease     "? stage" (08/18/2014)   Past Surgical History  Procedure Laterality Date  . Tonsillectomy    . Coronary artery bypass graft  ?1994    "CABG X3"  . Cataract extraction     History reviewed. No pertinent family history. Social History  Substance Use Topics  . Smoking status: Former Smoker    Types: Pipe  . Smokeless tobacco: Never Used     Comment: "quit smoking a pipe in the 80's"  . Alcohol Use: No    Review of Systems  Unable to perform ROS: Dementia    Allergies  Review of patient's allergies indicates no known allergies.  Home Medications   Prior to Admission medications   Medication Sig Start Date End Date Taking? Authorizing Provider  aspirin 81 MG tablet Take 81 mg by mouth daily.   Yes Historical Provider, MD  Cholecalciferol (VITAMIN D-3) 1000 UNITS  CAPS Take 1,000 Units by mouth daily.   Yes Historical Provider, MD  divalproex (DEPAKOTE ER) 250 MG 24 hr tablet Take 250 mg by mouth 2 (two) times daily.   Yes Historical Provider, MD  folic acid (FOLVITE) 1 MG tablet Take 1 mg by mouth daily.   Yes Historical Provider, MD  hydrochlorothiazide (MICROZIDE) 12.5 MG capsule Take 12.5 mg by mouth daily.   Yes Historical Provider, MD  insulin aspart (NOVOLOG) 100 UNIT/ML injection Inject 7 Units into the skin 3 (three) times daily before meals.   Yes Historical Provider, MD  insulin glargine (LANTUS) 100 UNIT/ML injection Inject 15 Units into the skin at bedtime.    Yes Historical Provider, MD  lisinopril (PRINIVIL,ZESTRIL) 30 MG tablet Take 30 mg by mouth daily.   Yes Historical Provider, MD  potassium chloride (KLOR-CON) 20 MEQ packet Take 20 mEq by mouth daily.    Yes Historical Provider, MD  atorvastatin (LIPITOR) 80 MG tablet Take 1 tablet (80 mg total) by mouth daily at 6 PM. Patient not taking: Reported on 10/16/2014 10/05/14   Stark Bray, MD  divalproex (DEPAKOTE) 500 MG DR tablet Take 1 tablet (500 mg total) by mouth every 12 (twelve) hours. Patient taking differently: Take 250 mg by mouth every 12 (twelve) hours.  10/18/14   Stark Bray, MD  There were no vitals taken for this visit. Physical Exam  Constitutional: He appears well-developed and well-nourished.  Pleasant, but demented.   HENT:  2.5 cm vertical laceration on the occipital scalp  Eyes: Conjunctivae and EOM are normal. Pupils are equal, round, and reactive to light.  Neck: Normal range of motion. Neck supple.  Cardiovascular: Normal rate and regular rhythm.   Pulmonary/Chest: Effort normal and breath sounds normal.  Abdominal: Soft. Bowel sounds are normal.  Musculoskeletal: Normal range of motion.  Neurological: He is alert.  Skin: Skin is warm and dry.  Psychiatric:  demented  Nursing note and vitals reviewed.   ED Course  .[name].Laceration Repair Date/Time:  10/02/2015 10:09 PM Performed by: Donnetta HutchingOOK, Keene Gilkey Authorized by: Donnetta HutchingOOK, Avanell Banwart Consent: Verbal consent obtained. Written consent not obtained. Risks and benefits: risks, benefits and alternatives were discussed Consent given by: patient Test results: test results available and properly labeled Comments: 2150:   Wound cleaned with saline. No foreign body. Staples 4. Turban dressing. Patient tolerated procedure well.     COORDINATION OF CARE: 8:11 PM - Discussed plans to order a head CT, as well as to numb, cleanse, and staple the wound. Pt advised of plan for treatment and pt agrees.  Labs Review Labs Reviewed - No data to display  Imaging Review Ct Head Wo Contrast  10/02/2015  CLINICAL DATA:  Laceration to the occiput after fall. Headache. Initial encounter. EXAM: CT HEAD WITHOUT CONTRAST TECHNIQUE: Contiguous axial images were obtained from the base of the skull through the vertex without intravenous contrast. COMPARISON:  CT of the head performed 10/16/2014, and MRI of the brain performed 10/04/2014 FINDINGS: There is no evidence of acute infarction, mass lesion, or intra- or extra-axial hemorrhage on CT. Prominence of the ventricles and sulci reflects mild to moderate cortical volume loss. Mild cerebellar atrophy is noted. Scattered periventricular white matter change likely reflects small vessel ischemic microangiopathy. The brainstem and fourth ventricle are within normal limits. The basal ganglia are unremarkable in appearance. The cerebral hemispheres demonstrate grossly normal gray-white differentiation. No mass effect or midline shift is seen. There is no evidence of fracture; visualized osseous structures are unremarkable in appearance. The orbits are within normal limits. There is opacification of the left side of the sphenoid sinus and left ethmoid air cells. The remaining paranasal sinuses and mastoid air cells are well-aerated. Prominent soft tissue swelling is noted at the posterior  vertex. IMPRESSION: 1. No evidence of traumatic intracranial injury or fracture. 2. Prominent soft tissue swelling at the posterior vertex. 3. Mild to moderate cortical volume loss and scattered small vessel ischemic microangiopathy. 4. Opacification of the left side of the sphenoid sinus and left ethmoid air cells. Electronically Signed   By: Roanna RaiderJeffery  Chang M.D.   On: 10/02/2015 21:27   I have personally reviewed and evaluated these images and lab results as part of my medical decision-making.   EKG Interpretation None      MDM   Final diagnoses:  Fall, initial encounter  Laceration of scalp, initial encounter    Patient is alert, although he has dementia. CT scan shows no traumatic intracranial injury or fracture. Laceration repair as above.  I personally performed the services described in this documentation, which was scribed in my presence. The recorded information has been reviewed and is accurate.    Donnetta HutchingBrian Sherrel Shafer, MD 10/02/15 [id number]

## 2015-10-04 ENCOUNTER — Non-Acute Institutional Stay (SKILLED_NURSING_FACILITY): Payer: Medicare PPO | Admitting: Internal Medicine

## 2015-10-04 DIAGNOSIS — F0391 Unspecified dementia with behavioral disturbance: Secondary | ICD-10-CM

## 2015-10-04 DIAGNOSIS — N189 Chronic kidney disease, unspecified: Secondary | ICD-10-CM | POA: Diagnosis not present

## 2015-10-04 DIAGNOSIS — S0190XS Unspecified open wound of unspecified part of head, sequela: Secondary | ICD-10-CM | POA: Diagnosis not present

## 2015-10-04 DIAGNOSIS — E118 Type 2 diabetes mellitus with unspecified complications: Secondary | ICD-10-CM | POA: Diagnosis not present

## 2015-10-04 DIAGNOSIS — I10 Essential (primary) hypertension: Secondary | ICD-10-CM | POA: Diagnosis not present

## 2015-10-04 NOTE — Progress Notes (Signed)
Patient ID: Todd [name], male   DOB: 1931-11-29, 81 y.o.   MRN: [id number]          ACILITY: [facility]    LEVEL OF CARE:   SNF  This is a routine-acute-  visit   CHIEF COMPLAINT:  Medical management of chronic issues including weakness dementia hypertension history renal insufficiency- left lower leg DVT-diabetes type 2-anemia- Acute visit follow up fall with head laceration requiring ER visit       HISTORY OF PRESENT ILLNESS:   Patient is a pleasant elderly male with weakness  here for rehabilitation and monitoring--he had been hospitalized for altered mental status lethargy which apparently improved during  hospitalization he was empirically started apparently on Depakote for concerns he may be having seizures. After his discharge here He was found  to have increased edema of his left lower leg and  Doppler ultrasound was positive for DVT nonocclusive thrombus  on the proximal and distal left femoral vein and within the left popliteal vein he has completed a six-month course of Xarelto is now on aspirin.     This is complicated somewhat with a history of thrombocytopenia--this was thought possibly at least partially related to his Depakote-Dr. Leanord Hawking did titrate down his Depakote and appears his platelets have stabilized ranging between 160 and 1 20,000 on recent labs-most recently 128,000 on lab done last month we will update this has been no increased bruising or bleeding       he does have a history of hypertension he is on hydrochlorothiazide 12.5 this was reduced from 25 secondary to renal issues--also on lisinopril 40 mg a day-. Listed blood pressures appear to be somewhat elevated most recently 161/91-148/75-I got 150/78 this evening-   He also has a history of diabetes type 2  He is on Lantus 15 units daily this has been titrated down somewhat to lower blood sugars he is also on NovoLog 7units 3 times a day if CBG is greater than 200-  Blood sugars in  the morning range from 70 up to the mid 100s the 70 is quite unusual--at noon blood sugars run around 200 average-at 4:30 appears to be somewhat in the lower 200s and this appears to be the case more so at at bedtime as well however appears to sugars do come down significantly overnight.  His hemoglobin A1c most recently 7.9 which is an improvement from 13 approximately a year ago suspect13 reflects poor prior controlled-  Patient did have some weight loss several months ago however his appetite has rebounded and his weight appears to be stable most recently 190.4 I see recent readings ranging between 187.8 and 193.2  Another issue patient recently had his elevated liver function tests these have normalized his statin was discontinued and again his Depakote was decreased as well.  Another issue is his anemia this is macrocytic  hemoglobin did drop significantly into the eights-Dr. Leanord Hawking did start him on folic acid supplementation and most recent hemoglobin was 9 10.2 on 09/03/2015 we will update this as well  Patient occasionally will have periods of unresponsiveness that are transitory has gone to the ER for this before-is not had any recent episodes however--there is no true etiology found question possibly seizures-   He apparently had a fall 2 nights ago and sustained a fairly significant laceration on the back of his head-he did require sutures in the ER CT scan was negative for any acute pathology he has recurrent turn the facility appears to be  at his baseline baseline confusion but not increased from normal-continues to be pleasant although confused   .    y       PAST MEDICAL HISTORY/PROBLEM LIST:                  Recurrent syncope.  Note that he had an admission from 08/18/2014 through 08/20/2014 for this issue, also 08/31/2014 through 09/01/2014 with altered mental status, 10/03/2014 through 10/05/2014 for ?TIA symptoms.  He has also presented with an unresponsive episode.     Hypertension.    Type 2 diabetes.       Dementia.    Chronic kidney disease with a baseline creatinine of 1.5 was recently improved at 1.3.    Coronary artery disease.   Status post CABG.    Hyperlipidemia. -Now off statins secondary to elevated liver function tests   CURRENT MEDICATIONS:  Discharge medications include:       .       Depakote 250 mg q.12.    Hydrochlorothiazide 12.5 q.d.    Lantus 15 units daily at bedtime.    Lisinopril 40 mg QD.    Vitamin B12, 1000 U daily.    Vitamin D3, 1000 U daily.  Potassium 20 mEq a day  Aspirin 81 mg daily    Folic acid 1 mg daily  NovoLog insulin  7 units before meals meals if CBG greater than 200  SOCIAL HISTORY:                    HOUSING:  Patient previously lived at assisted living [facility]-    CODE STATUS:  He does come with a DNR status.    FAMILY HISTORY:   Not currently available.    REVIEW OF SYSTEMS:--Limited secondary to dementia Please see history of present illness to have no complaints of pain shortness of breath chest pain bleeding or bruising or recent syncopal episodes--not complaining of headache Has had some recent falls without syncope  no complaints of dizziness  In general no complaints fever or chills.  Skin-no rashes or itching noted.--Laceration to the back of his head as noted above  Head ears eyes nose mouth and throat-does not complain of any sore throat visual changes.  Cardiac no chest  Pain-- mild left lower extremity edema-this has been chronic.  Respiratory-no cough or shortness of breath noted.  GI-apparently eats well does not complaining of abdominal discomfort nausea or vomiting diarrhea or constipation.  Musculoskeletal-apparently joint pain has not been an issue --no apparent complaints of pain after recent falls.  Neurologic again no syncopal episodes have been noted.  Psych he does have significant dementia but behaviors have been well-controlled he's been pleasant  and cooperative-.   [name].    PHYSICAL EXAMINATION:  He is afebrile pulses 70 respirations 18 blood pressure taken manually 150/78 weight is 190.4 again there is some variability care appears to be relatively stable recently  GENERAL APPEARANCE:  The patient is not in any distress. He does speak but does not talk a lot  Probably on the severe side of dementia, but no evidence of delirium he is pleasant and follows simple verbal commands.--Lying comfortably in bed  His skin is warm and dry.--No increased bruising or bleeding noted--septal area of scalp staples are in place there is some crusting of the laceration I do not see any sign of infection drainage bleeding or increased erythema   Eyes pupils appear reactive light sclerae   clear --visual acuity appears grossly  intact CHEST/RESPIRATORY:  Clear air entry bilaterally-no -labored breathing.   CARDIOVASCULAR:  CARDIAC:   Heart sounds are normal with no carotid bruits l1-2/6 systolic murmur  Regular  rate and rhythm.--Has some baseline lower extremity edema bit more in the left versus the rightt   GASTROINTESTINAL:  ABDOMEN:   Soft, nontender.  No masses. Positive bowel sounds   . [name] Musculoskeletal-moves all extremities 4 ---   -I do not note any deformities or pain with range of motion of his upper and  lower extremities SENSATION/STRENGTH:  Strength is normal--he is ambulatory moves all extremities at baseline I did not note any deformities.    Neurologic speech is clear I do not see any lateralizing findings  Psych He is oriented to self will follow simple verbal commands  With prompting---continues to be pleasant and cooperative during his stay here       Labs  09/03/2015.  Depakote level XLVII.  Sodium 139 potassium 4.2 BUN 28 creatinine 1.31.  Albumin 3.2.  Otherwise liver function tests within normal limits.  WBC 4.7 hemoglobin 10.2 platelets 128.  Hemoglobin A1c 7.9  07/27/2015.  WBC 3.8 hemoglobin 9.6  platelets 162.  Sodium 143 potassium 4 BUN 29 creatinine 1.32.  Albumin 2.9-.  08/03/2015-BNP 475    06/27/2015.  Sodium 142 potassium 4 BUN 28 creatinine 1.3.  Albumin 3.0 otherwise liver function tests within normal limits.  CBC 4.5 hemoglobin 9.9 platelets 214.  Valproic acid level XXXIV     06/01/2015.  WBC 4.1 hemoglobin 11.4 platelets 127.  05/26/2015.  Sodium 141 potassium 4.3 BUN 31 creatinine 1.31.  AST 44-albumin 3.0 otherwise liver function tests within normal limits  04/08/2015.  Valproic acid 43.  Hemoglobin A1c 7.8 which actually is improvement from 3 months ago when it was 9.2 and 6 months ago when it was 12.3.  03/16/2015.  WBC 5.9 hemoglobin 10.2 platelets 161.  03/23/2015.  Sodium 142 potassium 3.8 BUN 20 creatinine 1.17.  Alb-3.2-otherwise liver function tests within normal limits.    12/23/2014.  WBC 4.5 hemoglobin 10.9 platelets 162.  Sodium 138 potassium 4.3 BUN 28 creatinine 1.19.  Liver function tests within normal limits  Cholesterol 189 triglycerides 101 HDL 44 LDL 125.  Hemoglobin A1c 9.2 Depakote level LXVII.6  .  11/29/2014.  WBC 4.2 hemoglobin 11.7 platelets 182  Nov 26 2013.  WBC 4.3 hemoglobin 10.5 platelets 120,000.  11/25/2013.  WBC 4.2 hemoglobin 10.3 platelets 108.  Valproic acid level LXXVI.5.  Sodium 139 potassium 4.5 BUN 32 creatinine 1.4  11/01/2014.  Valproic acid level LXXXVIII.  10/28/2014.  WBC 4.4 hemoglobin 10.5 platelets 175.  Sodium 142 potassium 3.7 BUN 25 creatinine 1.18.  Magnesium level II.0.  Depakote level XCVI.5.        .    ASSESSMENT/PLAN:      history of left leg DVT-  He has completed an extended courses Xarelto continues on aspirin--this has been stable                                                      Thrombocytopenia-platelets appear to have stabilized although again somewhat lower than normal range at times-Depakote has been titrated  down-will update lab  Renal insufficiency-creatinine most recently 1.31--it appears to be relatively baseline Will update this.  Diabetes type 2-most recent hemoglobin A1c 7.9 this is comparable to  previous hemoglobin A1c of 7.8-it has gradually trended down from 13 back in October 2015--has somewhat higher blood sugars later in the day he is on 7 units of NovoLog before meals at this point will monitor -- some variability he does have again lower blood sugars in the morning this appears to come down overnight-would like to avoid hypoglycemia--his hemoglobin A1c is down significantly from a year ago  .          Hypertension-  he is on lisinopril 40 mg a day hydrochlorothiazide 12.5 mg a day--appears to have somewhat elevated systolics although he does have a history of falls which would make me somewhat hesitant to be too aggressive here-at this point will monitor    Question seizure disorder-he is on Depakote- This is been titrated down there been no recent reports of seizures   Hyperlipidemia- He is no longer on a statin secondary to elevated liver function tests--- I would be hesitant to restart t a statin secondary to this-as well as with his comorbidities and relatively advanced age   Dementia with behaviors-this appears to be stable apparently this was an issue at assisted living but his stay here behavior wise has been quite unremarkable he is pleasant I do not really note any issues per nursing--he apparently does have a history of intermittent syncopal episodes which are fairly rare as noted above   .  Weight loss He has rebounded back ---most recent weight of 190 appears to be again of about 5 pounds over the past month although they're variable weights before meals had been as high as 193 recently I also see 187.8 I suspect there is some scale variation here   Anemia macrocytic-this appears to be  stable  he has been started on folic acid-will update CBC  History of  fall with head laceration-he appears to be stable in this regards neurologically is not complaining of any headache or pain there are orders to remove staples at appropriate time   .     [id number]--of note greater than 40 minutes spent assessing patient-discussing his status with nursing staff  reviewing his chart including ER note and workup t-and coordinating and formulating a plan of care for numerous diagnoses-of note greater than 50% of time spent coordinating plan of care

## 2015-10-05 ENCOUNTER — Encounter (HOSPITAL_COMMUNITY)
Admission: AD | Admit: 2015-10-05 | Discharge: 2015-10-05 | Disposition: A | Discharge status: Home / Self Care | Payer: Medicare PPO | Source: Skilled Nursing Facility | Attending: Internal Medicine | Admitting: Internal Medicine

## 2015-10-05 DIAGNOSIS — N183 Chronic kidney disease, stage 3 (moderate): Secondary | ICD-10-CM | POA: Insufficient documentation

## 2015-10-05 DIAGNOSIS — E119 Type 2 diabetes mellitus without complications: Secondary | ICD-10-CM | POA: Diagnosis present

## 2015-10-05 LAB — CBC WITH DIFFERENTIAL/PLATELET
BASOS ABS: 0 10*3/uL (ref 0.0–0.1)
BASOS PCT: 0 %
EOS ABS: 0.3 10*3/uL (ref 0.0–0.7)
Eosinophils Relative: 6 %
HCT: 37 % — ABNORMAL LOW (ref 39.0–52.0)
HEMOGLOBIN: 12.5 g/dL — AB (ref 13.0–17.0)
Lymphocytes Relative: 30 %
Lymphs Abs: 1.3 10*3/uL (ref 0.7–4.0)
MCH: 30.9 pg (ref 26.0–34.0)
MCHC: 33.8 g/dL (ref 30.0–36.0)
MCV: 91.6 fL (ref 78.0–100.0)
MONOS PCT: 10 %
Monocytes Absolute: 0.5 10*3/uL (ref 0.1–1.0)
NEUTROS ABS: 2.4 10*3/uL (ref 1.7–7.7)
NEUTROS PCT: 54 %
PLATELETS: 144 10*3/uL — AB (ref 150–400)
RBC: 4.04 MIL/uL — AB (ref 4.22–5.81)
RDW: 13.7 % (ref 11.5–15.5)
WBC: 4.5 10*3/uL (ref 4.0–10.5)

## 2015-10-05 LAB — COMPREHENSIVE METABOLIC PANEL
ALK PHOS: 55 U/L (ref 38–126)
ALT: 20 U/L (ref 17–63)
AST: 20 U/L (ref 15–41)
Albumin: 3.4 g/dL — ABNORMAL LOW (ref 3.5–5.0)
Anion gap: 7 (ref 5–15)
BILIRUBIN TOTAL: 0.8 mg/dL (ref 0.3–1.2)
BUN: 19 mg/dL (ref 6–20)
CALCIUM: 9 mg/dL (ref 8.9–10.3)
CO2: 27 mmol/L (ref 22–32)
CREATININE: 0.98 mg/dL (ref 0.61–1.24)
Chloride: 103 mmol/L (ref 101–111)
Glucose, Bld: 152 mg/dL — ABNORMAL HIGH (ref 65–99)
Potassium: 4.1 mmol/L (ref 3.5–5.1)
Sodium: 137 mmol/L (ref 135–145)
TOTAL PROTEIN: 6.7 g/dL (ref 6.5–8.1)

## 2015-10-05 LAB — VALPROIC ACID LEVEL: VALPROIC ACID LVL: 34 ug/mL — AB (ref 50.0–100.0)

## 2015-10-14 ENCOUNTER — Encounter: Payer: Self-pay | Admitting: Internal Medicine

## 2015-10-14 ENCOUNTER — Non-Acute Institutional Stay (SKILLED_NURSING_FACILITY): Payer: Medicare PPO | Admitting: Internal Medicine

## 2015-10-14 DIAGNOSIS — N189 Chronic kidney disease, unspecified: Secondary | ICD-10-CM

## 2015-10-14 DIAGNOSIS — R609 Edema, unspecified: Secondary | ICD-10-CM | POA: Diagnosis not present

## 2015-10-14 DIAGNOSIS — D696 Thrombocytopenia, unspecified: Secondary | ICD-10-CM | POA: Diagnosis not present

## 2015-10-14 NOTE — Progress Notes (Signed)
Patient ID: Todd [name], male   DOB: [birth year], 81 y.o.   MRN: [id number]           ACILITY: [facility]    LEVEL OF CARE:   SNF  This is a routine-acute-  visit   CHIEF COMPLAINT:  Acute visit follow-up lower extremity edema      HISTORY OF PRESENT ILLNESS:   Patient is a pleasant elderly male with weakness  here for rehabilitation and monitoring--he had been hospitalized for altered mental status lethargy which apparently improved during  hospitalization he was empirically started apparently on Depakote for concerns he may be having seizures. After his discharge here He was found  to have increased edema of his left lower leg and  Doppler ultrasound was positive for DVT nonocclusive thrombus  on the proximal and distal left femoral vein and within the left popliteal vein he has completed a six-month course of Xarelto is now on aspirin.  Nursing staff has left a note about possibly some increased lower extremity edema-however they also note he was sitting off all day suspect this could be dependent related as well.  I do note he did have a cardiac echo done in November 2015 which showed a vigorous ejection fraction of 55-60 percent with grade 1 diastolic dysfunction.  His BNP was 475 on lab done on 08/03/2015.  He is on hydrochlorothiazide low dose this was reduced secondary to history of some renal insufficiency although most recent creatinine of 0.98 BUN of 19 appear to be satisfactory and this lab was done in early December.  Currently has no complaints of shortness of breath or chest pain he is a poor historian secondary to dementia he appears essentially at his basel    PAST MEDICAL HISTORY/PROBLEM LIST:                  Recurrent syncope.  Note that he had an admission from 08/18/2014 through 08/20/2014 for this issue, also 08/31/2014 through 09/01/2014 with altered mental status, 10/03/2014 through 10/05/2014 for ?TIA symptoms.  He has also presented with an  unresponsive episode.    Hypertension.    Type 2 diabetes.       Dementia.    Chronic kidney disease with a baseline creatinine of 1.5 was recently improved at 0.98.    Coronary artery disease.   Status post CABG.    Hyperlipidemia. -Now off statins secondary to elevated liver function tests   CURRENT MEDICATIONS:  Discharge medications include:       .       Depakote 250 mg q.12.    Hydrochlorothiazide 12.5 q.d.    Lantus 15 units daily at bedtime.    Lisinopril 40 mg QD.    Vitamin B12, 1000 U daily.    Vitamin D3, 1000 U daily.  Potassium 20 mEq a day  Aspirin 81 mg daily    Folic acid 1 mg daily  NovoLog insulin  7 units before meals meals if CBG greater than 200  SOCIAL HISTORY:                    HOUSING:  Patient previously lived at assisted living [facility]-    CODE STATUS:  He does come with a DNR status.    FAMILY HISTORY:   Not currently available.    REVIEW OF SYSTEMS:--Limited secondary to dementia Please see history of present illness to have no complaints of pain shortness of breath chest pain    In general no  complaints fever or chills.     Cardiac no chest  Pain-- mild left lower extremity edema-this has been chronic. Respiratory-no cough or shortness of breath noted.  GI-apparently eats well does not complaining of abdominal discomfort nausea or vomiting diarrhea or constipation.  Musculoskeletal-apparently joint pain has not been an issue --no apparent complaints of pain after recent falls--does not complain of increased leg pain does have a history again of a left leg DVT.  Neurologic again no syncopal episodes have been noted.  Psych he does have significant dementia but behaviors have been well-controlled he's been pleasant and cooperative-.   [name]    PHYSICAL EXAMINATION:   He is afebrile pulses 70 respirations 18 blood pressure 128/78-weight is 185 this appears stable with weight last month-appears down about 5 pounds over the  past week although again there is quite a bit of variability here although His he does not appear to be  putting on weight  GENERAL APPEARANCE:  The patient is not in any distress. He does speak but does not talk a lot  Probably on the severe side of dementia, but no evidence of delirium he is pleasant and follows simple verbal commands.--Eating in his chair which she does frequently  His skin is warm and dry.--No increased bruising or bleeding noted-    Eyes pupils appear reactive light sclerae   clear --visual acuity appears grossly intact CHEST/RESPIRATORY:  Clear air entry bilaterally-no -labored breathing.   CARDIOVASCULAR:  CARDIAC:   Heart sounds are normal with no carotid bruits l1-2/6 systolic murmur  Regular  rate and rhythm.-- Appears to have one plus right lower extremity edema 2+ left lower extremity edema positive pedal pulses although somewhat reduced secondary to the edema-this does not appear to be grossly changed from baseline--legs are cool to touch nonerythematous nontender   GASTROINTESTINAL:  ABDOMEN:   Soft, nontender.  No masses. Positive bowel sounds   . [name] Musculoskeletal-moves all extremities 4 ---   -I do not note any deformities or pain with range of motion of his upper and  lower extremities SENSATION/STRENGTH:  Strength is normal--he is ambulatory moves all extremities at baseline I did not note any deformities.    Neurologic speech is clear I do not see any lateralizing findings  Psych He is oriented to self will follow simple verbal commands  With prompting---continues to be pleasant and cooperative during his stay here       Labs  10/05/2015.  Depakote level XXXIV.  Sodium 137 potassium 4.1 BUN 19 creatinine 0.98.  Albumin 3.4 otherwise liver function tests within normal limits.  WBC 4.5 hemoglobin 12.5 platelets 144.  As noted above back in October BNP was 475  09/03/2015.  Depakote level XLVII.  Sodium 139 potassium 4.2 BUN 28  creatinine 1.31.  Albumin 3.2.  Otherwise liver function tests within normal limits.  WBC 4.7 hemoglobin 10.2 platelets 128.  Hemoglobin A1c 7.9  07/27/2015.  WBC 3.8 hemoglobin 9.6 platelets 162.  Sodium 143 potassium 4 BUN 29 creatinine 1.32.  Albumin 2.9-.  08/03/2015-BNP 475    06/27/2015.  Sodium 142 potassium 4 BUN 28 creatinine 1.3.  Albumin 3.0 otherwise liver function tests within normal limits.  CBC 4.5 hemoglobin 9.9 platelets 214.  Valproic acid level XXXIV     06/01/2015.  WBC 4.1 hemoglobin 11.4 platelets 127.  05/26/2015.  Sodium 141 potassium 4.3 BUN 31 creatinine 1.31.  AST 44-albumin 3.0 otherwise liver function tests within normal limits  04/08/2015.  Valproic acid 43.  Hemoglobin  A1c 7.8 which actually is improvement from 3 months ago when it was 9.2 and 6 months ago when it was 12.3.  03/16/2015.  WBC 5.9 hemoglobin 10.2 platelets 161.  03/23/2015.  Sodium 142 potassium 3.8 BUN 20 creatinine 1.17.  Alb-3.2-otherwise liver function tests within normal limits.    12/23/2014.  WBC 4.5 hemoglobin 10.9 platelets 162.  Sodium 138 potassium 4.3 BUN 28 creatinine 1.19.  Liver function tests within normal limits  Cholesterol 189 triglycerides 101 HDL 44 LDL 125.  Hemoglobin A1c 9.2 Depakote level LXVII.6  .  11/29/2014.  WBC 4.2 hemoglobin 11.7 platelets 182  Nov 26 2013.  WBC 4.3 hemoglobin 10.5 platelets 120,000.  11/25/2013.  WBC 4.2 hemoglobin 10.3 platelets 108.  Valproic acid level LXXVI.5.  Sodium 139 potassium 4.5 BUN 32 creatinine 1.4  11/01/2014.  Valproic acid level LXXXVIII.  10/28/2014.  WBC 4.4 hemoglobin 10.5 platelets 175.  Sodium 142 potassium 3.7 BUN 25 creatinine 1.18.  Magnesium level II.0.  Depakote level XCVI.5.        .    ASSESSMENT/PLAN:  History of increased lower extremity edema-his weights actually appear to be stable I do not see really a significant change  today from what he has presented previously-will reorder a BNP for tomorrow also have him weighed and notify provider of gain greater than 3 pounds-also will update a BMP-clinical exam does not appear grossly changed from his baseline-encourage leg elevation He is not complaining of any chest pain or increased shortness of breath-I suspect this could be dependent related     history of left leg DVT-  He has completed an extended courses Xarelto continues on aspirin--this has been stable                                                      Thrombocytopenia-platelets appear to have stabilized although again somewhat lower than normal range at times-Depakote has been titrated down-most recently 144,000 on lab done December 7 this appears relatively baseline  Renal insufficiency-creatinine recently 0.98 this actually appears to be improved from his baseline Will update this.  [id number]     .          Hypertension-  he is on lisinopril 40 mg a day hydrochlorothiazide 12.5 mg a day--appears to have somewhat elevated systolics although he does have a history of falls which would make me somewhat hesitant to be too aggressive here-at this point will monitor    Question seizure disorder-he is on Depakote- This is been titrated down there been no recent reports of seizures   Hyperlipidemia- He is no longer on a statin secondary to elevated liver function tests--- I would be hesitant to restart t a statin secondary to this-as well as with his comorbidities and relatively advanced age   Dementia with behaviors-this appears to be stable apparently this was an issue at assisted living but his stay here behavior wise has been quite unremarkable he is pleasant I do not really note any issues per nursing--he apparently does have a history of intermittent syncopal episodes which are fairly rare as noted above   .  Weight loss He has rebounded back ---most recent weight of 190 appears  to be again of about 5 pounds over the past month although they're variable weights before meals had been as high as 193 recently I  also see 187.8 I suspect there is some scale variation here   Anemia macrocytic-this appears to be  stable  he has been started on folic acid-will update CBC  History of fall with head laceration-he appears to be stable in this regards neurologically is not complaining of any headache or pain there are orders to remove staples at appropriate time   .     [id number]--of note greater than 40 minutes spent assessing patient-discussing his status with nursing staff  reviewing his chart including ER note and workup t-and coordinating and formulating a plan of care for numerous diagnoses-of note greater than 50% of time spent coordinating plan of care

## 2015-10-15 ENCOUNTER — Encounter (HOSPITAL_COMMUNITY)
Admission: RE | Admit: 2015-10-15 | Discharge: 2015-10-15 | Disposition: A | Discharge status: Home / Self Care | Payer: Medicare PPO | Source: Skilled Nursing Facility | Attending: Internal Medicine | Admitting: Internal Medicine

## 2015-10-15 DIAGNOSIS — N183 Chronic kidney disease, stage 3 (moderate): Secondary | ICD-10-CM | POA: Diagnosis not present

## 2015-10-15 LAB — BASIC METABOLIC PANEL
Anion gap: 4 — ABNORMAL LOW (ref 5–15)
BUN: 32 mg/dL — AB (ref 6–20)
CALCIUM: 8.6 mg/dL — AB (ref 8.9–10.3)
CO2: 29 mmol/L (ref 22–32)
CREATININE: 1.32 mg/dL — AB (ref 0.61–1.24)
Chloride: 106 mmol/L (ref 101–111)
GFR calc Af Amer: 57 mL/min — ABNORMAL LOW (ref >60)
GFR, EST NON AFRICAN AMERICAN: 49 mL/min — AB (ref >60)
GLUCOSE: 173 mg/dL — AB (ref 65–99)
Potassium: 4 mmol/L (ref 3.5–5.1)
Sodium: 139 mmol/L (ref 135–145)

## 2015-10-15 LAB — BRAIN NATRIURETIC PEPTIDE: B Natriuretic Peptide: 101 pg/mL — ABNORMAL HIGH (ref 0.0–100.0)

## 2015-10-20 ENCOUNTER — Encounter (HOSPITAL_COMMUNITY)
Admission: AD | Admit: 2015-10-20 | Discharge: 2015-10-20 | Disposition: A | Discharge status: Home / Self Care | Payer: Medicare PPO | Source: Skilled Nursing Facility | Attending: Internal Medicine | Admitting: Internal Medicine

## 2015-10-24 ENCOUNTER — Encounter (HOSPITAL_COMMUNITY)
Admission: RE | Admit: 2015-10-24 | Discharge: 2015-10-24 | Disposition: A | Discharge status: Home / Self Care | Payer: Medicare PPO | Source: Skilled Nursing Facility | Attending: Internal Medicine | Admitting: Internal Medicine

## 2015-10-24 DIAGNOSIS — N183 Chronic kidney disease, stage 3 (moderate): Secondary | ICD-10-CM | POA: Diagnosis not present

## 2015-10-24 LAB — BASIC METABOLIC PANEL
ANION GAP: 3 — AB (ref 5–15)
BUN: 28 mg/dL — ABNORMAL HIGH (ref 6–20)
CALCIUM: 8.6 mg/dL — AB (ref 8.9–10.3)
CO2: 31 mmol/L (ref 22–32)
CREATININE: 1.25 mg/dL — AB (ref 0.61–1.24)
Chloride: 106 mmol/L (ref 101–111)
GFR calc Af Amer: >60 mL/min (ref >60)
GFR, EST NON AFRICAN AMERICAN: 52 mL/min — AB (ref >60)
GLUCOSE: 171 mg/dL — AB (ref 65–99)
Potassium: 4.4 mmol/L (ref 3.5–5.1)
Sodium: 140 mmol/L (ref 135–145)

## 2015-11-20 ENCOUNTER — Non-Acute Institutional Stay (SKILLED_NURSING_FACILITY): Payer: Medicare Other | Admitting: Internal Medicine

## 2015-11-20 DIAGNOSIS — R55 Syncope and collapse: Secondary | ICD-10-CM | POA: Diagnosis not present

## 2015-11-21 ENCOUNTER — Emergency Department (HOSPITAL_COMMUNITY)
Admission: EM | Admit: 2015-11-21 | Discharge: 2015-11-21 | Disposition: A | Discharge status: Home / Self Care | Payer: Medicare Other | Attending: Emergency Medicine | Admitting: Emergency Medicine

## 2015-11-21 ENCOUNTER — Inpatient Hospital Stay
Admission: RE | Admit: 2015-11-21 | Discharge: 2016-06-28 | Disposition: A | Discharge status: Nursing Home (Includes ICF) | Payer: Medicare Other | Source: Ambulatory Visit | Attending: Internal Medicine | Admitting: Internal Medicine

## 2015-11-21 ENCOUNTER — Encounter (HOSPITAL_COMMUNITY): Payer: Self-pay | Admitting: Emergency Medicine

## 2015-11-21 ENCOUNTER — Encounter (HOSPITAL_COMMUNITY)
Admission: RE | Admit: 2015-11-21 | Discharge: 2015-11-21 | Disposition: A | Discharge status: Home / Self Care | Payer: Medicare Other | Source: Skilled Nursing Facility | Attending: Internal Medicine | Admitting: Internal Medicine

## 2015-11-21 DIAGNOSIS — N390 Urinary tract infection, site not specified: Secondary | ICD-10-CM

## 2015-11-21 DIAGNOSIS — Z794 Long term (current) use of insulin: Secondary | ICD-10-CM | POA: Insufficient documentation

## 2015-11-21 DIAGNOSIS — I129 Hypertensive chronic kidney disease with stage 1 through stage 4 chronic kidney disease, or unspecified chronic kidney disease: Secondary | ICD-10-CM | POA: Insufficient documentation

## 2015-11-21 DIAGNOSIS — N189 Chronic kidney disease, unspecified: Secondary | ICD-10-CM | POA: Diagnosis not present

## 2015-11-21 DIAGNOSIS — W19XXXA Unspecified fall, initial encounter: Secondary | ICD-10-CM

## 2015-11-21 DIAGNOSIS — R4182 Altered mental status, unspecified: Secondary | ICD-10-CM | POA: Insufficient documentation

## 2015-11-21 DIAGNOSIS — E7800 Pure hypercholesterolemia, unspecified: Secondary | ICD-10-CM | POA: Diagnosis not present

## 2015-11-21 DIAGNOSIS — F0390 Unspecified dementia without behavioral disturbance: Secondary | ICD-10-CM | POA: Diagnosis not present

## 2015-11-21 DIAGNOSIS — R531 Weakness: Secondary | ICD-10-CM

## 2015-11-21 DIAGNOSIS — Z7982 Long term (current) use of aspirin: Secondary | ICD-10-CM | POA: Diagnosis not present

## 2015-11-21 DIAGNOSIS — F0280 Dementia in other diseases classified elsewhere without behavioral disturbance: Secondary | ICD-10-CM | POA: Insufficient documentation

## 2015-11-21 DIAGNOSIS — E119 Type 2 diabetes mellitus without complications: Secondary | ICD-10-CM | POA: Insufficient documentation

## 2015-11-21 DIAGNOSIS — R600 Localized edema: Principal | ICD-10-CM

## 2015-11-21 DIAGNOSIS — Z87891 Personal history of nicotine dependence: Secondary | ICD-10-CM | POA: Diagnosis not present

## 2015-11-21 DIAGNOSIS — R279 Unspecified lack of coordination: Secondary | ICD-10-CM | POA: Diagnosis present

## 2015-11-21 DIAGNOSIS — Z951 Presence of aortocoronary bypass graft: Secondary | ICD-10-CM | POA: Diagnosis not present

## 2015-11-21 DIAGNOSIS — R55 Syncope and collapse: Secondary | ICD-10-CM | POA: Diagnosis present

## 2015-11-21 LAB — URINALYSIS, ROUTINE W REFLEX MICROSCOPIC
Bilirubin Urine: NEGATIVE
Glucose, UA: NEGATIVE mg/dL
Nitrite: POSITIVE — AB
Protein, ur: NEGATIVE mg/dL
Specific Gravity, Urine: 1.025 (ref 1.005–1.030)
pH: 5.5 (ref 5.0–8.0)

## 2015-11-21 LAB — TROPONIN I: Troponin I: 0.03 ng/mL (ref <0.031)

## 2015-11-21 LAB — CBC
HCT: 34.4 % — ABNORMAL LOW (ref 39.0–52.0)
Hemoglobin: 11.6 g/dL — ABNORMAL LOW (ref 13.0–17.0)
MCH: 31.5 pg (ref 26.0–34.0)
MCHC: 33.7 g/dL (ref 30.0–36.0)
MCV: 93.5 fL (ref 78.0–100.0)
Platelets: 135 10*3/uL — ABNORMAL LOW (ref 150–400)
RBC: 3.68 MIL/uL — ABNORMAL LOW (ref 4.22–5.81)
RDW: 14.3 % (ref 11.5–15.5)
WBC: 5.8 10*3/uL (ref 4.0–10.5)

## 2015-11-21 LAB — BASIC METABOLIC PANEL
ANION GAP: 7 (ref 5–15)
Anion gap: 11 (ref 5–15)
BUN: 29 mg/dL — AB (ref 6–20)
BUN: 28 mg/dL — ABNORMAL HIGH (ref 6–20)
CO2: 29 mmol/L (ref 22–32)
CO2: 26 mmol/L (ref 22–32)
CREATININE: 1.25 mg/dL — AB (ref 0.61–1.24)
Calcium: 9 mg/dL (ref 8.9–10.3)
Calcium: 9 mg/dL (ref 8.9–10.3)
Chloride: 106 mmol/L (ref 101–111)
Chloride: 106 mmol/L (ref 101–111)
Creatinine, Ser: 1.48 mg/dL — ABNORMAL HIGH (ref 0.61–1.24)
GFR calc Af Amer: 49 mL/min — ABNORMAL LOW (ref >60)
GFR calc non Af Amer: 43 mL/min — ABNORMAL LOW (ref >60)
GFR, EST NON AFRICAN AMERICAN: 52 mL/min — AB (ref >60)
GLUCOSE: 181 mg/dL — AB (ref 65–99)
Glucose, Bld: 153 mg/dL — ABNORMAL HIGH (ref 65–99)
Potassium: 3.7 mmol/L (ref 3.5–5.1)
Potassium: 3.9 mmol/L (ref 3.5–5.1)
SODIUM: 142 mmol/L (ref 135–145)
Sodium: 143 mmol/L (ref 135–145)

## 2015-11-21 LAB — CBG MONITORING, ED: Glucose-Capillary: 133 mg/dL — ABNORMAL HIGH (ref 65–99)

## 2015-11-21 LAB — URINE MICROSCOPIC-ADD ON

## 2015-11-21 LAB — CBC WITH DIFFERENTIAL/PLATELET
Basophils Absolute: 0 10*3/uL (ref 0.0–0.1)
Basophils Relative: 1 %
EOS ABS: 0.1 10*3/uL (ref 0.0–0.7)
EOS PCT: 2 %
HCT: 33.4 % — ABNORMAL LOW (ref 39.0–52.0)
Hemoglobin: 11.2 g/dL — ABNORMAL LOW (ref 13.0–17.0)
LYMPHS ABS: 1.6 10*3/uL (ref 0.7–4.0)
LYMPHS PCT: 36 %
MCH: 31.1 pg (ref 26.0–34.0)
MCHC: 33.5 g/dL (ref 30.0–36.0)
MCV: 92.8 fL (ref 78.0–100.0)
MONO ABS: 0.6 10*3/uL (ref 0.1–1.0)
Monocytes Relative: 13 %
Neutro Abs: 2.2 10*3/uL (ref 1.7–7.7)
Neutrophils Relative %: 48 %
PLATELETS: 185 10*3/uL (ref 150–400)
RBC: 3.6 MIL/uL — ABNORMAL LOW (ref 4.22–5.81)
RDW: 14.2 % (ref 11.5–15.5)
WBC: 4.5 10*3/uL (ref 4.0–10.5)

## 2015-11-21 LAB — VALPROIC ACID LEVEL
Valproic Acid Lvl: 34 ug/mL — ABNORMAL LOW (ref 50.0–100.0)
Valproic Acid Lvl: 33 ug/mL — ABNORMAL LOW (ref 50.0–100.0)

## 2015-11-21 MED ORDER — CEPHALEXIN 500 MG PO CAPS: 500.0000 mg | ORAL_CAPSULE | Freq: Three times a day (TID) | ORAL | Status: DC

## 2015-11-21 MED ORDER — DEXTROSE 5 % IV SOLN
1.0000 g | Freq: Once | INTRAVENOUS | Status: AC
  Administered 2015-11-21: 1 g via INTRAVENOUS
  Filled 2015-11-21: qty 10

## 2015-11-21 NOTE — ED Notes (Signed)
Called [facility] to inform pt is ready for discharge

## 2015-11-21 NOTE — ED Notes (Signed)
Patient arrives via [facility] from [facility] with c/o LOC. CNA found patient slumped over in chair. Placed pt in bed. States diaphoresis. Patient became conscious to sternal rub. Arrives to ED alert/oriented.Denies pain.

## 2015-11-21 NOTE — ED Provider Notes (Signed)
CSN: [id number]     Arrival date & time 11/21/15  1538 History   First MD Initiated Contact with Patient 11/21/15 1550     Chief Complaint  Patient presents with  . Loss of Consciousness     (Consider location/radiation/quality/duration/timing/severity/associated sxs/prior Treatment) HPI   82yM with possible syncope. Pt is pleasantly demented and cannot provide useful history.  Per report, found slumped forward in chair at facility. Unreposnsive. Able to place him in bed though and then woke up to sternal rub. On arrival he is awake and alert. Disoriented to time.  Denies any acute pain. Patient is unsure what happened earlier today.  Past Medical History  Diagnosis Date  . Elevated cholesterol   . Hypertension   . Type II diabetes mellitus (HCC)   . Dementia     "don't know stage or type" (08/18/2014)  . Arthritis     "probably"  . Chronic kidney disease     "? stage" (08/18/2014)   Past Surgical History  Procedure Laterality Date  . Tonsillectomy    . Coronary artery bypass graft  ?1994    "CABG X3"  . Cataract extraction     No family history on file. Social History  Substance Use Topics  . Smoking status: Former Smoker    Types: Pipe  . Smokeless tobacco: Never Used     Comment: "quit smoking a pipe in the 80's"  . Alcohol Use: No    Review of Systems  All systems reviewed and negative, other than as noted in HPI.   Allergies  Review of patient's allergies indicates no known allergies.  Home Medications   Prior to Admission medications   Medication Sig Start Date End Date Taking? Authorizing Provider  aspirin 81 MG tablet Take 81 mg by mouth daily.    Historical Provider, MD  atorvastatin (LIPITOR) 80 MG tablet Take 1 tablet (80 mg total) by mouth daily at 6 PM. Patient not taking: Reported on 10/16/2014 10/05/14   Stark Bray, MD  Cholecalciferol (VITAMIN D-3) 1000 UNITS CAPS Take 1,000 Units by mouth daily.    Historical Provider, MD  divalproex  (DEPAKOTE ER) 250 MG 24 hr tablet Take 250 mg by mouth 2 (two) times daily.    Historical Provider, MD  divalproex (DEPAKOTE) 500 MG DR tablet Take 1 tablet (500 mg total) by mouth every 12 (twelve) hours. Patient taking differently: Take 250 mg by mouth every 12 (twelve) hours.  10/18/14   Stark Bray, MD  folic acid (FOLVITE) 1 MG tablet Take 1 mg by mouth daily.    Historical Provider, MD  hydrochlorothiazide (MICROZIDE) 12.5 MG capsule Take 12.5 mg by mouth daily.    Historical Provider, MD  insulin aspart (NOVOLOG) 100 UNIT/ML injection Inject 7 Units into the skin 3 (three) times daily before meals.    Historical Provider, MD  insulin glargine (LANTUS) 100 UNIT/ML injection Inject 15 Units into the skin at bedtime.     Historical Provider, MD  lisinopril (PRINIVIL,ZESTRIL) 30 MG tablet Take 30 mg by mouth daily.    Historical Provider, MD  potassium chloride (KLOR-CON) 20 MEQ packet Take 20 mEq by mouth daily.     Historical Provider, MD   BP 180/74 mmHg  Pulse 74  Temp(Src) 98.1 F (36.7 C) (Oral)  Resp 18  Ht  (1.702 m)  Wt 190 lb (86.183 kg)  BMI 29.75 kg/m2  SpO2 95% Physical Exam  Constitutional: He appears well-developed and well-nourished. No distress.  Laying  in bed. No acute distress.  HENT:  Head: Normocephalic and atraumatic.  Eyes: Conjunctivae are normal. Right eye exhibits no discharge. Left eye exhibits no discharge.  Neck: Neck supple.  Cardiovascular: Normal rate, regular rhythm and normal heart sounds.  Exam reveals no gallop and no friction rub.   No murmur heard. Pulmonary/Chest: Effort normal and breath sounds normal. No respiratory distress.  Abdominal: Soft. He exhibits no distension. There is no tenderness.  Musculoskeletal: He exhibits no edema or tenderness.  Neurological: He is alert. No cranial nerve deficit. He exhibits normal muscle tone. Coordination normal.  Skin: Skin is warm and dry.  Psychiatric: He has a normal mood and affect. His  behavior is normal. Thought content normal.  Nursing note and vitals reviewed.   ED Course  Procedures (including critical care time) Labs Review Labs Reviewed  BASIC METABOLIC PANEL - Abnormal; Notable for the following:    Glucose, Bld 153 (*)    BUN 28 (*)    Creatinine, Ser 1.48 (*)    GFR calc non Af Amer 43 (*)    GFR calc Af Amer 49 (*)    All other components within normal limits  CBC - Abnormal; Notable for the following:    RBC 3.68 (*)    Hemoglobin 11.6 (*)    HCT 34.4 (*)    Platelets 135 (*)    All other components within normal limits  URINALYSIS, ROUTINE W REFLEX MICROSCOPIC (NOT AT [facility]) - Abnormal; Notable for the following:    APPearance HAZY (*)    Hgb urine dipstick TRACE (*)    Ketones, ur TRACE (*)    Nitrite POSITIVE (*)    Leukocytes, UA TRACE (*)    All other components within normal limits  VALPROIC ACID LEVEL - Abnormal; Notable for the following:    Valproic Acid Lvl 33 (*)    All other components within normal limits  URINE MICROSCOPIC-ADD ON - Abnormal; Notable for the following:    Squamous Epithelial / LPF 0-5 (*)    Bacteria, UA MANY (*)    Casts GRANULAR CAST (*)    All other components within normal limits  CBG MONITORING, ED - Abnormal; Notable for the following:    Glucose-Capillary 133 (*)    All other components within normal limits  URINE CULTURE  TROPONIN I    Imaging Review No results found. I have personally reviewed and evaluated these images and lab results as part of my medical decision-making.   EKG Interpretation None      MDM   Final diagnoses:  Altered mental status, unspecified altered mental status type  UTI (lower urinary tract infection)    82 year old male found poorly responsive this before arrival. No seizure activity reported. Reportedly quick return to baseline. Currently no complaints. He appears to be at his baseline. Workup significant for UTI. Feel he can be appropriately treated as an  outpatient.  Raeford Razor, MD 11/26/15 [phone]

## 2015-11-21 NOTE — Discharge Instructions (Signed)

## 2015-11-22 ENCOUNTER — Non-Acute Institutional Stay (SKILLED_NURSING_FACILITY): Payer: Medicare Other | Admitting: Internal Medicine

## 2015-11-22 ENCOUNTER — Encounter: Payer: Self-pay | Admitting: Internal Medicine

## 2015-11-22 DIAGNOSIS — R319 Hematuria, unspecified: Secondary | ICD-10-CM

## 2015-11-22 DIAGNOSIS — N189 Chronic kidney disease, unspecified: Secondary | ICD-10-CM

## 2015-11-22 DIAGNOSIS — N390 Urinary tract infection, site not specified: Secondary | ICD-10-CM | POA: Diagnosis not present

## 2015-11-22 DIAGNOSIS — R55 Syncope and collapse: Secondary | ICD-10-CM

## 2015-11-22 NOTE — Progress Notes (Signed)
Patient ID: Todd [name], male   DOB: 09-Sep-1932, 82 y.o.   MRN: [id number]            ACILITY: [facility]    LEVEL OF CARE:   SNF  This is an-acute-  visit CHIEF COMPLAINT:  Acute visit--follow-up ER visit for?? syncopal episode      HISTORY OF PRESENT ILLNESS:   Patient is a pleasant elderly male with weakness  here for rehabilitation and monitoring--he had been hospitalized for altered mental status lethargy which apparently improved during  hospitalization he was empirically started apparently on Depakote for concerns he may be having seizures. After his discharge here  . He does have a history of recurrent syncope although this is been stable for some time  Apparently he was found slumped over in his chair unresponsive-when he was put to bed and a sternal rub was applied apparently he became somewhat responsive-by the time he got to the ER apparently he was relatively back at his baseline.  Per ER assessment patient appeared to be stable-labs appeared to be relatively baseline he was sent back on Keflex for suspected UTI.        PAST MEDICAL HISTORY/PROBLEM LIST:                  Recurrent syncope.  Note that he had an admission from 08/18/2014 through 08/20/2014 for this issue, also 08/31/2014 through 09/01/2014 with altered mental status, 10/03/2014 through 10/05/2014 for ?TIA symptoms.  He has also presented with an unresponsive episode.    Hypertension.    Type 2 diabetes.       Dementia.    Chronic kidney disease with a baseline creatinine of 1.5 was recently improved at 0.98.    Coronary artery disease.   Status post CABG.    Hyperlipidemia. -Now off statins secondary to elevated liver function tests   CURRENT MEDICATIONS:  Discharge medications include:       .       Depakote 250 mg q.12.    Hydrochlorothiazide 12.5 q.d.    Lantus 15 units daily at bedtime.    Lisinopril 40 mg QD.    Vitamin B12, 1000 U daily.    Vitamin D3, 1000  U daily.  Potassium 20 mEq a day  Aspirin 81 mg daily    Folic acid 1 mg daily  NovoLog insulin  7 units before meals meals if CBG greater than 200  SOCIAL HISTORY:                    HOUSING:  Patient previously lived at assisted living [place]-    CODE STATUS:  He does come with a DNR status.    FAMILY HISTORY:   Not currently available.    REVIEW OF SYSTEMS:--Limited secondary to dementia Please see history of present illness to have no complaints of pain shortness of breath chest pain    In general no complaints fever or chills.     Cardiac no chest  Pain-- mild left lower extremity edema-this has been chronic. Respiratory-no cough or shortness of breath noted.  GI-apparently eats well does not complaining of abdominal discomfort nausea or vomiting diarrhea or constipation.  Musculoskeletal-apparently joint pain has not been an issue --no apparent complaints of pain after recent falls--does not complain of increased leg pain does have a history  of a left leg DVT.  Neurologic again no syncopal episodes have been noted.  Psych he does have significant dementia but behaviors have been  well-controlled he's been pleasant and cooperative-.   [name]    PHYSICAL EXAMINATION:    Temperature 97.6 pulse 76 respirations 20 blood pressure 139/67-O2 saturation 97% on room air  GENERAL APPEARANCE:  The patient is not in any distress. He does speak but does not talk a lot  Probably on the severe side of dementia, but no evidence of delirium he is pleasant and follows simple verbal commands.- His skin is warm and dry.--No increased bruising or bleeding noted-    Eyes pupils appear reactive light sclerae   clear --visual acuity appears grossly intact CHEST/RESPIRATORY:  Clear air entry bilaterally-no -labored breathing.   CARDIOVASCULAR:  CARDIAC:   Heart sounds are normal with no carotid bruits l1-2/6 systolic murmur  Regular  rate and rhythm.-- Has mild lower extremity edema  slightly more in the left versus the right although this actually appears improved from previous exams    GASTROINTESTINAL:  ABDOMEN:   Soft, nontender.  No masses. Positive bowel sounds   . [name] Musculoskeletal-moves all extremities 4 ---   -I do not note any deformities or pain with range of motion of his upper and  lower extremities SENSATION/STRENGTH:  Strength is normal--he is ambulatory moves all extremities at baseline I did not note any deformities.    Neurologic speech is clear I do not see any lateralizing findings  Psych He is oriented to self will follow simple verbal commands  With prompting---continues to be pleasant and cooperative        Labs  11/21/2015.  Sodium 143 potassium 3.9 BUN 28 creatinine 1.48.  Previous lab on the same date showed a creatinine of 1.25.  Depakote level XXXIV.  WBC 4.5 hemoglobin 11.2 platelets 185.  Subsequent labs showed hemoglobin 11.6 in the ER platelets of 135.    10/05/2015.  Depakote level XXXIV.  Sodium 137 potassium 4.1 BUN 19 creatinine 0.98.  Albumin 3.4 otherwise liver function tests within normal limits.  WBC 4.5 hemoglobin 12.5 platelets 144.  As noted above back in October BNP was 475  09/03/2015.  Depakote level XLVII.  Sodium 139 potassium 4.2 BUN 28 creatinine 1.31.  Albumin 3.2.  Otherwise liver function tests within normal limits.  WBC 4.7 hemoglobin 10.2 platelets 128.  Hemoglobin A1c 7.9  07/27/2015.  WBC 3.8 hemoglobin 9.6 platelets 162.  Sodium 143 potassium 4 BUN 29 creatinine 1.32.  Albumin 2.9-.  08/03/2015-BNP 475              .    ASSESSMENT/PLAN:  H history of recurrent syncope-patient has not really had one in a while although appears he did have one prompting the ER visit-at this point appears to be back at his baseline vital signs 7 stable labs have been relatively baseline at this point continue to monitor.  A urine culture was obtained he has been put on  Keflex empirically will have to follow up on the urine culture.  r     history of left leg DVT-  He has completed an extended courses Xarelto continues on aspirin--this has been stable                                                      Thrombocytopenia-platelets appear to have stabilized although again somewhat lower than normal range at times-Depakote has been titrated down- Renal insufficiency-creatinine recently 1.25--1.48  this actually appears to be about his baseline--labs done yesterday actually showed variability we will update this \\CPT -[id number]     .

## 2015-11-23 ENCOUNTER — Encounter (HOSPITAL_COMMUNITY)
Admission: AD | Admit: 2015-11-23 | Discharge: 2015-11-23 | Disposition: A | Discharge status: Home / Self Care | Payer: Medicare Other | Source: Skilled Nursing Facility | Attending: Internal Medicine | Admitting: Internal Medicine

## 2015-11-23 DIAGNOSIS — E119 Type 2 diabetes mellitus without complications: Secondary | ICD-10-CM | POA: Diagnosis not present

## 2015-11-23 LAB — BASIC METABOLIC PANEL
Anion gap: 5 (ref 5–15)
BUN: 27 mg/dL — ABNORMAL HIGH (ref 6–20)
CHLORIDE: 106 mmol/L (ref 101–111)
CO2: 30 mmol/L (ref 22–32)
Calcium: 8.7 mg/dL — ABNORMAL LOW (ref 8.9–10.3)
Creatinine, Ser: 1.17 mg/dL (ref 0.61–1.24)
GFR calc non Af Amer: 56 mL/min — ABNORMAL LOW (ref >60)
Glucose, Bld: 149 mg/dL — ABNORMAL HIGH (ref 65–99)
Potassium: 3.9 mmol/L (ref 3.5–5.1)
Sodium: 141 mmol/L (ref 135–145)

## 2015-11-24 LAB — URINE CULTURE: Culture: >=100000

## 2015-11-25 ENCOUNTER — Telehealth (HOSPITAL_BASED_OUTPATIENT_CLINIC_OR_DEPARTMENT_OTHER): Payer: Self-pay | Admitting: Emergency Medicine

## 2015-11-25 NOTE — Telephone Encounter (Signed)
Post ED Visit - Positive Culture Follow-up  Culture report reviewed by antimicrobial stewardship pharmacist:   Enzo Bi, Pharm.D.  Celedonio Miyamoto, Pharm.D., BCPS  Garvin Fila, Pharm.D.  Georgina Pillion, Pharm.D., BCPS  [place], [place].D., BCPS, AAHIVP  Estella Husk, Pharm.D., BCPS, AAHIVP  Tennis Must, Pharm.D.  Rob Oswaldo Done, [address].D.  Positive urine culture Treated with cephalexin, organism sensitive to the same and no further patient follow-up is required at this time.  Berle Mull 11/25/2015, 9:49 AM

## 2015-11-25 NOTE — Progress Notes (Addendum)
Patient ID: Todd [name], male   DOB: December 12, 1931, 82 y.o.   MRN: [id number]                PROGRESS NOTE  DATE:  11/20/2015              FACILITY: [facility]             LEVEL OF CARE:   SNF   Acute Visit             CHIEF COMPLAINT:  Syncopal spell.      HISTORY OF PRESENT ILLNESS:  This is a patient who was found slumped over in a chair in his room by visitors.  He was put back in bed, came to, had a spell of confusion but seemed to be rapidly coming around.  His vital signs including his pulse, blood pressure, and blood sugar were all within the normal range.  He has done this before and has been admitted to hospital for this.  In December 2015, he had had a full cardiac work-up for this problem.  He is said to be orthostatic, although I do not think that has ever been checked properly.  Previous work-ups including EKGs, TEE, MRI, carotid dopplers, and telemetry all have been noncontributory.  He was put on Depakote empirically in case these were seizures.    PAST MEDICAL HISTORY/PROBLEM LIST:  Reviewed.    Recurrent syncope, as noted.      CURRENT MEDICATIONS:  Medication list is reviewed.          Vitamin D3, 1000 a day.    Hydrochlorothiazide 12.5 q.d.      Lisinopril 40 q.d.     Folic acid 1 q.d.       Divalproex extended release 250 b.i.d.       Enteric-coated aspirin 81 q.d.       Lantus 15 U at bedtime.    NovoLog 7 U a.c. meals if blood sugar is greater than 200.    REVIEW OF SYSTEMS:   Not really possible secondary to dementia and his presumably postictal state.    PHYSICAL EXAMINATION:   VITAL SIGNS:     PULSE:  68, and strong.    BLOOD PRESSURE:   132/82.    BLOOD SUGAR:   CBG 209.   02 SATURATIONS:  94%.   GENERAL APPEARANCE:   The patient is not in any distress.            CHEST/RESPIRATORY:  Clear air entry bilaterally.    CARDIOVASCULAR:   CARDIAC:  Heart sounds are normal.  There are no murmurs.    GASTROINTESTINAL:   ABDOMEN:  No  masses.    LIVER/SPLEEN/KIDNEYS:  No liver, no spleen.  No tenderness.    CIRCULATION:   EDEMA/VARICOSITIES:  Extremities:  Minimal edema.  No evidence of a DVT.   NEUROLOGICAL:    SENSATION/STRENGTH:  He is able to move both his arms and both his legs.   PSYCHIATRIC:   MENTAL STATUS:  I think this is probably coming around to his baseline.    ASSESSMENT/PLAN:                Syncope, ?seizure disorder.  This is not a new problem.  Previous work-up for this has been negative.  At some point, he was put on empiric antiepileptics based on his EEG, but I do not think that seizures were actually proven.  Vasovagal issues were felt to be possible, but difficult to  prove.  I am going to get a valproic acid level on him tomorrow, along with electrolytes and CBC.  Depending on his valproic acid level, the dose may need to be increased here.  There was no evidence of hypo- or hyperglycemia, or an unstable coronary situation.

## 2016-01-03 ENCOUNTER — Non-Acute Institutional Stay (SKILLED_NURSING_FACILITY): Payer: Medicare Other | Admitting: Internal Medicine

## 2016-01-03 DIAGNOSIS — F0391 Unspecified dementia with behavioral disturbance: Secondary | ICD-10-CM | POA: Diagnosis not present

## 2016-01-03 DIAGNOSIS — N189 Chronic kidney disease, unspecified: Secondary | ICD-10-CM

## 2016-01-03 DIAGNOSIS — E118 Type 2 diabetes mellitus with unspecified complications: Secondary | ICD-10-CM | POA: Diagnosis not present

## 2016-01-03 DIAGNOSIS — R609 Edema, unspecified: Secondary | ICD-10-CM | POA: Diagnosis not present

## 2016-01-03 DIAGNOSIS — I10 Essential (primary) hypertension: Secondary | ICD-10-CM

## 2016-01-03 DIAGNOSIS — I82409 Acute embolism and thrombosis of unspecified deep veins of unspecified lower extremity: Secondary | ICD-10-CM

## 2016-01-03 NOTE — Progress Notes (Signed)
Patient ID: Todd FendtMilton [name], male   DOB: 1931-11-01, 82 y.o.   MRN: [id number]            ACILITY: [facility]    LEVEL OF CARE:   SNF  This is a routine-acute-  visit   CHIEF COMPLAINT:  Acute visit follow-up lower extremity edema--- on left-medical management of chronic medical issues including diabetes type 2-dementia-hypertension-anemia-renal insufficiency--history of syncope     HISTORY OF PRESENT ILLNESS:   Patient is a pleasant elderly male with weakness  here for rehabilitation and monitoring--he had been hospitalized for altered mental status lethargy which apparently improved during  hospitalization he was empirically started apparently on Depakote for concerns he may be having seizures. After his discharge here He was found  to have increased edema of his left lower leg and  Doppler ultrasound was positive for DVT nonocclusive thrombus  on the proximal and distal left femoral vein and within the left popliteal vein he has completed a six-month course of Xarelto is now on aspirin.  Nursing staff has left a note about possibly some increased lower extremity edema- On the left  I do note he did have a cardiac echo done in November 2015 which showed a vigorous ejection fraction of 55-60 percent with grade 1 diastolic dysfunction.  His BNP was 475 on lab done on 08/03/2015.  He is on hydrochlorothiazide low dose this was reduced secondary to history of some renal insufficiency although most recent creatinine 1.17 BUN of 27 appears to be quite stable and improved-this lab was done back in late January  Currently has no complaints of shortness of breath or chest pain he is a poor historian secondary to dementia he appears essentially at his basel  He was sent to the ER several weeks ago for what appears to be a syncopal episode were he becomes unresponsive for short period-workup was negative that is usually the case-this is thought to be of unknown etiology possibly  seizures he is on Depakote.  On most recent ER visit he was treated empirically for UTI on Keflex he does not complain of dysuria at this time.  His other medical issues appear to be stable he does have a history of diabetes is on Lantus 15 units daily as well as NovoLog 7 units at meals-I do note occasionally morning has low blood sugars of 61 53--66 other times it is more in the low to mid 100s.  Later in the day there is more variability which sugars running mainly from the mid 100s to low 200s-actually has readings above 250 at times at 4 PM.  Also HS sugars appear to be running more in the 200's range.  However appears overnight he does go down fairly significantly.      PAST MEDICAL HISTORY/PROBLEM LIST:                  Recurrent syncope.  Note that he had an admission from 08/18/2014 through 08/20/2014 for this issue, also 08/31/2014 through 09/01/2014 with altered mental status, 10/03/2014 through 10/05/2014 for ?TIA symptoms.  He has also presented with an unresponsive episode.    Hypertension.    Type 2 diabetes.       Dementia.    Chronic kidney disease with a baseline creatinine of 1.5 was recently improved at 0.98.    Coronary artery disease.   Status post CABG.    Hyperlipidemia. -Now off statins secondary to elevated liver function tests   CURRENT MEDICATIONS:  Discharge medications  include:       .       Depakote 250 mg q.12.    Hydrochlorothiazide 12.5 q.d.    Lantus 15 units daily at bedtime.    Lisinopril 40 mg QD.    Vitamin B12, 1000 U daily.    Vitamin D3, 1000 U daily.  Potassium 20 mEq a day  Aspirin 81 mg daily    Folic acid 1 mg daily  NovoLog insulin  7 units before meals meals if CBG greater than 200  SOCIAL HISTORY:                    HOUSING:  Patient previously lived at assisted living [facility]-    CODE STATUS:  He does come with a DNR status.    FAMILY HISTORY:   Not currently available.    REVIEW OF SYSTEMS:--Limited  secondary to dementia Please see history of present illness to have no complaints of pain shortness of breath chest pain    In general no complaints fever or chills.     Cardiac no chest  Pain-- mild left lower extremity edema-this has increased somewhat per nursing. Respiratory-no cough or shortness of breath noted.  GI-apparently eats well does not complaining of abdominal discomfort nausea or vomiting diarrhea or constipation.  Musculoskeletal-apparently joint pain has not been an issue --no apparent complaints of pain after recent falls--does not complain of increased leg pain does have a history again of a left leg DVT.  Neurologic again no syncopal episodes have been noted.  Psych he does have significant dementia but behaviors have been well-controlled he's been pleasant and cooperative-.   [name]    PHYSICAL EXAMINATION:   Temperature 97.8 pulse 61 respirations 20 blood pressure 116/65 O2 saturation 97% on room air weight is 192.8 there is some appears to have some variability appears possibly a month ago was 188.2. Baseline runs 185-190 generally   GENERAL APPEARANCE:  The patient is not in any distress. He does speak but does not talk a lot  Probably on the severe side of dementia, but no evidence of delirium he is pleasant and follows simple verbal commands.-  His skin is warm and dry.--No increased bruising or bleeding noted-    Eyes pupils appear reactive light sclerae   clear --visual acuity appears grossly intact CHEST/RESPIRATORY:  Clear air entry bilaterally-no -labored breathing.   CARDIOVASCULAR:  CARDIAC:   Heart sounds are normal with no carotid bruits l1-2/6 systolic murmur  Regular  rate and rhythm.-- Appears to have one plus right lower extremity edema 2+ left lower extremity edema positive pedal pulses although somewhat reduced secondary to the edema---per staff this has increased somewhat from baseline recently--legs are cool to touch nonerythematous  nontender   GASTROINTESTINAL:  ABDOMEN:   Soft, nontender.  No masses. Positive bowel sounds   . [name] Musculoskeletal-moves all extremities 4 ---   -I do not note any deformities or pain with range of motion of his upper and  lower extremities SENSATION/STRENGTH:  Strength is normal--he is ambulatory moves all extremities at baseline I did not note any deformities.    Neurologic speech is clear I do not see any lateralizing findings  Psych He is oriented to self will follow simple verbal commands  With prompting---continues to be pleasant and cooperative        Labs  11/23/2015.  Sodium 141 potassium 3.9 BUN 27 creatinine 1.17 CO2 30.  11/21/2015.  Valproic acid 33.  WBC 5.8 hemoglobin 11.6 platelets  135.    10/05/2015.  Depakote level XXXIV.  Sodium 137 potassium 4.1 BUN 19 creatinine 0.98.  Albumin 3.4 otherwise liver function tests within normal limits.  WBC 4.5 hemoglobin 12.5 platelets 144.  As noted above back in October BNP was 475  09/03/2015.  Depakote level XLVII.  Sodium 139 potassium 4.2 BUN 28 creatinine 1.31.  Albumin 3.2.  Otherwise liver function tests within normal limits.  WBC 4.7 hemoglobin 10.2 platelets 128.  Hemoglobin A1c 7.9  07/27/2015.  WBC 3.8 hemoglobin 9.6 platelets 162.  Sodium 143 potassium 4 BUN 29 creatinine 1.32.  Albumin 2.9-.  08/03/2015-BNP 475    06/27/2015.  Sodium 142 potassium 4 BUN 28 creatinine 1.3.  Albumin 3.0 otherwise liver function tests within normal limits.  CBC 4.5 hemoglobin 9.9 platelets 214.  Valproic acid level XXXIV     06/01/2015.  WBC 4.1 hemoglobin 11.4 platelets 127.  05/26/2015.  Sodium 141 potassium 4.3 BUN 31 creatinine 1.31.  AST 44-albumin 3.0 otherwise liver function tests within normal limits  04/08/2015.  Valproic acid 43.  Hemoglobin A1c 7.8 which actually is improvement from 3 months ago when it was 9.2 and 6 months ago when it was  12.3.  03/16/2015.  WBC 5.9 hemoglobin 10.2 platelets 161.  03/23/2015.  Sodium 142 potassium 3.8 BUN 20 creatinine 1.17.  Alb-3.2-otherwise liver function tests within normal limits.    12/23/2014.  WBC 4.5 hemoglobin 10.9 platelets 162.  Sodium 138 potassium 4.3 BUN 28 creatinine 1.19.  Liver function tests within normal limits  Cholesterol 189 triglycerides 101 HDL 44 LDL 125.  Hemoglobin A1c 9.2 Depakote level LXVII.6  .           [name]    ASSESSMENT/PLAN:  History of increased lower extremity edema- This appears possibly more unilateral-this could be dependent related   -but will check a venous Doppler again he does have a history of DVT here currently on aspirin.  DVT did appear to be resolved on a Doppler done last May per chart review.  Also will check a BNP    history of left leg DVT-  He has completed an extended courses Xarelto continues on aspirin--this has been stable again Doppler showed resolution back in May but will update a Doppler secondary to some increased edema here                                                     Thrombocytopenia-platelets appear to have stabilized although again somewhat lower than normal range at times-Depakote has been titrated down-most recently 135,000 on lab done in late Gen. a will update this Renal insufficiency-creatinine recently 0.98 this actually appears to be improved from his baseline Will update this.      Hypertension-  he is on lisinopril 40 mg a day hydrochlorothiazide 12.5 mg a day- This appears stable recent blood pressures 116/65-130/70-125/65    Question seizure disorder-he is on Depakote- This is been titrated down there been no recent reports of seizures will update a Depakote level as well as liver function tests   Hyperlipidemia- He is no longer on a statin secondary to elevated liver function tests--- I would be hesitant to restart t a statin secondary to this-as well as with his  comorbidities and relatively advanced age   Dementia with behaviors-this appears to be stable apparently this was an  issue at assisted living but his stay here behavior wise has been quite unremarkable he is pleasant I do not really note any issues per nursing--he apparently does have a history of intermittent syncopal episodes which are fairly rare as noted above   .  Weight loss He has rebounded back -- Most recent weight 192.8 appears to be slightly above recent baseline although apparently a one time he had lost weight and now is regaining this   Anemia macrocytic-this appears to be  stable  he has been started on folic acid-will update CBC  Diabetes type 2-has noted some concern with lower blood sugars in the morning will decrease his Lantus to 12 units daily continue Humalog 7 units 3 times a day with meals-does have somewhat elevated readings later in the day but overnight appears to go down fairly significantly-will update a hemoglobin A1c  History of chronic kidney disease-creatinine 1.17 shows stability to some improvement from baseline-we will update this     .     [id number]--of note greater than 40 minutes spent assessing patient- Reviewing his chart-discussing his status with nursing staff-and coordinating and formulating a plan of care for numerous diagnoses-of note greater than 50% of time spent coordinating plan of care

## 2016-01-04 ENCOUNTER — Encounter (HOSPITAL_COMMUNITY)
Admission: RE | Admit: 2016-01-04 | Discharge: 2016-01-04 | Disposition: A | Discharge status: Home / Self Care | Payer: Medicare Other | Source: Skilled Nursing Facility | Attending: Internal Medicine | Admitting: Internal Medicine

## 2016-01-04 ENCOUNTER — Ambulatory Visit (HOSPITAL_COMMUNITY)
Admission: RE | Admit: 2016-01-04 | Discharge: 2016-01-04 | Disposition: A | Discharge status: Home / Self Care | Payer: Medicare Other | Source: Ambulatory Visit | Attending: Internal Medicine | Admitting: Internal Medicine

## 2016-01-04 DIAGNOSIS — N183 Chronic kidney disease, stage 3 (moderate): Secondary | ICD-10-CM | POA: Insufficient documentation

## 2016-01-04 DIAGNOSIS — Z9181 History of falling: Secondary | ICD-10-CM | POA: Diagnosis present

## 2016-01-04 DIAGNOSIS — E119 Type 2 diabetes mellitus without complications: Secondary | ICD-10-CM | POA: Diagnosis not present

## 2016-01-04 DIAGNOSIS — F0280 Dementia in other diseases classified elsewhere without behavioral disturbance: Secondary | ICD-10-CM | POA: Diagnosis present

## 2016-01-04 DIAGNOSIS — R262 Difficulty in walking, not elsewhere classified: Secondary | ICD-10-CM | POA: Diagnosis present

## 2016-01-04 DIAGNOSIS — I10 Essential (primary) hypertension: Secondary | ICD-10-CM | POA: Diagnosis present

## 2016-01-04 DIAGNOSIS — R600 Localized edema: Secondary | ICD-10-CM | POA: Diagnosis not present

## 2016-01-04 LAB — CBC WITH DIFFERENTIAL/PLATELET
Basophils Absolute: 0 10*3/uL (ref 0.0–0.1)
Basophils Relative: 1 %
EOS PCT: 4 %
Eosinophils Absolute: 0.2 10*3/uL (ref 0.0–0.7)
HCT: 36 % — ABNORMAL LOW (ref 39.0–52.0)
HEMOGLOBIN: 11.9 g/dL — AB (ref 13.0–17.0)
LYMPHS ABS: 1.9 10*3/uL (ref 0.7–4.0)
LYMPHS PCT: 46 %
MCH: 31.2 pg (ref 26.0–34.0)
MCHC: 33.1 g/dL (ref 30.0–36.0)
MCV: 94.5 fL (ref 78.0–100.0)
Monocytes Absolute: 0.4 10*3/uL (ref 0.1–1.0)
Monocytes Relative: 9 %
Neutro Abs: 1.6 10*3/uL — ABNORMAL LOW (ref 1.7–7.7)
Neutrophils Relative %: 40 %
PLATELETS: 169 10*3/uL (ref 150–400)
RBC: 3.81 MIL/uL — AB (ref 4.22–5.81)
RDW: 13.5 % (ref 11.5–15.5)
WBC: 4 10*3/uL (ref 4.0–10.5)

## 2016-01-04 LAB — COMPREHENSIVE METABOLIC PANEL
ALK PHOS: 42 U/L (ref 38–126)
ALT: 19 U/L (ref 17–63)
AST: 19 U/L (ref 15–41)
Albumin: 3.4 g/dL — ABNORMAL LOW (ref 3.5–5.0)
Anion gap: 4 — ABNORMAL LOW (ref 5–15)
BUN: 38 mg/dL — ABNORMAL HIGH (ref 6–20)
CALCIUM: 8.9 mg/dL (ref 8.9–10.3)
CO2: 33 mmol/L — ABNORMAL HIGH (ref 22–32)
Chloride: 105 mmol/L (ref 101–111)
Creatinine, Ser: 1.46 mg/dL — ABNORMAL HIGH (ref 0.61–1.24)
GFR, EST AFRICAN AMERICAN: 50 mL/min — AB (ref >60)
GFR, EST NON AFRICAN AMERICAN: 43 mL/min — AB (ref >60)
Glucose, Bld: 113 mg/dL — ABNORMAL HIGH (ref 65–99)
Potassium: 3.8 mmol/L (ref 3.5–5.1)
Sodium: 142 mmol/L (ref 135–145)
TOTAL PROTEIN: 6.5 g/dL (ref 6.5–8.1)
Total Bilirubin: 0.6 mg/dL (ref 0.3–1.2)

## 2016-01-04 LAB — BRAIN NATRIURETIC PEPTIDE: B NATRIURETIC PEPTIDE 5: 63 pg/mL (ref 0.0–100.0)

## 2016-01-04 LAB — VALPROIC ACID LEVEL: Valproic Acid Lvl: 35 ug/mL — ABNORMAL LOW (ref 50.0–100.0)

## 2016-01-05 LAB — HEMOGLOBIN A1C
HEMOGLOBIN A1C: 8.8 % — AB (ref 4.8–5.6)
Mean Plasma Glucose: 206 mg/dL

## 2016-01-08 ENCOUNTER — Encounter: Payer: Self-pay | Admitting: Internal Medicine

## 2016-01-11 ENCOUNTER — Encounter (HOSPITAL_COMMUNITY)
Admission: RE | Admit: 2016-01-11 | Discharge: 2016-01-11 | Disposition: A | Discharge status: Home / Self Care | Payer: Medicare Other | Source: Skilled Nursing Facility | Attending: Internal Medicine | Admitting: Internal Medicine

## 2016-01-11 DIAGNOSIS — E119 Type 2 diabetes mellitus without complications: Secondary | ICD-10-CM | POA: Diagnosis not present

## 2016-01-11 LAB — BASIC METABOLIC PANEL
Anion gap: 7 (ref 5–15)
BUN: 25 mg/dL — AB (ref 6–20)
CHLORIDE: 105 mmol/L (ref 101–111)
CO2: 30 mmol/L (ref 22–32)
CREATININE: 1.22 mg/dL (ref 0.61–1.24)
Calcium: 8.8 mg/dL — ABNORMAL LOW (ref 8.9–10.3)
GFR calc Af Amer: >60 mL/min (ref >60)
GFR calc non Af Amer: 53 mL/min — ABNORMAL LOW (ref >60)
GLUCOSE: 143 mg/dL — AB (ref 65–99)
POTASSIUM: 3.8 mmol/L (ref 3.5–5.1)
Sodium: 142 mmol/L (ref 135–145)

## 2016-01-12 ENCOUNTER — Encounter (HOSPITAL_COMMUNITY)
Admission: RE | Admit: 2016-01-12 | Discharge: 2016-01-12 | Disposition: A | Discharge status: Home / Self Care | Payer: Medicare Other | Source: Skilled Nursing Facility | Attending: Internal Medicine | Admitting: Internal Medicine

## 2016-01-12 DIAGNOSIS — E119 Type 2 diabetes mellitus without complications: Secondary | ICD-10-CM | POA: Diagnosis not present

## 2016-01-13 LAB — VITAMIN D 25 HYDROXY (VIT D DEFICIENCY, FRACTURES): VIT D 25 HYDROXY: 30.6 ng/mL (ref 30.0–100.0)

## 2016-02-02 ENCOUNTER — Encounter: Payer: Self-pay | Admitting: Internal Medicine

## 2016-02-02 ENCOUNTER — Non-Acute Institutional Stay (SKILLED_NURSING_FACILITY): Payer: Medicare Other | Admitting: Internal Medicine

## 2016-02-02 DIAGNOSIS — E1165 Type 2 diabetes mellitus with hyperglycemia: Secondary | ICD-10-CM

## 2016-02-02 DIAGNOSIS — Z794 Long term (current) use of insulin: Secondary | ICD-10-CM

## 2016-02-02 DIAGNOSIS — E118 Type 2 diabetes mellitus with unspecified complications: Secondary | ICD-10-CM | POA: Diagnosis not present

## 2016-02-02 DIAGNOSIS — IMO0002 Reserved for concepts with insufficient information to code with codable children: Secondary | ICD-10-CM

## 2016-02-02 NOTE — Progress Notes (Signed)
   This is a nursing facility follow up for specific acute issue   HPI: Pharmacy has noted afternoon and evening glucoses to range from 183-311 at 4:30 and 191-378 at 8 PM. He has been on NovoLog 7 units with his meals if the glucose is over 200. His Lantus dose had been decreased from 15 units because of some morning low glucoses.  Last  A1c on 01/04/16 was 8.8%.  ROS: Cannot be reliably completed due to his dementia. He is unable to identify his present location or the date. He did describe some occasional numbness in his hands.    Pertinent or positive findings: He is pleasantly confused. He is wearing an ankle bracelet as he is reported to wander. Slight ptosis is present on the right. A few remaining teeth in fair-poor repair. Grade 1/2 systolic murmur is noted at the base. Fingernails are thick and dark. He has 1+ pitting edema. Pedal pulses are decreased. General appearance:Adequately nourished; no acute distress or increased work of breathing is present.   Lymphatic: No  lymphadenopathy about the head, neck, or axilla . Eyes: No conjunctival inflammation or lid edema is present. There is no scleral icterus. Ears:  External ear exam shows no significant lesions or deformities.   Nose:  External nasal examination shows no deformity or inflammation. Nasal mucosa are pink and moist without lesions or exudates No septal dislocation or deviation.No obstruction to airflow.  Oral exam: lips and gums are healthy appearing.There is no oropharyngeal erythema or exudate . Neck:  No deformities, thyromegaly, masses, or tenderness noted.   Heart:  Normal rate and regular rhythm. S1 and S2 normal without gallop, click, rub or other extra sounds.  Lungs:Chest clear to auscultation; no wheezes, rhonchi,rales ,or rubs present. Abdomen:Bowel sounds are normal. Abdomen is soft and nontender with no organomegaly, hernias  or masses. Extremities:  No cyanosis or clubbing noted. Skin: Warm & dry w/o tenting  or jaundice. No significant lesions or rash.  See summary  in the Problem List with associated updated therapeutic plan

## 2016-02-02 NOTE — Assessment & Plan Note (Signed)
This will be maintained at 12 units as he has had some reported lows in the mornings   NovoLog to 10 units before meals as pc glucoses are uncontrolled

## 2016-02-03 ENCOUNTER — Encounter (HOSPITAL_COMMUNITY)
Admission: RE | Admit: 2016-02-03 | Discharge: 2016-02-03 | Disposition: A | Discharge status: Home / Self Care | Payer: Medicare Other | Source: Skilled Nursing Facility | Attending: Internal Medicine | Admitting: Internal Medicine

## 2016-02-03 DIAGNOSIS — N183 Chronic kidney disease, stage 3 (moderate): Secondary | ICD-10-CM | POA: Diagnosis not present

## 2016-02-03 DIAGNOSIS — G40309 Generalized idiopathic epilepsy and epileptic syndromes, not intractable, without status epilepticus: Secondary | ICD-10-CM | POA: Insufficient documentation

## 2016-02-03 DIAGNOSIS — E119 Type 2 diabetes mellitus without complications: Secondary | ICD-10-CM | POA: Insufficient documentation

## 2016-02-03 DIAGNOSIS — I10 Essential (primary) hypertension: Secondary | ICD-10-CM | POA: Diagnosis not present

## 2016-02-03 DIAGNOSIS — R279 Unspecified lack of coordination: Secondary | ICD-10-CM | POA: Diagnosis present

## 2016-02-03 DIAGNOSIS — F0280 Dementia in other diseases classified elsewhere without behavioral disturbance: Secondary | ICD-10-CM | POA: Diagnosis present

## 2016-02-03 LAB — COMPREHENSIVE METABOLIC PANEL
ALBUMIN: 3.3 g/dL — AB (ref 3.5–5.0)
ALK PHOS: 41 U/L (ref 38–126)
ALT: 20 U/L (ref 17–63)
AST: 22 U/L (ref 15–41)
Anion gap: 8 (ref 5–15)
BUN: 30 mg/dL — ABNORMAL HIGH (ref 6–20)
CALCIUM: 8.5 mg/dL — AB (ref 8.9–10.3)
CO2: 28 mmol/L (ref 22–32)
CREATININE: 1.4 mg/dL — AB (ref 0.61–1.24)
Chloride: 104 mmol/L (ref 101–111)
GFR calc Af Amer: 52 mL/min — ABNORMAL LOW (ref >60)
GFR calc non Af Amer: 45 mL/min — ABNORMAL LOW (ref >60)
GLUCOSE: 226 mg/dL — AB (ref 65–99)
Potassium: 4.7 mmol/L (ref 3.5–5.1)
SODIUM: 140 mmol/L (ref 135–145)
Total Bilirubin: 0.4 mg/dL (ref 0.3–1.2)
Total Protein: 6.3 g/dL — ABNORMAL LOW (ref 6.5–8.1)

## 2016-02-03 LAB — CBC WITH DIFFERENTIAL/PLATELET
Basophils Absolute: 0 10*3/uL (ref 0.0–0.1)
Basophils Relative: 1 %
EOS ABS: 0.3 10*3/uL (ref 0.0–0.7)
EOS PCT: 5 %
HCT: 35.8 % — ABNORMAL LOW (ref 39.0–52.0)
Hemoglobin: 12 g/dL — ABNORMAL LOW (ref 13.0–17.0)
LYMPHS ABS: 1.8 10*3/uL (ref 0.7–4.0)
Lymphocytes Relative: 39 %
MCH: 31.6 pg (ref 26.0–34.0)
MCHC: 33.5 g/dL (ref 30.0–36.0)
MCV: 94.2 fL (ref 78.0–100.0)
MONO ABS: 0.5 10*3/uL (ref 0.1–1.0)
Monocytes Relative: 11 %
Neutro Abs: 2 10*3/uL (ref 1.7–7.7)
Neutrophils Relative %: 44 %
PLATELETS: 173 10*3/uL (ref 150–400)
RBC: 3.8 MIL/uL — AB (ref 4.22–5.81)
RDW: 13.4 % (ref 11.5–15.5)
WBC: 4.6 10*3/uL (ref 4.0–10.5)

## 2016-02-03 LAB — VALPROIC ACID LEVEL: Valproic Acid Lvl: 41 ug/mL — ABNORMAL LOW (ref 50.0–100.0)

## 2016-02-03 LAB — BRAIN NATRIURETIC PEPTIDE: B Natriuretic Peptide: 116 pg/mL — ABNORMAL HIGH (ref 0.0–100.0)

## 2016-02-04 LAB — HEMOGLOBIN A1C
Hgb A1c MFr Bld: 9.1 % — ABNORMAL HIGH (ref 4.8–5.6)
Mean Plasma Glucose: 214 mg/dL

## 2016-02-15 ENCOUNTER — Encounter: Payer: Self-pay | Admitting: Internal Medicine

## 2016-02-15 ENCOUNTER — Non-Acute Institutional Stay (SKILLED_NURSING_FACILITY): Payer: Medicare Other | Admitting: Internal Medicine

## 2016-02-15 ENCOUNTER — Inpatient Hospital Stay (HOSPITAL_COMMUNITY)
Admit: 2016-02-15 | Discharge: 2016-02-15 | Disposition: A | Discharge status: Home / Self Care | Payer: Medicare PPO | Attending: Internal Medicine | Admitting: Internal Medicine

## 2016-02-15 DIAGNOSIS — F0390 Unspecified dementia without behavioral disturbance: Secondary | ICD-10-CM | POA: Diagnosis not present

## 2016-02-15 DIAGNOSIS — M25569 Pain in unspecified knee: Secondary | ICD-10-CM | POA: Diagnosis not present

## 2016-02-15 DIAGNOSIS — E1165 Type 2 diabetes mellitus with hyperglycemia: Secondary | ICD-10-CM | POA: Diagnosis not present

## 2016-02-15 DIAGNOSIS — E118 Type 2 diabetes mellitus with unspecified complications: Secondary | ICD-10-CM

## 2016-02-15 DIAGNOSIS — N189 Chronic kidney disease, unspecified: Secondary | ICD-10-CM | POA: Diagnosis not present

## 2016-02-15 DIAGNOSIS — I10 Essential (primary) hypertension: Secondary | ICD-10-CM | POA: Diagnosis not present

## 2016-02-15 NOTE — Progress Notes (Signed)
Patient ID: Todd [name], male   DOB: November 10, 1931, 82 y.o.   MRN: [id number]             ACILITY: [facility]    LEVEL OF CARE:   SNF  This is a routine-acute-  visit   CHIEF COMPLAINT:  Acute visit follow-up lower extremity edema--- on left-medical management of chronic medical issues including diabetes type 2-dementia-hypertension-anemia-renal insufficiency--history of syncope Acute visit secondary to follow-up fall      HISTORY OF PRESENT ILLNESS:   Patient is a pleasant elderly male with weakness  here for rehabilitation and monitoring--he had been hospitalized for altered mental status lethargy which apparently improved during  hospitalization he was empirically started apparently on Depakote for concerns he may be having seizures. After his discharge here He was found  to have increased edema of his left lower leg and  Doppler ultrasound was positive for DVT nonocclusive thrombus  on the proximal and distal left femoral vein and within the left popliteal vein he has completed a six-month course of Xarelto is now on aspirin.    I do note he did have a cardiac echo done in November 2015 which showed a vigorous ejection fraction of 55-60 percent with grade 1 diastolic dysfunction.  His BNP was 475 on lab done on 08/03/2015.  He is on hydrochlorothiazide low dose this was reduced secondary to history of some renal insufficiency was recent creatinine 1.4 BUN of 30 earlier this month on April 7 baseline appears to be around the mid ones     At times  appears to be a syncopal episode were he becomes unresponsive for short period-workup was negative that is usually the case-this is thought to be of unknown etiology possibly seizures he is on Depakote.  On most recent ER visit he was treated empirically for UTI on Keflex he does not complain of dysuria at this time.  His other medical issues appear to be stable he does have a history of diabetes is on Lantus 15 units  daily as well as NovoLog 10 units at meals-mealtime coverage was recently increased by Dr. Alwyn Ren secondary to some increase CBGs later in the day.  Blood sugars appear to be somewhat more stabilized although there is quite a bit of variability today morning sugar was 139 at noon 181 and at 4 PM to 75.  Morning sugars again have quite a bit of variability I do see during the past month 2 readings in the 70s but these are quite rare I also see readings of over 200-average appears to be more in the mid 100 range.  Later day parts sugars again are somewhat variable last few days at noon was mainly in the higher 100s-later in the day appears to be more in the 200s range although again there is variability at this point will monitor since medications were changed fairly recently  Patient apparently had a fall this morning with no apparent injuries however according nursing staff he is now complaining of some leg possibly some low back discomfort.  Patient is a poor strain secondary to dementia.  I also noted taking his blood pressure this evening it was 180/80--previously listed once 136/74-152/56 he is on Norvasc 5 mg a day and apparently has yet to receive that this afternoon.  He is also on hydrochlorothiazide 12.5 mg a day-lisinopril 40 mg a day-hydrochlorothiazide has been reduced in the past secondary to renal insufficiency issues.        PAST MEDICAL HISTORY/PROBLEM LIST:  Recurrent syncope.  Note that he had an admission from 08/18/2014 through 08/20/2014 for this issue, also 08/31/2014 through 09/01/2014 with altered mental status, 10/03/2014 through 10/05/2014 for ?TIA symptoms.  He has also presented with an unresponsive episode.    Hypertension.    Type 2 diabetes.       Dementia.    Chronic kidney disease with a baseline creatinine of 1.5 was recently improved at 0.98.    Coronary artery disease.   Status post CABG.    Hyperlipidemia. -Now off statins  secondary to elevated liver function tests   CURRENT MEDICATIONS:  Discharge medications include:       .       Depakote 250 mg q.12.    Hydrochlorothiazide 12.5 q.d.    Lantus 12 units daily at bedtime.    Lisinopril 40 mg QD.    Vitamin B12, 1000 U daily.    Vitamin D3, 1000 U daily.  Potassium 20 mEq a day  Aspirin 81 mg daily    Folic acid 1 mg daily  NovoLog insulin  10 units before meals meals if CBG greater than 200  SOCIAL HISTORY:                    HOUSING:  Patient previously lived at assisted living [place]-    CODE STATUS:  He does come with a DNR status.    FAMILY HISTORY:   Not currently available.    REVIEW OF SYSTEMS:--Limited secondary to dementia Please see history of present illness to have no complaints of pain shortness of breath chest pain    In general no complaints fever or chills.     Cardiac no chest  Pain-- mild left lower extremity edema- Respiratory-no cough or shortness of breath noted.  GI-apparently eats well does not complaining of abdominal discomfort nausea or vomiting diarrhea or constipation.  Musculoskeletal-is complaining of some  low back and leg pain   Neurologic again no syncopal episodes have been notedrecently.  Psych he does have significant dementia but behaviors have been well-controlled he's been pleasant and cooperative-.   [name]    PHYSICAL EXAMINATION:    He is afebrile pulse 66 respirations 20 blood pressure taken manually 180/80 weight is 195   GENERAL APPEARANCE:  The patient is not in any distress. He does speak but does not talk a lot  Probably on the severe side of dementia, but no evidence of delirium he is pleasant and follows simple verbal commands.-  His skin is warm and dry.--No increased bruising or bleeding noted-    Eyes pupils appear reactive light sclerae   clear --visual acuity appears grossly intact CHEST/RESPIRATORY:  Clear air entry bilaterally-no -labored breathing.     CARDIOVASCULAR:  CARDIAC:   Heart sounds are normal with no carotid bruits l1-2/6 systolic murmur  Regular  rate and rhythm.--With occasional irregular beats-1+ lower extremity on the left trace on the right  --legs are cool to touch nonerythematous nontender   GASTROINTESTINAL:  ABDOMEN:   Soft, nontender.  No masses. Positive bowel sounds   . [name] Musculoskeletal-moves all extremities 4 ---   -I do not note any deformities or pain with range of motion of his upper and  lower extremities SENSATION/STRENGTH:  Strength is normal--he is ambulatory moves all extremities at baseline I did not note any deformities. Possibly some mild tenderness to palpation of the back    Neurologic speech is clear I do not see any lateralizing findings  Psych He is  oriented to self will follow simple verbal commands  With prompting---continues to be pleasant and cooperative        Labs  02/03/2016.  WBC 4.6 hemoglobin 12.0 platelets 173.  Sodium 140 potassium 4.7 BUN 30 creatinine 1.4.  BNP 116.  Valproic acid 41.  Hemoglobin A1c 9.1  11/23/2015.  Sodium 141 potassium 3.9 BUN 27 creatinine 1.17 CO2 30.  11/21/2015.  Valproic acid 33.  WBC 5.8 hemoglobin 11.6 platelets 135.    10/05/2015.  Depakote level XXXIV.  Sodium 137 potassium 4.1 BUN 19 creatinine 0.98.  Albumin 3.4 otherwise liver function tests within normal limits.  WBC 4.5 hemoglobin 12.5 platelets 144.  As noted above back in October BNP was 475  09/03/2015.  Depakote level XLVII.  Sodium 139 potassium 4.2 BUN 28 creatinine 1.31.  Albumin 3.2.  Otherwise liver function tests within normal limits.  WBC 4.7 hemoglobin 10.2 platelets 128.  Hemoglobin A1c 7.9  07/27/2015.  WBC 3.8 hemoglobin 9.6 platelets 162.  Sodium 143 potassium 4 BUN 29 creatinine 1.32.  Albumin 2.9-.  08/03/2015-BNP 475    06/27/2015.  Sodium 142 potassium 4 BUN 28 creatinine 1.3.  Albumin 3.0 otherwise liver function  tests within normal limits.  CBC 4.5 hemoglobin 9.9 platelets 214.  Valproic acid level XXXIV     06/01/2015.  WBC 4.1 hemoglobin 11.4 platelets 127.  05/26/2015.  Sodium 141 potassium 4.3 BUN 31 creatinine 1.31.  AST 44-albumin 3.0 otherwise liver function tests within normal limits  04/08/2015.  Valproic acid 43.  Hemoglobin A1c 7.8 which actually is improvement from 3 months ago when it was 9.2 and 6 months ago when it was 12.3.  03/16/2015.  WBC 5.9 hemoglobin 10.2 platelets 161.  03/23/2015.  Sodium 142 potassium 3.8 BUN 20 creatinine 1.17.  Alb-3.2-otherwise liver function tests within normal limits.    12/23/2014.  WBC 4.5 hemoglobin 10.9 platelets 162.  Sodium 138 potassium 4.3 BUN 28 creatinine 1.19.  Liver function tests within normal limits  Cholesterol 189 triglycerides 101 HDL 44 LDL 125.  Hemoglobin A1c 9.2 Depakote level LXVII.6  .           [name].    ASSESSMENT/PLAN:        history of left leg DVT-  He has completed an extended courses Xarelto continues on aspirin--this has been stable  we did do a follow-up Doppler last monthsecondary to concerns some increase left lower extremity edema on the left this was negative                                                     Thrombocytopenia-platelets appear to have stabilized although again somewhat lower than normal range at times-Depakote has been titrated down secondary to concerns about this-most recent platelets 173,000 on lab done April 7  Renal insufficiency- This appears relatively baseline with creatinine 1.4 BUN of 30 on lab done earlier this month we'll recheck for stability.      Hypertension-  he is on lisinopril 40 mg a day hydrochlorothiazide 12.5 mg a day-blood pressures appear to be variable  most recent 136/74 but today I did take it and it was 180/88 appears this goes a bit higher and he receives his Norvasc and it comes down somewhat-nonetheless concerned  about the 180 systolic will increase his Norvasc to 10 mg a day and monitor this-v  blood pressure checks every 2 hours 2 then every shift      Question seizure disorder-he is on Depakote- This is been titrated down there been no recent reports of seizures level was 41 on lab done April 7   Hyperlipidemia- He is no longer on a statin secondary to elevated liver function tests--- I would be hesitant to restart t a statin secondary to this-as well as with his comorbidities and relatively advanced age   Dementia with behaviors-this appears to be stable apparently this was an issue at assisted living but his stay here behavior wise has been quite unremarkable he is pleasant I do not really note any issues per nursing--he apparently does have a history of intermittent syncopal episodes which are fairly rare as noted above   .  Weight loss He has rebounded back --  Current weight 195 this appears to be slowly trending up --he had lost significant weight previously   Anemia macrocytic-this appears to be  stable  he has been started on folic acid--hemoglobin of 12 appears to be stable this was done earlier this month  Diabetes type 2- Continues to have quite a very variability as noted above-at this point will monitor since fairly recently his mealtime insulin has been increased to 10 units-continues on 12 units of Lantus would be hesitant go up on this secondary to variable blood sugars in the morning  \History of back and leg pain status post fall physical exam was fairly benign but will order x-rays of the area including the lumbar sacral spine hips and legs.  [id number] note greater than 40 minutes spent assessing patient-discussing his status with nursing staff-and  coordinating and formulating a plan of care for numerous diagnoses-of note greater than 50% of time spent coordinating plan of care     .     [id number]--of note greater than 40 minutes spent assessing  patient- Reviewing his chart-discussing his status with nursing staff-and coordinating and formulating a plan of care for numerous diagnoses-of note greater than 50% of time spent coordinating plan of care

## 2016-02-16 ENCOUNTER — Encounter (HOSPITAL_COMMUNITY)
Admission: RE | Admit: 2016-02-16 | Discharge: 2016-02-16 | Disposition: A | Discharge status: Home / Self Care | Payer: Medicare Other | Source: Skilled Nursing Facility | Attending: *Deleted | Admitting: *Deleted

## 2016-02-16 DIAGNOSIS — I10 Essential (primary) hypertension: Secondary | ICD-10-CM | POA: Diagnosis not present

## 2016-02-16 LAB — COMPREHENSIVE METABOLIC PANEL
ALT: 15 U/L — ABNORMAL LOW (ref 17–63)
ANION GAP: 7 (ref 5–15)
AST: 17 U/L (ref 15–41)
Albumin: 3.1 g/dL — ABNORMAL LOW (ref 3.5–5.0)
Alkaline Phosphatase: 36 U/L — ABNORMAL LOW (ref 38–126)
BUN: 25 mg/dL — ABNORMAL HIGH (ref 6–20)
CHLORIDE: 107 mmol/L (ref 101–111)
CO2: 28 mmol/L (ref 22–32)
Calcium: 8.8 mg/dL — ABNORMAL LOW (ref 8.9–10.3)
Creatinine, Ser: 1.21 mg/dL (ref 0.61–1.24)
GFR, EST NON AFRICAN AMERICAN: 54 mL/min — AB (ref >60)
Glucose, Bld: 73 mg/dL (ref 65–99)
POTASSIUM: 3.7 mmol/L (ref 3.5–5.1)
Sodium: 142 mmol/L (ref 135–145)
TOTAL PROTEIN: 6.1 g/dL — AB (ref 6.5–8.1)
Total Bilirubin: 0.8 mg/dL (ref 0.3–1.2)

## 2016-02-17 ENCOUNTER — Non-Acute Institutional Stay (SKILLED_NURSING_FACILITY): Payer: Medicare Other | Admitting: Internal Medicine

## 2016-02-17 ENCOUNTER — Encounter: Payer: Self-pay | Admitting: Internal Medicine

## 2016-02-17 DIAGNOSIS — I10 Essential (primary) hypertension: Secondary | ICD-10-CM | POA: Diagnosis not present

## 2016-02-17 DIAGNOSIS — R55 Syncope and collapse: Secondary | ICD-10-CM | POA: Diagnosis not present

## 2016-02-17 NOTE — Progress Notes (Signed)
Patient ID: Todd [name], male   DOB: 1931-11-25, 82 y.o.   MRN: [id number]  Location:  [facility]   Place of Service:  SNF (31)  Todd Lor, NP  Patient Care Team: Todd Lor, NP as PCP - General (Nurse Practitioner)  Extended Emergency Contact Information Primary Emergency Contact: [name],Todd Address: [address] [place], [place] [address] [name] Home Phone: [phone] Mobile Phone: [phone] Relation: Daughter Secondary Emergency Contact: Todd [name] Address: [address]          [place], [place] [address] [name] Home Phone: [phone] Mobile Phone: [phone] Relation: Relative  Goals of care: Advanced Directive information Advanced Directives 02/17/2016  Does patient have an advance directive? Yes  Type of Advance Directive Out of facility DNR (pink MOST or yellow form)  Does patient want to make changes to advanced directive? No - Patient declined  Copy of advanced directive(s) in chart? Yes  Pre-existing out of facility DNR order (yellow form or pink MOST form) -     Chief Complaint  Patient presents with  . Acute Visit  Follow-up possible syncopal episode yesterday-follow-up hypertension  HPI:  Pt is a 82 y.o. male seen today for an acute visit for  A possible syncopal episode yesterday-apparently this was of short diarrhea she can when he was put back to bed and legs elevated he became unresponsive.  This is not a new situation is had these recurrent apparently for a number of years-he has been worked up in the ER with possible etiology thought seizures or vasovagal episode.  His vital signs were stable during the incident and he is back at his baseline today.  He is on Depakote twice a day for suspicion again there may be some seizure etiology here. Graft currently has no complaints no shortness breath no chest pain complaints he is a poor historian secondary to dementia.  We've  also been following his blood pressure and got 180/80 today he appears alert is quite a bit of variability here I see previous readings significantly lowetr  with occasional spikes  It appears that once he receives his blood pressure medicines his blood pressure goes down fairly significantly.    Past Medical History  Diagnosis Date  . Elevated cholesterol   . Hypertension   . Type II diabetes mellitus (HCC)   . Dementia     "don't know stage or type" (08/18/2014)  . Arthritis     "probably"  . Chronic kidney disease     "? stage" (08/18/2014)   Past Surgical History  Procedure Laterality Date  . Tonsillectomy    . Coronary artery bypass graft  ?1994    "CABG X3"  . Cataract extraction      No Known Allergies  Medications.  Depakote 250 mg twice a day.  Enteric coated aspirin 81 mg daily.  Folic acid 1 mg daily.  Hydrochlorothiazide 12.5 daily.  Potassium 20 mEq daily.  Lantus insulin 12 units daily at bedtime.  Norvasc 10 mg daily.  NovoLog sliding scale insulin 3 times a day with meals.  Vitamin D 12-998 units daily.     Review of Systems Limited secondary to dementia please see history of present illness again he is not complaining of any shortness breath chest pain or discomfort currently other than elevated blood pressure vital signs appear to be stable  There is no immunization history on file for this patient. Pertinent  Health Maintenance Due  Topic Date Due  . FOOT EXAM  11/23/1943  . OPHTHALMOLOGY EXAM  11/23/1943  . URINE MICROALBUMIN  11/23/1943  . PNA vac Low Risk Adult (1 of 2 - PCV13) 11/22/1998  . INFLUENZA VACCINE  05/29/2016  . HEMOGLOBIN A1C  08/04/2016   No flowsheet data found. Functional Status Survey:      Physical Exam   Temp  97.0 pulse 78 respirations 20 blood pressure 180/80 taken manually.  In general this is a fairly well-nourished daily male in no distress.  His skin is warm and dry.  Chest clear to  auscultation there is no labored breathing.  Heart is regular rate and rhythm with occasional irregular beat he has some chronic lower extremity edema somewhat more on the left versus the right which is chronic this actually appears somewhat reduced from exam couple days ago.  Neurologic is grossly intact speech is clear tongue is midline with full range of motion moves all extremities with baseline strength.  Abdomen is soft nontender with positive bowel sounds.  Muscle skeletal again moves all extremities 4 I do not note any deformities.  Psych he is oriented to self continues to be pleasant and appropriate apparently does have behaviors but this is quite rare.      Labs reviewed:  Recent Labs  01/11/16 0745 02/03/16 0900 02/16/16 0700  NA 142 140 142  K 3.8 4.7 3.7  CL 105 104 107  CO2 30 28 28   GLUCOSE 143* 226* 73  BUN 25* 30* 25*  CREATININE 1.22 1.40* 1.21  CALCIUM 8.8* 8.5* 8.8*    Recent Labs  01/04/16 0720 02/03/16 0900 02/16/16 0700  AST 19 22 17   ALT 19 20 15*  ALKPHOS 42 41 36*  BILITOT 0.6 0.4 0.8  PROT 6.5 6.3* 6.1*  ALBUMIN 3.4* 3.3* 3.1*    Recent Labs  11/21/15 0630 11/21/15 1559 01/04/16 0720 02/03/16 0900  WBC 4.5 5.8 4.0 4.6  NEUTROABS 2.2  --  1.6* 2.0  HGB 11.2* 11.6* 11.9* 12.0*  HCT 33.4* 34.4* 36.0* 35.8*  MCV 92.8 93.5 94.5 94.2  PLT 185 135* 169 173   Lab Results  Component Value Date   TSH 2.833 06/13/2015   Lab Results  Component Value Date   HGBA1C 9.1* 02/03/2016   Lab Results  Component Value Date   CHOL 189 12/23/2014   HDL 44 12/23/2014   LDLCALC 125* 12/23/2014   TRIG 101 12/23/2014   CHOLHDL 4.3 12/23/2014    Significant Diagnostic Results in last 30 days:  Dg Lumbar Spine Complete  02/15/2016  CLINICAL DATA:  82 year old male with history of fall at 6 a.m. this morning complaining of low back pain. EXAM: LUMBAR SPINE - COMPLETE 4+ VIEW COMPARISON:  No priors. FINDINGS: Multiple views of the lumbar  spine demonstrate no acute displaced fractures or compression type fractures of the lumbar spine. Alignment is anatomic. No defects of the pars interarticularis are noted. Multilevel degenerative disc disease throughout the lumbar spine, most severe at L2-L3 and L3-L4. Multilevel facet arthropathy, most severe at L4-L5 and L5-S1. Atherosclerotic calcifications throughout the visualized abdominal vasculature. IMPRESSION: 1. No acute radiographic abnormality of the lumbar spine. 2. Multilevel degenerative disc disease and lumbar spondylosis, as above. 3. Atherosclerosis. Electronically Signed   By: Trudie Reedaniel  Entrikin M.D.   On: 02/15/2016 20:07   Dg Pelvis 1-2 Views  02/15/2016  CLINICAL DATA:  82 year old male with history of unwitnessed fall today at 6 a.m. complaining of pain in  the pelvis and low back. EXAM: PELVIS - 1-2 VIEW COMPARISON:  No priors. FINDINGS: There is no evidence of pelvic fracture or diastasis. No pelvic bone lesions are seen. Moderate osteoarthritis in the hip joints bilaterally. IMPRESSION: 1. No acute radiographic abnormality of the bony pelvis on this single view examination. 2. Moderate bilateral hip joint osteoarthritis. Electronically Signed   By: Trudie Reed M.D.   On: 02/15/2016 20:08   Dg Tibia/fibula Left  02/15/2016  CLINICAL DATA:  Followup home today. Left leg injury and pain. Initial encounter. EXAM: LEFT TIBIA AND FIBULA - 2 VIEW COMPARISON:  None. FINDINGS: There is no evidence of fracture or other focal bone lesions. Mild osteoarthritis seen involving the knee joint. Extensive peripheral vascular calcification noted as well as surgical clips. IMPRESSION: No acute findings. Electronically Signed   By: Myles Rosenthal M.D.   On: 02/15/2016 20:16   Dg Tibia/fibula Right  02/15/2016  CLINICAL DATA:  Fall at home. Right leg injury and pain. Initial encounter. EXAM: RIGHT TIBIA AND FIBULA - 2 VIEW COMPARISON:  None. FINDINGS: There is no evidence of fracture or other focal  bone lesions. Mild knee joint osteoarthritis is seen. Extensive peripheral vascular calcification noted. IMPRESSION: No acute findings. Electronically Signed   By: Myles Rosenthal M.D.   On: 02/15/2016 20:18   Dg Femur Min 2 Views Left  02/15/2016  CLINICAL DATA:  82 year old male with history of unwitnessed fall this morning at 6 a.m. complaining of leg pain. EXAM: LEFT FEMUR 2 VIEWS COMPARISON:  No priors. FINDINGS: There is no evidence of fracture or other focal bone lesions. Numerous vascular calcifications. IMPRESSION: 1. Negative for fracture. 2. Atherosclerosis. Electronically Signed   By: Trudie Reed M.D.   On: 02/15/2016 20:09   Dg Femur, Min 2 Views Right  02/15/2016  CLINICAL DATA:  Fall today. Right femur injury and pain. Initial encounter. EXAM: RIGHT FEMUR 2 VIEWS COMPARISON:  None. FINDINGS: There is no evidence of fracture. Degenerative spurring patella noted. Extensive peripheral vascular calcification seen within the right thigh. IMPRESSION: No acute findings. Electronically Signed   By: Myles Rosenthal M.D.   On: 02/15/2016 20:15    Assessment/Plan  #1 syncopal episode question etiology vasovagal versus seizure-again he appears to be at his baseline this follows a familiar pattern-will check a valproic acid level tomorrow-again we have been judicious with this secondary to concerns about thrombocytopenia.  Again this is somewhat of challenging situation.  Of note we did do an EKG which does not show any acute changes per comparison with previous EKGs.  Labs done yesterday were unremarkable creatinine 1.21 BUN of 25 appears to be relatively baseline  #2 hypertension systolic is somewhat elevated this afternoon however he has just received his Norvasc and recheck a blood pressure was done in approximately an hour and blood pressure was down to 132/84-again this appears to be a fairly common occurrence as well.--This point monitor vital signs every shift  [id number]-of note greater  than 25 minutes spent assessing patient-reviewing his chart-discussing his status with nursing staff-and coordinating a plan of care.

## 2016-02-18 ENCOUNTER — Encounter (HOSPITAL_COMMUNITY)
Admission: RE | Admit: 2016-02-18 | Discharge: 2016-02-18 | Disposition: A | Discharge status: Home / Self Care | Payer: Medicare Other | Source: Skilled Nursing Facility | Attending: Internal Medicine | Admitting: Internal Medicine

## 2016-02-18 DIAGNOSIS — I10 Essential (primary) hypertension: Secondary | ICD-10-CM | POA: Diagnosis not present

## 2016-02-18 LAB — VALPROIC ACID LEVEL: VALPROIC ACID LVL: 39 ug/mL — AB (ref 50.0–100.0)

## 2016-02-25 NOTE — Progress Notes (Signed)
Patient ID: Todd FendtMilton [name], male   DOB: [birth year], 82 y.o.   MRN: [id number]

## 2016-03-27 ENCOUNTER — Encounter: Payer: Self-pay | Admitting: Internal Medicine

## 2016-03-27 ENCOUNTER — Non-Acute Institutional Stay (SKILLED_NURSING_FACILITY): Payer: Medicare Other | Admitting: Internal Medicine

## 2016-03-27 DIAGNOSIS — D649 Anemia, unspecified: Secondary | ICD-10-CM | POA: Diagnosis not present

## 2016-03-27 DIAGNOSIS — E1165 Type 2 diabetes mellitus with hyperglycemia: Secondary | ICD-10-CM | POA: Diagnosis not present

## 2016-03-27 DIAGNOSIS — N189 Chronic kidney disease, unspecified: Secondary | ICD-10-CM | POA: Diagnosis not present

## 2016-03-27 DIAGNOSIS — E118 Type 2 diabetes mellitus with unspecified complications: Secondary | ICD-10-CM

## 2016-03-27 NOTE — Patient Instructions (Signed)
New orders for Matrix entry. CBC and differential  BMET  TSH No change in Lantus dose as fasting glucoses are well controlled NovoLog 7 units before the evening meal

## 2016-03-27 NOTE — Progress Notes (Signed)
Patient ID: Todd [name], male   DOB: 1932/08/21, 82 y.o.   MRN: [id number]   Facility Location: [facility] Room Number: 104-D  This is a nursing facility follow up of chronic medical diagnoses  Interim medical record and care since last [facility] visit was updated with review of diagnostic studies and change in clinical status since last visit were documented.   HPI: [facility] following an inpatient evaluation of syncope.  Most recent comprehensive lab studies were 02/16/16. He has had intermittent mild renal compromise with creatinines as high as 1.40 . Glucoses have been  been elevated mainly in the evenings. His A1c was 9.1% on 02/03/16. He is  on 12 units of Lantus at bedtime and 10 units of short-acting insulin before each meal. He has not received the morning dose because of hypoglycemia. Fasting glucose was 73 on 4/20. He's exhibited mild anemia with hemoglobin of 12 and hematocrit 35.8.   Comprehensive review of systems: He denies any complaints. Veracity of review of systems is in question due to dementia.  Constitutional: No fever,significant weight change, fatigue  Eyes: No redness, discharge, pain, vision change ENT/mouth: No nasal congestion,  purulent discharge, earache,change in hearing ,sore throat  Cardiovascular: No chest pain, palpitations,paroxysmal nocturnal dyspnea, claudication, edema  Respiratory: No cough, sputum production,hemoptysis, DOE , significant snoring,apnea  Gastrointestinal: No heartburn,dysphagia,abdominal pain, nausea / vomiting,rectal bleeding, melena,change in bowels Genitourinary: No dysuria,hematuria, pyuria,  incontinence, nocturia Musculoskeletal: No joint stiffness, joint swelling, weakness,pain Dermatologic: No rash, pruritus, change in appearance of skin Neurologic: No dizziness,headache,syncope, seizures, numbness , tingling Psychiatric: No significant anxiety , depression, insomnia,  anorexia Endocrine: No change in hair/skin/ nails, excessive thirst, excessive hunger, excessive urination  Hematologic/lymphatic: No significant bruising, lymphadenopathy,abnormal bleeding  Physical exam:  Pertinent or positive findings: Ptosis present on the right. He is very pleasant and interactive but disoriented as to place, date, time, and President's name. 1/2+ pedal edema Decreased pedal pulses DTRs 1/2+. Fingernails are thickened and dark. General appearance:Adequately nourished; no acute distress , increased work of breathing is present.   Lymphatic: No lymphadenopathy about the head, neck, axilla . Eyes: No conjunctival inflammation or lid edema is present. There is no scleral icterus. Ears:  External ear exam shows no significant lesions or deformities.   Nose:  External nasal examination shows no deformity or inflammation. Nasal mucosa are pink and moist without lesions ,exudates Oral exam: lips and gums are healthy appearing.There is no oropharyngeal erythema or exudate . Neck:  No thyromegaly, masses, tenderness noted.    Heart:  Normal rate and regular rhythm. S1 and S2 normal without gallop, murmur, click, rub .  Lungs:Chest clear to auscultation without wheezes, rhonchi,rales , rubs. Abdomen:Bowel sounds are normal. Abdomen is soft and nontender with no organomegaly, hernias,masses. GU: deferred as previously addressed. Extremities:  No cyanosis, clubbing,edema  Neurologic exam : Strength equal  in upper & lower extremities Balance,Rhomberg,finger to nose testing could not be completed due to clinical state Deep tendon reflexes are equal Skin: Warm & dry w/o tenting. No significant lesions or rash.    See summary under each active problem in the Problem List with associated updated therapeutic plan

## 2016-03-27 NOTE — Assessment & Plan Note (Signed)
BMET 

## 2016-03-27 NOTE — Assessment & Plan Note (Signed)
As the glucoses are high between 4 and 8:30 PM and the fasting glucoses are well controlled; the NovoLog will be changed to 7 units only before the evening meal with no change in the Lantus Repeat A1c would be due in mid-August

## 2016-03-27 NOTE — Assessment & Plan Note (Signed)
CBC

## 2016-03-28 ENCOUNTER — Encounter (HOSPITAL_COMMUNITY)
Admission: RE | Admit: 2016-03-28 | Discharge: 2016-03-28 | Disposition: A | Discharge status: Home / Self Care | Payer: Medicare Other | Source: Skilled Nursing Facility | Attending: Internal Medicine | Admitting: Internal Medicine

## 2016-03-28 DIAGNOSIS — N183 Chronic kidney disease, stage 3 (moderate): Secondary | ICD-10-CM | POA: Insufficient documentation

## 2016-03-28 DIAGNOSIS — I10 Essential (primary) hypertension: Secondary | ICD-10-CM | POA: Diagnosis present

## 2016-03-28 DIAGNOSIS — E119 Type 2 diabetes mellitus without complications: Secondary | ICD-10-CM | POA: Insufficient documentation

## 2016-03-28 DIAGNOSIS — G40309 Generalized idiopathic epilepsy and epileptic syndromes, not intractable, without status epilepticus: Secondary | ICD-10-CM | POA: Diagnosis present

## 2016-03-28 LAB — CBC
HCT: 34.1 % — ABNORMAL LOW (ref 39.0–52.0)
Hemoglobin: 11.4 g/dL — ABNORMAL LOW (ref 13.0–17.0)
MCH: 31.4 pg (ref 26.0–34.0)
MCHC: 33.4 g/dL (ref 30.0–36.0)
MCV: 93.9 fL (ref 78.0–100.0)
PLATELETS: 210 10*3/uL (ref 150–400)
RBC: 3.63 MIL/uL — ABNORMAL LOW (ref 4.22–5.81)
RDW: 13.6 % (ref 11.5–15.5)
WBC: 4.4 10*3/uL (ref 4.0–10.5)

## 2016-03-28 LAB — BASIC METABOLIC PANEL
Anion gap: 6 (ref 5–15)
BUN: 32 mg/dL — AB (ref 6–20)
CALCIUM: 9 mg/dL (ref 8.9–10.3)
CO2: 28 mmol/L (ref 22–32)
CREATININE: 1.22 mg/dL (ref 0.61–1.24)
Chloride: 104 mmol/L (ref 101–111)
GFR calc non Af Amer: 53 mL/min — ABNORMAL LOW (ref >60)
GLUCOSE: 190 mg/dL — AB (ref 65–99)
Potassium: 3.8 mmol/L (ref 3.5–5.1)
Sodium: 138 mmol/L (ref 135–145)

## 2016-03-28 LAB — TSH: TSH: 4.562 u[IU]/mL — ABNORMAL HIGH (ref 0.350–4.500)

## 2016-04-18 ENCOUNTER — Non-Acute Institutional Stay (SKILLED_NURSING_FACILITY): Payer: Medicare Other | Admitting: Internal Medicine

## 2016-04-18 ENCOUNTER — Encounter: Payer: Self-pay | Admitting: Internal Medicine

## 2016-04-18 DIAGNOSIS — I10 Essential (primary) hypertension: Secondary | ICD-10-CM | POA: Diagnosis not present

## 2016-04-18 DIAGNOSIS — N189 Chronic kidney disease, unspecified: Secondary | ICD-10-CM | POA: Diagnosis not present

## 2016-04-18 DIAGNOSIS — E118 Type 2 diabetes mellitus with unspecified complications: Secondary | ICD-10-CM

## 2016-04-18 DIAGNOSIS — I82402 Acute embolism and thrombosis of unspecified deep veins of left lower extremity: Secondary | ICD-10-CM | POA: Diagnosis not present

## 2016-04-18 DIAGNOSIS — E1165 Type 2 diabetes mellitus with hyperglycemia: Secondary | ICD-10-CM

## 2016-04-18 DIAGNOSIS — F0390 Unspecified dementia without behavioral disturbance: Secondary | ICD-10-CM | POA: Diagnosis not present

## 2016-04-18 NOTE — Progress Notes (Signed)
Location:  [facility] Room Number: 104/D Place of Service:  SNF [phone]) Provider:  Keene Breath, NP  Patient Care Team: Marletta Lor, NP as PCP - General (Nurse Practitioner)  Extended Emergency Contact Information Primary Emergency Contact: Welborne,[name] Address: [address] [place], [place] [address] [name] Home Phone: [phone] Mobile Phone: [phone] Relation: Daughter Secondary Emergency Contact: Gibson Ramp Address: [address]          [place], [place] [address] [name] Home Phone: [phone] Mobile Phone: [phone] Relation: Relative  Code Status:  DNR Goals of care: Advanced Directive information Advanced Directives 04/18/2016  Does patient have an advance directive? Yes  Type of Advance Directive Out of facility DNR (pink MOST or yellow form)  Does patient want to make changes to advanced directive? No - Patient declined  Copy of advanced directive(s) in chart? Yes     Chief Complaint  Patient presents with  . Medical Management of Chronic Issues    Routine visit    HPI:  Pt is a 82 y.o. male seen today for Medical management of chronic medical issues including diabetes type 2-dementia-hypertension-anemia-renal insufficiency-history of syncope- He has been relatively stable here at times he does have a syncopal episode this is thought possibly to be seizure-like activity but he has not had one of these in some time.  He does continue on Depakote twice a day.  He also has a history of diabetes type 2 which is been somewhat difficult to control at times-times there's been concern about hypoglycemia in the morning however later his blood sugars appear to rise.  We have at times made various adjustments currently he is on Lantus 15 units daily at bedtime as well as NovoLog 10 units before lunch and supper.  Most recent CBG show relative stability fasting with sugars  largely in the mid ones occasionally higher reading but these appear to be outliers the highest ACs 311 which is quite rare.  At noon blood sugars are somewhat more elevated however with frequent readings above 300 and in the high 200s.  Sugars moderate somewhat at 5 PM ranging from the mid 100s up into the 300s at times.  According to nursing staff he does snack frequently and at times if food  left in the room will eat most of it.[name] Apparently at one time he ate most of a box of animal crackers.  He does have a history of hypertension which has had variability in the past but appears to have stabilized recently he is on Norvasc 10 mg a day-lisinopril 40 mg a day as well as hydrochlorothiazide 12.5 mg a day--recent blood pressures appear to be systolically in the 120s to 130s occasionally spike higher than that but this does not appear to be consistent.  Currently patient is in his room appears to be comfortable use about the lunch he does not complain of any pain shortness of breath-apparently his tachycardia was in the room today said he complain of some back pain yesterday but he denies this today-there is some suspicion he may have slept somewhat wrong the previous night which prompted those complaints.  He does ambulate but is quite unsteady and really needs assistance when he ambulates.  Of note recent TSH was elevated at 4.562 this was back on May 31 Dr. Alwyn Ren has started him on low dose Synthroid  He continues to be pleasant  smiling although significantly confused-at times he will have agitation but this appears to be more when he has a urinary tract infection  He does have a previous history of left lower extremity DVT this was a nonocclusive thrombus on the proximal and distal left femoral vein and within the left popliteal vein-he has completed a six-month course of Xarelto and is now on aspirin  He also has a history of renal insufficiency most recent creatinine of 1.2 on 03/28/2016  shows stability      Past Medical History  Diagnosis Date  . Elevated cholesterol   . Hypertension   . Type II diabetes mellitus (HCC)   . Dementia     "don't know stage or type" (08/18/2014)  . Arthritis     "probably"  . Chronic kidney disease     "? stage" (08/18/2014)   Past Surgical History  Procedure Laterality Date  . Tonsillectomy    . Coronary artery bypass graft  ?1994    "CABG X3"  . Cataract extraction      No Known Allergies  Current Outpatient Prescriptions on File Prior to Visit  Medication Sig Dispense Refill  . amLODipine (NORVASC) 10 MG tablet Take 10 mg by mouth daily.    [name] aspirin EC 81 MG tablet Take 81 mg by mouth daily.    . Cholecalciferol (VITAMIN D-3) 1000 UNITS CAPS Take 1,000 Units by mouth daily.    . divalproex (DEPAKOTE) 125 MG DR tablet 2 by mouth every 12 hours    . folic acid (FOLVITE) 1 MG tablet Take 1 mg by mouth daily.    . hydrochlorothiazide (MICROZIDE) 12.5 MG capsule Take 12.5 mg by mouth daily.    . insulin aspart (NOVOLOG) 100 UNIT/ML injection Inject 10 Units into the skin. 10 units pre lunch  and 10 units pre evening meal    . insulin glargine (LANTUS) 100 UNIT/ML injection Inject 15 Units into the skin at bedtime.     [name] lisinopril (PRINIVIL,ZESTRIL) 40 MG tablet Take 40 mg by mouth daily.    . potassium chloride (KLOR-CON) 20 MEQ packet Take 20 mEq by mouth daily.      No current facility-administered medications on file prior to visit.     Review of Systems  Essentially unattainable secondary to severe dementia please see history of present illness again he has had a period of stability blood sugars appear to be somewhat of a challenge however  Immunization History  Administered Date(s) Administered  . Influenza-Unspecified 05/29/2014  . PPD Test 11/01/2013, 10/18/2014  . Pneumococcal-Unspecified 11/22/1998   Pertinent  Health Maintenance Due  Topic Date Due  . PNA vac Low Risk Adult (2 of 2 - PCV13) 10/29/2016  (Originally 11/23/1999)  . URINE MICROALBUMIN  10/30/2023 (Originally 11/23/1941)  . INFLUENZA VACCINE  05/29/2016  . HEMOGLOBIN A1C  08/04/2016  . OPHTHALMOLOGY EXAM  08/07/2016  . FOOT EXAM  10/13/2016   No flowsheet data found. Functional Status Survey:    Filed Vitals:   04/18/16 1311  BP: 125/68  Pulse: 75  Temp: 98.3 F (36.8 C)  TempSrc: Oral  Resp: 19  Height: 5\' 7"  (1.702 m)  Weight: 200 lb (90.719 kg)   Body mass index is 31.32 kg/(m^2). Physical Exam   In general this is a pleasant elderly male in no distress sitting comfortably in his chair.  His skin is warm and dry.  Eyes pupils appear reactive light sclera and conjunctiva are clear visual acuity appears grossly intact.  Chest is  clear to auscultation there is no labored breathing.  Heart is largely regular rate and rhythm with an occasional irregular beat he has a slight 1 to 2/6 systolic murmur-has I would say trace edema on his right somewhat more on the left but this is chronic pedal pulses somewhat reduced bilaterally.  Abdomen soft nontender positive bowel sounds.  Musculoskeletal he is actually ambulatory but unsteady moves his extremities at baseline strength appears to be intact all 4 extremities-I could not really appreciate any back tenderness with palpation or significant deformity today.  Neurologic is grossly intact to speech is clear although pretty minimal secondary severe dementia no lateralizing findings cranial nerves are grossly intact.  Psych he does have significant dementia but is pleasant smiling with prompting does follow simple verbal commands  Labs reviewed:  Recent Labs  02/03/16 0900 02/16/16 0700 03/28/16 0705  NA 140 142 138  K 4.7 3.7 3.8  CL 104 107 104  CO2 28 28 28   GLUCOSE 226* 73 190*  BUN 30* 25* 32*  CREATININE 1.40* 1.21 1.22  CALCIUM 8.5* 8.8* 9.0    Recent Labs  01/04/16 0720 02/03/16 0900 02/16/16 0700  AST 19 22 17   ALT 19 20 15*  ALKPHOS 42 41  36*  BILITOT 0.6 0.4 0.8  PROT 6.5 6.3* 6.1*  ALBUMIN 3.4* 3.3* 3.1*    Recent Labs  11/21/15 0630  01/04/16 0720 02/03/16 0900 03/28/16 0705  WBC 4.5  < > 4.0 4.6 4.4  NEUTROABS 2.2  --  1.6* 2.0  --   HGB 11.2*  < > 11.9* 12.0* 11.4*  HCT 33.4*  < > 36.0* 35.8* 34.1*  MCV 92.8  < > 94.5 94.2 93.9  PLT 185  < > 169 173 210  < > = values in this interval not displayed. Lab Results  Component Value Date   TSH 4.562* 03/28/2016   Lab Results  Component Value Date   HGBA1C 9.1* 02/03/2016   Lab Results  Component Value Date   CHOL 189 12/23/2014   HDL 44 12/23/2014   LDLCALC 125* 12/23/2014   TRIG 101 12/23/2014   CHOLHDL 4.3 12/23/2014    Significant Diagnostic Results in last 30 days:  No results found.  Assessment/Plan  #1 diabetes type 2-as noted above this has been somewhat of a challenge morning sugars appear to be fairly satisfactory however later in the day has significant elevations this was discussed with Dr. Alwyn Ren as well-will increase his lunchtime NovoLog up to 15 units will continue the 10 units before supper and continue the Lantus 15 units daily at bedtime and monitor this was discussed with nursing as well.--Only give NovoLog with meals if CBG greater than 150  #2-hypertension this appears to be relatively well controlled now continues on hydrochlorothiazide 12.5 mg a day-lisinopril 40 mg a day-as well as Norvasc 10 mg a day.  #3 history of syncopal episodes-again this is intermittent and he has had a period of relative stability here as well-he is on Depakote prophylactically-at times he is had low platelets although this appears to have normalized on most recent labs will update this next week.  #4-history of dementia-this appears to be stable-with supportive care-currently not on any overt medication-apparently he eats very well nursing staff does not report any issues-although apparently at times he will have agitation when he has a UTI.  #5 history  renal insufficiency this appears to be stable with creatinine of 1.22 back on May 31 will update this next week as  well  #6 hypothyroidism he has been started on Synthroid by Dr. Alwyn RenHopper secondary a TSH of 4.562 on lab done in late May-will need an updated TSH in approximately 4 weeks.  #7 history of left leg DVT-again he has completed Xarelto is on aspirin now this appears to be stable still has some residual edema on the left.  #8-history of anemia this appears stable as well with a hemoglobin of 11.4 back on 03/28/2016 again will update lab next week.  [id number]-of note greater than 35 minutes spent assessing patient-discussing his status with nursing staff as well as with Dr. Claiborne RiggHopper-reviewing his chart-reviewing his labs-and coordinating and formulating a plan of care for numerous diagnoses-of note greater than 50% of time spent coordinating the plan of care          London SheerLuster, Sally C, [place]MA [phone]

## 2016-04-25 ENCOUNTER — Encounter (HOSPITAL_COMMUNITY)
Admission: RE | Admit: 2016-04-25 | Discharge: 2016-04-25 | Disposition: A | Discharge status: Home / Self Care | Payer: Medicare Other | Source: Skilled Nursing Facility | Attending: Internal Medicine | Admitting: Internal Medicine

## 2016-04-25 DIAGNOSIS — N183 Chronic kidney disease, stage 3 (moderate): Secondary | ICD-10-CM | POA: Diagnosis present

## 2016-04-25 DIAGNOSIS — E119 Type 2 diabetes mellitus without complications: Secondary | ICD-10-CM | POA: Diagnosis not present

## 2016-04-25 DIAGNOSIS — G40309 Generalized idiopathic epilepsy and epileptic syndromes, not intractable, without status epilepticus: Secondary | ICD-10-CM | POA: Diagnosis present

## 2016-04-25 DIAGNOSIS — I10 Essential (primary) hypertension: Secondary | ICD-10-CM | POA: Insufficient documentation

## 2016-04-25 LAB — CBC WITH DIFFERENTIAL/PLATELET
Basophils Absolute: 0 10*3/uL (ref 0.0–0.1)
Basophils Relative: 0 %
EOS ABS: 0.1 10*3/uL (ref 0.0–0.7)
EOS PCT: 3 %
HCT: 34.5 % — ABNORMAL LOW (ref 39.0–52.0)
Hemoglobin: 11.9 g/dL — ABNORMAL LOW (ref 13.0–17.0)
LYMPHS ABS: 1.8 10*3/uL (ref 0.7–4.0)
Lymphocytes Relative: 38 %
MCH: 32.7 pg (ref 26.0–34.0)
MCHC: 34.5 g/dL (ref 30.0–36.0)
MCV: 94.8 fL (ref 78.0–100.0)
MONOS PCT: 11 %
Monocytes Absolute: 0.5 10*3/uL (ref 0.1–1.0)
Neutro Abs: 2.2 10*3/uL (ref 1.7–7.7)
Neutrophils Relative %: 48 %
PLATELETS: 194 10*3/uL (ref 150–400)
RBC: 3.64 MIL/uL — ABNORMAL LOW (ref 4.22–5.81)
RDW: 13.3 % (ref 11.5–15.5)
WBC: 4.6 10*3/uL (ref 4.0–10.5)

## 2016-05-14 ENCOUNTER — Encounter (HOSPITAL_COMMUNITY)
Admission: RE | Admit: 2016-05-14 | Discharge: 2016-05-14 | Disposition: A | Discharge status: Home / Self Care | Payer: Medicare Other | Source: Skilled Nursing Facility | Attending: Internal Medicine | Admitting: Internal Medicine

## 2016-05-14 DIAGNOSIS — G40309 Generalized idiopathic epilepsy and epileptic syndromes, not intractable, without status epilepticus: Secondary | ICD-10-CM | POA: Diagnosis present

## 2016-05-14 DIAGNOSIS — N183 Chronic kidney disease, stage 3 (moderate): Secondary | ICD-10-CM | POA: Diagnosis present

## 2016-05-14 DIAGNOSIS — I10 Essential (primary) hypertension: Secondary | ICD-10-CM | POA: Diagnosis present

## 2016-05-14 DIAGNOSIS — E119 Type 2 diabetes mellitus without complications: Secondary | ICD-10-CM | POA: Diagnosis present

## 2016-05-14 LAB — CBC
HCT: 33.8 % — ABNORMAL LOW (ref 39.0–52.0)
HEMOGLOBIN: 11.4 g/dL — AB (ref 13.0–17.0)
MCH: 32 pg (ref 26.0–34.0)
MCHC: 33.7 g/dL (ref 30.0–36.0)
MCV: 94.9 fL (ref 78.0–100.0)
Platelets: 203 10*3/uL (ref 150–400)
RBC: 3.56 MIL/uL — ABNORMAL LOW (ref 4.22–5.81)
RDW: 13.3 % (ref 11.5–15.5)
WBC: 5.9 10*3/uL (ref 4.0–10.5)

## 2016-05-14 LAB — TSH: TSH: 3.848 u[IU]/mL (ref 0.350–4.500)

## 2016-05-16 ENCOUNTER — Encounter (HOSPITAL_COMMUNITY)
Admission: RE | Admit: 2016-05-16 | Discharge: 2016-05-16 | Disposition: A | Discharge status: Home / Self Care | Payer: Medicare Other | Source: Skilled Nursing Facility | Attending: *Deleted | Admitting: *Deleted

## 2016-05-16 DIAGNOSIS — E119 Type 2 diabetes mellitus without complications: Secondary | ICD-10-CM | POA: Diagnosis not present

## 2016-05-16 LAB — TSH: TSH: 2.909 u[IU]/mL (ref 0.350–4.500)

## 2016-05-29 ENCOUNTER — Non-Acute Institutional Stay (SKILLED_NURSING_FACILITY): Payer: Medicare Other | Admitting: Internal Medicine

## 2016-05-29 ENCOUNTER — Encounter (HOSPITAL_COMMUNITY)
Admission: RE | Admit: 2016-05-29 | Discharge: 2016-05-29 | Disposition: A | Discharge status: Home / Self Care | Payer: Medicare Other | Source: Skilled Nursing Facility | Attending: Internal Medicine | Admitting: Internal Medicine

## 2016-05-29 ENCOUNTER — Encounter: Payer: Self-pay | Admitting: Internal Medicine

## 2016-05-29 DIAGNOSIS — I10 Essential (primary) hypertension: Secondary | ICD-10-CM

## 2016-05-29 DIAGNOSIS — R600 Localized edema: Secondary | ICD-10-CM

## 2016-05-29 DIAGNOSIS — N390 Urinary tract infection, site not specified: Secondary | ICD-10-CM | POA: Diagnosis present

## 2016-05-29 DIAGNOSIS — N182 Chronic kidney disease, stage 2 (mild): Secondary | ICD-10-CM | POA: Diagnosis not present

## 2016-05-29 DIAGNOSIS — E1165 Type 2 diabetes mellitus with hyperglycemia: Secondary | ICD-10-CM | POA: Diagnosis not present

## 2016-05-29 DIAGNOSIS — E118 Type 2 diabetes mellitus with unspecified complications: Secondary | ICD-10-CM | POA: Diagnosis not present

## 2016-05-29 DIAGNOSIS — D649 Anemia, unspecified: Secondary | ICD-10-CM | POA: Diagnosis not present

## 2016-05-29 DIAGNOSIS — E119 Type 2 diabetes mellitus without complications: Secondary | ICD-10-CM | POA: Insufficient documentation

## 2016-05-29 DIAGNOSIS — N183 Chronic kidney disease, stage 3 (moderate): Secondary | ICD-10-CM | POA: Insufficient documentation

## 2016-05-29 NOTE — Assessment & Plan Note (Signed)
BMET 

## 2016-05-29 NOTE — Assessment & Plan Note (Signed)
The amlodipine may be contributing to his edema but it is not dramatic or progressive. Were it to progress, the amlodipine could be discontinued as a trial.

## 2016-05-29 NOTE — Patient Instructions (Signed)
New orders for [employer] entry. BMET  A1c Urine microalbumin

## 2016-05-29 NOTE — Assessment & Plan Note (Addendum)
Diabetes is very labile A1c , urine microalbumin,BMET

## 2016-05-29 NOTE — Progress Notes (Signed)
    Facility Location: [facility] Room Number: 104/D  This is a nursing facility follow up of chronic medical diagnoses  Interim medical record and care since last [facility] visit was updated with review of diagnostic studies and change in clinical status since last visit were documented.  Note: only family and social history pertinent to this assessment are included.  HPI: He has diagnoses of labile insulin-dependent diabetes, anemia, hypertension, hypothyroidism, and seizure disorder. His anemia has been essentially stable and CBC is current.  Despite diabetes his renal function has been stable. TSH is current and therapeutic. A1c verified  poorly controlled diabetes 02/03/16 with a value of 9.1. Fasting glucoses have ranged from 63-262. Premeal glucoses have ranged from 98-350 In addition to basal insulin 15 units at bedtime he is on NovoLog 10 units before lunch and the evening meal.   Review of systems is limited by his severe dementia. He is unable to give the date or the name of the president. He does deny symptoms of cardiovascular or endocrinologic nature but again the validity is in question.  Cardiovascular: No chest pain, palpitations,paroxysmal nocturnal dyspnea, claudication, edema  Respiratory: No DOE ,PND   Gastrointestinal: No heartburn,dysphagia,abdominal pain, nausea / vomiting,rectal bleeding, melena,change in bowels Genitourinary: No dysuria,hematuria, pyuria,  incontinence, nocturia Dermatologic: No rash, pruritus, change in appearance of skin Neurologic: No dizziness,headache,syncope, seizures, numbness , tingling Endocrine: No change in hair/skin/ nails, excessive thirst, excessive hunger, excessive urination    Physical exam:  Pertinent or positive findings: Pattern alopecia is present Dental hygiene is fair The second heart sound is slightly increased. He has 1+ pitting edema. Pedal pulses are decreased. Hyperpigmentation of the  fingernails present. He is wearing a elopement risk anklet. General appearance:Adequately nourished; no acute distress , increased work of breathing is present.   Lymphatic: No lymphadenopathy about the head, neck, axilla . Eyes: No conjunctival inflammation or lid edema is present. There is no scleral icterus. Ears:  External ear exam shows no significant lesions or deformities.   Nose:  External nasal examination shows no deformity or inflammation. Nasal mucosa are pink and moist without lesions ,exudates Oral exam: lips and gums are healthy appearing.There is no oropharyngeal erythema or exudate . Neck:  No thyromegaly, masses, tenderness noted.    Heart:  Normal rate and regular rhythm. S1  normal without gallop, murmur, click, rub .  Lungs:Chest clear to auscultation without wheezes, rhonchi,rales , rubs. Abdomen:Bowel sounds are normal. Abdomen is soft and nontender with no organomegaly, hernias,masses. GU: deferred as previously addressed. Extremities:  No cyanosis, clubbing  Neurologic exam : Strength equal  in upper & lower extremities Balance,Rhomberg,finger to nose testing could not be completed due to clinical state Deep tendon reflexes are equal but 1/2+ Skin: Warm & dry w/o tenting. No significant lesions or rash.    See summary under each active problem in the Problem List with associated updated therapeutic plan

## 2016-05-29 NOTE — Assessment & Plan Note (Signed)
Anemia stable to improved on serial values No change indicated

## 2016-05-29 NOTE — Assessment & Plan Note (Signed)
Blood pressure well controlled and renal function is stable No change indicated

## 2016-05-30 ENCOUNTER — Encounter (HOSPITAL_COMMUNITY)
Admission: RE | Admit: 2016-05-30 | Discharge: 2016-05-30 | Disposition: A | Discharge status: Home / Self Care | Payer: Medicare Other | Source: Skilled Nursing Facility | Attending: *Deleted | Admitting: *Deleted

## 2016-05-30 DIAGNOSIS — N390 Urinary tract infection, site not specified: Secondary | ICD-10-CM | POA: Diagnosis not present

## 2016-05-30 LAB — BASIC METABOLIC PANEL
Anion gap: 6 (ref 5–15)
BUN: 27 mg/dL — ABNORMAL HIGH (ref 6–20)
CALCIUM: 8.7 mg/dL — AB (ref 8.9–10.3)
CHLORIDE: 106 mmol/L (ref 101–111)
CO2: 28 mmol/L (ref 22–32)
CREATININE: 1.22 mg/dL (ref 0.61–1.24)
GFR calc non Af Amer: 53 mL/min — ABNORMAL LOW (ref >60)
Glucose, Bld: 91 mg/dL (ref 65–99)
Potassium: 3.9 mmol/L (ref 3.5–5.1)
SODIUM: 140 mmol/L (ref 135–145)

## 2016-05-31 LAB — HEMOGLOBIN A1C
HEMOGLOBIN A1C: 9.1 % — AB (ref 4.8–5.6)
MEAN PLASMA GLUCOSE: 214 mg/dL

## 2016-05-31 LAB — MICROALBUMIN, URINE: Microalb, Ur: 6.5 ug/mL — ABNORMAL HIGH

## 2016-06-28 ENCOUNTER — Encounter (HOSPITAL_COMMUNITY): Payer: Self-pay

## 2016-06-28 ENCOUNTER — Inpatient Hospital Stay (HOSPITAL_COMMUNITY)
Admission: EM | Admit: 2016-06-28 | Discharge: 2016-07-01 | DRG: 101 | Disposition: A | Discharge status: Skilled Nursing Facility | Payer: Medicare Other | Attending: Internal Medicine | Admitting: Internal Medicine

## 2016-06-28 ENCOUNTER — Emergency Department (HOSPITAL_COMMUNITY): Payer: Medicare Other

## 2016-06-28 DIAGNOSIS — R4182 Altered mental status, unspecified: Secondary | ICD-10-CM

## 2016-06-28 DIAGNOSIS — E118 Type 2 diabetes mellitus with unspecified complications: Secondary | ICD-10-CM

## 2016-06-28 DIAGNOSIS — E1122 Type 2 diabetes mellitus with diabetic chronic kidney disease: Secondary | ICD-10-CM | POA: Diagnosis present

## 2016-06-28 DIAGNOSIS — I639 Cerebral infarction, unspecified: Secondary | ICD-10-CM | POA: Diagnosis present

## 2016-06-28 DIAGNOSIS — R410 Disorientation, unspecified: Secondary | ICD-10-CM

## 2016-06-28 DIAGNOSIS — N184 Chronic kidney disease, stage 4 (severe): Secondary | ICD-10-CM | POA: Diagnosis present

## 2016-06-28 DIAGNOSIS — Z7982 Long term (current) use of aspirin: Secondary | ICD-10-CM

## 2016-06-28 DIAGNOSIS — Z87891 Personal history of nicotine dependence: Secondary | ICD-10-CM

## 2016-06-28 DIAGNOSIS — IMO0002 Reserved for concepts with insufficient information to code with codable children: Secondary | ICD-10-CM | POA: Diagnosis present

## 2016-06-28 DIAGNOSIS — R569 Unspecified convulsions: Secondary | ICD-10-CM

## 2016-06-28 DIAGNOSIS — I959 Hypotension, unspecified: Secondary | ICD-10-CM

## 2016-06-28 DIAGNOSIS — G9340 Encephalopathy, unspecified: Secondary | ICD-10-CM | POA: Diagnosis present

## 2016-06-28 DIAGNOSIS — Z66 Do not resuscitate: Secondary | ICD-10-CM | POA: Diagnosis present

## 2016-06-28 DIAGNOSIS — Z8673 Personal history of transient ischemic attack (TIA), and cerebral infarction without residual deficits: Secondary | ICD-10-CM

## 2016-06-28 DIAGNOSIS — J329 Chronic sinusitis, unspecified: Secondary | ICD-10-CM | POA: Diagnosis present

## 2016-06-28 DIAGNOSIS — E119 Type 2 diabetes mellitus without complications: Secondary | ICD-10-CM

## 2016-06-28 DIAGNOSIS — G20 Parkinson's disease: Secondary | ICD-10-CM | POA: Diagnosis present

## 2016-06-28 DIAGNOSIS — E860 Dehydration: Secondary | ICD-10-CM | POA: Diagnosis present

## 2016-06-28 DIAGNOSIS — E7800 Pure hypercholesterolemia, unspecified: Secondary | ICD-10-CM | POA: Diagnosis present

## 2016-06-28 DIAGNOSIS — Z951 Presence of aortocoronary bypass graft: Secondary | ICD-10-CM

## 2016-06-28 DIAGNOSIS — E1165 Type 2 diabetes mellitus with hyperglycemia: Secondary | ICD-10-CM | POA: Diagnosis present

## 2016-06-28 DIAGNOSIS — F0150 Vascular dementia without behavioral disturbance: Secondary | ICD-10-CM | POA: Diagnosis present

## 2016-06-28 DIAGNOSIS — Z794 Long term (current) use of insulin: Secondary | ICD-10-CM

## 2016-06-28 DIAGNOSIS — N183 Chronic kidney disease, stage 3 unspecified: Secondary | ICD-10-CM | POA: Diagnosis present

## 2016-06-28 DIAGNOSIS — F0390 Unspecified dementia without behavioral disturbance: Secondary | ICD-10-CM | POA: Diagnosis present

## 2016-06-28 DIAGNOSIS — I129 Hypertensive chronic kidney disease with stage 1 through stage 4 chronic kidney disease, or unspecified chronic kidney disease: Secondary | ICD-10-CM | POA: Diagnosis present

## 2016-06-28 DIAGNOSIS — N179 Acute kidney failure, unspecified: Secondary | ICD-10-CM | POA: Diagnosis present

## 2016-06-28 LAB — COMPREHENSIVE METABOLIC PANEL
ALBUMIN: 3.7 g/dL (ref 3.5–5.0)
ALT: 16 U/L — ABNORMAL LOW (ref 17–63)
AST: 19 U/L (ref 15–41)
Alkaline Phosphatase: 43 U/L (ref 38–126)
Anion gap: 12 (ref 5–15)
BILIRUBIN TOTAL: 0.3 mg/dL (ref 0.3–1.2)
BUN: 38 mg/dL — AB (ref 6–20)
CHLORIDE: 102 mmol/L (ref 101–111)
CO2: 25 mmol/L (ref 22–32)
Calcium: 8.9 mg/dL (ref 8.9–10.3)
Creatinine, Ser: 1.96 mg/dL — ABNORMAL HIGH (ref 0.61–1.24)
GFR calc Af Amer: 35 mL/min — ABNORMAL LOW (ref >60)
GFR calc non Af Amer: 30 mL/min — ABNORMAL LOW (ref >60)
GLUCOSE: 117 mg/dL — AB (ref 65–99)
POTASSIUM: 4.2 mmol/L (ref 3.5–5.1)
Sodium: 139 mmol/L (ref 135–145)
TOTAL PROTEIN: 7.1 g/dL (ref 6.5–8.1)

## 2016-06-28 LAB — APTT: APTT: 25 s (ref 24–36)

## 2016-06-28 LAB — DIFFERENTIAL
BASOS ABS: 0 10*3/uL (ref 0.0–0.1)
Basophils Relative: 0 %
EOS ABS: 0.2 10*3/uL (ref 0.0–0.7)
Eosinophils Relative: 2 %
LYMPHS ABS: 3 10*3/uL (ref 0.7–4.0)
Lymphocytes Relative: 30 %
Monocytes Absolute: 1.2 10*3/uL — ABNORMAL HIGH (ref 0.1–1.0)
Monocytes Relative: 12 %
NEUTROS ABS: 5.6 10*3/uL (ref 1.7–7.7)
NEUTROS PCT: 56 %

## 2016-06-28 LAB — PROTIME-INR
INR: 1.01
Prothrombin Time: 13.3 seconds (ref 11.4–15.2)

## 2016-06-28 LAB — CBC
HCT: 35.6 % — ABNORMAL LOW (ref 39.0–52.0)
HEMOGLOBIN: 12 g/dL — AB (ref 13.0–17.0)
MCH: 32.3 pg (ref 26.0–34.0)
MCHC: 33.7 g/dL (ref 30.0–36.0)
MCV: 96 fL (ref 78.0–100.0)
Platelets: 195 10*3/uL (ref 150–400)
RBC: 3.71 MIL/uL — AB (ref 4.22–5.81)
RDW: 13 % (ref 11.5–15.5)
WBC: 9.9 10*3/uL (ref 4.0–10.5)

## 2016-06-28 LAB — ETHANOL: Alcohol, Ethyl (B): 9 mg/dL — ABNORMAL HIGH (ref <5)

## 2016-06-28 LAB — I-STAT TROPONIN, ED: Troponin i, poc: 0 ng/mL (ref 0.00–0.08)

## 2016-06-28 LAB — VALPROIC ACID LEVEL: Valproic Acid Lvl: 37 ug/mL — ABNORMAL LOW (ref 50.0–100.0)

## 2016-06-28 MED ORDER — LEVETIRACETAM IN NACL 500 MG/100ML IV SOLN
INTRAVENOUS | Status: AC
Start: 2016-06-28 — End: 2016-06-28
  Filled 2016-06-28: qty 100

## 2016-06-28 MED ORDER — SODIUM CHLORIDE 0.9 % IV SOLN
500.0000 mg | Freq: Once | INTRAVENOUS | Status: AC
  Administered 2016-06-28: 500 mg via INTRAVENOUS
  Filled 2016-06-28: qty 5

## 2016-06-28 MED ORDER — LORAZEPAM 2 MG/ML IJ SOLN
1.0000 mg | Freq: Once | INTRAMUSCULAR | Status: AC
  Administered 2016-06-28: 1 mg via INTRAVENOUS
  Filled 2016-06-28: qty 1

## 2016-06-28 MED ORDER — LORAZEPAM 2 MG/ML IJ SOLN
1.0000 mg | Freq: Once | INTRAMUSCULAR | Status: AC
  Administered 2016-06-28: 1 mg via INTRAVENOUS

## 2016-06-28 MED ORDER — LORAZEPAM 2 MG/ML IJ SOLN
INTRAMUSCULAR | Status: AC
  Filled 2016-06-28: qty 1

## 2016-06-28 NOTE — ED Provider Notes (Signed)
[facility]-EM[facility]Y DEPT Provider Note   CSN: [id number] Arrival date & time: 06/28/16  2256  By signing my name below, I, Nelwyn Salisbury, attest that this documentation has been prepared under the direction and in the presence of Devoria Albe, MD. Electronically Signed: Nelwyn Salisbury, Scribe. 06/28/2016. 11:00 PM.  Time Seen 22:59 PM  History   Chief Complaint Chief Complaint  Patient presents with  . Code Stroke   LEVEL 5 CAVEAT PATIENT IS NON-VERBAL  The history is provided by the EMS personnel. No language interpreter was used.    HPI Comments:  Todd [name] is a 82 y.o. male with PMHx of DM and HTN who is brought in by EMS to the Emergency Department presenting with sudden-onset stroke symptoms beginning around 10:40 PM, last seen normal at 10:10 PM. EMS reports that 30 minutes before they found him he was walking and talking fine but was later found found slumped over with slurred speech and difficulty with LUE. Pt reports associated arm pain. Pt is a resident at the [facility] center.  PCP Dr Einar Crow  Patient is Do Not Resuscitate   Past Medical History:  Diagnosis Date  . Arthritis    "probably"  . Chronic kidney disease    "? stage" (08/18/2014)  . Dementia    "don't know stage or type" (08/18/2014)  . Elevated cholesterol   . Hypertension   . Type II diabetes mellitus [facility]     Patient Active Problem List   Diagnosis Date Noted  . Seizure (HCC) 06/29/2016  . Weight gain 08/02/2015  . Anemia, unspecified 06/22/2015  . Conjunctivitis 06/22/2015  . Unresponsive episode 03/20/2015  . Edema 03/11/2015  . Thrombocytopenia (HCC) 11/29/2014  . DVT (deep venous thrombosis) (HCC) 11/28/2014  . Hypokalemia 11/24/2014  . Syncope 10/16/2014  . CVA (cerebral infarction)   . Diabetes mellitus type 2, uncontrolled, with complications (HCC) 08/18/2014  . CAD (coronary atherosclerotic disease) 08/18/2014  . S/P CABG (coronary artery bypass graft) 08/18/2014  . CKD (chronic  kidney disease) 08/18/2014  . Dementia 08/18/2014  . HTN (hypertension) 08/18/2014  . Dyslipidemia 08/18/2014    Past Surgical History:  Procedure Laterality Date  . CATARACT EXTRACTION    . CORONARY ARTERY BYPASS GRAFT  ?1994   "CABG X3"  . TONSILLECTOMY         Home Medications    Prior to Admission medications   Medication Sig Start Date End Date Taking? Authorizing Provider  amLODipine (NORVASC) 10 MG tablet Take 10 mg by mouth daily.    Historical Provider, MD  aspirin EC 81 MG tablet Take 81 mg by mouth daily.    Historical Provider, MD  Cholecalciferol (VITAMIN D-3) 1000 UNITS CAPS Take 1,000 Units by mouth daily.    Historical Provider, MD  divalproex (DEPAKOTE) 125 MG DR tablet 2 by mouth every 12 hours    Historical Provider, MD  folic acid (FOLVITE) 1 MG tablet Take 1 mg by mouth daily.    Historical Provider, MD  hydrochlorothiazide (MICROZIDE) 12.5 MG capsule Take 12.5 mg by mouth daily.    Historical Provider, MD  insulin aspart (NOVOLOG) 100 UNIT/ML injection 10 units pre evening meal, give with lunch if CBG is greater than 150    Historical Provider, MD  insulin glargine (LANTUS) 100 UNIT/ML injection Inject 15 Units into the skin at bedtime.     Historical Provider, MD  levothyroxine (SYNTHROID, LEVOTHROID) 25 MCG tablet Take 25 mcg by mouth daily before breakfast.    Historical Provider, MD  lisinopril (PRINIVIL,ZESTRIL) 40 MG tablet Take 40 mg by mouth daily.    Historical Provider, MD  potassium chloride (KLOR-CON) 20 MEQ packet Take 20 mEq by mouth daily.     Historical Provider, MD    Family History No family history on file.  Social History Social History  Substance Use Topics  . Smoking status: Former Smoker    Types: Pipe  . Smokeless tobacco: Never Used     Comment: "quit smoking a pipe in the 80's"  . Alcohol use No  living in a nursing facility   Allergies   Review of patient's allergies indicates no known allergies.   Review of  Systems Review of Systems  Unable to perform ROS: Patient nonverbal     Physical Exam Updated Vital Signs ED Triage Vitals  Enc Vitals Group     BP 06/28/16 2300 146/85     Pulse Rate 06/28/16 2300 85     Resp 06/28/16 2300 18     Temp 06/28/16 2303 98 F (36.7 C)     Temp Source 06/28/16 2303 Oral     SpO2 06/28/16 2300 98 %     Weight --      Height --      Head Circumference --      Peak Flow --      Pain Score --      Pain Loc --      Pain Edu? --      Excl. in GC? --    Laboratory interpretation all normal   Physical Exam  Constitutional: He appears well-developed and well-nourished.  Non-toxic appearance. He does not appear ill. No distress.  awake  HENT:  Head: Normocephalic and atraumatic.  Right Ear: External ear normal.  Left Ear: External ear normal.  Nose: Nose normal. No mucosal edema or rhinorrhea.  Mouth/Throat: Oropharynx is clear and moist and mucous membranes are normal. No dental abscesses or uvula swelling.  Eyes: Conjunctivae and EOM are normal. Pupils are equal, round, and reactive to light.  Neck: Normal range of motion and full passive range of motion without pain. Neck supple.  Cardiovascular: Normal rate, regular rhythm, normal heart sounds and intact distal pulses.  Exam reveals no gallop and no friction rub.   No murmur heard. Pulmonary/Chest: Effort normal and breath sounds normal. No respiratory distress. He has no wheezes. He has no rhonchi. He has no rales. He exhibits no tenderness and no crepitus.  Abdominal: Soft. Normal appearance and bowel sounds are normal. He exhibits no distension. There is no tenderness. There is no rebound and no guarding.  Neurological: He has normal strength.  Patient has his LUE flexed at the elbow laying on his chest, He is unable to grip with his left hand. When asked to move his LUE he does flex and move it at the elbow but is not able to lift it away from his body. He can flex both knees. He is unable to  cooperate with cranial nerve exam. Speech is mumbled.   Skin: Skin is warm, dry and intact. No rash noted. No erythema. No pallor.  Psychiatric: He has a normal mood and affect. His speech is normal and behavior is normal. Judgment normal. His mood appears not anxious.  Nursing note and vitals reviewed.    ED Treatments / Results  Labs (all labs ordered are listed, but only abnormal results are displayed) Results for orders placed or performed during the hospital encounter of 06/28/16  Ethanol  Result Value  Ref Range   Alcohol, Ethyl (B) 9 (H) <5 mg/dL  Protime-INR  Result Value Ref Range   Prothrombin Time 13.3 11.4 - 15.2 seconds   INR 1.01   APTT  Result Value Ref Range   aPTT 25 24 - 36 seconds  CBC  Result Value Ref Range   WBC 9.9 4.0 - 10.5 K/uL   RBC 3.71 (L) 4.22 - 5.81 MIL/uL   Hemoglobin 12.0 (L) 13.0 - 17.0 g/dL   HCT 16.1 (L) 09.6 - 04.5 %   MCV 96.0 78.0 - 100.0 fL   MCH 32.3 26.0 - 34.0 pg   MCHC 33.7 30.0 - 36.0 g/dL   RDW 40.9 81.1 - 91.4 %   Platelets 195 150 - 400 K/uL  Differential  Result Value Ref Range   Neutrophils Relative % 56 %   Neutro Abs 5.6 1.7 - 7.7 K/uL   Lymphocytes Relative 30 %   Lymphs Abs 3.0 0.7 - 4.0 K/uL   Monocytes Relative 12 %   Monocytes Absolute 1.2 (H) 0.1 - 1.0 K/uL   Eosinophils Relative 2 %   Eosinophils Absolute 0.2 0.0 - 0.7 K/uL   Basophils Relative 0 %   Basophils Absolute 0.0 0.0 - 0.1 K/uL  Comprehensive metabolic panel  Result Value Ref Range   Sodium 139 135 - 145 mmol/L   Potassium 4.2 3.5 - 5.1 mmol/L   Chloride 102 101 - 111 mmol/L   CO2 25 22 - 32 mmol/L   Glucose, Bld 117 (H) 65 - 99 mg/dL   BUN 38 (H) 6 - 20 mg/dL   Creatinine, Ser 7.82 (H) 0.61 - 1.24 mg/dL   Calcium 8.9 8.9 - 95.6 mg/dL   Total Protein 7.1 6.5 - 8.1 g/dL   Albumin 3.7 3.5 - 5.0 g/dL   AST 19 15 - 41 U/L   ALT 16 (L) 17 - 63 U/L   Alkaline Phosphatase 43 38 - 126 U/L   Total Bilirubin 0.3 0.3 - 1.2 mg/dL   GFR calc non Af Amer  30 (L) >60 mL/min   GFR calc Af Amer 35 (L) >60 mL/min   Anion gap 12 5 - 15  Valproic acid level  Result Value Ref Range   Valproic Acid Lvl 37 (L) 50.0 - 100.0 ug/mL  I-stat troponin, ED (not at [facility])  Result Value Ref Range   Troponin i, poc 0.00 0.00 - 0.08 ng/mL   Comment 3           Laboratory interpretation all normal except subtherapeutic valproic acid level, renal insufficiency, mild anemia    EKG  EKG Interpretation  Date/Time:  Thursday June 28 2016 22:58:14 EDT Ventricular Rate:  83 PR Interval:    QRS Duration: 111 QT Interval:  406 QTC Calculation: 478 R Axis:   13 Text Interpretation:  Sinus rhythm Multiple premature complexes, vent & supraven Inferior infarct, old Consider anterior infarct Electrode noise No significant change since last tracing 16 Mar 2015 Confirmed by Ignatius Kloos  MD-I, [place] Yeats ([id number]) on 06/29/2016 12:21:19 AM       Radiology Ct Head Code Stroke Wo Contrast`  Result Date: 06/28/2016 CLINICAL DATA:  Code stroke.  Sudden onset left-sided weakness EXAM: CT HEAD WITHOUT CONTRAST TECHNIQUE: Contiguous axial images were obtained from the base of the skull through the vertex without intravenous contrast. COMPARISON:  Head CT 10/02/2015 FINDINGS: Cortical gray-white differentiation is preserved. The basal ganglia and insular cortices are normal. No extra-axial collection, subarachnoid hemorrhage or intraparenchymal hematoma. No  mass lesion, midline shift or hydrocephalus.B there is a small dural-based calcification along the right frontal convexity, possibly a small meningioma. Complete opacification of the left sphenoid sinus and posterior ethmoid air cells, unchanged. Mastoids are clear. Normal orbits. ASPECTS [facility] Stroke Program Early CT Score) - Ganglionic level infarction (caudate, lentiform nuclei, internal capsule, insula, M1-M3 cortex): 7 - Supraganglionic infarction (M4-M6 cortex): 3 Total score (0-10 with 10 being normal): 10 IMPRESSION: 1. No  acute intracranial hemorrhage. 2. ASPECTS is 10.  No CT evidence of acute cortical infarct. 3. Small dural base calcification along the right frontal convexity, possibly a small meningioma. Electronically Signed   By: Deatra Robinson M.D.   On: 06/28/2016 23:38    Procedures Procedures (including critical care time)  Medications Ordered in ED Medications  LORazepam (ATIVAN) 2 MG/ML injection (not administered)  LORazepam (ATIVAN) injection 1 mg (1 mg Intravenous Given 06/28/16 2340)  levETIRAcetam (KEPPRA) 500 mg in sodium chloride 0.9 % 100 mL IVPB (0 mg Intravenous Stopped 06/29/16 0049)  LORazepam (ATIVAN) injection 1 mg (1 mg Intravenous Given 06/28/16 2348)  LORazepam (ATIVAN) injection 1 mg (1 mg Intravenous Given 06/28/16 2354)  sodium chloride 0.9 % bolus 500 mL (500 mLs Intravenous New Bag/Given 06/29/16 0037)     Initial Impression / Assessment and Plan / ED Course  I have reviewed the triage vital signs and the nursing notes.  Pertinent labs & imaging results that were available during my care of the patient were reviewed by me and considered in my medical decision making (see chart for details).  Clinical Course   COORDINATION OF CARE:  Code stroke initiated, valproic acid level added to labs. ? Hx of seizures but not in problem list why he is on valproic acid.   2303Arline Asp, RN from [facility] got information about patient and will arrange to have a STAT neurology consult  2315: Patient returned from CT scan. Speech still mumbled. He can flex at the elbow of LUE but cannot lift his arm away from his body. He now can smile.   2335: Dr. Keane Police, Neurologist on call, feels patient is having focal seizures rather than a stroke because he is now having twitching of LLE which he was not having earlier, recommends Ativan 1mg  IV prn plus start on Keppra 500mg  IV.  2336: Radiology called CT head results. Does not see evidence of acute or old stroke.   00:07 AM Dr Keane Police, pt has  had ativan 3 mg IV and the twitching is now gone. BP now however is 84/31, HR 83, pulse ox 97%, pt sleeping.  Feels need admission for seizure work up.  Gave give keppra bolus up to 1500 mg if needed. Fluid bolus given for hypotension from ativan.   00:55 AM Dr Onalee Hua, hospitalist, admit to step down BP 113/47, HR 72 pulse ox 100 %  Final Clinical Impressions(s) / ED Diagnoses   Final diagnoses:  Altered mental status, unspecified altered mental status type  Seizure (HCC)  Hypotension, unspecified hypotension type   Plan admit  Devoria Albe, MD, FACEP  CRITICAL CARE Performed by: Jovanny Stephanie L Vernis [place]gan Total critical care time: 37 minutes Critical care time was exclusive of separately billable procedures and treating other patients. Critical care was necessary to treat or prevent imminent or life-threatening deterioration. Critical care was time spent personally by me on the following activities: development of treatment plan with patient and/or surrogate as well as nursing, discussions with consultants, evaluation of patient's response to treatment, examination of patient,  obtaining history from patient or surrogate, ordering and performing treatments and interventions, ordering and review of laboratory studies, ordering and review of radiographic studies, pulse oximetry and re-evaluation of patient's condition.   I personally performed the services described in this documentation, which was scribed in my presence. The recorded information has been reviewed and considered.  Devoria AlbeIva Ryler Laskowski, MD, Concha PyoFACEP     Jasslyn Finkel, MD 06/29/16 [phone]

## 2016-06-28 NOTE — ED Triage Notes (Signed)
Pt is a resident [facility] where pt was reportedly seen normal, walking in the hallway at approx 2210.  When they went to check on the pt in his room at 2240, he was slumped over in a chair and was not able to talk, had left arm weakness and slurring of speech.  Pt is not able to cooperate with Neuro exam.

## 2016-06-29 ENCOUNTER — Encounter (HOSPITAL_COMMUNITY): Payer: Self-pay | Admitting: Family Medicine

## 2016-06-29 ENCOUNTER — Inpatient Hospital Stay (HOSPITAL_COMMUNITY): Payer: Medicare Other

## 2016-06-29 ENCOUNTER — Inpatient Hospital Stay (HOSPITAL_COMMUNITY)
Admit: 2016-06-29 | Discharge: 2016-06-29 | Disposition: A | Discharge status: Home / Self Care | Payer: Medicare Other | Attending: Family Medicine | Admitting: Family Medicine

## 2016-06-29 DIAGNOSIS — E1165 Type 2 diabetes mellitus with hyperglycemia: Secondary | ICD-10-CM | POA: Diagnosis present

## 2016-06-29 DIAGNOSIS — G20 Parkinson's disease: Secondary | ICD-10-CM | POA: Diagnosis present

## 2016-06-29 DIAGNOSIS — S42212A Unspecified displaced fracture of surgical neck of left humerus, initial encounter for closed fracture: Secondary | ICD-10-CM | POA: Diagnosis not present

## 2016-06-29 DIAGNOSIS — N179 Acute kidney failure, unspecified: Secondary | ICD-10-CM | POA: Diagnosis present

## 2016-06-29 DIAGNOSIS — Z794 Long term (current) use of insulin: Secondary | ICD-10-CM | POA: Diagnosis not present

## 2016-06-29 DIAGNOSIS — R410 Disorientation, unspecified: Secondary | ICD-10-CM | POA: Diagnosis not present

## 2016-06-29 DIAGNOSIS — Y929 Unspecified place or not applicable: Secondary | ICD-10-CM | POA: Diagnosis not present

## 2016-06-29 DIAGNOSIS — Z7982 Long term (current) use of aspirin: Secondary | ICD-10-CM | POA: Diagnosis not present

## 2016-06-29 DIAGNOSIS — R4182 Altered mental status, unspecified: Secondary | ICD-10-CM

## 2016-06-29 DIAGNOSIS — F0390 Unspecified dementia without behavioral disturbance: Secondary | ICD-10-CM | POA: Diagnosis present

## 2016-06-29 DIAGNOSIS — E7800 Pure hypercholesterolemia, unspecified: Secondary | ICD-10-CM | POA: Diagnosis present

## 2016-06-29 DIAGNOSIS — J329 Chronic sinusitis, unspecified: Secondary | ICD-10-CM | POA: Diagnosis present

## 2016-06-29 DIAGNOSIS — E860 Dehydration: Secondary | ICD-10-CM | POA: Diagnosis present

## 2016-06-29 DIAGNOSIS — N184 Chronic kidney disease, stage 4 (severe): Secondary | ICD-10-CM | POA: Diagnosis present

## 2016-06-29 DIAGNOSIS — I6310 Cerebral infarction due to embolism of unspecified precerebral artery: Secondary | ICD-10-CM

## 2016-06-29 DIAGNOSIS — Y939 Activity, unspecified: Secondary | ICD-10-CM | POA: Diagnosis not present

## 2016-06-29 DIAGNOSIS — R569 Unspecified convulsions: Secondary | ICD-10-CM

## 2016-06-29 DIAGNOSIS — Z951 Presence of aortocoronary bypass graft: Secondary | ICD-10-CM

## 2016-06-29 DIAGNOSIS — Y999 Unspecified external cause status: Secondary | ICD-10-CM | POA: Diagnosis not present

## 2016-06-29 DIAGNOSIS — G9340 Encephalopathy, unspecified: Secondary | ICD-10-CM | POA: Diagnosis present

## 2016-06-29 DIAGNOSIS — N189 Chronic kidney disease, unspecified: Secondary | ICD-10-CM | POA: Diagnosis not present

## 2016-06-29 DIAGNOSIS — I129 Hypertensive chronic kidney disease with stage 1 through stage 4 chronic kidney disease, or unspecified chronic kidney disease: Secondary | ICD-10-CM | POA: Diagnosis present

## 2016-06-29 DIAGNOSIS — Z87891 Personal history of nicotine dependence: Secondary | ICD-10-CM | POA: Diagnosis not present

## 2016-06-29 DIAGNOSIS — Z8673 Personal history of transient ischemic attack (TIA), and cerebral infarction without residual deficits: Secondary | ICD-10-CM | POA: Diagnosis not present

## 2016-06-29 DIAGNOSIS — S4992XA Unspecified injury of left shoulder and upper arm, initial encounter: Secondary | ICD-10-CM | POA: Diagnosis present

## 2016-06-29 DIAGNOSIS — E119 Type 2 diabetes mellitus without complications: Secondary | ICD-10-CM

## 2016-06-29 DIAGNOSIS — E1122 Type 2 diabetes mellitus with diabetic chronic kidney disease: Secondary | ICD-10-CM | POA: Diagnosis present

## 2016-06-29 DIAGNOSIS — Z66 Do not resuscitate: Secondary | ICD-10-CM | POA: Diagnosis present

## 2016-06-29 DIAGNOSIS — E118 Type 2 diabetes mellitus with unspecified complications: Secondary | ICD-10-CM

## 2016-06-29 DIAGNOSIS — X58XXXA Exposure to other specified factors, initial encounter: Secondary | ICD-10-CM | POA: Diagnosis not present

## 2016-06-29 DIAGNOSIS — I2510 Atherosclerotic heart disease of native coronary artery without angina pectoris: Secondary | ICD-10-CM | POA: Diagnosis not present

## 2016-06-29 LAB — RAPID URINE DRUG SCREEN, HOSP PERFORMED
Amphetamines: NOT DETECTED
BARBITURATES: NOT DETECTED
BENZODIAZEPINES: NOT DETECTED
COCAINE: NOT DETECTED
Opiates: NOT DETECTED
TETRAHYDROCANNABINOL: NOT DETECTED

## 2016-06-29 LAB — URINALYSIS, ROUTINE W REFLEX MICROSCOPIC
GLUCOSE, UA: NEGATIVE mg/dL
HGB URINE DIPSTICK: NEGATIVE
KETONES UR: 15 mg/dL — AB
Leukocytes, UA: NEGATIVE
Nitrite: NEGATIVE
Specific Gravity, Urine: >1.03 — ABNORMAL HIGH (ref 1.005–1.030)
pH: 5.5 (ref 5.0–8.0)

## 2016-06-29 LAB — GLUCOSE, CAPILLARY
GLUCOSE-CAPILLARY: 119 mg/dL — AB (ref 65–99)
GLUCOSE-CAPILLARY: 114 mg/dL — AB (ref 65–99)
GLUCOSE-CAPILLARY: 132 mg/dL — AB (ref 65–99)
Glucose-Capillary: 112 mg/dL — ABNORMAL HIGH (ref 65–99)
Glucose-Capillary: 146 mg/dL — ABNORMAL HIGH (ref 65–99)

## 2016-06-29 LAB — TROPONIN I
Troponin I: <0.03 ng/mL (ref <0.03)
Troponin I: <0.03 ng/mL (ref <0.03)

## 2016-06-29 LAB — TSH: TSH: 12.424 u[IU]/mL — AB (ref 0.350–4.500)

## 2016-06-29 LAB — URINE MICROSCOPIC-ADD ON

## 2016-06-29 LAB — MRSA PCR SCREENING: MRSA BY PCR: NEGATIVE

## 2016-06-29 MED ORDER — ORAL CARE MOUTH RINSE
15.0000 mL | Freq: Two times a day (BID) | OROMUCOSAL | Status: DC
  Administered 2016-06-29 – 2016-06-30 (×3): 15 mL via OROMUCOSAL

## 2016-06-29 MED ORDER — ASPIRIN EC 81 MG PO TBEC
81.0000 mg | DELAYED_RELEASE_TABLET | Freq: Every day | ORAL | Status: DC
  Administered 2016-06-30 – 2016-07-01 (×2): 81 mg via ORAL
  Filled 2016-06-29 (×2): qty 1

## 2016-06-29 MED ORDER — SODIUM CHLORIDE 0.9 % IV SOLN
INTRAVENOUS | Status: DC
  Administered 2016-06-29 – 2016-06-30 (×4): via INTRAVENOUS

## 2016-06-29 MED ORDER — ONDANSETRON HCL 4 MG/2ML IJ SOLN: 4.0000 mg | Freq: Four times a day (QID) | INTRAMUSCULAR | Status: DC | PRN

## 2016-06-29 MED ORDER — SODIUM CHLORIDE 0.9 % IV BOLUS (SEPSIS)
500.0000 mL | Freq: Once | INTRAVENOUS | Status: AC
  Administered 2016-06-29: 500 mL via INTRAVENOUS

## 2016-06-29 MED ORDER — SODIUM CHLORIDE 0.9 % IV SOLN
500.0000 mg | Freq: Two times a day (BID) | INTRAVENOUS | Status: DC
  Filled 2016-06-29 (×2): qty 5

## 2016-06-29 MED ORDER — LEVETIRACETAM IN NACL 500 MG/100ML IV SOLN
500.0000 mg | Freq: Two times a day (BID) | INTRAVENOUS | Status: DC
  Administered 2016-06-29 – 2016-06-30 (×2): 500 mg via INTRAVENOUS
  Filled 2016-06-29 (×7): qty 100

## 2016-06-29 MED ORDER — GADOBENATE DIMEGLUMINE 529 MG/ML IV SOLN
10.0000 mL | Freq: Once | INTRAVENOUS | Status: AC | PRN
  Administered 2016-06-29: 9 mL via INTRAVENOUS

## 2016-06-29 MED ORDER — INSULIN ASPART 100 UNIT/ML ~~LOC~~ SOLN
0.0000 [IU] | SUBCUTANEOUS | Status: DC
  Administered 2016-06-29 (×2): 1 [IU] via SUBCUTANEOUS
  Administered 2016-06-30: 3 [IU] via SUBCUTANEOUS
  Administered 2016-06-30: 5 [IU] via SUBCUTANEOUS
  Administered 2016-07-01: 2 [IU] via SUBCUTANEOUS

## 2016-06-29 MED ORDER — SODIUM CHLORIDE 0.9 % IV SOLN
1000.0000 mg | Freq: Two times a day (BID) | INTRAVENOUS | Status: DC
  Filled 2016-06-29: qty 10

## 2016-06-29 MED ORDER — LEVETIRACETAM IN NACL 500 MG/100ML IV SOLN
INTRAVENOUS | Status: AC
  Filled 2016-06-29: qty 100

## 2016-06-29 MED ORDER — ONDANSETRON HCL 4 MG PO TABS: 4.0000 mg | ORAL_TABLET | Freq: Four times a day (QID) | ORAL | Status: DC | PRN

## 2016-06-29 MED ORDER — CHLORHEXIDINE GLUCONATE 0.12 % MT SOLN
15.0000 mL | Freq: Two times a day (BID) | OROMUCOSAL | Status: DC
  Administered 2016-06-29 – 2016-07-01 (×4): 15 mL via OROMUCOSAL
  Filled 2016-06-29 (×2): qty 15

## 2016-06-29 NOTE — Progress Notes (Signed)
Spoke with Dillard Cannonhristy Welborne, her daughter, HCP POA, and updated her about her father.  She gave more information to me, that her dad has advanced dementia, and cannot give any meaningful information and doesn't converse meaningfully.  He has had these "spells" for many years, and it was thought of as syncope.  She also told me that he has been a DNR. I told her clinically it is suspicious for seizure, and that his MRI did not show any acute CVA. Plan would be to continue with IV Keppra, observe for any further seizure (to manage ACD), and if he is seizure free, we will discharge him back to his SNF.   Houston SirenPeter Qianna Clagett MD FACP. Hospital Medicine.

## 2016-06-29 NOTE — Progress Notes (Signed)
EEG completed, results pending. 

## 2016-06-29 NOTE — ED Notes (Signed)
Cancel Code Stroke as per Dr. Devoria AlbeIva [name]

## 2016-06-29 NOTE — Progress Notes (Signed)
Follow up from admission earlier today: 82 yo male with hx of CVA, dementia, CKD, DM,SNF, had AMS, slurred speech, and lethargy. He was admitted with the thought that he may have had a seizure, and his Keppra was given IV along with IV Benzo. Work up showed no source of infection, including negative UA and CXR. MRI and neurology consultation are pending. This morning, he is alert, answered simple questions only. Will continue with current plan.  He is a DNR.  Thanks,  Houston SirenPeter Khaniya Tenaglia MD FACP. Hospitalist.

## 2016-06-29 NOTE — Clinical Social Work Note (Signed)
Clinical Social Work Assessment  Patient Details  Name: Todd [name] MRN: [id number] Date of Birth: 09/04/1932  Date of referral:  06/29/16               Reason for consult:  [facility]                Permission sought to share information with:    Permission granted to share information::     Name::        Agency::     Relationship::     Contact Information:  Daughter, [name], who is listed on chart.   Housing/Transportation Living arrangements for the past 2 months:  Skilled Nursing Facility Source of Information:  Patient Patient Interpreter Needed:  None Criminal Activity/Legal Involvement Pertinent to Current Situation/Hospitalization:  No - Comment as needed Significant Relationships:  Adult Children Lives with:  Facility Resident Do you feel safe going back to the place where you live?  Yes Need for family participation in patient care:  Yes (Comment)  Care giving concerns:  None identified.    Social Worker assessment / plan: [facility] Patient was placed 2-3 years ago as a difficult to place resident. His daughter lives in [place] but is his next of kin and [place]. Patient ambulates with a rollator and staff assist him with ADLs.  Keri advised that patient would only need a new FL2 if therapies were ordered. CSW spoke with patient's daughter, [name]. Ms. Lenard GallowayWelborne confirmed patient's statements and advised that patient could return to the facility.   Employment status:  Retired Database administratornsurance information:  Managed [place], [place] PT Recommendations:  Not assessed at this time Information / Referral to community resources:     Patient/Family's Response to care:  Daughter is agreeable for patient to return to facility.   Patient/Family's Understanding of and Emotional Response to Diagnosis, Current Treatment, and Prognosis: CSW placed a sticky note in Epic requesting attending to contact patient's daughter for status  update.   Emotional Assessment Appearance:  Appears stated age Attitude/Demeanor/Rapport:  Unable to Assess Affect (typically observed):  Unable to Assess Orientation:    Alcohol / Substance use:  Not Applicable Psych involvement (Current and /or in the community):  No (Comment)  Discharge Needs  Concerns to be addressed:  Discharge Planning Con[facility] Readmission within the last 30 days:  No Current discharge risk:  None Barriers to Discharge:  No Barriers Identified   Annice NeedySettle, Jagjit Riner D, LCSW 06/29/2016, 1:11 PM

## 2016-06-29 NOTE — Progress Notes (Signed)
06/29/16  1855  Lab has come three times to draw labs without success. Per lab they will send another person after shift change to draw labs.

## 2016-06-29 NOTE — Care Management Note (Signed)
Case Management Note  Patient Details  Name: Todd [name] MRN: [id number] Date of Birth: 1932/10/29  Subjective/Objective:                  Pt [facility] with seizures. [facility] Anticipate he will return to SNF at [place]. CSW is aware and will make arrangements for return to facility.  Action/Plan: No CM needs anticipated.   Expected Discharge Date:  07/01/16               Expected Discharge Plan:  Skilled Nursing Facility  In-House Referral:  Clinical Social Work  Discharge planning Services  CM Consult  Post Acute Care Choice:  NA Choice offered to:  NA  DME Arranged:    DME Agency:     HH Arranged:    HH Agency:     Status of Service:  Completed, signed off  If discussed at [employer] [employer] Meetings, dates discussed:    Additional Comments:  Malcolm MetroChildress, Chassie Pennix Demske, RN 06/29/2016, 7:58 AM

## 2016-06-29 NOTE — Procedures (Signed)
  [facility] Aahan Marques A. Gerilyn Pilgrimoonquah, MD     [email or web address]           HISTORY: The patient is a 82 year old male who presents with altered mental status and convulsion suspicious for epileptic seizures.  MEDICATIONS: Scheduled Meds: . aspirin EC  81 mg Oral Daily  . chlorhexidine  15 mL Mouth Rinse BID  . insulin aspart  0-9 Units Subcutaneous Q4H  . levETIRAcetam  500 mg Intravenous Q12H  . mouth rinse  15 mL Mouth Rinse q12n4p   Continuous Infusions: . sodium chloride 75 mL/hr at 06/29/16 1814   PRN Meds:.ondansetron **OR** ondansetron (ZOFRAN) IV  Prior to Admission medications   Medication Sig Start Date End Date Taking? Authorizing Provider  amLODipine (NORVASC) 10 MG tablet Take 10 mg by mouth daily.   Yes Historical Provider, MD  aspirin EC 81 MG tablet Take 81 mg by mouth daily.   Yes Historical Provider, MD  Cholecalciferol (VITAMIN D-3) 1000 UNITS CAPS Take 1,000 Units by mouth daily.   Yes Historical Provider, MD  divalproex (DEPAKOTE) 125 MG DR tablet 2 by mouth every 12 hours   Yes Historical Provider, MD  folic acid (FOLVITE) 1 MG tablet Take 1 mg by mouth daily.   Yes Historical Provider, MD  hydrochlorothiazide (MICROZIDE) 12.5 MG capsule Take 12.5 mg by mouth daily.   Yes Historical Provider, MD  insulin glargine (LANTUS) 100 UNIT/ML injection Inject 15 Units into the skin at bedtime.    Yes Historical Provider, MD  insulin lispro (HUMALOG) 100 UNIT/ML injection Inject 10 Units into the skin 2 (two) times daily with a meal.   Yes Historical Provider, MD  levothyroxine (SYNTHROID, LEVOTHROID) 25 MCG tablet Take 25 mcg by mouth daily before breakfast.   Yes Historical Provider, MD  lisinopril (PRINIVIL,ZESTRIL) 40 MG tablet Take 40 mg by mouth daily.   Yes Historical Provider, MD  potassium chloride (KLOR-CON) 20 MEQ packet Take 20 mEq by mouth daily.    Yes Historical Provider, MD      ANALYSIS: A 16 channel recording using standard 10 20  measurements is conducted for 21 minutes. There is a low-voltage high-frequency background activity of 20 Hz. There is also beta activity observing the frontal areas. Awake and drowsy activities are observed. There is significant myogenic artifact seen throughout the recording. Photic simulation and hypoventilation are not conducted. The patient does have head movements and tremor without electrographic correlates. There is no focal or lateral slowing. There is no epileptiform activities observed.   IMPRESSION: This is unremarkable recording awake and drowsy states.      Talan Gildner A. Gerilyn Pilgrimoonquah, M.D.  Diplomate, Biomedical engineerAmerican Board of Psychiatry and Neurology ( Neurology).

## 2016-06-29 NOTE — Consult Note (Signed)
[facility]  A. , MD     [email or web address]          Todd [name] is an 82 y.o. male.   ASSESSMENT/PLAN: 1. Single episode of convulsion. The patient does not meet criteria for epilepsy for many have a long-term increased risk given his advanced cortical dementia.  2. 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  3. Mild parkinsonism.    RECOMMENDATION: I think we should discontinue the Keppra and increased dose of Depakote which he will was previously been prescribed. I presume this is being prescribed for behavioral management. I will increase the dose to 500 twice a day.  Consider trial of Sinemet to treat the patient's parkinsonism.  Dementia labs.  The patient is a 82-year-old white male who had an episode of convulsions thought to be due to seizures. The patient has baseline cognitive impairment and history is unreliable. It appears only had one episode of convulsion. He seems to be at baseline been pleasantly confused.    GENERAL: Pleasant in no acute distress.  HEENT: Supple. Atraumatic normocephalic.   ABDOMEN: soft  EXTREMITIES: No edema   BACK: Normal.  SKIN: Normal by inspection.    MENTAL STATUS: He is awake and alert. He is oriented to name only. He does follow commands. He has mild to moderate psychomotor slowing. Speech is mildly dysarthric.  CRANIAL NERVES: Pupils are equal, round and reactive to light; extra ocular movements are full, there is no significant nystagmus; visual fields are full; upper and lower facial muscles are normal in strength and symmetric, there is no flattening of the nasolabial folds; tongue is midline.  MOTOR: The patient has antigravity strength in the upper and lower extremities. There is increased tone throughout suggestive of mild rigidity and cogwheeling is also noted especially the left side. These are all mild however.  COORDINATION: The patient is noted to have infrequent rest tremor  involving the hands bilaterally. No dysmetria is observed.  REFLEXES: Deep tendon reflexes are symmetrical and normal.   SENSATION: Normal to light touch.   Blood pressure (!) 152/86, pulse 61, temperature 97.1 F (36.2 C), temperature source Axillary, resp. rate 14, height 5' 7" (1.702 m), weight 199 lb 1.2 oz (90.3 kg), SpO2 100 %.  Past Medical History:  Diagnosis Date  . Arthritis    "probably"  . Chronic kidney disease    "? stage" (08/18/2014)  . Dementia    "don't know stage or type" (08/18/2014)  . Elevated cholesterol   . Hypertension   . Type II diabetes mellitus (HCC)     Past Surgical History:  Procedure Laterality Date  . CATARACT EXTRACTION    . CORONARY ARTERY BYPASS GRAFT  ?1994   "CABG X3"  . TONSILLECTOMY      No family history on file.  Social History:  reports that he has quit smoking. His smoking use included Pipe. He has never used smokeless tobacco. He reports that he does not drink alcohol or use drugs.  Allergies: No Known Allergies  Medications: Prior to Admission medications   Medication Sig Start Date End Date Taking? Authorizing Provider  amLODipine (NORVASC) 10 MG tablet Take 10 mg by mouth daily.   Yes Historical Provider, MD  aspirin EC 81 MG tablet Take 81 mg by mouth daily.   Yes Historical Provider, MD  Cholecalciferol (VITAMIN D-3) 1000 UNITS CAPS Take 1,000 Units by mouth daily.   Yes Historical Provider, MD  divalproex (DEPAKOTE) 125 MG DR tablet 2 by mouth every   12 hours   Yes Historical Provider, MD  folic acid (FOLVITE) 1 MG tablet Take 1 mg by mouth daily.   Yes Historical Provider, MD  hydrochlorothiazide (MICROZIDE) 12.5 MG capsule Take 12.5 mg by mouth daily.   Yes Historical Provider, MD  insulin glargine (LANTUS) 100 UNIT/ML injection Inject 15 Units into the skin at bedtime.    Yes Historical Provider, MD  insulin lispro (HUMALOG) 100 UNIT/ML injection Inject 10 Units into the skin 2 (two) times daily with a meal.   Yes  Historical Provider, MD  levothyroxine (SYNTHROID, LEVOTHROID) 25 MCG tablet Take 25 mcg by mouth daily before breakfast.   Yes Historical Provider, MD  lisinopril (PRINIVIL,ZESTRIL) 40 MG tablet Take 40 mg by mouth daily.   Yes Historical Provider, MD  potassium chloride (KLOR-CON) 20 MEQ packet Take 20 mEq by mouth daily.    Yes Historical Provider, MD    Scheduled Meds: . aspirin EC  81 mg Oral Daily  . chlorhexidine  15 mL Mouth Rinse BID  . insulin aspart  0-9 Units Subcutaneous Q4H  . levETIRAcetam  500 mg Intravenous Q12H  . mouth rinse  15 mL Mouth Rinse q12n4p   Continuous Infusions: . sodium chloride 75 mL/hr at 06/29/16 1814   PRN Meds:.ondansetron **OR** ondansetron (ZOFRAN) IV     Results for orders placed or performed during the hospital encounter of 06/28/16 (from the past 48 hour(s))  TSH     Status: Abnormal   Collection Time: 06/28/16 11:00 PM  Result Value Ref Range   TSH 12.424 (H) 0.350 - 4.500 uIU/mL  Ethanol     Status: Abnormal   Collection Time: 06/28/16 11:05 PM  Result Value Ref Range   Alcohol, Ethyl (B) 9 (H) <5 mg/dL    Comment:        LOWEST DETECTABLE LIMIT FOR SERUM ALCOHOL IS 5 mg/dL FOR MEDICAL PURPOSES ONLY   Protime-INR     Status: None   Collection Time: 06/28/16 11:05 PM  Result Value Ref Range   Prothrombin Time 13.3 11.4 - 15.2 seconds   INR 1.01   APTT     Status: None   Collection Time: 06/28/16 11:05 PM  Result Value Ref Range   aPTT 25 24 - 36 seconds  CBC     Status: Abnormal   Collection Time: 06/28/16 11:05 PM  Result Value Ref Range   WBC 9.9 4.0 - 10.5 K/uL   RBC 3.71 (L) 4.22 - 5.81 MIL/uL   Hemoglobin 12.0 (L) 13.0 - 17.0 g/dL   HCT 35.6 (L) 39.0 - 52.0 %   MCV 96.0 78.0 - 100.0 fL   MCH 32.3 26.0 - 34.0 pg   MCHC 33.7 30.0 - 36.0 g/dL   RDW 13.0 11.5 - 15.5 %   Platelets 195 150 - 400 K/uL  Differential     Status: Abnormal   Collection Time: 06/28/16 11:05 PM  Result Value Ref Range   Neutrophils Relative  % 56 %   Neutro Abs 5.6 1.7 - 7.7 K/uL   Lymphocytes Relative 30 %   Lymphs Abs 3.0 0.7 - 4.0 K/uL   Monocytes Relative 12 %   Monocytes Absolute 1.2 (H) 0.1 - 1.0 K/uL   Eosinophils Relative 2 %   Eosinophils Absolute 0.2 0.0 - 0.7 K/uL   Basophils Relative 0 %   Basophils Absolute 0.0 0.0 - 0.1 K/uL  Comprehensive metabolic panel     Status: Abnormal   Collection Time: 06/28/16 11:05 PM  Result   Value Ref Range   Sodium 139 135 - 145 mmol/L   Potassium 4.2 3.5 - 5.1 mmol/L   Chloride 102 101 - 111 mmol/L   CO2 25 22 - 32 mmol/L   Glucose, Bld 117 (H) 65 - 99 mg/dL   BUN 38 (H) 6 - 20 mg/dL   Creatinine, Ser 1.96 (H) 0.61 - 1.24 mg/dL   Calcium 8.9 8.9 - 10.3 mg/dL   Total Protein 7.1 6.5 - 8.1 g/dL   Albumin 3.7 3.5 - 5.0 g/dL   AST 19 15 - 41 U/L   ALT 16 (L) 17 - 63 U/L   Alkaline Phosphatase 43 38 - 126 U/L   Total Bilirubin 0.3 0.3 - 1.2 mg/dL   GFR calc non Af Amer 30 (L) >60 mL/min   GFR calc Af Amer 35 (L) >60 mL/min    Comment: (NOTE) The eGFR has been calculated using the CKD EPI equation. This calculation has not been validated in all clinical situations. eGFR's persistently <60 mL/min signify possible Chronic Kidney Disease.    Anion gap 12 5 - 15  Urine rapid drug screen (hosp performed)not at [facility]     Status: None   Collection Time: 06/28/16 11:05 PM  Result Value Ref Range   Opiates NONE DETECTED NONE DETECTED   Cocaine NONE DETECTED NONE DETECTED   Benzodiazepines NONE DETECTED NONE DETECTED   Amphetamines NONE DETECTED NONE DETECTED   Tetrahydrocannabinol NONE DETECTED NONE DETECTED   Barbiturates NONE DETECTED NONE DETECTED    Comment:        DRUG SCREEN FOR MEDICAL PURPOSES ONLY.  IF CONFIRMATION IS NEEDED FOR ANY PURPOSE, NOTIFY LAB WITHIN 5 DAYS.        LOWEST DETECTABLE LIMITS FOR URINE DRUG SCREEN Drug Class       Cutoff (ng/mL) Amphetamine      1000 Barbiturate      200 Benzodiazepine   416 Tricyclics       384 Opiates           300 Cocaine          300 THC              50   Urinalysis, Routine w reflex microscopic (not at [facility])     Status: Abnormal   Collection Time: 06/28/16 11:05 PM  Result Value Ref Range   Color, Urine YELLOW YELLOW   APPearance CLEAR CLEAR   Specific Gravity, Urine >1.030 (H) 1.005 - 1.030   pH 5.5 5.0 - 8.0   Glucose, UA NEGATIVE NEGATIVE mg/dL   Hgb urine dipstick NEGATIVE NEGATIVE   Bilirubin Urine SMALL (A) NEGATIVE   Ketones, ur 15 (A) NEGATIVE mg/dL   Protein, ur TRACE (A) NEGATIVE mg/dL   Nitrite NEGATIVE NEGATIVE   Leukocytes, UA NEGATIVE NEGATIVE  Valproic acid level     Status: Abnormal   Collection Time: 06/28/16 11:05 PM  Result Value Ref Range   Valproic Acid Lvl 37 (L) 50.0 - 100.0 ug/mL  Urine microscopic-add on     Status: Abnormal   Collection Time: 06/28/16 11:05 PM  Result Value Ref Range   Squamous Epithelial / LPF 0-5 (A) NONE SEEN   WBC, UA 0-5 0 - 5 WBC/hpf   RBC / HPF 0-5 0 - 5 RBC/hpf   Bacteria, UA RARE (A) NONE SEEN  I-stat troponin, ED (not at [facility], [facility])     Status: None   Collection Time: 06/28/16 11:24 PM  Result Value Ref Range   Troponin  i, poc 0.00 0.00 - 0.08 ng/mL   Comment 3            Comment: Due to the release kinetics of cTnI, a negative result within the first hours of the onset of symptoms does not rule out myocardial infarction with certainty. If myocardial infarction is still suspected, repeat the test at appropriate intervals.   Troponin I     Status: None   Collection Time: 06/29/16  2:47 AM  Result Value Ref Range   Troponin I <0.03 <0.03 ng/mL  Glucose, capillary     Status: Abnormal   Collection Time: 06/29/16  4:54 AM  Result Value Ref Range   Glucose-Capillary 112 (H) 65 - 99 mg/dL  Glucose, capillary     Status: Abnormal   Collection Time: 06/29/16  7:53 AM  Result Value Ref Range   Glucose-Capillary 119 (H) 65 - 99 mg/dL  Troponin I     Status: None   Collection Time: 06/29/16  8:14 AM  Result Value Ref Range    Troponin I <0.03 <0.03 ng/mL  Glucose, capillary     Status: Abnormal   Collection Time: 06/29/16 11:47 AM  Result Value Ref Range   Glucose-Capillary 146 (H) 65 - 99 mg/dL  Glucose, capillary     Status: Abnormal   Collection Time: 06/29/16  4:12 PM  Result Value Ref Range   Glucose-Capillary 114 (H) 65 - 99 mg/dL  MRSA PCR Screening     Status: None   Collection Time: 06/29/16  5:47 PM  Result Value Ref Range   MRSA by PCR NEGATIVE NEGATIVE    Comment:        The GeneXpert MRSA Assay (FDA approved for NASAL specimens only), is one component of a comprehensive MRSA colonization surveillance program. It is not intended to diagnose MRSA infection nor to guide or monitor treatment for MRSA infections.     Studies/Results:  BRAIN MRI FINDINGS: Negative for acute infarct.  Moderate atrophy. Mild chronic white matter changes. Small chronic infarct right paramedian pons. Normal cerebellum.  Mild amount of hemosiderin staining along the cortex of the right frontal parietal lobe likely due to chronic mild subarachnoid hemorrhage. No acute hemorrhage is seen on yesterday's CT. No fluid collection or mass-effect  Negative for mass or edema.  Postcontrast imaging demonstrates normal enhancement. Small dural base calcification high right frontal lobe does not enhance and may be dural calcification or small calcified meningioma. Postcontrast imaging is degraded by motion.  Extensive mucosal edema in the paranasal sinuses with complete opacification of the left sphenoid sinus and extensive mucosal edema in the left ethmoid sinus. Mucosal edema in the maxillary sinuses left greater than right. This is chronic and unchanged from 2015.  No orbital lesion identified.  Pituitary not enlarged.  IMPRESSION: Negative for acute infarct.  Atrophy and mild chronic ischemic change  Chronic sinusitis          HEAD CT Cortical gray-white differentiation is  preserved. The basal ganglia and insular cortices are normal.  No extra-axial collection, subarachnoid hemorrhage or intraparenchymal hematoma. No mass lesion, midline shift or hydrocephalus.B there is a small dural-based calcification along the right frontal convexity, possibly a small meningioma.  Complete opacification of the left sphenoid sinus and posterior ethmoid air cells, unchanged. Mastoids are clear. Normal orbits.  ASPECTS (Alberta Stroke Program Early CT Score)  - Ganglionic level infarction (caudate, lentiform nuclei, internal capsule, insula, M1-M3 cortex): 7  - Supraganglionic infarction (M4-M6 cortex): 3  Total score (0-10   with 10 being normal): 10  IMPRESSION: 1. No acute intracranial hemorrhage. 2. ASPECTS is 10.  No CT evidence of acute cortical infarct. 3. Small dural base calcification along the right frontal convexity, possibly a small meningioma.               Brynna Dobos A. Merlene Laughter, M.D.  Diplomate, Tax adviser of Psychiatry and Neurology ( Neurology). 06/29/2016, 7:30 PM

## 2016-06-29 NOTE — H&P (Signed)
History and Physical    Malon Branton VQQ:[id number] DOB: 02/22/1932 DOA: 06/28/2016  PCP: Marletta Lor, NP  Patient coming from: SNF  Chief Complaint:   confused  HPI: Keil Pickering is a 82 y.o. male with medical history significant of CVA, Dementia, CKD, dm, HTN lives in snf slumped over at SNF and had some slurred speech sent to ED.  Thought to be a code stroke.  Tele neuro was evaluating pt and noted some twitching to his leg and thought he was having a seizure.  Pt is on valproate acid at SNF orally for unknown reason.  Seizures is not in his chart but "unresponsive state" is.  Pt was given keppra and several rounds of ativan in the ED and he is now sedated.  Pt referred for admission for seizure work up.  Pt cannot provide history.  His DNR yellow form is with his from SNF.   Review of Systems: unobtainable due to dementia  Past Medical History:  Diagnosis Date  . Arthritis    "probably"  . Chronic kidney disease    "? stage" (08/18/2014)  . Dementia    "don't know stage or type" (08/18/2014)  . Elevated cholesterol   . Hypertension   . Type II diabetes mellitus (HCC)     Past Surgical History:  Procedure Laterality Date  . CATARACT EXTRACTION    . CORONARY ARTERY BYPASS GRAFT  ?1994   "CABG X3"  . TONSILLECTOMY       reports that he has quit smoking. His smoking use included Pipe. He has never used smokeless tobacco. He reports that he does not drink alcohol or use drugs.  No Known Allergies  Fhx, unknown due to dementia  Prior to Admission medications   Medication Sig Start Date End Date Taking? Authorizing Provider  amLODipine (NORVASC) 10 MG tablet Take 10 mg by mouth daily.    Historical Provider, MD  aspirin EC 81 MG tablet Take 81 mg by mouth daily.    Historical Provider, MD  Cholecalciferol (VITAMIN D-3) 1000 UNITS CAPS Take 1,000 Units by mouth daily.    Historical Provider, MD  divalproex (DEPAKOTE) 125 MG DR tablet 2 by mouth every 12 hours     Historical Provider, MD  folic acid (FOLVITE) 1 MG tablet Take 1 mg by mouth daily.    Historical Provider, MD  hydrochlorothiazide (MICROZIDE) 12.5 MG capsule Take 12.5 mg by mouth daily.    Historical Provider, MD  insulin aspart (NOVOLOG) 100 UNIT/ML injection 10 units pre evening meal, give with lunch if CBG is greater than 150    Historical Provider, MD  insulin glargine (LANTUS) 100 UNIT/ML injection Inject 15 Units into the skin at bedtime.     Historical Provider, MD  levothyroxine (SYNTHROID, LEVOTHROID) 25 MCG tablet Take 25 mcg by mouth daily before breakfast.    Historical Provider, MD  lisinopril (PRINIVIL,ZESTRIL) 40 MG tablet Take 40 mg by mouth daily.    Historical Provider, MD  potassium chloride (KLOR-CON) 20 MEQ packet Take 20 mEq by mouth daily.     Historical Provider, MD    Physical Exam: Vitals:   06/29/16 0112 06/29/16 0115 06/29/16 0116 06/29/16 0204  BP:  (!) 108/53    Pulse: 70 71    Resp: 16 19    Temp:    97.5 F (36.4 C)  TempSrc:    Axillary  SpO2: 97% 96%    Weight:   89.8 kg (198 lb) 90.3 kg (199 lb 1.2 oz)  Height:   5\' 7"  (1.702 m) 5\' 7"  (1.702 m)      Constitutional: NAD, calm, comfortable Vitals:   06/29/16 0112 06/29/16 0115 06/29/16 0116 06/29/16 0204  BP:  (!) 108/53    Pulse: 70 71    Resp: 16 19    Temp:    97.5 F (36.4 C)  TempSrc:    Axillary  SpO2: 97% 96%    Weight:   89.8 kg (198 lb) 90.3 kg (199 lb 1.2 oz)  Height:   5\' 7"  (1.702 m) 5\' 7"  (1.702 m)   Eyes: PERRL, lids and conjunctivae normal ENMT: Mucous membranes are dry. Posterior pharynx clear of any exudate or lesions.Normal dentition.  Neck: normal, supple, no masses, no thyromegaly Respiratory: clear to auscultation bilaterally, no wheezing, no crackles. Normal respiratory effort. No accessory muscle use.  Cardiovascular: Regular rate and rhythm, no murmurs / rubs / gallops. No extremity edema. 2+ pedal pulses. No carotid bruits.  Abdomen: no tenderness, no masses  palpated. No hepatosplenomegaly. Bowel sounds positive.  Musculoskeletal: no clubbing / cyanosis. No joint deformity upper and lower extremities. Good ROM, no contractures. Normal muscle tone.  Skin: no rashes, lesions, ulcers. No induration Neurologic: sedated  Psychiatric: sedated   Labs on Admission: I have personally reviewed following labs and imaging studies  CBC:  Recent Labs Lab 06/28/16 2305  WBC 9.9  NEUTROABS 5.6  HGB 12.0*  HCT 35.6*  MCV 96.0  PLT 195   Basic Metabolic Panel:  Recent Labs Lab 06/28/16 2305  NA 139  K 4.2  CL 102  CO2 25  GLUCOSE 117*  BUN 38*  CREATININE 1.96*  CALCIUM 8.9   GFR: Estimated Creatinine Clearance: 31.2 mL/min (by C-G formula based on SCr of 1.96 mg/dL). Liver Function Tests:  Recent Labs Lab 06/28/16 2305  AST 19  ALT 16*  ALKPHOS 43  BILITOT 0.3  PROT 7.1  ALBUMIN 3.7   Coagulation Profile:  Recent Labs Lab 06/28/16 2305  INR 1.01   Urine analysis:    Component Value Date/Time   COLORURINE YELLOW 06/28/2016 2305   APPEARANCEUR CLEAR 06/28/2016 2305   LABSPEC >1.030 (H) 06/28/2016 2305   PHURINE 5.5 06/28/2016 2305   GLUCOSEU NEGATIVE 06/28/2016 2305   HGBUR NEGATIVE 06/28/2016 2305   BILIRUBINUR SMALL (A) 06/28/2016 2305   KETONESUR 15 (A) 06/28/2016 2305   PROTEINUR TRACE (A) 06/28/2016 2305   UROBILINOGEN 0.2 09/01/2015 1835   NITRITE NEGATIVE 06/28/2016 2305   LEUKOCYTESUR NEGATIVE 06/28/2016 2305   Radiological Exams on Admission: Ct Head Code Stroke Wo Contrast`  Addendum Date: 06/29/2016   ADDENDUM REPORT: 06/29/2016 00:50 ADDENDUM: These results were called by telephone at the time of interpretation on 06/28/2016 at 11:35 p.m. to Dr. Devoria AlbeIva Knapp , who verbally acknowledged these results. Electronically Signed   By: Deatra RobinsonKevin  Herman M.D.   On: 06/29/2016 00:50   Result Date: 06/29/2016 CLINICAL DATA:  Code stroke.  Sudden onset left-sided weakness EXAM: CT HEAD WITHOUT CONTRAST TECHNIQUE:  Contiguous axial images were obtained from the base of the skull through the vertex without intravenous contrast. COMPARISON:  Head CT 10/02/2015 FINDINGS: Cortical gray-white differentiation is preserved. The basal ganglia and insular cortices are normal. No extra-axial collection, subarachnoid hemorrhage or intraparenchymal hematoma. No mass lesion, midline shift or hydrocephalus.B there is a small dural-based calcification along the right frontal convexity, possibly a small meningioma. Complete opacification of the left sphenoid sinus and posterior ethmoid air cells, unchanged. Mastoids are clear. Normal orbits. ASPECTS [facility](Alberta Stroke  Program Early CT Score) - Ganglionic level infarction (caudate, lentiform nuclei, internal capsule, insula, M1-M3 cortex): 7 - Supraganglionic infarction (M4-M6 cortex): 3 Total score (0-10 with 10 being normal): 10 IMPRESSION: 1. No acute intracranial hemorrhage. 2. ASPECTS is 10.  No CT evidence of acute cortical infarct. 3. Small dural base calcification along the right frontal convexity, possibly a small meningioma. Electronically Signed: By: Deatra Robinson M.D. On: 06/28/2016 23:38    EKG: Independently reviewed. nsr Old chart reviewed cxr reviewed no edema or infiltrate Case discussed with dr Lynelle Doctor and ICU RN  Assessment/Plan Principal Problem: 82 yo male with h/o CVA and dementia thought to have a seizure    Seizure (HCC)- unknown if he has this diagnosis already.  Obtain MRI in am of his brain.  Place on kepppra 500 mg iv q 12 hours.  Obtain records from SNF.  Obtain neuro consult in the am.  No source of infection, ua and cxr neg.  Active Problems:   Diabetes mellitus type 2, uncontrolled, with complications (HCC)- ssi   S/P CABG (coronary artery bypass graft)- noted   CKD (chronic kidney disease)- with AKI, dehydration, ivf   Dementia   CVA (cerebral infarction) history   AKI (acute kidney injury) (HCC)   Admit to stepdown.  DVT prophylaxis:   scd Code Status:  DNR   Edee Nifong A MD [facility]  If 7PM-7AM, please contact night-coverage [email or web address] Password [email or web address]  06/29/2016, 2:07 AM

## 2016-06-29 NOTE — Progress Notes (Signed)
Beeper   1057pm In CT   1110 Out of CT  1115  [facility]   [facility]

## 2016-06-29 NOTE — ED Notes (Signed)
Activated Code Stroke 

## 2016-06-29 NOTE — Care Management Important Message (Signed)
Important Message  Patient Details  Name: Todd FendtMilton [name] MRN: [id number] Date of Birth: 08-16-1932   Medicare Important Message Given:  Yes    Malcolm MetroChildress, Tatem Holsonback Demske, RN 06/29/2016, 7:58 AM

## 2016-06-30 DIAGNOSIS — N189 Chronic kidney disease, unspecified: Secondary | ICD-10-CM

## 2016-06-30 DIAGNOSIS — R569 Unspecified convulsions: Principal | ICD-10-CM

## 2016-06-30 DIAGNOSIS — F0390 Unspecified dementia without behavioral disturbance: Secondary | ICD-10-CM

## 2016-06-30 LAB — GLUCOSE, CAPILLARY
GLUCOSE-CAPILLARY: 127 mg/dL — AB (ref 65–99)
GLUCOSE-CAPILLARY: 164 mg/dL — AB (ref 65–99)
GLUCOSE-CAPILLARY: 258 mg/dL — AB (ref 65–99)
GLUCOSE-CAPILLARY: 283 mg/dL — AB (ref 65–99)
Glucose-Capillary: 104 mg/dL — ABNORMAL HIGH (ref 65–99)
Glucose-Capillary: 115 mg/dL — ABNORMAL HIGH (ref 65–99)
Glucose-Capillary: 117 mg/dL — ABNORMAL HIGH (ref 65–99)

## 2016-06-30 LAB — VITAMIN B12: Vitamin B-12: 611 pg/mL (ref 180–914)

## 2016-06-30 MED ORDER — DIVALPROEX SODIUM 250 MG PO DR TAB
500.0000 mg | DELAYED_RELEASE_TABLET | Freq: Two times a day (BID) | ORAL | Status: DC
  Administered 2016-06-30 – 2016-07-01 (×4): 500 mg via ORAL
  Filled 2016-06-30 (×4): qty 2

## 2016-06-30 MED ORDER — CARBIDOPA-LEVODOPA 25-100 MG PO TABS
1.0000 | ORAL_TABLET | Freq: Two times a day (BID) | ORAL | Status: DC
  Administered 2016-06-30 – 2016-07-01 (×2): 1 via ORAL
  Filled 2016-06-30 (×2): qty 1

## 2016-06-30 NOTE — Progress Notes (Signed)
Called report to Dois DavenportSandra, RN on dept 300. Verbalized understanding. Pt transferred to room 336 in safe and stable condition.

## 2016-06-30 NOTE — Progress Notes (Signed)
Safety sitter present.  Pt is confused and impulsive.  So far he has been cooperative but when he is awake he is trying to walk out.

## 2016-06-30 NOTE — Progress Notes (Signed)
PROGRESS NOTE    Todd [name]  ONG:[id number] DOB: 07/29/1932 DOA: 06/28/2016 PCP: Marletta LorBarr, Julie, NP    Brief Narrative: 82 yo male with hx of CVA, dementia, CKD, DM,SNF, had AMS, slurred speech, and lethargy. He was admitted with the thought that he may have had a seizure, and his Keppra was given IV along with IV Benzo. Work up showed no source of infection, including negative UA and CXR. MRI of the head showed no acute CVA. Neurology saw him and didn't think it was seizure.  Dr Jerre Simonooquah recommended d/c Keppa and increase Valproic acid. This morning, he is alert, answered simple questions only. Will continue with current plan.  He is a DNR.  EEG did not show epileptic activities.    Assessment & Plan:   Principal Problem:   Seizure (HCC) Active Problems:   Diabetes mellitus type 2, uncontrolled, with complications (HCC)   S/P CABG (coronary artery bypass graft)   CKD (chronic kidney disease)   Dementia   CVA (cerebral infarction) history   Type II diabetes mellitus (HCC)   AKI (acute kidney injury) (HCC)   Confusion  Possible Seizure:  Will follow neurology recommendation and d/c Keppra, increase Depakote.  Discussed this with daughter.  She is OK with it.   Will watch over 24 hours, and plan to d/c back Sunday or Monday.  Dementia:  This has been work up in the the past per daughter.  Parkinsonism:  Will d/c medication causing parkinsonism.  Daughter agreed to hold off on further medications for this.  No cogwheel rigidity.  CKD: Cr 1.9.  DVT prophylaxis: SCD Code Status: DNR Family Communication: [name] Wellborn, daughter, POA HCP.  Disposition Plan: To SNF.  Consultants:   Neurology Dr Doonquah.  Procedures:   EEG  Antimicrobials: Anti-infectives    None       Subjective: He has no complaints.   Objective: Vitals:   06/30/16 0500 06/30/16 0600 06/30/16 0621 06/30/16 0731  BP:  (!) 135/101 (!) 161/77 (!) 180/76  Pulse:    69  Resp: 11 14  20  Temp:     97 F (36.1 C)  TempSrc:    Oral  SpO2:    100%  Weight:      Height:        Intake/Output Summary (Last 24 hours) at 06/30/16 0841 Last data filed at 06/30/16 0600  Gross per 24 hour  Intake             2000 ml  Output             1050 ml  Net              95 0 ml   Filed Weights   06/29/16 0204 06/29/16 0500 06/30/16 0400  Weight: 90.3 kg (199 lb 1.2 oz) 90.3 kg (199 lb 1.2 oz) 92 kg (202 lb 13.2 oz)    Examination:  General exam: Appears calm and comfortable  Respiratory system: Clear to auscultation. Respiratory effort normal. Cardiovascular system: S1 & S2 heard, RRR. No JVD, murmurs, rubs, gallops or clicks. No pedal edema. Gastrointestinal system: Abdomen is nondistended, soft and nontender. No organomegaly or masses felt. Normal bowel sounds heard. Central nervous system: Alert and oriented. No focal neurological deficits. Extremities: Symmetric 5 x 5 power. Skin: No rashes, lesions or ulcers Psychiatry: Judgement and insight appear normal. Mood & affect appropriate.   Data Reviewed: I have personally reviewed following labs and imaging studies  CBC:  Recent Labs Lab 06/28/16 2305  WBC 9.9  NEUTROABS 5.6  HGB 12.0*  HCT 35.6*  MCV 96.0  PLT 195   Basic Metabolic Panel:  Recent Labs Lab 06/28/16 2305  NA 139  K 4.2  CL 102  CO2 25  GLUCOSE 117*  BUN 38*  CREATININE 1.96*  CALCIUM 8.9   GFR: Estimated Creatinine Clearance: 31.4 mL/min (by C-G formula based on SCr of 1.96 mg/dL). Liver Function Tests:  Recent Labs Lab 06/28/16 2305  AST 19  ALT 16*  ALKPHOS 43  BILITOT 0.3  PROT 7.1  ALBUMIN 3.7   Coagulation Profile:  Recent Labs Lab 06/28/16 2305  INR 1.01   Cardiac Enzymes:  Recent Labs Lab 06/29/16 0247 06/29/16 0814 06/29/16 1943  TROPONINI <0.03 <0.03 <0.03   CBG:  Recent Labs Lab 06/29/16 1612 06/29/16 2050 06/30/16 0023 06/30/16 0434 06/30/16 0726  GLUCAP 114* 132* 117* 104* 115*   Thyroid Function  Tests:  Recent Labs  06/28/16 2300  TSH 12.424*    Recent Results (from the past 240 hour(s))  MRSA PCR Screening     Status: None   Collection Time: 06/29/16  5:47 PM  Result Value Ref Range Status   MRSA by PCR NEGATIVE NEGATIVE Final    Comment:        The GeneXpert MRSA Assay (FDA approved for NASAL specimens only), is one component of a comprehensive MRSA colonization surveillance program. It is not intended to diagnose MRSA infection nor to guide or monitor treatment for MRSA infections.      Radiology Studies: Mr Laqueta Jean JX Contrast  Result Date: 06/29/2016 CLINICAL DATA:  Confusion.  Left-sided weakness and slurred speech EXAM: MRI HEAD WITHOUT AND WITH CONTRAST TECHNIQUE: Multiplanar, multiecho pulse sequences of the brain and surrounding structures were obtained without and with intravenous contrast. CONTRAST:  9mL MULTIHANCE GADOBENATE DIMEGLUMINE 529 MG/ML IV SOLN COMPARISON:  CT head 06/28/2016.  MRI 10/04/2014 FINDINGS: Negative for acute infarct. Moderate atrophy. Mild chronic white matter changes. Small chronic infarct right paramedian pons. Normal cerebellum. Mild amount of hemosiderin staining along the cortex of the right frontal parietal lobe likely due to chronic mild subarachnoid hemorrhage. No acute hemorrhage is seen on yesterday's CT. No fluid collection or mass-effect Negative for mass or edema. Postcontrast imaging demonstrates normal enhancement. Small dural base calcification high right frontal lobe does not enhance and may be dural calcification or small calcified meningioma. Postcontrast imaging is degraded by motion. Extensive mucosal edema in the paranasal sinuses with complete opacification of the left sphenoid sinus and extensive mucosal edema in the left ethmoid sinus. Mucosal edema in the maxillary sinuses left greater than right. This is chronic and unchanged from 2015. No orbital lesion identified.  Pituitary not enlarged. IMPRESSION: Negative for  acute infarct. Atrophy and mild chronic ischemic change Chronic sinusitis Electronically Signed   By: Marlan Palau M.D.   On: 06/29/2016 10:56   Ct Head Code Stroke Wo Contrast`  Addendum Date: 06/29/2016   ADDENDUM REPORT: 06/29/2016 00:50 ADDENDUM: These results were called by telephone at the time of interpretation on 06/28/2016 at 11:35 p.m. to Dr. Devoria Albe , who verbally acknowledged these results. Electronically Signed   By: Deatra Robinson M.D.   On: 06/29/2016 00:50   Result Date: 06/29/2016 CLINICAL DATA:  Code stroke.  Sudden onset left-sided weakness EXAM: CT HEAD WITHOUT CONTRAST TECHNIQUE: Contiguous axial images were obtained from the base of the skull through the vertex without intravenous contrast. COMPARISON:  Head CT 10/02/2015 FINDINGS: Cortical gray-white differentiation is  preserved. The basal ganglia and insular cortices are normal. No extra-axial collection, subarachnoid hemorrhage or intraparenchymal hematoma. No mass lesion, midline shift or hydrocephalus.B there is a small dural-based calcification along the right frontal convexity, possibly a small meningioma. Complete opacification of the left sphenoid sinus and posterior ethmoid air cells, unchanged. Mastoids are clear. Normal orbits. ASPECTS [facility] Stroke Program Early CT Score) - Ganglionic level infarction (caudate, lentiform nuclei, internal capsule, insula, M1-M3 cortex): 7 - Supraganglionic infarction (M4-M6 cortex): 3 Total score (0-10 with 10 being normal): 10 IMPRESSION: 1. No acute intracranial hemorrhage. 2. ASPECTS is 10.  No CT evidence of acute cortical infarct. 3. Small dural base calcification along the right frontal convexity, possibly a small meningioma. Electronically Signed: By: Deatra Robinson M.D. On: 06/28/2016 23:38    Scheduled Meds: . aspirin EC  81 mg Oral Daily  . chlorhexidine  15 mL Mouth Rinse BID  . divalproex  500 mg Oral Q12H  . insulin aspart  0-9 Units Subcutaneous Q4H  . mouth rinse  15  mL Mouth Rinse q12n4p   Continuous Infusions: . sodium chloride 75 mL/hr at 06/29/16 1814     LOS: 1 day   Armeda Plumb, MD FACP Hospitalist.   If 7PM-7AM, please contact night-coverage [email or web address] Password [email or web address] 06/30/2016, 8:41 AM

## 2016-07-01 ENCOUNTER — Emergency Department (HOSPITAL_COMMUNITY): Payer: Medicare Other

## 2016-07-01 ENCOUNTER — Encounter (HOSPITAL_COMMUNITY): Payer: Self-pay

## 2016-07-01 ENCOUNTER — Inpatient Hospital Stay
Admission: RE | Admit: 2016-07-01 | Discharge: 2016-07-14 | Disposition: A | Discharge status: Other Acute Inpatient Hospital | Payer: Medicare Other | Source: Ambulatory Visit | Attending: Internal Medicine | Admitting: Internal Medicine

## 2016-07-01 ENCOUNTER — Emergency Department (HOSPITAL_COMMUNITY)
Admission: EM | Admit: 2016-07-01 | Discharge: 2016-07-01 | Disposition: A | Discharge status: Home / Self Care | Payer: Medicare Other | Attending: Emergency Medicine | Admitting: Emergency Medicine

## 2016-07-01 DIAGNOSIS — W19XXXA Unspecified fall, initial encounter: Secondary | ICD-10-CM

## 2016-07-01 DIAGNOSIS — Z951 Presence of aortocoronary bypass graft: Secondary | ICD-10-CM | POA: Insufficient documentation

## 2016-07-01 DIAGNOSIS — Y939 Activity, unspecified: Secondary | ICD-10-CM | POA: Insufficient documentation

## 2016-07-01 DIAGNOSIS — E1122 Type 2 diabetes mellitus with diabetic chronic kidney disease: Secondary | ICD-10-CM | POA: Insufficient documentation

## 2016-07-01 DIAGNOSIS — Y929 Unspecified place or not applicable: Secondary | ICD-10-CM | POA: Insufficient documentation

## 2016-07-01 DIAGNOSIS — X58XXXA Exposure to other specified factors, initial encounter: Secondary | ICD-10-CM | POA: Insufficient documentation

## 2016-07-01 DIAGNOSIS — Z7982 Long term (current) use of aspirin: Secondary | ICD-10-CM | POA: Insufficient documentation

## 2016-07-01 DIAGNOSIS — Y999 Unspecified external cause status: Secondary | ICD-10-CM | POA: Insufficient documentation

## 2016-07-01 DIAGNOSIS — N189 Chronic kidney disease, unspecified: Secondary | ICD-10-CM | POA: Insufficient documentation

## 2016-07-01 DIAGNOSIS — Z87891 Personal history of nicotine dependence: Secondary | ICD-10-CM | POA: Insufficient documentation

## 2016-07-01 DIAGNOSIS — S42202A Unspecified fracture of upper end of left humerus, initial encounter for closed fracture: Secondary | ICD-10-CM

## 2016-07-01 DIAGNOSIS — I129 Hypertensive chronic kidney disease with stage 1 through stage 4 chronic kidney disease, or unspecified chronic kidney disease: Secondary | ICD-10-CM | POA: Insufficient documentation

## 2016-07-01 DIAGNOSIS — I2510 Atherosclerotic heart disease of native coronary artery without angina pectoris: Secondary | ICD-10-CM | POA: Insufficient documentation

## 2016-07-01 DIAGNOSIS — S42212A Unspecified displaced fracture of surgical neck of left humerus, initial encounter for closed fracture: Secondary | ICD-10-CM | POA: Diagnosis not present

## 2016-07-01 DIAGNOSIS — F0390 Unspecified dementia without behavioral disturbance: Secondary | ICD-10-CM | POA: Insufficient documentation

## 2016-07-01 DIAGNOSIS — Z794 Long term (current) use of insulin: Secondary | ICD-10-CM | POA: Insufficient documentation

## 2016-07-01 LAB — COMPREHENSIVE METABOLIC PANEL
ALBUMIN: 3.3 g/dL — AB (ref 3.5–5.0)
ALK PHOS: 46 U/L (ref 38–126)
ALT: 5 U/L — AB (ref 17–63)
AST: 25 U/L (ref 15–41)
Anion gap: 9 (ref 5–15)
BUN: 22 mg/dL — ABNORMAL HIGH (ref 6–20)
CALCIUM: 8.8 mg/dL — AB (ref 8.9–10.3)
CO2: 23 mmol/L (ref 22–32)
CREATININE: 1.35 mg/dL — AB (ref 0.61–1.24)
Chloride: 104 mmol/L (ref 101–111)
GFR calc non Af Amer: 47 mL/min — ABNORMAL LOW (ref >60)
GFR, EST AFRICAN AMERICAN: 55 mL/min — AB (ref >60)
GLUCOSE: 265 mg/dL — AB (ref 65–99)
Potassium: 4.3 mmol/L (ref 3.5–5.1)
SODIUM: 136 mmol/L (ref 135–145)
Total Bilirubin: 0.7 mg/dL (ref 0.3–1.2)
Total Protein: 6.3 g/dL — ABNORMAL LOW (ref 6.5–8.1)

## 2016-07-01 LAB — CBC WITH DIFFERENTIAL/PLATELET
Basophils Absolute: 0 10*3/uL (ref 0.0–0.1)
Basophils Relative: 0 %
Eosinophils Absolute: 0.1 10*3/uL (ref 0.0–0.7)
Eosinophils Relative: 1 %
HCT: 32.5 % — ABNORMAL LOW (ref 39.0–52.0)
Hemoglobin: 11 g/dL — ABNORMAL LOW (ref 13.0–17.0)
Lymphocytes Relative: 15 %
Lymphs Abs: 1.1 10*3/uL (ref 0.7–4.0)
MCH: 32.1 pg (ref 26.0–34.0)
MCHC: 33.8 g/dL (ref 30.0–36.0)
MCV: 94.8 fL (ref 78.0–100.0)
Monocytes Absolute: 0.6 10*3/uL (ref 0.1–1.0)
Monocytes Relative: 8 %
Neutro Abs: 5.8 10*3/uL (ref 1.7–7.7)
Neutrophils Relative %: 76 %
Platelets: 183 10*3/uL (ref 150–400)
RBC: 3.43 MIL/uL — ABNORMAL LOW (ref 4.22–5.81)
RDW: 12.7 % (ref 11.5–15.5)
WBC: 7.6 10*3/uL (ref 4.0–10.5)

## 2016-07-01 LAB — GLUCOSE, CAPILLARY
Glucose-Capillary: 83 mg/dL (ref 65–99)
Glucose-Capillary: 86 mg/dL (ref 65–99)

## 2016-07-01 LAB — RPR: RPR Ser Ql: NONREACTIVE

## 2016-07-01 LAB — VALPROIC ACID LEVEL: Valproic Acid Lvl: 52 ug/mL (ref 50.0–100.0)

## 2016-07-01 MED ORDER — LORAZEPAM 2 MG/ML IJ SOLN
0.5000 mg | Freq: Once | INTRAMUSCULAR | Status: AC
  Administered 2016-07-01: 0.5 mg via INTRAVENOUS
  Filled 2016-07-01: qty 1

## 2016-07-01 MED ORDER — LEVOTHYROXINE SODIUM 25 MCG PO TABS: 50.0000 ug | ORAL_TABLET | Freq: Every day | ORAL | 1 refills | Status: AC

## 2016-07-01 MED ORDER — DIVALPROEX SODIUM 500 MG PO DR TAB: 500.0000 mg | DELAYED_RELEASE_TABLET | Freq: Two times a day (BID) | ORAL | 1 refills | Status: DC

## 2016-07-01 NOTE — Discharge Instructions (Signed)
Wear the sling and follow up with Dr. Magnus IvanBLackman. Return to the ED if you develop new or worsening symptoms.

## 2016-07-01 NOTE — ED Notes (Signed)
Meal tray given, pt's brother assisting with feeding as needed.

## 2016-07-01 NOTE — ED Triage Notes (Signed)
[facility] staff reports [facility] was [facility] and when [facility] they noticed he had swelling, bruising, and blisters to left arm.  They did an xray and found that pt has a humerus fracture.  Called 3rd floor charge RN and to her knowledge, pt did not fall during his hospital stay.

## 2016-07-01 NOTE — Progress Notes (Signed)
Patient with orders to be discharge [facility]. Report called to Renae, nurse. Patient stable. [facility] staff.

## 2016-07-01 NOTE — Clinical Social Work Note (Signed)
Per MD patient ready to [facility]. RN, patient/family (Messages left for Genevie Cheshirehristie Welbourne regarding DC requesting call back), and facility notified of patient's DC. RN given number for report. CSW signing off at this time.   Roddie McBryant Audris Speaker MSW, DandridgeLCSW, [place], [phone]

## 2016-07-01 NOTE — Discharge Summary (Signed)
Physician Discharge Summary  Todd [name] ONG:[id number] DOB: [birth year] DOA: 06/28/2016  PCP: Marletta LorBarr, Julie, NP  Admit date: 06/28/2016 Discharge date: 07/01/2016  Admitted From: SNF. Disposition:  SNF.   Recommendations for Outpatient Follow-up:  1. Follow up with PCP in 1-2 weeks 2. Please obtain BMP/CBC in one week 3. Please follow up on the following pending results:  Home Health: None.  Equipment/Devices: None.  Discharge Condition:stable.  Alert, still confused.  Likely at baseline.  Occasional agitation.  CODE STATUS: DNR.  Diet recommendation: As tolerated.   Brief/Interim Summary:  Patient was admitted for confusion by Dr Onalee Huaavid on Sept 1, 2017.  As per her H and P:  " Todd [name] is a 82 y.o. male with medical history significant of CVA, Dementia, CKD, dm, HTN lives in snf slumped over at SNF and had some slurred speech sent to ED.  Thought to be a code stroke.  Tele neuro was evaluating pt and noted some twitching to his leg and thought he was having a seizure.  Pt is on valproate acid at SNF orally for unknown reason.  Seizures is not in his chart but "unresponsive state" is.  Pt was given keppra and several rounds of ativan in the ED and he is now sedated.  Pt referred for admission for seizure work up.  Pt cannot provide history.  His DNR yellow form is with his from SNF.  Hospital Course:  Patient did not require intubation, and after being lethargic for half a day, he woke up.  I spoke with his daughter Emelia LoronChristine W.  Who is his HCP/POA, and gather more information, that he had multiple episodes similar to this in the past.  It was not thought to be seizures, rather " ?syncope".   To me, it is quite suspicious for seizure, however.  His MRI of the head was performed, and it did not show any acute CVA.  His EEG was done was well, showing no seizure activity.  Neurology was consulted, and Dr Gerilyn Pilgrimoonquah saw him, and yet did not think it was seizures.  He recommneded to d/c Keppra, which  was started on admission, and increase Depakote to 500mg  BID.  This was thought to be used to Tx his agitation, rather than seizures.  He did have parkinsonism (not PD), and he started him on Sinemet.  However, he became even more agitated.  I discussed all these with his daughter Wynona CanesChristine, and she agreed that we should hold off on giving him more medications.  In any event, he is now back to his baseline.  He is confused all the time, but he know his daughters name, attempted to say his name correctly, and confabulates quite frequently.  He is now stable for discharge, and will be discharged to his SNF today.  He has been and remained a DNR. Of note, his thyroid level was low, and I wonder if he is refusing his medication at the SNF.  It is important that he takes his medication.  His thyroid will be given at 50 mcg per day, and TSH should be recheck in 6 weeks.  I am in hope that his agitation is less when he is at his regular environment.  Antipsychotics were considered, but not started.   He will follow up with his PCP next week, and as needed with neurology.   Thank you and Good Day.  Discharge Diagnoses:  Principal Problem:   Seizure [facility](HCC) Active Problems:   Diabetes mellitus type 2, uncontrolled, with  complications (HCC)   S/P CABG (coronary artery bypass graft)   CKD (chronic kidney disease)   Dementia   CVA (cerebral infarction) history   Type II diabetes mellitus (HCC)   AKI (acute kidney injury) (HCC)   Confusion    Discharge Instructions  Discharge Instructions    Diet - low sodium heart healthy    Complete by:  As directed   Discharge instructions    Complete by:  As directed   Follow up with PCP next week.  Follow up with neurology as recommended as needed.   Increase activity slowly    Complete by:  As directed       Medication List    STOP taking these medications   hydrochlorothiazide 12.5 MG capsule Commonly known as:  MICROZIDE     TAKE these medications    amLODipine 10 MG tablet Commonly known as:  NORVASC Take 10 mg by mouth daily.   aspirin EC 81 MG tablet Take 81 mg by mouth daily.   divalproex 500 MG DR tablet Commonly known as:  DEPAKOTE Take 1 tablet (500 mg total) by mouth 2 (two) times daily. What changed:  medication strength  how much to take  how to take this  when to take this  additional instructions   folic acid 1 MG tablet Commonly known as:  FOLVITE Take 1 mg by mouth daily.   insulin glargine 100 UNIT/ML injection Commonly known as:  LANTUS Inject 15 Units into the skin at bedtime.   insulin lispro 100 UNIT/ML injection Commonly known as:  HUMALOG Inject 10 Units into the skin 2 (two) times daily with a meal.   levothyroxine tablet Commonly known as:  SYNTHROID, LEVOTHROID Take 25 mcg by mouth daily before breakfast.   lisinopril 40 MG tablet Commonly known as:  PRINIVIL,ZESTRIL Take 40 mg by mouth daily.   potassium chloride 20 MEQ packet Commonly known as:  KLOR-CON Take 20 mEq by mouth daily.   Vitamin D-3 1000 units Caps Take 1,000 Units by mouth daily.       No Known Allergies  Consultations:  Neurology.    Procedures/Studies: Mr Laqueta Jean ZO Contrast  Result Date: 06/29/2016 CLINICAL DATA:  Confusion.  Left-sided weakness and slurred speech EXAM: MRI HEAD WITHOUT AND WITH CONTRAST TECHNIQUE: Multiplanar, multiecho pulse sequences of the brain and surrounding structures were obtained without and with intravenous contrast. CONTRAST:  9mL MULTIHANCE GADOBENATE DIMEGLUMINE 529 MG/ML IV SOLN COMPARISON:  CT head 06/28/2016.  MRI 10/04/2014 FINDINGS: Negative for acute infarct. Moderate atrophy. Mild chronic white matter changes. Small chronic infarct right paramedian pons. Normal cerebellum. Mild amount of hemosiderin staining along the cortex of the right frontal parietal lobe likely due to chronic mild subarachnoid hemorrhage. No acute hemorrhage is seen on yesterday's CT. No fluid  collection or mass-effect Negative for mass or edema. Postcontrast imaging demonstrates normal enhancement. Small dural base calcification high right frontal lobe does not enhance and may be dural calcification or small calcified meningioma. Postcontrast imaging is degraded by motion. Extensive mucosal edema in the paranasal sinuses with complete opacification of the left sphenoid sinus and extensive mucosal edema in the left ethmoid sinus. Mucosal edema in the maxillary sinuses left greater than right. This is chronic and unchanged from 2015. No orbital lesion identified.  Pituitary not enlarged. IMPRESSION: Negative for acute infarct. Atrophy and mild chronic ischemic change Chronic sinusitis Electronically Signed   By: Marlan Palau M.D.   On: 06/29/2016 10:56   Ct Head  Code Stroke Wo Contrast`  Addendum Date: 06/29/2016   ADDENDUM REPORT: 06/29/2016 00:50 ADDENDUM: These results were called by telephone at the time of interpretation on 06/28/2016 at 11:35 p.m. to Dr. Devoria Albe , who verbally acknowledged these results. Electronically Signed   By: Deatra Robinson M.D.   On: 06/29/2016 00:50   Result Date: 06/29/2016 CLINICAL DATA:  Code stroke.  Sudden onset left-sided weakness EXAM: CT HEAD WITHOUT CONTRAST TECHNIQUE: Contiguous axial images were obtained from the base of the skull through the vertex without intravenous contrast. COMPARISON:  Head CT 10/02/2015 FINDINGS: Cortical gray-white differentiation is preserved. The basal ganglia and insular cortices are normal. No extra-axial collection, subarachnoid hemorrhage or intraparenchymal hematoma. No mass lesion, midline shift or hydrocephalus.B there is a small dural-based calcification along the right frontal convexity, possibly a small meningioma. Complete opacification of the left sphenoid sinus and posterior ethmoid air cells, unchanged. Mastoids are clear. Normal orbits. ASPECTS [facility] Stroke Program Early CT Score) - Ganglionic level infarction  (caudate, lentiform nuclei, internal capsule, insula, M1-M3 cortex): 7 - Supraganglionic infarction (M4-M6 cortex): 3 Total score (0-10 with 10 being normal): 10 IMPRESSION: 1. No acute intracranial hemorrhage. 2. ASPECTS is 10.  No CT evidence of acute cortical infarct. 3. Small dural base calcification along the right frontal convexity, possibly a small meningioma. Electronically Signed: By: Deatra Robinson M.D. On: 06/28/2016 23:38   Discharge Exam: Vitals:   06/30/16 2256 07/01/16 0601  BP: (!) 185/95 (!) 175/64  Pulse: 95 86  Resp: 18 18  Temp: 98.5 F (36.9 C) 98.8 F (37.1 C)   Vitals:   06/30/16 1520 06/30/16 1530 06/30/16 2256 07/01/16 0601  BP: (!) 159/133 (!) 160/80 (!) 185/95 (!) 175/64  Pulse: 86  95 86  Resp: 18  18 18   Temp: 97.5 F (36.4 C)  98.5 F (36.9 C) 98.8 F (37.1 C)  TempSrc: Oral  Oral Oral  SpO2: 100%  97% 100%  Weight:    90.1 kg (198 lb 10.2 oz)  Height:        General: Pt is alert, awake, not in acute distress Cardiovascular: RRR, S1/S2 +, no rubs, no gallops Respiratory: CTA bilaterally, no wheezing, no rhonchi Abdominal: Soft, NT, ND, bowel sounds + Extremities: no edema, no cyanosis    The results of significant diagnostics from this hospitalization (including imaging, microbiology, ancillary and laboratory) are listed below for reference.     Microbiology: Recent Results (from the past 240 hour(s))  MRSA PCR Screening     Status: None   Collection Time: 06/29/16  5:47 PM  Result Value Ref Range Status   MRSA by PCR NEGATIVE NEGATIVE Final    Comment:        The GeneXpert MRSA Assay (FDA approved for NASAL specimens only), is one component of a comprehensive MRSA colonization surveillance program. It is not intended to diagnose MRSA infection nor to guide or monitor treatment for MRSA infections.      Labs: BNP (last 3 results)  Recent Labs  10/15/15 1145 01/04/16 0720 02/03/16 0900  BNP 101.0* 63.0 116.0*   Basic  Metabolic Panel:  Recent Labs Lab 06/28/16 2305  NA 139  K 4.2  CL 102  CO2 25  GLUCOSE 117*  BUN 38*  CREATININE 1.96*  CALCIUM 8.9   Liver Function Tests:  Recent Labs Lab 06/28/16 2305  AST 19  ALT 16*  ALKPHOS 43  BILITOT 0.3  PROT 7.1  ALBUMIN 3.7   CBC:  Recent Labs  Lab 06/28/16 2305  WBC 9.9  NEUTROABS 5.6  HGB 12.0*  HCT 35.6*  MCV 96.0  PLT 195   Cardiac Enzymes:  Recent Labs Lab 06/29/16 0247 06/29/16 0814 06/29/16 1943  TROPONINI <0.03 <0.03 <0.03   BNP: Invalid input(s): POCBNP CBG:  Recent Labs Lab 06/30/16 1623 06/30/16 2022 07/01/16 0000 07/01/16 0416 07/01/16 0732  GLUCAP 258* 283* 164* 83 86   Thyroid function studies  Recent Labs  06/28/16 2300  TSH 12.424*   Anemia work up  Recent Labs  06/30/16 0855  VITAMINB12 611   Urinalysis    Component Value Date/Time   COLORURINE YELLOW 06/28/2016 2305   APPEARANCEUR CLEAR 06/28/2016 2305   LABSPEC >1.030 (H) 06/28/2016 2305   PHURINE 5.5 06/28/2016 2305   GLUCOSEU NEGATIVE 06/28/2016 2305   HGBUR NEGATIVE 06/28/2016 2305   BILIRUBINUR SMALL (A) 06/28/2016 2305   KETONESUR 15 (A) 06/28/2016 2305   PROTEINUR TRACE (A) 06/28/2016 2305   UROBILINOGEN 0.2 09/01/2015 1835   NITRITE NEGATIVE 06/28/2016 2305   LEUKOCYTESUR NEGATIVE 06/28/2016 2305   Sepsis Labs Invalid input(s): PROCALCITONIN,  WBC,  LACTICIDVEN Microbiology Recent Results (from the past 240 hour(s))  MRSA PCR Screening     Status: None   Collection Time: 06/29/16  5:47 PM  Result Value Ref Range Status   MRSA by PCR NEGATIVE NEGATIVE Final    Comment:        The GeneXpert MRSA Assay (FDA approved for NASAL specimens only), is one component of a comprehensive MRSA colonization surveillance program. It is not intended to diagnose MRSA infection nor to guide or monitor treatment for MRSA infections.      Time coordinating discharge: Over 30 minutes  SIGNED:   Houston Siren, MD FACP [facility] 07/01/2016, 9:42 AM   If 7PM-7AM, please contact night-coverage [email or web address] Password [email or web address]

## 2016-07-01 NOTE — ED Provider Notes (Signed)
[facility] DE[facility] Provider Note   CSN: [id number] Arrival date & time: 07/01/16  1458     History   Chief Complaint Chief Complaint  Patient presents with  . Arm Pain    HPI Todd [name] is a 82 y.o. male.  Patient sent from nursing home with apparent humerus fracture. He was discharged from the hospital yesterday after stay for unresponsive episode and possible seizure. There are no falls documented during his hospitalization. It is unclear whether he fell at the nursing home. They noticed pain and bruising to his left upper arm and reportedly didn't x-ray that showed a humerus fracture. Patient has dementia and is able to give a reasonable history. He does not recall falling. He complains of pain to his left upper arm. As well as his head. Denies any neck pain. Denies any nausea or vomiting. Denies any chest pain or shortness of breath.   The history is provided by the patient.  Arm Pain     Past Medical History:  Diagnosis Date  . Arthritis    "probably"  . Chronic kidney disease    "? stage" (08/18/2014)  . Dementia    "don't know stage or type" (08/18/2014)  . Elevated cholesterol   . Hypertension   . Type II diabetes mellitus [facility])     Patient Active Problem List   Diagnosis Date Noted  . Seizure (HCC) 06/29/2016  . AKI (acute kidney injury) (HCC) 06/29/2016  . Type II diabetes mellitus (HCC)   . Confusion   . Weight gain 08/02/2015  . Anemia, unspecified 06/22/2015  . Conjunctivitis 06/22/2015  . Unresponsive episode 03/20/2015  . Edema 03/11/2015  . Thrombocytopenia (HCC) 11/29/2014  . DVT (deep venous thrombosis) (HCC) 11/28/2014  . Hypokalemia 11/24/2014  . Syncope 10/16/2014  . CVA (cerebral infarction) history   . Altered mental status 08/18/2014  . Diabetes mellitus type 2, uncontrolled, with complications (HCC) 08/18/2014  . CAD (coronary atherosclerotic disease) 08/18/2014  . S/P CABG (coronary artery bypass graft) 08/18/2014  . CKD (chronic  kidney disease) 08/18/2014  . Dementia 08/18/2014  . HTN (hypertension) 08/18/2014  . Dyslipidemia 08/18/2014    Past Surgical History:  Procedure Laterality Date  . CATARACT EXTRACTION    . CORONARY ARTERY BYPASS GRAFT  ?1994   "CABG X3"  . TONSILLECTOMY         Home Medications    Prior to Admission medications   Medication Sig Start Date End Date Taking? Authorizing Provider  amLODipine (NORVASC) 10 MG tablet Take 10 mg by mouth daily.    Historical Provider, MD  aspirin EC 81 MG tablet Take 81 mg by mouth daily.    Historical Provider, MD  Cholecalciferol (VITAMIN D-3) 1000 UNITS CAPS Take 1,000 Units by mouth daily.    Historical Provider, MD  divalproex (DEPAKOTE) 500 MG DR tablet Take 1 tablet (500 mg total) by mouth 2 (two) times daily. 07/01/16   Houston Siren, MD  folic acid (FOLVITE) 1 MG tablet Take 1 mg by mouth daily.    Historical Provider, MD  insulin glargine (LANTUS) 100 UNIT/ML injection Inject 15 Units into the skin at bedtime.     Historical Provider, MD  insulin lispro (HUMALOG) 100 UNIT/ML injection Inject 10 Units into the skin 2 (two) times daily with a meal.    Historical Provider, MD  levothyroxine (SYNTHROID, LEVOTHROID) 25 MCG tablet Take 2 tablets (50 mcg total) by mouth daily before breakfast. 07/01/16   Houston Siren, MD  lisinopril (PRINIVIL,ZESTRIL) 40 MG  tablet Take 40 mg by mouth daily.    Historical Provider, MD  potassium chloride (KLOR-CON) 20 MEQ packet Take 20 mEq by mouth daily.     Historical Provider, MD    Family History No family history on file.  Social History Social History  Substance Use Topics  . Smoking status: Former Smoker    Types: Pipe  . Smokeless tobacco: Never Used     Comment: "quit smoking a pipe in the 80's"  . Alcohol use No     Allergies   Review of patient's allergies indicates no known allergies.   Review of Systems Review of Systems  Unable to perform ROS: Dementia     Physical Exam Updated Vital Signs BP  149/81 (BP Location: Right Arm)   Pulse 89   Temp 97.9 F (36.6 C) (Oral)   Resp 18   Ht 5\' 7"  (1.702 m)   Wt 198 lb (89.8 kg)   SpO2 98%   BMI 31.01 kg/m   Physical Exam  Constitutional: He is oriented to person, place, and time. He appears well-developed and well-nourished. No distress.  HENT:  Head: Normocephalic and atraumatic.  Right Ear: External ear normal.  Left Ear: External ear normal.  Mouth/Throat: Oropharynx is clear and moist.  Eyes: Conjunctivae and EOM are normal. Pupils are equal, round, and reactive to light.  Neck: Normal range of motion.  No C spine tenderness  Cardiovascular: Normal rate, regular rhythm and normal heart sounds.   No murmur heard. Pulmonary/Chest: Effort normal. No respiratory distress. He exhibits no tenderness.  Abdominal: Soft. There is no tenderness. There is no guarding.  Musculoskeletal: Normal range of motion. He exhibits tenderness and deformity.  Tenderness and possible deformity to left upper arm. Intact radial pulse, intact cardinal hand movements.  Raised urticarial type rash to L forearm  Neurological: He is alert and oriented to person, place, and time. No cranial nerve deficit.  Confused at his baseline per her brother at bedside. Patient oriented to person and place. Patient moves all extremities. He is guarding his left arm.     ED Treatments / Results  Labs (all labs ordered are listed, but only abnormal results are displayed) Labs Reviewed  CBC WITH DIFFERENTIAL/PLATELET - Abnormal; Notable for the following:       Result Value   RBC 3.43 (*)    Hemoglobin 11.0 (*)    HCT 32.5 (*)    All other components within normal limits  COMPREHENSIVE METABOLIC PANEL - Abnormal; Notable for the following:    Glucose, Bld 265 (*)    BUN 22 (*)    Creatinine, Ser 1.35 (*)    Calcium 8.8 (*)    Total Protein 6.3 (*)    Albumin 3.3 (*)    ALT 5 (*)    GFR calc non Af Amer 47 (*)    GFR calc Af Amer 55 (*)    All other  components within normal limits  VALPROIC ACID LEVEL    EKG  EKG Interpretation  Date/Time:  Sunday July 01 2016 18:35:18 EDT Ventricular Rate:  104 PR Interval:    QRS Duration: 113 QT Interval:  364 QTC Calculation: 479 R Axis:   12 Text Interpretation:  Sinus tachycardia Inferior infarct, old Consider anterior infarct Lateral leads are also involved Artifact No significant change was found Confirmed by Manus Gunning  MD, Abaigeal Moomaw [phone]) on 07/01/2016 6:39:42 PM       Radiology Dg Chest 1 View  Result Date: 07/01/2016 CLINICAL  DATA:  Fall EXAM: CHEST 1 VIEW COMPARISON:  Portable exam 1603 hours compared to 03/16/2015 FINDINGS: Normal heart size, mediastinal contours, and pulmonary vascularity. Postsurgical changes of CABG. Atherosclerotic calcification aorta. Lungs clear. No pleural effusion or pneumothorax. Bones demineralized. Displaced oblique fracture of the proximal LEFT humerus again identified. IMPRESSION: No acute abnormalities. Post CABG. Aortic atherosclerosis. Electronically Signed   By: Ulyses Southward M.D.   On: 07/01/2016 16:34   Dg Pelvis 1-2 Views  Result Date: 07/01/2016 CLINICAL DATA:  Fall EXAM: PELVIS - 1-2 VIEW COMPARISON:  02/15/2016 FINDINGS: Diffuse osseous demineralization. BILATERAL hip joint space narrowing slightly greater on RIGHT. SI joints preserved. Degenerative disc disease changes at lower lumbar spine. No definite acute fracture, dislocation, or bone destruction. Scattered atherosclerotic calcifications. IMPRESSION: Osseous demineralization with mild degenerative changes of both hips and more advanced degenerative changes at the visualized lower lumbar spine. No acute osseous abnormalities. Electronically Signed   By: Ulyses Southward M.D.   On: 07/01/2016 16:33   Ct Head Wo Contrast  Result Date: 07/01/2016 CLINICAL DATA:  [facility] staff reports pt was discharged today from [facility] and when he arrived to [facility] they noticed he had swelling, bruising, and  blisters to left arm. They did an xray and found that pt has a humerus fracture. EXAM: CT HEAD WITHOUT CONTRAST TECHNIQUE: Contiguous axial images were obtained from the base of the skull through the vertex without intravenous contrast. COMPARISON:  06/28/2016 FINDINGS: Brain: There is significant central and cortical atrophy. Periventricular white matter changes are consistent with small vessel disease. An extra-axial calcified mass is identified adjacent to the right frontal bone measuring 8 mm and stable in appearance compared with prior study. There is no associated edema on, mass-effect, or hemorrhage. Vascular: Significant calcification of the internal carotid arteries. Skull: No calvarial fracture. Sinuses/Orbits: Significant opacification of the left sphenoid air cell and multiple bilateral ethmoid air cells. Mastoid air cells are normally aerated. Other: None IMPRESSION: 1. Atrophy and small vessel disease. 2.  No evidence for acute  abnormality. 3. Stable right frontal calcified mass measuring 8 mm. This may represent a small meningioma or meningeal calcification and appears stable. Electronically Signed   By: Norva Pavlov M.D.   On: 07/01/2016 16:59   Dg Shoulder Left  Result Date: 07/01/2016 CLINICAL DATA:  Fall EXAM: LEFT SHOULDER - 2+ VIEW COMPARISON:  None FINDINGS: Osseous demineralization. AC joint alignment normal. Oblique fracture at surgical neck LEFT humerus extending down the proximal diaphysis, mildly displaced. No definite dislocation. No additional fracture or bone destruction seen. Visualized LEFT ribs intact. IMPRESSION: Displaced oblique fracture at surgical neck LEFT humerus extending into the proximal diaphysis. Electronically Signed   By: Ulyses Southward M.D.   On: 07/01/2016 16:54   Dg Humerus Left  Result Date: 07/01/2016 CLINICAL DATA:  Swelling, bruising and blisters on LEFT arm, recent discharge from hospital, humeral fracture EXAM: LEFT HUMERUS - 2+ VIEW COMPARISON:  None  FINDINGS: Osseous demineralization. AC joint alignment normal. Elbow and glenohumeral joint alignments grossly normal Displaced oblique fracture of the surgical neck LEFT humerus extending a short way down the proximal shaft. No additional fracture or dislocation. Bony excrescence question osteochondroma at distal LEFT humeral metaphysis. IMPRESSION: Displaced oblique fracture of the proximal LEFT humerus. 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 Electronically Signed   By: Ulyses Southward M.D.   On: 07/01/2016 16:32    Procedures Procedures (including critical care time)  Medications Ordered in ED Medications - No data to display  Initial Impression / Assessment and Plan / ED Course  I have reviewed the triage vital signs and the nursing notes.  Pertinent labs & imaging results that were available during my care of the patient were reviewed by me and considered in my medical decision making (see chart for details).  Clinical Course  Humerus fracture from nursing home. No reported or witnessed fall. However patient did have seizure activity with initially presented to the ED on August 31. D/w Dr. Conley RollsLe who does not recall any falls during patient's hospitalization.  Discharge summary reviewed. Patient thought to have dementia and Parkinsonism without evidence of epilepsy.  MRI negative for infarct.   Imaging confirms L proximal humerus fracture. Possible osteochondroma. D/w Dr. Magnus IvanBlackman who agrees with sling and outpatient management.  Labs show hyperglycemia without DKA. Creatinine is improved from previous. CT head is negative.  Patient appears stable to return to nursing facility. Follow-up with orthopedics discussed. Final Clinical Impressions(s) / ED Diagnoses   Final diagnoses:  Closed fracture of left proximal humerus, initial encounter    New Prescriptions New Prescriptions   No medications on file     Glynn OctaveStephen Ezell Melikian, MD 07/01/16 1845

## 2016-07-01 NOTE — Progress Notes (Signed)
Pt has been very restless tonight. He has require constant observation to keep safe and in the bed.

## 2016-07-02 ENCOUNTER — Non-Acute Institutional Stay (SKILLED_NURSING_FACILITY): Payer: Medicare Other | Admitting: Internal Medicine

## 2016-07-02 DIAGNOSIS — R4182 Altered mental status, unspecified: Secondary | ICD-10-CM | POA: Diagnosis not present

## 2016-07-02 DIAGNOSIS — S42202D Unspecified fracture of upper end of left humerus, subsequent encounter for fracture with routine healing: Secondary | ICD-10-CM

## 2016-07-02 DIAGNOSIS — I10 Essential (primary) hypertension: Secondary | ICD-10-CM | POA: Diagnosis not present

## 2016-07-02 NOTE — Progress Notes (Signed)
This is an acute visit.  Level care skilled.  Facility [facility].  Chief complaint-acute visit follow-up episodes of unresponsiveness requiring ER visit-also left proximal humerus fracture which also resulted in any R visit.  History of present illness. Patient is a pleasant 82 year old male who is a long-term resident of this facility he has had a period of extended stability but in the previous has had syncopal-type episodes which were difficult to explain.  At one point was thought possibly this was seizure activity.  He was on Depakote but this was more for behaviors.  Apparently over the weekend he was found unresponsive slumped over-sent to the ER.  He was treated initially with Keppra and Ativan but this was sedating to patient.  Workup was negative for an acute stroke with a negative CT scan as well as MRI of the head.  EEG appear to be negative for seizure activity.  After approximately half a day the patient did wake up and apparently returned to baseline and apparently was somewhat agitated event.  He was seen by neurology and eventually his Depakote was increased and he did return to the facility.  Shortly thereafter nursing staff noted apparently a deformity of his left upper arm-x-ray didn't show a left proximal humerus fracture.  Subsequently he went back to the ER-and he did receive a sling and orthopedic consult was arranged.  Currently he is resting comfortably in bed he does have a sling on however he does not appear to be a strong this he was previously.  In fact nursing staff feels he's had a couple short's type seizures today that were very short in duration.  his vital signs have been stable.  apparently he is increasingly weak when he stands orthostatic blood pressures were done.  lying was 177/78--sitting was 130/80--and standing was 110/80.  He is on Norvasc 10 mg a day as well as lisinopril 40 mg a day  When he went to the ER he was also  found to have an elevated TSH of 12.424 this had been normal proximally a month ago his Synthroid was increased.  Past Medical History:  Diagnosis Date  . Arthritis    "probably"  . Chronic kidney disease    "? stage" (08/18/2014)  . Dementia    "don't know stage or type" (08/18/2014)  . Elevated cholesterol   . Hypertension   . Type II diabetes mellitus [facility])         Patient Active Problem List   Diagnosis Date Noted  . Seizure (HCC) 06/29/2016  . AKI (acute kidney injury) (HCC) 06/29/2016  . Type II diabetes mellitus (HCC)   . Confusion   . Weight gain 08/02/2015  . Anemia, unspecified 06/22/2015  . Conjunctivitis 06/22/2015  . Unresponsive episode 03/20/2015  . Edema 03/11/2015  . Thrombocytopenia (HCC) 11/29/2014  . DVT (deep venous thrombosis) (HCC) 11/28/2014  . Hypokalemia 11/24/2014  . Syncope 10/16/2014  . CVA (cerebral infarction) history   . Altered mental status 08/18/2014  . Diabetes mellitus type 2, uncontrolled, with complications (HCC) 08/18/2014  . CAD (coronary atherosclerotic disease) 08/18/2014  . S/P CABG (coronary artery bypass graft) 08/18/2014  . CKD (chronic kidney disease) 08/18/2014  . Dementia 08/18/2014  . HTN (hypertension) 08/18/2014  . Dyslipidemia 08/18/2014         Past Surgical History:  Procedure Laterality Date  . CATARACT EXTRACTION    . CORONARY ARTERY BYPASS GRAFT  ?1994   "CABG X3"  . TONSILLECTOMY  Home Medications                                Prior to Admission medications   Medication Sig Start Date End Date Taking? Authorizing Provider  amLODipine (NORVASC) 10 MG tablet Take 10 mg by mouth daily.    Historical Provider, MD  aspirin EC 81 MG tablet Take 81 mg by mouth daily.    Historical Provider, MD  Cholecalciferol (VITAMIN D-3) 1000 UNITS CAPS Take 1,000 Units by mouth daily.    Historical Provider, MD  divalproex (DEPAKOTE) 500 MG DR tablet Take 1 tablet (500  mg total) by mouth 2 (two) times daily. 07/01/16   Houston Siren, MD  folic acid (FOLVITE) 1 MG tablet Take 1 mg by mouth daily.    Historical Provider, MD  insulin glargine (LANTUS) 100 UNIT/ML injection Inject 15 Units into the skin at bedtime.     Historical Provider, MD  insulin lispro (HUMALOG) 100 UNIT/ML injection Inject 10 Units into the skin 2 (two) times daily with a meal.    Historical Provider, MD  levothyroxine (SYNTHROID, LEVOTHROID) 25 MCG tablet Take 2 tablets (50 mcg total) by mouth daily before breakfast. 07/01/16   Houston Siren, MD  lisinopril (PRINIVIL,ZESTRIL) 40 MG tablet Take 40 mg by mouth daily.    Historical Provider, MD  potassium chloride (KLOR-CON) 20 MEQ packet Take 20 mEq by mouth daily.     Historical Provider, MD    Family History No family history on file.  Social History       Social History  Substance Use Topics  . Smoking status: Former Smoker    Types: Pipe  . Smokeless tobacco: Never Used     Comment: "quit smoking a pipe in the 80's"  . Alcohol use No     Allergies                     Review of patient's allergies indicates no known allergies.   Review of Systems Review of Systems  Unable to perform ROS: Dementia -Please see history of present illness    Physical Exam He is afebrile pulse of 88 respirations 18 blood pressure less than 122/72-orthostatic blood pressures as noted above   Physical Exam  Constitutional: H Well-nourished elderly male in no distress lying comfortably in bed he does appear weaker than he has in the past Skin is warm and dry.  HENT:  Head: Normocephalic and atraumatic.   Eyes pupils appear reactive light sclera and conjunctiva are clear visual acuity appears grossly intact  Mouth/Throat: Oropharynx is clear and moist.   t.  Neck: Normal range of motion.   Cardiovascular: Normal rate, regular rhythm and normal heart sounds.   No murmur heard.--Has baseline lower extremity edema  bilaterally Pulmonary/Chest: Effort normal. No respiratory distress. He exhibits no tenderness.  Abdominal: Soft. There is no tenderness. There is no guarding.  Musculoskeletal: Normal range of motion. He exhibits tenderness and deformity. The left upper arm it is in a sling   Radial pulses intact capillary refill intact he is able to move his fingers with some grip strength although appears to have some pain    Neurological: He is alert moves all extremities. He is guarding his left arm.     Labs.  07/01/2016.  Depakote level was 52--on 06/28/2016 was 37.  07/01/2016.  WBC 7.6 hemoglobin 11.0 platelets 183.  Sodium 136 potassium 4.3 BUN 22  creatinine 1.35.  I do not creatinine was 1.96 on 06/28/2016.  06/28/2016.  TSH 12.424.  Assessment and plan.  #1 syncopal episodes? Seizures?-Again this was worked up in the hospital but no clear etiology was found-she does have a history of this I did speak with his daughter via phone as well-states at times she will be stable for an extended period of time and then have recurrent-type episodes-this appears to be the case currently. Again he was evaluated by neurology and his Depakote has been increased Vital signs appear to be relatively stable at this point will make bed rest a priority since apparently he has increased weakness when he attempts to stand.  This will need close follow-up with vital signs pulse ox and neurologic checks every 4 hours for 24 hours and then every shift.  Also will update a CBC and metabolic panel tomorrow as a Depakote level.  His TSH again was elevated will need to do a repeat TSH in 6 weeks his Synthroid was increased  #2-history of left proximal humerus fracture-again orthopedic follow-up has been arranged-she currently has his arm in a sling he is having some pain Will start Tylenol 650 mg every 6 hours when necessary-had order for tramadol for breakthrough pain but apparently this increases his  threshold for seizures-pharmacy is concerned about this-we will monitor with the Tylenol but we may need something stronger again this will have to be watched carefully as well.  #3 hypertension-hypertension-again he appears to be increasingly weak when he stands 6 standing blood pressure was 110/80-with systolic was initially elevated at 177 while he was lying-this appears to have some variability at this point will monitor-she is on Norvasc as well as lisinopril will see how he does in the next day or 2 again he will need close follow-up.  [id number]-of note greater than 45 minutes spent assessing patient-reviewing records of his ER visits and workups-reviewing his labs-his chart-discussion with his daughter via phone-as well as with nursing in the facility-and coordinating and formulating a plan of care-of note greater than 50% of time spent coordinating plan of care

## 2016-07-03 ENCOUNTER — Encounter (HOSPITAL_COMMUNITY)
Admission: RE | Admit: 2016-07-03 | Discharge: 2016-07-03 | Disposition: A | Discharge status: Home / Self Care | Payer: Medicare Other | Source: Skilled Nursing Facility | Attending: Internal Medicine | Admitting: Internal Medicine

## 2016-07-03 ENCOUNTER — Ambulatory Visit (INDEPENDENT_AMBULATORY_CARE_PROVIDER_SITE_OTHER): Payer: Medicare Other | Admitting: Orthopaedic Surgery

## 2016-07-03 ENCOUNTER — Encounter: Payer: Self-pay | Admitting: Orthopaedic Surgery

## 2016-07-03 VITALS — BP 142/68 | HR 87 | Temp 97.5°F

## 2016-07-03 DIAGNOSIS — G40309 Generalized idiopathic epilepsy and epileptic syndromes, not intractable, without status epilepticus: Secondary | ICD-10-CM | POA: Diagnosis present

## 2016-07-03 DIAGNOSIS — I639 Cerebral infarction, unspecified: Secondary | ICD-10-CM | POA: Diagnosis present

## 2016-07-03 DIAGNOSIS — E119 Type 2 diabetes mellitus without complications: Secondary | ICD-10-CM | POA: Diagnosis not present

## 2016-07-03 DIAGNOSIS — R569 Unspecified convulsions: Secondary | ICD-10-CM | POA: Insufficient documentation

## 2016-07-03 DIAGNOSIS — S42202A Unspecified fracture of upper end of left humerus, initial encounter for closed fracture: Secondary | ICD-10-CM | POA: Diagnosis not present

## 2016-07-03 DIAGNOSIS — N179 Acute kidney failure, unspecified: Secondary | ICD-10-CM | POA: Insufficient documentation

## 2016-07-03 DIAGNOSIS — R410 Disorientation, unspecified: Secondary | ICD-10-CM | POA: Insufficient documentation

## 2016-07-03 LAB — CBC WITH DIFFERENTIAL/PLATELET
BASOS PCT: 0 %
Basophils Absolute: 0 10*3/uL (ref 0.0–0.1)
EOS ABS: 0 10*3/uL (ref 0.0–0.7)
EOS PCT: 0 %
HCT: 25.9 % — ABNORMAL LOW (ref 39.0–52.0)
HEMOGLOBIN: 8.9 g/dL — AB (ref 13.0–17.0)
Lymphocytes Relative: 19 %
Lymphs Abs: 2.2 10*3/uL (ref 0.7–4.0)
MCH: 32.5 pg (ref 26.0–34.0)
MCHC: 34.4 g/dL (ref 30.0–36.0)
MCV: 94.5 fL (ref 78.0–100.0)
MONO ABS: 1.5 10*3/uL — AB (ref 0.1–1.0)
Monocytes Relative: 13 %
Neutro Abs: 7.6 10*3/uL (ref 1.7–7.7)
Neutrophils Relative %: 68 %
Platelets: 171 10*3/uL (ref 150–400)
RBC: 2.74 MIL/uL — ABNORMAL LOW (ref 4.22–5.81)
RDW: 12.9 % (ref 11.5–15.5)
WBC: 11.3 10*3/uL — ABNORMAL HIGH (ref 4.0–10.5)

## 2016-07-03 LAB — BASIC METABOLIC PANEL
Anion gap: 10 (ref 5–15)
BUN: 28 mg/dL — ABNORMAL HIGH (ref 6–20)
CALCIUM: 8.7 mg/dL — AB (ref 8.9–10.3)
CO2: 26 mmol/L (ref 22–32)
CREATININE: 1.26 mg/dL — AB (ref 0.61–1.24)
Chloride: 103 mmol/L (ref 101–111)
GFR calc non Af Amer: 51 mL/min — ABNORMAL LOW (ref >60)
GFR, EST AFRICAN AMERICAN: 60 mL/min — AB (ref >60)
Glucose, Bld: 267 mg/dL — ABNORMAL HIGH (ref 65–99)
Potassium: 4.2 mmol/L (ref 3.5–5.1)
SODIUM: 139 mmol/L (ref 135–145)

## 2016-07-03 LAB — VALPROIC ACID LEVEL: VALPROIC ACID LVL: 48 ug/mL — AB (ref 50.0–100.0)

## 2016-07-03 LAB — HOMOCYSTEINE: HOMOCYSTEINE-NORM: 9.3 umol/L (ref 0.0–15.0)

## 2016-07-03 NOTE — Progress Notes (Signed)
Subjective:  He broke his left shoulder    Patient ID: Todd [name], male    DOB: [birth year], 82 y.o.   MRN: [id number]  HPI History from son-in-law's mother who accompanies him.  He is resident at [facility].  He hurt his left shoulder.  He was found on floor at nursing home on 07-01-16.  He was seen in the ER.  Multiple x-rays were done.  The shoulder x-ray shows: IMPRESSION: Displaced oblique fracture at surgical neck LEFT humerus extending into the proximal diaphysis.  He is in a sling.  He is not talkative at all.   Review of Systems  HENT: Negative for congestion.   Respiratory: Negative for cough and shortness of breath.   Cardiovascular: Negative for chest pain and leg swelling.  Endocrine: Positive for cold intolerance.  Musculoskeletal: Positive for arthralgias and joint swelling.  Allergic/Immunologic: Positive for environmental allergies.  Neurological: Positive for seizures.   Past Medical History:  Diagnosis Date  . Arthritis    "probably"  . Chronic kidney disease    "? stage" (08/18/2014)  . Dementia    "don't know stage or type" (08/18/2014)  . Elevated cholesterol   . Hypertension   . Type II diabetes mellitus (HCC)     Past Surgical History:  Procedure Laterality Date  . CATARACT EXTRACTION    . CORONARY ARTERY BYPASS GRAFT  ?1994   "CABG X3"  . TONSILLECTOMY      Current Outpatient Prescriptions on File Prior to Visit  Medication Sig Dispense Refill  . amLODipine (NORVASC) 10 MG tablet Take 10 mg by mouth daily.    [name] aspirin EC 81 MG tablet Take 81 mg by mouth daily.    . Cholecalciferol (VITAMIN D-3) 1000 UNITS CAPS Take 1,000 Units by mouth daily.    . divalproex (DEPAKOTE) 500 MG DR tablet Take 1 tablet (500 mg total) by mouth 2 (two) times daily. 60 tablet 1  . folic acid (FOLVITE) 1 MG tablet Take 1 mg by mouth daily.    . insulin glargine (LANTUS) 100 UNIT/ML injection Inject 15 Units into the skin at bedtime.     . insulin  lispro (HUMALOG) 100 UNIT/ML injection Inject 10 Units into the skin 2 (two) times daily with a meal. With Lunch and Evening Meal    . levothyroxine (SYNTHROID, LEVOTHROID) 25 MCG tablet Take 2 tablets (50 mcg total) by mouth daily before breakfast. 60 tablet 1  . lisinopril (PRINIVIL,ZESTRIL) 40 MG tablet Take 40 mg by mouth daily.    . potassium chloride (KLOR-CON) 20 MEQ packet Take 20 mEq by mouth daily.      No current facility-administered medications on file prior to visit.     Social History   Social History  . Marital status: Widowed    Spouse name: N/A  . Number of children: N/A  . Years of education: N/A   Occupational History  . Not on file.   Social History Main Topics  . Smoking status: Former Smoker    Types: Pipe  . Smokeless tobacco: Never Used     Comment: "quit smoking a pipe in the 80's"  . Alcohol use No  . Drug use: No  . Sexual activity: Not on file   Other Topics Concern  . Not on file   Social History Narrative  . No narrative on file    She states that heart disease runs in his family BP (!) 142/68   Pulse 87   Temp  97.5 F (36.4 C)      Objective:   Physical Exam  Constitutional: He is oriented to person, place, and time. He appears well-developed and well-nourished.  HENT:  Head: Normocephalic and atraumatic.  Eyes: Conjunctivae and EOM are normal. Pupils are equal, round, and reactive to light.  Neck: Normal range of motion. Neck supple.  Cardiovascular: Normal rate, regular rhythm and intact distal pulses.   Pulmonary/Chest: Effort normal.  Abdominal: Soft.  Musculoskeletal: He exhibits tenderness (Pain with motion of the left shoulder, ecchymosis present.  NV intact.  Right shoulder negative.).  Neurological: He is alert and oriented to person, place, and time. He has normal reflexes. He displays normal reflexes. No cranial nerve deficit. He exhibits normal muscle tone. Coordination normal.  Skin: Skin is warm and dry.   Psychiatric: His behavior is normal.  Not talkative at all.  I do not think he knows where he is.          Assessment & Plan:   Encounter Diagnosis  Name Primary?  . Closed fracture of left proximal humerus, initial encounter Yes   Continue the sling.  Notes written for nursing home.  Return in one week.  X-rays of left shoulder on return.  Call if any problem.  Electronically Signed Darreld McleanWayne Terryn Rosenkranz, MD 9/5/20172:06 PM

## 2016-07-04 ENCOUNTER — Other Ambulatory Visit: Payer: Self-pay | Admitting: *Deleted

## 2016-07-04 ENCOUNTER — Encounter: Payer: Self-pay | Admitting: Internal Medicine

## 2016-07-04 ENCOUNTER — Non-Acute Institutional Stay (SKILLED_NURSING_FACILITY): Payer: Medicare Other | Admitting: Internal Medicine

## 2016-07-04 DIAGNOSIS — E039 Hypothyroidism, unspecified: Secondary | ICD-10-CM | POA: Diagnosis not present

## 2016-07-04 DIAGNOSIS — R4189 Other symptoms and signs involving cognitive functions and awareness: Secondary | ICD-10-CM

## 2016-07-04 DIAGNOSIS — R404 Transient alteration of awareness: Secondary | ICD-10-CM

## 2016-07-04 DIAGNOSIS — D649 Anemia, unspecified: Secondary | ICD-10-CM

## 2016-07-04 MED ORDER — HYDROCODONE-ACETAMINOPHEN 5-325 MG PO TABS: ORAL_TABLET | ORAL | 0 refills | Status: DC

## 2016-07-04 NOTE — Progress Notes (Addendum)
Location:   [facility] Room Number: 104/D Place of Service:  SNF (31) Provider:  Beola CordAnjali,Miryam Mcelhinney  Barr, Julie, NP  Patient Care Team: Marletta LorJulie Barr, NP as PCP - General (Nurse Practitioner)  Extended Emergency Contact Information Primary Emergency Contact: Welborne,[name] Address: [address]          [place], [place] [id number] [place] Home Phone: [phone] Mobile Phone: [phone] Relation: Daughter Secondary Emergency Contact: Gibson RampWelborne,Gloria Address: [address]          [place], [place] [address] [place] Home Phone: [phone] Mobile Phone: [phone] Relation: Relative  Code Status:  DNR Goals of care: Advanced Directive information Advanced Directives 07/04/2016  Does patient have an advance directive? Yes  Type of Advance Directive Out of facility DNR (pink MOST or yellow form)  Does patient want to make changes to advanced directive? No - Patient declined  Copy of advanced directive(s) in chart? Yes  Pre-existing out of facility DNR order (yellow form or pink MOST form) -     Chief Complaint  Patient presents with  . Acute Visit    Fall, Syncope    HPI:  Pt is a 82 y.o. male seen today for an acute visit for recurrent Syncope. Patient has h/o recurrent passing out. It has been going on for past 2 years. He has been evaluated many times with extensive w/u. Most of the history was obtained by talking to the nurses and his chart. He was in the hospital with the syncopal episode again. According to Neurologist it was reasonable to treat him for presumed seizures. Though previous EEG has been negative.And stroke was r/o in hospital According to the nurse once they put him in bed and make him lie down he usually wakes up. And he has no incontinence of urine or stools. Patient is unable to give me any history due to moderate dementia. He was started on higher dose of Depakote. During this time he was also found  to have fractured his humerus and is in sling now.   Past Medical History:  Diagnosis Date  . Arthritis    "probably"  . Chronic kidney disease      . Dementia      . Elevated cholesterol   . Hypertension   . Type II diabetes mellitus (HCC)    Past Surgical History:  Procedure Laterality Date  . CATARACT EXTRACTION    . CORONARY ARTERY BYPASS GRAFT  ?1994   "CABG X3"  . TONSILLECTOMY      No Known Allergies  Current Outpatient Prescriptions on File Prior to Visit  Medication Sig Dispense Refill  . amLODipine (NORVASC) 10 MG tablet Take 10 mg by mouth daily.    [name]. aspirin EC 81 MG tablet Take 81 mg by mouth daily.    . Cholecalciferol (VITAMIN D-3) 1000 UNITS CAPS Take 1,000 Units by mouth daily.    . divalproex (DEPAKOTE) 500 MG DR tablet Take 1 tablet (500 mg total) by mouth 2 (two) times daily. 60 tablet 1  . folic acid (FOLVITE) 1 MG tablet Take 1 mg by mouth daily.    . insulin glargine (LANTUS) 100 UNIT/ML injection Inject 15 Units into the skin at bedtime.     . insulin lispro (HUMALOG) 100 UNIT/ML injection Inject 10 Units into the skin 2 (two) times daily with a meal. With Lunch and Evening Meal    . levothyroxine (SYNTHROID, LEVOTHROID) 25 MCG tablet Take 2 tablets (  50 mcg total) by mouth daily before breakfast. 60 tablet 1  . lisinopril (PRINIVIL,ZESTRIL) 40 MG tablet Take 40 mg by mouth daily.    . potassium chloride (KLOR-CON) 20 MEQ packet Take 20 mEq by mouth daily.      No current facility-administered medications on file prior to visit.     Review of Systems  Unable to perform ROS: Dementia      No flowsheet data found. Functional Status Survey:    Vitals:   07/04/16 1133  BP: (!) 148/86  Pulse: 82  Resp: 20  Temp: 98.4 F (36.9 C)  TempSrc: Oral  SpO2: 94%   There is no height or weight on file to calculate BMI. Physical Exam  Constitutional: He appears well-developed and well-nourished.  HENT:  Head: Normocephalic.  Mouth/Throat:  Oropharynx is clear and moist.  Cardiovascular: Normal rate, regular rhythm and normal heart sounds.   Pulmonary/Chest: Breath sounds normal. No respiratory distress. He has no wheezes. He has no rales.  Abdominal: Soft. Bowel sounds are normal. He exhibits no distension. There is no tenderness. There is no rebound.  Neurological: He is alert.  No oriented to time place or person. Left arm in sling due to Fracture.    Labs reviewed:  Recent Labs  06/28/16 2305 07/01/16 1602 07/03/16 0710  NA 139 136 139  K 4.2 4.3 4.2  CL 102 104 103  CO2 25 23 26   GLUCOSE 117* 265* 267*  BUN 38* 22* 28*  CREATININE 1.96* 1.35* 1.26*  CALCIUM 8.9 8.8* 8.7*    Recent Labs  02/16/16 0700 06/28/16 2305 07/01/16 1602  AST 17 19 25   ALT 15* 16* 5*  ALKPHOS 36* 43 46  BILITOT 0.8 0.3 0.7  PROT 6.1* 7.1 6.3*  ALBUMIN 3.1* 3.7 3.3*    Recent Labs  06/28/16 2305 07/01/16 1602 07/03/16 0710  WBC 9.9 7.6 11.3*  NEUTROABS 5.6 5.8 7.6  HGB 12.0* 11.0* 8.9*  HCT 35.6* 32.5* 25.9*  MCV 96.0 94.8 94.5  PLT 195 183 171   Lab Results  Component Value Date   TSH 12.424 (H) 06/28/2016   Lab Results  Component Value Date   HGBA1C 9.1 (H) 05/30/2016   Lab Results  Component Value Date   CHOL 189 12/23/2014   HDL 44 12/23/2014   LDLCALC 125 (H) 12/23/2014   TRIG 101 12/23/2014   CHOLHDL 4.3 12/23/2014    Significant Diagnostic Results in last 30 days:  Dg Chest 1 View  Result Date: 07/01/2016 CLINICAL DATA:  Fall EXAM: CHEST 1 VIEW COMPARISON:  Portable exam 1603 hours compared to 03/16/2015 FINDINGS: Normal heart size, mediastinal contours, and pulmonary vascularity. Postsurgical changes of CABG. Atherosclerotic calcification aorta. Lungs clear. No pleural effusion or pneumothorax. Bones demineralized. Displaced oblique fracture of the proximal LEFT humerus again identified. IMPRESSION: No acute abnormalities. Post CABG. Aortic atherosclerosis. Electronically Signed   By: Ulyses Southward  M.D.   On: 07/01/2016 16:34   Dg Pelvis 1-2 Views  Result Date: 07/01/2016 CLINICAL DATA:  Fall EXAM: PELVIS - 1-2 VIEW COMPARISON:  02/15/2016 FINDINGS: Diffuse osseous demineralization. BILATERAL hip joint space narrowing slightly greater on RIGHT. SI joints preserved. Degenerative disc disease changes at lower lumbar spine. No definite acute fracture, dislocation, or bone destruction. Scattered atherosclerotic calcifications. IMPRESSION: Osseous demineralization with mild degenerative changes of both hips and more advanced degenerative changes at the visualized lower lumbar spine. No acute osseous abnormalities. Electronically Signed   By: Ulyses Southward M.D.   On: 07/01/2016  16:33   Ct Head Wo Contrast  Result Date: 07/01/2016 CLINICAL DATA:  [facility] staff reports pt was discharged today from [facility] and when he arrived to [facility] they noticed he had swelling, bruising, and blisters to left arm. They did an xray and found that pt has a humerus fracture. EXAM: CT HEAD WITHOUT CONTRAST TECHNIQUE: Contiguous axial images were obtained from the base of the skull through the vertex without intravenous contrast. COMPARISON:  06/28/2016 FINDINGS: Brain: There is significant central and cortical atrophy. Periventricular white matter changes are consistent with small vessel disease. An extra-axial calcified mass is identified adjacent to the right frontal bone measuring 8 mm and stable in appearance compared with prior study. There is no associated edema on, mass-effect, or hemorrhage. Vascular: Significant calcification of the internal carotid arteries. Skull: No calvarial fracture. Sinuses/Orbits: Significant opacification of the left sphenoid air cell and multiple bilateral ethmoid air cells. Mastoid air cells are normally aerated. Other: None IMPRESSION: 1. Atrophy and small vessel disease. 2.  No evidence for acute  abnormality. 3. Stable right frontal calcified mass measuring 8 mm. This may represent a small  meningioma or meningeal calcification and appears stable. Electronically Signed   By: Norva Pavlov M.D.   On: 07/01/2016 16:59   Mr Laqueta Jean NW Contrast  Result Date: 06/29/2016 CLINICAL DATA:  Confusion.  Left-sided weakness and slurred speech EXAM: MRI HEAD WITHOUT AND WITH CONTRAST TECHNIQUE: Multiplanar, multiecho pulse sequences of the brain and surrounding structures were obtained without and with intravenous contrast. CONTRAST:  9mL MULTIHANCE GADOBENATE DIMEGLUMINE 529 MG/ML IV SOLN COMPARISON:  CT head 06/28/2016.  MRI 10/04/2014 FINDINGS: Negative for acute infarct. Moderate atrophy. Mild chronic white matter changes. Small chronic infarct right paramedian pons. Normal cerebellum. Mild amount of hemosiderin staining along the cortex of the right frontal parietal lobe likely due to chronic mild subarachnoid hemorrhage. No acute hemorrhage is seen on yesterday's CT. No fluid collection or mass-effect Negative for mass or edema. Postcontrast imaging demonstrates normal enhancement. Small dural base calcification high right frontal lobe does not enhance and may be dural calcification or small calcified meningioma. Postcontrast imaging is degraded by motion. Extensive mucosal edema in the paranasal sinuses with complete opacification of the left sphenoid sinus and extensive mucosal edema in the left ethmoid sinus. Mucosal edema in the maxillary sinuses left greater than right. This is chronic and unchanged from 2015. No orbital lesion identified.  Pituitary not enlarged. IMPRESSION: Negative for acute infarct. Atrophy and mild chronic ischemic change Chronic sinusitis Electronically Signed   By: Marlan Palau M.D.   On: 06/29/2016 10:56   Dg Shoulder Left  Result Date: 07/01/2016 CLINICAL DATA:  Fall EXAM: LEFT SHOULDER - 2+ VIEW COMPARISON:  None FINDINGS: Osseous demineralization. AC joint alignment normal. Oblique fracture at surgical neck LEFT humerus extending down the proximal diaphysis, mildly  displaced. No definite dislocation. No additional fracture or bone destruction seen. Visualized LEFT ribs intact. IMPRESSION: Displaced oblique fracture at surgical neck LEFT humerus extending into the proximal diaphysis. Electronically Signed   By: Ulyses Southward M.D.   On: 07/01/2016 16:54   Dg Humerus Left  Result Date: 07/01/2016 CLINICAL DATA:  Swelling, bruising and blisters on LEFT arm, recent discharge from hospital, humeral fracture EXAM: LEFT HUMERUS - 2+ VIEW COMPARISON:  None FINDINGS: Osseous demineralization. AC joint alignment normal. Elbow and glenohumeral joint alignments grossly normal Displaced oblique fracture of the surgical neck LEFT humerus extending a short way down the proximal shaft. No additional  fracture or dislocation. Bony excrescence question osteochondroma at distal LEFT humeral metaphysis. IMPRESSION: Displaced oblique fracture of the proximal LEFT humerus. 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 Electronically Signed   By: Ulyses Southward M.D.   On: 07/01/2016 16:32   Ct Head Code Stroke Wo Contrast`  Addendum Date: 06/29/2016   ADDENDUM REPORT: 06/29/2016 00:50 ADDENDUM: These results were called by telephone at the time of interpretation on 06/28/2016 at 11:35 p.m. to Dr. Devoria Albe , who verbally acknowledged these results. Electronically Signed   By: Deatra Robinson M.D.   On: 06/29/2016 00:50   Result Date: 06/29/2016 CLINICAL DATA:  Code stroke.  Sudden onset left-sided weakness EXAM: CT HEAD WITHOUT CONTRAST TECHNIQUE: Contiguous axial images were obtained from the base of the skull through the vertex without intravenous contrast. COMPARISON:  Head CT 10/02/2015 FINDINGS: Cortical gray-white differentiation is preserved. The basal ganglia and insular cortices are normal. No extra-axial collection, subarachnoid hemorrhage or intraparenchymal hematoma. No mass lesion, midline shift or hydrocephalus.B there is a small dural-based calcification along the right frontal  convexity, possibly a small meningioma. Complete opacification of the left sphenoid sinus and posterior ethmoid air cells, unchanged. Mastoids are clear. Normal orbits. ASPECTS [facility] Stroke Program Early CT Score) - Ganglionic level infarction (caudate, lentiform nuclei, internal capsule, insula, M1-M3 cortex): 7 - Supraganglionic infarction (M4-M6 cortex): 3 Total score (0-10 with 10 being normal): 10 IMPRESSION: 1. No acute intracranial hemorrhage. 2. ASPECTS is 10.  No CT evidence of acute cortical infarct. 3. Small dural base calcification along the right frontal convexity, possibly a small meningioma. Electronically Signed: By: Deatra Robinson M.D. On: 06/28/2016 23:38    Assessment/Plan  Recurrent Syncopal episode  Patient dose of Depakote was increased. Treat like seizures. Monitor LFTs and Depakote level. Patient has never had Holter monitor so d/w pat. Daughter. Will do Holter for 1 week to rule out arrhythmias.   Anemia. Hgb is 8.9 today. Down from 11.0 Will repeat in few days to rule out any active bleed. Hypothyrodism  TSH was 12.4   Synthroid was recently increased  to 50 mcg. Repeat TSH in 6 weeks.

## 2016-07-04 NOTE — Telephone Encounter (Signed)
[facility] #[phone] Fax: [phone]   

## 2016-07-05 ENCOUNTER — Other Ambulatory Visit (HOSPITAL_COMMUNITY)
Admission: AD | Admit: 2016-07-05 | Discharge: 2016-07-05 | Disposition: A | Discharge status: Home / Self Care | Payer: Medicare Other | Source: Skilled Nursing Facility | Attending: Internal Medicine | Admitting: Internal Medicine

## 2016-07-05 ENCOUNTER — Non-Acute Institutional Stay (SKILLED_NURSING_FACILITY): Payer: Medicare Other | Admitting: Internal Medicine

## 2016-07-05 DIAGNOSIS — E118 Type 2 diabetes mellitus with unspecified complications: Secondary | ICD-10-CM

## 2016-07-05 DIAGNOSIS — R509 Fever, unspecified: Secondary | ICD-10-CM

## 2016-07-05 DIAGNOSIS — N390 Urinary tract infection, site not specified: Secondary | ICD-10-CM | POA: Insufficient documentation

## 2016-07-05 DIAGNOSIS — R404 Transient alteration of awareness: Secondary | ICD-10-CM

## 2016-07-05 DIAGNOSIS — R4189 Other symptoms and signs involving cognitive functions and awareness: Secondary | ICD-10-CM

## 2016-07-05 LAB — URINALYSIS, ROUTINE W REFLEX MICROSCOPIC
BILIRUBIN URINE: NEGATIVE
Glucose, UA: 500 mg/dL — AB
Hgb urine dipstick: NEGATIVE
Leukocytes, UA: NEGATIVE
NITRITE: NEGATIVE
PH: 5.5 (ref 5.0–8.0)
Protein, ur: NEGATIVE mg/dL
SPECIFIC GRAVITY, URINE: 1.025 (ref 1.005–1.030)

## 2016-07-05 NOTE — Progress Notes (Signed)
This is an acute visit.  Level care skilled.  Facility [facility].  Complaint-acute visit follow-up hypoglycemia-recurrent syncope.-Low-grade temperature  History of present illness.  Patient is an 82 year old male seen today for a blood sugar of 60 last night-patient's blood sugars actually tender run somewhat high.  This is complicated with a history of recurrent syncope which actually led to an ER visit over the week--he was treated by neurology for possible seizures-previous EEG have been negative and stroke was ruled out in the hospital.  Patient usually responds to these episodes by lying in bed and having his feet elevated.  This is complicated with a left humeral fracture sustained on the weekend apparently is well he has seen orthopedics and is in a sling.  He is receiving Norco now for pain.  He also noted to have a low-grade temperature today 99.5 axillary nursing staff does not report any cough congestion or complaints of shortness of breath does not really complain of dysuria although again patient is a very poor historian secondary to dementia  Patient's energy level just has not been same since all this occurred over the weekend.  And apparently last night he ate poorly and blood sugar did drop.  He had been on Lantus 15 units daily at bedtime as well as NovoLog insulin before meals 15 units before lunch and 10 units before supper.  I was called about this last night and did hold his Lantus last night and reduce his NovoLog to 5 units before lunch and dinner.  Blood sugar overnight was in the mid 150s this was checked at noon as well as 3 AM this morning was in the 180s.  However at noon it had risen to over 300.  Patient is actually up in the wheelchair today appears somewhat tired but more alert than he appeared yesterday.  Vital signs appear to be stable systolic is somewhat elevated but this is not totally new and usually is not consistent  Past Medical  History:  Diagnosis Date  . Arthritis    "probably"  . Chronic kidney disease      . Dementia      . Elevated cholesterol   . Hypertension   . Type II diabetes mellitus (HCC)         Past Surgical History:  Procedure Laterality Date  . CATARACT EXTRACTION    . CORONARY ARTERY BYPASS GRAFT  ?1994   "CABG X3"  . TONSILLECTOMY      No Known Allergies        Current Outpatient Prescriptions on File Prior to Visit  Medication Sig Dispense Refill  . amLODipine (NORVASC) 10 MG tablet Take 10 mg by mouth daily.    [name] aspirin EC 81 MG tablet Take 81 mg by mouth daily.    . Cholecalciferol (VITAMIN D-3) 1000 UNITS CAPS Take 1,000 Units by mouth daily.    . divalproex (DEPAKOTE) 500 MG DR tablet Take 1 tablet (500 mg total) by mouth 2 (two) times daily. 60 tablet 1  . folic acid (FOLVITE) 1 MG tablet Take 1 mg by mouth daily.    . insulin glargine (LANTUS) 100 UNIT/ML injection Inject 15 Units into the skin at bedtime.     . insulin lispro (HUMALOG) 100 UNIT/ML injection Inject 10 Units into the skin 2 (two) times daily with a meal. With Lunch and Evening Meal    . levothyroxine (SYNTHROID, LEVOTHROID) 25 MCG tablet Take 2 tablets (50 mcg total) by mouth daily before breakfast.  60 tablet 1  . lisinopril (PRINIVIL,ZESTRIL) 40 MG tablet Take 40 mg by mouth daily.    . potassium chloride (KLOR-CON) 20 MEQ packet Take 20 mEq by mouth daily.      No current facility-administered medications on file prior to visit.     Review of Systems  Unable to perform ROS: Dementia -Please see history of present illness     No flowsheet data found. Functional Status Survey:    Temperature 99.5 axillary pulse 81 respirations 18 blood pressure 150/77 There is no height or weight on file to calculate BMI. Physical Exam    Physical Exam Constitutional: H Well-nourished elderly male in no distress sitting up in his wheelchair appears stronger than  when I saw him earlier in the week but still weak  Skin is warm and dry. He does have a small set of linear blisters lower left arm this does not give a herpes Zoster presentation there is no surrounding erythema or sign of cellulitis HENT:  Head: Normocephalicand atraumatic.   Eyes pupils appear reactive light sclera and conjunctiva are clear visual acuity appears grossly intact  Mouth/Throat: Oropharynx is clear and moist.   t.  Neck: Normal range of motion.  Cardiovascular: Normal rate, regular rhythmand normal heart sounds. With an occasional irregular or skipped beat  No murmurheard.--Has baseline lower extremity edema bilaterally Pulmonary/Chest: Effort normal. No respiratory distress. He exhibits no tenderness. Does have somewhat poor respiratory effort Abdominal: Soft. There is no tenderness. There is no guarding.  Musculoskeletal: Normal range of motion. He exhibits tendernessand deformity. The left upper arm it is in a sling   Radial pulses intact capillary refill intact he is able to move his fingers with some grip strength although appears to have some pain    Neurological: He is alertmoves all extremities--with exception of left arm which is in a sling-appears weak but neurologically grossly intact-cranial nerves intact    Labs reviewed: Recent Labs (within last 365 days)   Recent Labs  06/28/16 2305 07/01/16 1602 07/03/16 0710  NA 139 136 139  K 4.2 4.3 4.2  CL 102 104 103  CO2 25 23 26   GLUCOSE 117* 265* 267*  BUN 38* 22* 28*  CREATININE 1.96* 1.35* 1.26*  CALCIUM 8.9 8.8* 8.7*      Recent Labs (within last 365 days)   Recent Labs  02/16/16 0700 06/28/16 2305 07/01/16 1602  AST 17 19 25   ALT 15* 16* 5*  ALKPHOS 36* 43 46  BILITOT 0.8 0.3 0.7  PROT 6.1* 7.1 6.3*  ALBUMIN 3.1* 3.7 3.3*      Recent Labs (within last 365 days)   Recent Labs  06/28/16 2305 07/01/16 1602 07/03/16 0710  WBC 9.9 7.6 11.3*  NEUTROABS 5.6 5.8 7.6    HGB 12.0* 11.0* 8.9*  HCT 35.6* 32.5* 25.9*  MCV 96.0 94.8 94.5  PLT 195 183 171     Recent Labs       Lab Results  Component Value Date   TSH 12.424 (H) 06/28/2016     Recent Labs       Lab Results  Component Value Date   HGBA1C 9.1 (H) 05/30/2016     Recent Labs       Lab Results  Component Value Date   CHOL 189 12/23/2014   HDL 44 12/23/2014   LDLCALC 125 (H) 12/23/2014   TRIG 101 12/23/2014   CHOLHDL 4.3 12/23/2014      Significant Diagnostic Results in last 30 days:  Imaging Results  Dg Chest 1 View  Result Date: 07/01/2016 CLINICAL DATA:  Fall EXAM: CHEST 1 VIEW COMPARISON:  Portable exam 1603 hours compared to 03/16/2015 FINDINGS: Normal heart size, mediastinal contours, and pulmonary vascularity. Postsurgical changes of CABG. Atherosclerotic calcification aorta. Lungs clear. No pleural effusion or pneumothorax. Bones demineralized. Displaced oblique fracture of the proximal LEFT humerus again identified. IMPRESSION: No acute abnormalities. Post CABG. Aortic atherosclerosis. Electronically Signed   By: Ulyses Southward M.D.   On: 07/01/2016 16:34   Dg Pelvis 1-2 Views  Result Date: 07/01/2016 CLINICAL DATA:  Fall EXAM: PELVIS - 1-2 VIEW COMPARISON:  02/15/2016 FINDINGS: Diffuse osseous demineralization. BILATERAL hip joint space narrowing slightly greater on RIGHT. SI joints preserved. Degenerative disc disease changes at lower lumbar spine. No definite acute fracture, dislocation, or bone destruction. Scattered atherosclerotic calcifications. IMPRESSION: Osseous demineralization with mild degenerative changes of both hips and more advanced degenerative changes at the visualized lower lumbar spine. No acute osseous abnormalities. Electronically Signed   By: Ulyses Southward M.D.   On: 07/01/2016 16:33   Ct Head Wo Contrast  Result Date: 07/01/2016 CLINICAL DATA:  [facility] staff reports pt was discharged today from [facility] and when he arrived to [facility]  they noticed he had swelling, bruising, and blisters to left arm. They did an xray and found that pt has a humerus fracture. EXAM: CT HEAD WITHOUT CONTRAST TECHNIQUE: Contiguous axial images were obtained from the base of the skull through the vertex without intravenous contrast. COMPARISON:  06/28/2016 FINDINGS: Brain: There is significant central and cortical atrophy. Periventricular white matter changes are consistent with small vessel disease. An extra-axial calcified mass is identified adjacent to the right frontal bone measuring 8 mm and stable in appearance compared with prior study. There is no associated edema on, mass-effect, or hemorrhage. Vascular: Significant calcification of the internal carotid arteries. Skull: No calvarial fracture. Sinuses/Orbits: Significant opacification of the left sphenoid air cell and multiple bilateral ethmoid air cells. Mastoid air cells are normally aerated. Other: None IMPRESSION: 1. Atrophy and small vessel disease. 2.  No evidence for acute  abnormality. 3. Stable right frontal calcified mass measuring 8 mm. This may represent a small meningioma or meningeal calcification and appears stable. Electronically Signed   By: Norva Pavlov M.D.   On: 07/01/2016 16:59   Mr Laqueta Jean ZO Contrast  Result Date: 06/29/2016 CLINICAL DATA:  Confusion.  Left-sided weakness and slurred speech EXAM: MRI HEAD WITHOUT AND WITH CONTRAST TECHNIQUE: Multiplanar, multiecho pulse sequences of the brain and surrounding structures were obtained without and with intravenous contrast. CONTRAST:  9mL MULTIHANCE GADOBENATE DIMEGLUMINE 529 MG/ML IV SOLN COMPARISON:  CT head 06/28/2016.  MRI 10/04/2014 FINDINGS: Negative for acute infarct. Moderate atrophy. Mild chronic white matter changes. Small chronic infarct right paramedian pons. Normal cerebellum. Mild amount of hemosiderin staining along the cortex of the right frontal parietal lobe likely due to chronic mild subarachnoid hemorrhage. No  acute hemorrhage is seen on yesterday's CT. No fluid collection or mass-effect Negative for mass or edema. Postcontrast imaging demonstrates normal enhancement. Small dural base calcification high right frontal lobe does not enhance and may be dural calcification or small calcified meningioma. Postcontrast imaging is degraded by motion. Extensive mucosal edema in the paranasal sinuses with complete opacification of the left sphenoid sinus and extensive mucosal edema in the left ethmoid sinus. Mucosal edema in the maxillary sinuses left greater than right. This is chronic and unchanged from 2015. No orbital lesion identified.  Pituitary not  enlarged. IMPRESSION: Negative for acute infarct. Atrophy and mild chronic ischemic change Chronic sinusitis Electronically Signed   By: Marlan Palau M.D.   On: 06/29/2016 10:56   Dg Shoulder Left  Result Date: 07/01/2016 CLINICAL DATA:  Fall EXAM: LEFT SHOULDER - 2+ VIEW COMPARISON:  None FINDINGS: Osseous demineralization. AC joint alignment normal. Oblique fracture at surgical neck LEFT humerus extending down the proximal diaphysis, mildly displaced. No definite dislocation. No additional fracture or bone destruction seen. Visualized LEFT ribs intact. IMPRESSION: Displaced oblique fracture at surgical neck LEFT humerus extending into the proximal diaphysis. Electronically Signed   By: Ulyses Southward M.D.   On: 07/01/2016 16:54   Dg Humerus Left  Result Date: 07/01/2016 CLINICAL DATA:  Swelling, bruising and blisters on LEFT arm, recent discharge from hospital, humeral fracture EXAM: LEFT HUMERUS - 2+ VIEW COMPARISON:  None FINDINGS: Osseous demineralization. AC joint alignment normal. Elbow and glenohumeral joint alignments grossly normal Displaced oblique fracture of the surgical neck LEFT humerus extending a short way down the proximal shaft. No additional fracture or dislocation. Bony excrescence question osteochondroma at distal LEFT humeral metaphysis. IMPRESSION:  Displaced oblique fracture of the proximal LEFT humerus. 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 Electronically Signed   By: Ulyses Southward M.D.   On: 07/01/2016 16:32   Ct Head Code Stroke Wo Contrast`  Addendum Date: 06/29/2016   ADDENDUM REPORT: 06/29/2016 00:50 ADDENDUM: These results were called by telephone at the time of interpretation on 06/28/2016 at 11:35 p.m. to Dr. Devoria Albe , who verbally acknowledged these results. Electronically Signed   By: Deatra Robinson M.D.   On: 06/29/2016 00:50   Result Date: 06/29/2016 CLINICAL DATA:  Code stroke.  Sudden onset left-sided weakness EXAM: CT HEAD WITHOUT CONTRAST TECHNIQUE: Contiguous axial images were obtained from the base of the skull through the vertex without intravenous contrast. COMPARISON:  Head CT 10/02/2015 FINDINGS: Cortical gray-white differentiation is preserved. The basal ganglia and insular cortices are normal. No extra-axial collection, subarachnoid hemorrhage or intraparenchymal hematoma. No mass lesion, midline shift or hydrocephalus.B there is a small dural-based calcification along the right frontal convexity, possibly a small meningioma. Complete opacification of the left sphenoid sinus and posterior ethmoid air cells, unchanged. Mastoids are clear. Normal orbits. ASPECTS [facility] Stroke Program Early CT Score) - Ganglionic level infarction (caudate, lentiform nuclei, internal capsule, insula, M1-M3 cortex): 7 - Supraganglionic infarction (M4-M6 cortex): 3 Total score (0-10 with 10 being normal): 10 IMPRESSION: 1. No acute intracranial hemorrhage. 2. ASPECTS is 10.  No CT evidence of acute cortical infarct. 3. Small dural base calcification along the right frontal convexity, possibly a small meningioma. Electronically Signed: By: Deatra Robinson M.D. On: 06/28/2016 23:38    Assessment and plan. [name]  #1-diabetes type 2-secondary to the low blood sugar last night would like to avoid hypoglycemia if at all possible-will reduce  his p.m. Lantus down to 10 units daily at bedtime-also will reduce his NovoLog before lunch and dinner down to 5 units-continue to monitor he may run a bit high we may have to adjust this but would like to make sure his appetite is stabilized before doing this.  #2 history of syncope-as noted above his Depakote has been increased he still appears weaker than his baseline--Dr. Chales Abrahams has assessed him and ordered a Holter monitor which has been placed will await those findings-this is to rule out arrhythmia.  #3 history of low-grade temperature-will order a UA C&S nursing staff does not report any increased cough  or shortness of breath lung exam is benign.  Will start by ordering a urinalysis and culture he does have an update CBC pending on Monday, 07/09/2016-at this point monitor closely  Addendum.  Later this afternoon patient apparently had another syncopal-type episode he was placed in bed his legs elevated by the time I arrived in the room he was alert responsive making eye contact--moving his extremities at his baseline.  Vital signs were stable systolic blood pressure was in the 140s pulse was regular rate and rhythm CBG was actually 336 again we will be monitoring this.   [id number]-of note greater than 35 minutes spent assessing patient-reassessing patient-reviewing his chart-his labs-discussing his status with nursing as well as with Dr. Jethro PolingGupta-and coordinating and formulating a plan of care-of note greater than 50% of time spent coordinating plan of care

## 2016-07-06 ENCOUNTER — Encounter (HOSPITAL_COMMUNITY)
Admission: RE | Admit: 2016-07-06 | Discharge: 2016-07-06 | Disposition: A | Discharge status: Home / Self Care | Payer: Medicare Other | Source: Skilled Nursing Facility | Attending: *Deleted | Admitting: *Deleted

## 2016-07-06 ENCOUNTER — Non-Acute Institutional Stay (SKILLED_NURSING_FACILITY): Payer: Medicare Other | Admitting: Internal Medicine

## 2016-07-06 DIAGNOSIS — I10 Essential (primary) hypertension: Secondary | ICD-10-CM | POA: Diagnosis not present

## 2016-07-06 DIAGNOSIS — N289 Disorder of kidney and ureter, unspecified: Secondary | ICD-10-CM | POA: Diagnosis not present

## 2016-07-06 DIAGNOSIS — R55 Syncope and collapse: Secondary | ICD-10-CM

## 2016-07-06 DIAGNOSIS — E118 Type 2 diabetes mellitus with unspecified complications: Secondary | ICD-10-CM | POA: Diagnosis not present

## 2016-07-06 DIAGNOSIS — E119 Type 2 diabetes mellitus without complications: Secondary | ICD-10-CM | POA: Diagnosis not present

## 2016-07-06 DIAGNOSIS — E1165 Type 2 diabetes mellitus with hyperglycemia: Secondary | ICD-10-CM

## 2016-07-06 LAB — BASIC METABOLIC PANEL
ANION GAP: 10 (ref 5–15)
BUN: 49 mg/dL — ABNORMAL HIGH (ref 6–20)
CALCIUM: 8.8 mg/dL — AB (ref 8.9–10.3)
CO2: 27 mmol/L (ref 22–32)
Chloride: 101 mmol/L (ref 101–111)
Creatinine, Ser: 1.39 mg/dL — ABNORMAL HIGH (ref 0.61–1.24)
GFR, EST AFRICAN AMERICAN: 53 mL/min — AB (ref >60)
GFR, EST NON AFRICAN AMERICAN: 46 mL/min — AB (ref >60)
Glucose, Bld: 282 mg/dL — ABNORMAL HIGH (ref 65–99)
POTASSIUM: 4.2 mmol/L (ref 3.5–5.1)
Sodium: 138 mmol/L (ref 135–145)

## 2016-07-06 LAB — CBC
HEMATOCRIT: 25.8 % — AB (ref 39.0–52.0)
HEMOGLOBIN: 8.7 g/dL — AB (ref 13.0–17.0)
MCH: 32.2 pg (ref 26.0–34.0)
MCHC: 33.7 g/dL (ref 30.0–36.0)
MCV: 95.6 fL (ref 78.0–100.0)
Platelets: 225 10*3/uL (ref 150–400)
RBC: 2.7 MIL/uL — AB (ref 4.22–5.81)
RDW: 13.2 % (ref 11.5–15.5)
WBC: 8.6 10*3/uL (ref 4.0–10.5)

## 2016-07-07 LAB — URINE CULTURE: Culture: NO GROWTH

## 2016-07-07 NOTE — Progress Notes (Signed)
This is an acute visit.  Level care skilled.  Facility [facility].  Chief complaint-acute visit follow-up renal insufficiency-low-grade temperature-history of syncopal episodes.  History of present illness.  Patient is a pleasant 82 year old male who has had a complicated course here recently.  Over the weekend he had an unresponsive episode and went to the ER-workup did not really show any acute process but there was some suspicion it may be seizure related and his Depakote was increased.  Also over the weekend he apparently sustained a left humerus fracture has seen orthopedics he now has the arm in a sling-pain is being controlled with Norco-we have been trying to minimize its use however secondary to patient sedation concerns mental status concerns.  I saw him yesterday it was shortly after he had another syncopal-type episode but by day time I saw him he was resting in bed comfortably and was alert.  He is having a better day today he appears to be more alert does talk some makes eye contact.  We did order a urinalysis secondary to a low-grade temperature which does persist at times however urinalysis so far looks fairly benign.  He does not show signs of any respiratory issues cough congestion.  Dr. Chales AbrahamsGupta did order Holter monitor to rule out any arrhythmia causing his episodes  Lab work has returned and shows his creatinine is up a bit from his baseline with a BUN of 49 and creatinine of 1.39-.  He also is a type II diabetic and had an episode of suspected hypoglycemia earlier this week with a blood sugar in the 60s we have reduced the NovoLog before meals at lunch and supper down to 5 units-also have reduced his Lantus from 15 units down to 10 units-blood sugars someone elevated today in the 2 300 range I suspect we will be titrating this accordingly if his appetite improves.  Past Medical History:  Diagnosis Date  . Arthritis    "probably"  . Chronic kidney disease       . Dementia      . Elevated cholesterol   . Hypertension   . Type II diabetes mellitus (HCC)         Past Surgical History:  Procedure Laterality Date  . CATARACT EXTRACTION    . CORONARY ARTERY BYPASS GRAFT  ?1994   "CABG X3"  . TONSILLECTOMY      No Known Allergies        Current Outpatient Prescriptions on File Prior to Visit  Medication Sig Dispense Refill  . amLODipine (NORVASC) 10 MG tablet Take 10 mg by mouth daily.    [name]. aspirin EC 81 MG tablet Take 81 mg by mouth daily.    . Cholecalciferol (VITAMIN D-3) 1000 UNITS CAPS Take 1,000 Units by mouth daily.    . divalproex (DEPAKOTE) 500 MG DR tablet Take 1 tablet (500 mg total) by mouth 2 (two) times daily. 60 tablet 1  . folic acid (FOLVITE) 1 MG tablet Take 1 mg by mouth daily.    . insulin glargine (LANTUS) 100 UNIT/ML injection Inject 10 Units into the skin at bedtime.     . insulin lispro (HUMALOG) 100 UNIT/ML injection Inject 5 Units into the skin 2 (two) times daily with a meal. With Lunch and Evening Meal    . levothyroxine (SYNTHROID, LEVOTHROID) 25 MCG tablet Take 2 tablets (50 mcg total) by mouth daily before breakfast. 60 tablet 1  . lisinopril (PRINIVIL,ZESTRIL) 40 MG tablet Take 40 mg by mouth daily.    .[name]  potassium chloride (KLOR-CON) 20 MEQ packet Take 20 mEq by mouth daily.      No current facility-administered medications on file prior to visit.     Review of Systems  Unable to perform ROS: Dementia  Please see history of present illness  No flowsheet data found. Functional Status Survey:     There is no height or weight on file to calculate BMI. Physical Exam  Constitutional: He appears well-developed and well-nourished. Lying in bed comfortably is talking some appears a bit more like himself HENT:  Head: Normocephalic.  Mouth/Throat: Oropharynx is clear and moist.  Cardiovascular: Normal rate, regular rhythm and normal heart sounds.   has baseline  edema Pulmonary/Chest: Breath sounds normal. No respiratory distress. He has no wheezes. He has no rales.  Abdominal: Soft. Bowel sounds are normal. He exhibits no distension. There is no tenderness. There is no rebound.  Neurological: He is alert. Moves extremities at baseline  No oriented to time place or person. Left arm in sling due to Fracture is healed pulses intact he is able to grip somewhat this left hand although appears to have a bit of discomfort .    Labs.  07/05/2016.  Sodium 138 potassium 4.2 BUN 49 creatinine 1.39.  Previous creatinine 1.26 although at one point it had been as high as 1.9 on recent hospital admission.  WBC 8.6 hemoglobin 8.7 platelets 225.  Assessment and plan.  #1-history of mild renal insufficiency-patient would not we have very good candidate for IV fluids secondary to agitation-this was discussed with Dr. Chales Abrahams as well as with nursing and will push fluids aggressively and monitor check a BMP on Monday, September 11.  #2 history of syncopal-type episodes-he has not had one so far today these appear to be becoming less frequent-hopefully this will continue-he does have a Holter monitor applied-vital signs appear to be relatively stable at times has a somewhat elevated systolic but will monitor for now he does continue on Norvasc.  #3 diabetes type 2-again we have been somewhat more conservative here secondary to his poor appetite and low blood sugars occasionally which appears to have improved now running on the higher side but would like to have more consistency in appetite before titrating any insulin back up.  #4 history of left humerus fracture-apparently Tylenol is helping-nursing staff is trying to minimize Norco use secondary to concerns of possible sedation or increased confusion-so far this appears to be effective.  #5 low-grade temperature-again he does not appear to be septic in any fashion-will await final urine culture results respiratory  status appears to be stable   His white count is within normal range  [id number]

## 2016-07-08 ENCOUNTER — Non-Acute Institutional Stay (SKILLED_NURSING_FACILITY): Payer: Medicare Other | Admitting: Internal Medicine

## 2016-07-08 DIAGNOSIS — R509 Fever, unspecified: Secondary | ICD-10-CM | POA: Diagnosis not present

## 2016-07-08 DIAGNOSIS — I10 Essential (primary) hypertension: Secondary | ICD-10-CM

## 2016-07-08 DIAGNOSIS — S42302D Unspecified fracture of shaft of humerus, left arm, subsequent encounter for fracture with routine healing: Secondary | ICD-10-CM | POA: Diagnosis not present

## 2016-07-08 DIAGNOSIS — E1165 Type 2 diabetes mellitus with hyperglycemia: Secondary | ICD-10-CM | POA: Diagnosis not present

## 2016-07-08 DIAGNOSIS — E118 Type 2 diabetes mellitus with unspecified complications: Secondary | ICD-10-CM | POA: Diagnosis not present

## 2016-07-08 DIAGNOSIS — R55 Syncope and collapse: Secondary | ICD-10-CM | POA: Diagnosis not present

## 2016-07-08 NOTE — Progress Notes (Signed)
This is an acute visit.  Level of care skilled  Facility [facility].  Chief complaint-acute acute visit secondary low-grade temperature elevation CBGs.  History of present illness.  Patient is a pleasant 82 year old male who has a comp. Medical history.  Recently had a series of recurrent syncopal-type episodes thought possibly to be seizure activity his typical was increased last few days this appears to have stabilized with no recent episodes which is encouraging. Dr. Chales Abrahams did assess him as ordered a Holter monitor which is currently applied-this is to rule out any possible arrhythmia as possibly a cause of his episodes  He also has a left humerus fracture that was sustained approximately a week ago he is followed by orthopedics currently has his arm in a sling.  Patient does have a low-grade temperature now for several days-we did do a urinalysis and culture which appears fairly benign.  Lab work also was fairly benign with a normal white count.  Apparently overnight he did have a temperature 100.5 nursing staff does not report any cough congestion or increased shortness of breath.  He also is a type II diabetic we did reduce his Lantus as well as Humalog before meals secondary to concerns-however his blood sugars now. Be 30 consistently elevated-she is doing better he is eating better recent morning blood sugars have been in the mid 200s 300 range-later in the day largely in the 300s range.  Currently he is resting in bed comfortably he is more alert more talkative appears to be more his baseline which is encouraging  Past Medical History:  Diagnosis Date  . Arthritis    "probably"  . Chronic kidney disease      . Dementia      . Elevated cholesterol   . Hypertension   . Type II diabetes mellitus (HCC)         Past Surgical History:  Procedure Laterality Date  . CATARACT EXTRACTION    . CORONARY ARTERY BYPASS GRAFT  ?1994   "CABG X3"  .  TONSILLECTOMY      No Known Allergies        Current Outpatient Prescriptions on File Prior to Visit  Medication Sig Dispense Refill  . amLODipine (NORVASC) 10 MG tablet Take 10 mg by mouth daily.    [name] aspirin EC 81 MG tablet Take 81 mg by mouth daily.    . Cholecalciferol (VITAMIN D-3) 1000 UNITS CAPS Take 1,000 Units by mouth daily.    . divalproex (DEPAKOTE) 500 MG DR tablet Take 1 tablet (500 mg total) by mouth 2 (two) times daily. 60 tablet 1  . folic acid (FOLVITE) 1 MG tablet Take 1 mg by mouth daily.    . insulin glargine (LANTUS) 100 UNIT/ML injection Inject 10 Units into the skin at bedtime.     . insulin lispro (HUMALOG) 100 UNIT/ML injection Inject 5 Units into the skin 2 (two) times daily with a meal. With Lunch and Evening Meal    . levothyroxine (SYNTHROID, LEVOTHROID) 25 MCG tablet Take 2 tablets (50 mcg total) by mouth daily before breakfast. 60 tablet 1  . lisinopril (PRINIVIL,ZESTRIL) 40 MG tablet Take 40 mg by mouth daily.    . potassium chloride (KLOR-CON) 20 MEQ packet Take 20 mEq by mouth daily.      No current facility-administered medications on file prior to visit.     Review of Systems  Unable to perform [id number] Please see history of present illness When asked he says he is not  having any pain  No flowsheet data found. Functional Status Survey:   Temperature 98.9 pulse 80 respirations 20 blood pressure 138/70 O2 saturation 98% on room air blood pressures appear systolically in the 140 to 160s area I. Physical Exam Constitutional: He appears well-developedand well-nourished. Lying in bed comfortably is talkingMore than before this the most he ihas talked since he's had his episodes HENT:  Head: Normocephalic.  Mouth/Throat: Oropharynx is clear and moist.  Cardiovascular: Normal rate, regular rhythmand normal heart sounds.  has baseline edema Pulmonary/Chest: Breath sounds normal. No respiratory distress. He  has no wheezes. He has no rales. Somewhat poor respiratory effort  Abdominal: Soft. Bowel sounds are normal. He exhibits no distension. There is no tenderness. There is no rebound.  Neurological: He is alert. Moves extremities at baseline  No oriented to time place or person. Left arm in sling due to Fracture is healed pulses intact he is able to grip somewhat this left hand although appears to have a bit of discomfort-- .   Labs.  07/06/2016.  Sodium 138 potassium 4.2 BUN 49 creatinine 1.39.  Previous creatinine 1.26 although at one point it had been as high as 1.9 on recent hospital admission.  WBC 8.6 hemoglobin 8.7 platelets 225.  Assessment and plan.  #1-history of mild renal insufficiency-patient would not we have very good candidate for IV fluids secondary to agitation-this was discussed with Dr. Chales AbrahamsGupta as well as with nursing and will push fluids aggressively and monitor check a BMP on Monday, September 11.--Per nursing staff he is eating and drinking better  #2 history of syncopal-type episodes-he has not had one so far today these appear to be becoming less frequent-hopefully this will continue-he does have a Holter monitor applied-vital signs appear to be relatively stable at times has a somewhat elevated systolic but will monitor for now he does continue on Norvasc.  #3 diabetes type 2-again we have been somewhat more conservative here secondary to his poor appetite and low blood sugars occasionally which appears to have improved now running On the fairly high side with frequent readings in 300s-will increase his Lantus back up to 13 units from the 10 units and monitor will be somewhat conservative titrating this up until he is consistently eating and drinking better and appears to be fully at his baseline.  #4 history of left humerus fracture-apparently Tylenol is helping-nursing staff is trying to minimize Norco use secondary to concerns of possible sedation or  increased confusion-this appears to be effective.--He denies pain today  #5 low-grade temperature- Urine culture did not grow out anything significant and he does not appear to be febrile were any discomfort in this regards-nonetheless he continues to have a low-grade temperature at times will check a chest x-ray--and continue to monitor closely.  At this point continue to monitor.  #6-hypertension-he is on Norvasc 5 mg daily lisinopril 40 mg a day-at this point will monitor--I got a systolic in the high 130s manually today-at this point would be hesitant to add more medication with patient having possible acute issues as noted recently would like to see what hisblood pressure runs once things settle down his blood pressure generally has been lower  He does have lab work scheduled for tomorrow we'll see what his white count is running although again this appears to be normal now.  [id number] of note greater than 35 minutes spent assessing patient-discussing his status with nursing staff-reviewing his chart-reviewing his labs-and coordinating and formulating a plan of care for numerous diagnoses-of  note greater than 50% of time spent coordinating plan of care--

## 2016-07-09 ENCOUNTER — Encounter: Payer: Self-pay | Admitting: Internal Medicine

## 2016-07-09 ENCOUNTER — Non-Acute Institutional Stay (SKILLED_NURSING_FACILITY): Payer: Medicare Other | Admitting: Internal Medicine

## 2016-07-09 ENCOUNTER — Other Ambulatory Visit (HOSPITAL_COMMUNITY)
Admission: RE | Admit: 2016-07-09 | Discharge: 2016-07-09 | Disposition: A | Discharge status: Home / Self Care | Payer: Medicare Other | Source: Skilled Nursing Facility | Attending: Internal Medicine | Admitting: Internal Medicine

## 2016-07-09 DIAGNOSIS — R509 Fever, unspecified: Secondary | ICD-10-CM | POA: Diagnosis not present

## 2016-07-09 DIAGNOSIS — E1165 Type 2 diabetes mellitus with hyperglycemia: Secondary | ICD-10-CM | POA: Diagnosis not present

## 2016-07-09 DIAGNOSIS — E119 Type 2 diabetes mellitus without complications: Secondary | ICD-10-CM | POA: Insufficient documentation

## 2016-07-09 DIAGNOSIS — D649 Anemia, unspecified: Secondary | ICD-10-CM

## 2016-07-09 DIAGNOSIS — E118 Type 2 diabetes mellitus with unspecified complications: Secondary | ICD-10-CM

## 2016-07-09 DIAGNOSIS — N189 Chronic kidney disease, unspecified: Secondary | ICD-10-CM

## 2016-07-09 LAB — BASIC METABOLIC PANEL
Anion gap: 8 (ref 5–15)
BUN: 48 mg/dL — AB (ref 6–20)
CHLORIDE: 104 mmol/L (ref 101–111)
CO2: 27 mmol/L (ref 22–32)
CREATININE: 1.45 mg/dL — AB (ref 0.61–1.24)
Calcium: 8.3 mg/dL — ABNORMAL LOW (ref 8.9–10.3)
GFR calc Af Amer: 50 mL/min — ABNORMAL LOW (ref >60)
GFR calc non Af Amer: 43 mL/min — ABNORMAL LOW (ref >60)
Glucose, Bld: 240 mg/dL — ABNORMAL HIGH (ref 65–99)
POTASSIUM: 4.3 mmol/L (ref 3.5–5.1)
SODIUM: 139 mmol/L (ref 135–145)

## 2016-07-09 LAB — CBC WITH DIFFERENTIAL/PLATELET
Basophils Absolute: 0 10*3/uL (ref 0.0–0.1)
Basophils Relative: 0 %
EOS PCT: 2 %
Eosinophils Absolute: 0.2 10*3/uL (ref 0.0–0.7)
HCT: 24.8 % — ABNORMAL LOW (ref 39.0–52.0)
HEMOGLOBIN: 8.2 g/dL — AB (ref 13.0–17.0)
LYMPHS ABS: 1.3 10*3/uL (ref 0.7–4.0)
LYMPHS PCT: 14 %
MCH: 32.4 pg (ref 26.0–34.0)
MCHC: 33.1 g/dL (ref 30.0–36.0)
MCV: 98 fL (ref 78.0–100.0)
MONOS PCT: 12 %
Monocytes Absolute: 1.1 10*3/uL — ABNORMAL HIGH (ref 0.1–1.0)
Neutro Abs: 6.7 10*3/uL (ref 1.7–7.7)
Neutrophils Relative %: 72 %
Platelets: 342 10*3/uL (ref 150–400)
RBC: 2.53 MIL/uL — AB (ref 4.22–5.81)
RDW: 13.7 % (ref 11.5–15.5)
WBC: 9.3 10*3/uL (ref 4.0–10.5)

## 2016-07-09 NOTE — Progress Notes (Signed)
Location:   [facility] Room Number: 104/D Place of Service:  SNF [phone]) Provider:  Keene Breath, NP  Patient Care Team: Marletta Lor, NP as PCP - General (Nurse Practitioner)  Extended Emergency Contact Information Primary Emergency Contact: Welborne,[name] Address: [address] [place], [place] [address] [name] Home Phone: [phone] Mobile Phone: [phone] Relation: Daughter Secondary Emergency Contact: Gibson Ramp Address: [address]          [place], [place] [address] [name] Home Phone: [phone] Mobile Phone: [phone] Relation: Relative  Code Status:  DNR Goals of care: Advanced Directive information Advanced Directives 07/09/2016  Does patient have an advance directive? Yes  Type of Advance Directive Out of facility DNR (pink MOST or yellow form)  Does patient want to make changes to advanced directive? No - Patient declined  Copy of advanced directive(s) in chart? Yes  Pre-existing out of facility DNR order (yellow form or pink MOST form) -     Chief Complaint  Patient presents with  . Acute Visit  To follow-up multiple issues including low-grade temperature-renal insufficiency-anemia-diabetes 2  HPI:  Pt is a 82 y.o. male seen today for an acute visit for follow-up of above issues.  Patient has had a complicated course recently--with a recent series of recurrent syncopal-type episodes thought possibly be seizure type his Depakote was increased-Dr. Chales Abrahams did assess him and ordered a Holter monitor as well he has just completed a weeklong course of this and awaiting the results.  He also has a left humerus fracture that is followed by orthopedics-he did not do very well with his Norco he is on Tylenol and apparently this is helping some.  He also at times has a low-grade temperature 99.5 today-we did order a urine culture which was negative-we ordered a chest x-ray  yesterday which was negative for any acute process as well.  Is also a type II diabetic we reduced his Lantus secondary to concerns of hypoglycemia with his poor appetite and low blood sugars at times-however sugars now appear to be moderately to 300 range and we just increased his Lantus from 10 units up to 13 units she's also Humalog 5 units with lunch and dinner if his CBGs greater than 150-blood sugars still are somewhat elevated above 200 but we have just made medication changes and would like to give this a little more time.  Also would like to make sure his appetite consistently improves.  Clinically he appears to be doing better he is more alert more talkative more at his baseline although still somewhat weaker than when he was previously.  We have also been following his renal function he does have a history of some chronic renal insufficiency creatinine has risen somewhat above his baseline to 1.45-nursing staff is encouraging fluids he would be a poor candidate with diabetes apparently this would be difficult to keep in secondary to patient's dementia and agitation.  He also has a history of anemia most likely chronic disease hemoglobin of 8.2 is on the lower end of his baseline-this is thought to have an element of chronicity-will keep an eye on this periodically as well.     Past Medical History:  Diagnosis Date  . Arthritis    "probably"  . Chronic kidney disease    "? stage" (08/18/2014)  . Dementia    "don't know stage or type" (08/18/2014)  . Elevated  cholesterol   . Hypertension   . Type II diabetes mellitus (HCC)    Past Surgical History:  Procedure Laterality Date  . CATARACT EXTRACTION    . CORONARY ARTERY BYPASS GRAFT  ?1994   "CABG X3"  . TONSILLECTOMY      No Known Allergies  Current Outpatient Prescriptions on File Prior to Visit  Medication Sig Dispense Refill  . acetaminophen (TYLENOL) 325 MG tablet Take 650 mg by mouth every 6 (six) hours as needed.     [name] amLODipine (NORVASC) 10 MG tablet Take 10 mg by mouth daily.    [name] aspirin EC 81 MG tablet Take 81 mg by mouth daily.    . Cholecalciferol (VITAMIN D-3) 1000 UNITS CAPS Take 1,000 Units by mouth daily.    . divalproex (DEPAKOTE) 500 MG DR tablet Take 1 tablet (500 mg total) by mouth 2 (two) times daily. 60 tablet 1  . folic acid (FOLVITE) 1 MG tablet Take 1 mg by mouth daily.    . insulin glargine (LANTUS) 100 UNIT/ML injection Inject 13 Units into the skin at bedtime.     . insulin lispro (HUMALOG) 100 UNIT/ML injection Inject 5 Units into the skin 2 (two) times daily with a meal. With Lunch and Evening Meal    . levothyroxine (SYNTHROID, LEVOTHROID) 25 MCG tablet Take 2 tablets (50 mcg total) by mouth daily before breakfast. 60 tablet 1  . lisinopril (PRINIVIL,ZESTRIL) 40 MG tablet Take 40 mg by mouth daily.    . potassium chloride (KLOR-CON) 20 MEQ packet Take 20 mEq by mouth daily.      No current facility-administered medications on file prior to visit.      Review of Systems   This is very limited secondary to dementia please see history of present illness again he is more alert talking more does not complaining specifically of pain today which is encouraging  Immunization History  Administered Date(s) Administered  . Influenza-Unspecified 05/29/2014  . PPD Test 11/01/2013, 10/18/2014  . Pneumococcal-Unspecified 11/22/1998   Pertinent  Health Maintenance Due  Topic Date Due  . INFLUENZA VACCINE  09/28/2016 (Originally 05/29/2016)  . PNA vac Low Risk Adult (2 of 2 - PCV13) 10/29/2016 (Originally 11/23/1999)  . OPHTHALMOLOGY EXAM  08/07/2016  . FOOT EXAM  10/13/2016  . HEMOGLOBIN A1C  11/30/2016  . URINE MICROALBUMIN  05/29/2017   No flowsheet data found. Functional Status Survey:    Vitals:   07/09/16 1553  BP: 132/68  Pulse: 72  Resp: 20  Temp: 98.5 F (36.9 C)  TempSrc: Oral  SpO2: 98%   There is no height or weight on file to calculate BMI. Physical  Exam Constitutional: He appears well-developedand well-nourished. Lying in bed comfortably i HENT:  Head: Normocephalic.  Mouth/Throat: Oropharynx is clear and moist.  Cardiovascular: Normal rate, regular rhythmand normal heart sounds. has baseline edema Pulmonary/Chest: Breath sounds normal. No respiratory distress. He has no wheezes. He has no rales. Somewhat poor respiratory effort  Abdominal: Soft. Bowel sounds are normal. He exhibits no distension. There is no tenderness. There is no rebound.  Neurological: He is alert. Moves extremities at baseline  No oriented to time place or person.  Left arm especially upper arm continues to have some swelling but this appears to be gradually improving-radial pulses are intact bilaterally this is somewhat reduced on the left secondary I suspect of pain and the recent humerus fracture   Labs reviewed:  Recent Labs  07/03/16 0710 07/06/16 0755 07/09/16 0625  NA  139 138 139  K 4.2 4.2 4.3  CL 103 101 104  CO2 26 27 27   GLUCOSE 267* 282* 240*  BUN 28* 49* 48*  CREATININE 1.26* 1.39* 1.45*  CALCIUM 8.7* 8.8* 8.3*    Recent Labs  02/16/16 0700 06/28/16 2305 07/01/16 1602  AST 17 19 25   ALT 15* 16* 5*  ALKPHOS 36* 43 46  BILITOT 0.8 0.3 0.7  PROT 6.1* 7.1 6.3*  ALBUMIN 3.1* 3.7 3.3*    Recent Labs  07/01/16 1602 07/03/16 0710 07/06/16 0755 07/09/16 0625  WBC 7.6 11.3* 8.6 9.3  NEUTROABS 5.8 7.6  --  6.7  HGB 11.0* 8.9* 8.7* 8.2*  HCT 32.5* 25.9* 25.8* 24.8*  MCV 94.8 94.5 95.6 98.0  PLT 183 171 225 342   Lab Results  Component Value Date   TSH 12.424 (H) 06/28/2016   Lab Results  Component Value Date   HGBA1C 9.1 (H) 05/30/2016   Lab Results  Component Value Date   CHOL 189 12/23/2014   HDL 44 12/23/2014   LDLCALC 125 (H) 12/23/2014   TRIG 101 12/23/2014   CHOLHDL 4.3 12/23/2014    Significant Diagnostic Results in last 30 days:  Dg Chest 1 View  Result Date: 07/01/2016 CLINICAL DATA:  Fall EXAM:  CHEST 1 VIEW COMPARISON:  Portable exam 1603 hours compared to 03/16/2015 FINDINGS: Normal heart size, mediastinal contours, and pulmonary vascularity. Postsurgical changes of CABG. Atherosclerotic calcification aorta. Lungs clear. No pleural effusion or pneumothorax. Bones demineralized. Displaced oblique fracture of the proximal LEFT humerus again identified. IMPRESSION: No acute abnormalities. Post CABG. Aortic atherosclerosis. Electronically Signed   By: Ulyses Southward M.D.   On: 07/01/2016 16:34   Dg Pelvis 1-2 Views  Result Date: 07/01/2016 CLINICAL DATA:  Fall EXAM: PELVIS - 1-2 VIEW COMPARISON:  02/15/2016 FINDINGS: Diffuse osseous demineralization. BILATERAL hip joint space narrowing slightly greater on RIGHT. SI joints preserved. Degenerative disc disease changes at lower lumbar spine. No definite acute fracture, dislocation, or bone destruction. Scattered atherosclerotic calcifications. IMPRESSION: Osseous demineralization with mild degenerative changes of both hips and more advanced degenerative changes at the visualized lower lumbar spine. No acute osseous abnormalities. Electronically Signed   By: Ulyses Southward M.D.   On: 07/01/2016 16:33   Ct Head Wo Contrast  Result Date: 07/01/2016 CLINICAL DATA:  [facility] staff reports pt was discharged today from [facility] and when he arrived to [facility] they noticed he had swelling, bruising, and blisters to left arm. They did an xray and found that pt has a humerus fracture. EXAM: CT HEAD WITHOUT CONTRAST TECHNIQUE: Contiguous axial images were obtained from the base of the skull through the vertex without intravenous contrast. COMPARISON:  06/28/2016 FINDINGS: Brain: There is significant central and cortical atrophy. Periventricular white matter changes are consistent with small vessel disease. An extra-axial calcified mass is identified adjacent to the right frontal bone measuring 8 mm and stable in appearance compared with prior study. There is no associated  edema on, mass-effect, or hemorrhage. Vascular: Significant calcification of the internal carotid arteries. Skull: No calvarial fracture. Sinuses/Orbits: Significant opacification of the left sphenoid air cell and multiple bilateral ethmoid air cells. Mastoid air cells are normally aerated. Other: None IMPRESSION: 1. Atrophy and small vessel disease. 2.  No evidence for acute  abnormality. 3. Stable right frontal calcified mass measuring 8 mm. This may represent a small meningioma or meningeal calcification and appears stable. Electronically Signed   By: Norva Pavlov M.D.   On: 07/01/2016 16:59  Mr Laqueta JeanBrain W Wo Contrast  Result Date: 06/29/2016 CLINICAL DATA:  Confusion.  Left-sided weakness and slurred speech EXAM: MRI HEAD WITHOUT AND WITH CONTRAST TECHNIQUE: Multiplanar, multiecho pulse sequences of the brain and surrounding structures were obtained without and with intravenous contrast. CONTRAST:  9mL MULTIHANCE GADOBENATE DIMEGLUMINE 529 MG/ML IV SOLN COMPARISON:  CT head 06/28/2016.  MRI 10/04/2014 FINDINGS: Negative for acute infarct. Moderate atrophy. Mild chronic white matter changes. Small chronic infarct right paramedian pons. Normal cerebellum. Mild amount of hemosiderin staining along the cortex of the right frontal parietal lobe likely due to chronic mild subarachnoid hemorrhage. No acute hemorrhage is seen on yesterday's CT. No fluid collection or mass-effect Negative for mass or edema. Postcontrast imaging demonstrates normal enhancement. Small dural base calcification high right frontal lobe does not enhance and may be dural calcification or small calcified meningioma. Postcontrast imaging is degraded by motion. Extensive mucosal edema in the paranasal sinuses with complete opacification of the left sphenoid sinus and extensive mucosal edema in the left ethmoid sinus. Mucosal edema in the maxillary sinuses left greater than right. This is chronic and unchanged from 2015. No orbital lesion  identified.  Pituitary not enlarged. IMPRESSION: Negative for acute infarct. Atrophy and mild chronic ischemic change Chronic sinusitis Electronically Signed   By: Marlan Palauharles  Clark M.D.   On: 06/29/2016 10:56   Dg Shoulder Left  Result Date: 07/01/2016 CLINICAL DATA:  Fall EXAM: LEFT SHOULDER - 2+ VIEW COMPARISON:  None FINDINGS: Osseous demineralization. AC joint alignment normal. Oblique fracture at surgical neck LEFT humerus extending down the proximal diaphysis, mildly displaced. No definite dislocation. No additional fracture or bone destruction seen. Visualized LEFT ribs intact. IMPRESSION: Displaced oblique fracture at surgical neck LEFT humerus extending into the proximal diaphysis. Electronically Signed   By: Ulyses SouthwardMark  Boles M.D.   On: 07/01/2016 16:54   Dg Humerus Left  Result Date: 07/01/2016 CLINICAL DATA:  Swelling, bruising and blisters on LEFT arm, recent discharge from hospital, humeral fracture EXAM: LEFT HUMERUS - 2+ VIEW COMPARISON:  None FINDINGS: Osseous demineralization. AC joint alignment normal. Elbow and glenohumeral joint alignments grossly normal Displaced oblique fracture of the surgical neck LEFT humerus extending a short way down the proximal shaft. No additional fracture or dislocation. Bony excrescence question osteochondroma at distal LEFT humeral metaphysis. IMPRESSION: Displaced oblique fracture of the proximal LEFT humerus. 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 Electronically Signed   By: Ulyses SouthwardMark  Boles M.D.   On: 07/01/2016 16:32   Ct Head Code Stroke Wo Contrast`  Addendum Date: 06/29/2016   ADDENDUM REPORT: 06/29/2016 00:50 ADDENDUM: These results were called by telephone at the time of interpretation on 06/28/2016 at 11:35 p.m. to Dr. Devoria AlbeIva Knapp , who verbally acknowledged these results. Electronically Signed   By: Deatra RobinsonKevin  Herman M.D.   On: 06/29/2016 00:50   Result Date: 06/29/2016 CLINICAL DATA:  Code stroke.  Sudden onset left-sided weakness EXAM: CT HEAD  WITHOUT CONTRAST TECHNIQUE: Contiguous axial images were obtained from the base of the skull through the vertex without intravenous contrast. COMPARISON:  Head CT 10/02/2015 FINDINGS: Cortical gray-white differentiation is preserved. The basal ganglia and insular cortices are normal. No extra-axial collection, subarachnoid hemorrhage or intraparenchymal hematoma. No mass lesion, midline shift or hydrocephalus.B there is a small dural-based calcification along the right frontal convexity, possibly a small meningioma. Complete opacification of the left sphenoid sinus and posterior ethmoid air cells, unchanged. Mastoids are clear. Normal orbits. ASPECTS [facility]([place] Stroke Program Early CT Score) - Ganglionic level infarction (caudate,  lentiform nuclei, internal capsule, insula, M1-M3 cortex): 7 - Supraganglionic infarction (M4-M6 cortex): 3 Total score (0-10 with 10 being normal): 10 IMPRESSION: 1. No acute intracranial hemorrhage. 2. ASPECTS is 10.  No CT evidence of acute cortical infarct. 3. Small dural base calcification along the right frontal convexity, possibly a small meningioma. Electronically Signed: By: Deatra Robinson M.D. On: 06/28/2016 23:38    Assessment/Plan . #1 history of low-grade temperature-he appears to be stable in this regards his white count has not been elevated no fever no chills urine culture and chest x-ray did not really show anything of concern-at this point will monitor.  #2-history renal insufficiency staff is pushing fluids he appears to be more on the upper end of his baseline with a creatinine of 1.45 at this point will monitor and check a metabolic panel next week continue to push fluids aggressively.  #3 anemia hemoglobin of 8.2 is on the lower end of his baseline suspected to have an element of chronic disease here-will check this again next week as well as.  #4 history diabetes type 2-as noted above we have recently made medication changes actually titrating up his  Lantus-since it was just increased we'll give a day few more days before making medication changes also would like to make sure his appetite continues to improve.  [id number]     London Sheer, CMA [phone]

## 2016-07-10 ENCOUNTER — Non-Acute Institutional Stay (SKILLED_NURSING_FACILITY): Payer: Medicare Other | Admitting: Internal Medicine

## 2016-07-10 ENCOUNTER — Encounter: Payer: Self-pay | Admitting: Internal Medicine

## 2016-07-10 DIAGNOSIS — D649 Anemia, unspecified: Secondary | ICD-10-CM | POA: Diagnosis not present

## 2016-07-10 DIAGNOSIS — E118 Type 2 diabetes mellitus with unspecified complications: Secondary | ICD-10-CM | POA: Diagnosis not present

## 2016-07-10 DIAGNOSIS — E1165 Type 2 diabetes mellitus with hyperglycemia: Secondary | ICD-10-CM

## 2016-07-10 DIAGNOSIS — R55 Syncope and collapse: Secondary | ICD-10-CM | POA: Diagnosis not present

## 2016-07-10 DIAGNOSIS — F0390 Unspecified dementia without behavioral disturbance: Secondary | ICD-10-CM | POA: Diagnosis not present

## 2016-07-10 NOTE — Progress Notes (Signed)
Location:   [facility] Room Number: 104/D Place of Service:  SNF (31) Provider:  Beola Cord, NP  Patient Care Team: Marletta Lor, NP as PCP - General (Nurse Practitioner)  Extended Emergency Contact Information Primary Emergency Contact: Welborne,[name] Address: [address] [place], [place] [address] [name] Home Phone: [phone] Mobile Phone: [phone] Relation: Daughter Secondary Emergency Contact: Gibson Ramp Address: [address]          [place], [place] [address] [name] Home Phone: [phone] Mobile Phone: [phone] Relation: Relative  Code Status:  DNR Goals of care: Advanced Directive information Advanced Directives 07/10/2016  Does patient have an advance directive? Yes  Type of Advance Directive Out of facility DNR (pink MOST or yellow form)  Does patient want to make changes to advanced directive? No - Patient declined  Copy of advanced directive(s) in chart? Yes  Pre-existing out of facility DNR order (yellow form or pink MOST form) -     Chief Complaint  Patient presents with  . Acute Visit    HPI:  Pt is a 82 y.o. male seen today for an acute visit for Abnormal Holter monitor showing Paroxysmal Atrial fibrillation. Patient for many years has had recurrent episodes of syncope. Last visit to hospital it was thought to be due to seizures though his EEG in was negative for Seizures.His depakene was recently increased. It was decided to do Holter Monitor to R/O cardiac arrhythmia as acause for his recurrent syncope. His Holter monitor showed PAF. Some PAC No PVC or bradyarrhythmia.Patient continue to have episodes in spite of being on Depakote. Patient is unable to give any history.   Past Medical History:  Diagnosis Date  . Arthritis    "probably"  . Chronic kidney disease    "? stage" (08/18/2014)  . Dementia    "don't know stage or type" (08/18/2014)  .  Elevated cholesterol   . Hypertension   . Type II diabetes mellitus (HCC)    Past Surgical History:  Procedure Laterality Date  . CATARACT EXTRACTION    . CORONARY ARTERY BYPASS GRAFT  ?1994   "CABG X3"  . TONSILLECTOMY      No Known Allergies  Current Outpatient Prescriptions on File Prior to Visit  Medication Sig Dispense Refill  . acetaminophen (TYLENOL) 325 MG tablet Take 650 mg by mouth every 6 (six) hours as needed.    [name] amLODipine (NORVASC) 10 MG tablet Take 10 mg by mouth daily.    [name] aspirin EC 81 MG tablet Take 81 mg by mouth daily.    . Cholecalciferol (VITAMIN D-3) 1000 UNITS CAPS Take 1,000 Units by mouth daily.    . divalproex (DEPAKOTE) 500 MG DR tablet Take 1 tablet (500 mg total) by mouth 2 (two) times daily. 60 tablet 1  . folic acid (FOLVITE) 1 MG tablet Take 1 mg by mouth daily.    . insulin glargine (LANTUS) 100 UNIT/ML injection Inject 13 Units into the skin at bedtime.     . insulin lispro (HUMALOG) 100 UNIT/ML injection Inject 5 Units into the skin 2 (two) times daily with a meal. With Lunch and Evening Meal    . levothyroxine (SYNTHROID, LEVOTHROID) 25 MCG tablet Take 2 tablets (50 mcg total) by mouth daily before breakfast. 60 tablet 1  . lisinopril (PRINIVIL,ZESTRIL) 40 MG tablet Take 40 mg by mouth daily.    . potassium chloride (  KLOR-CON) 20 MEQ packet Take 20 mEq by mouth daily.      No current facility-administered medications on file prior to visit.      Review of Systems  Unable to perform ROS: Dementia    Immunization History  Administered Date(s) Administered  . Influenza-Unspecified 05/29/2014  . PPD Test 11/01/2013, 10/18/2014  . Pneumococcal-Unspecified 11/22/1998    No flowsheet data found. Functional Status Survey:    Vitals:   07/10/16 1401  BP: (!) 130/53  Pulse: 79  Resp: 18  Temp: 98.5 F (36.9 C)  TempSrc: Oral   There is no height or weight on file to calculate BMI. Physical Exam  Constitutional: He appears  well-developed and well-nourished.  HENT:  Head: Normocephalic.  Mouth/Throat: Oropharynx is clear and moist.  Cardiovascular: Normal rate and regular rhythm.   No murmur heard. Pulmonary/Chest: Breath sounds normal. No respiratory distress. He has no wheezes. He has no rales.  Abdominal: Soft. Bowel sounds are normal. He exhibits no distension. There is no tenderness. There is no rebound.  Musculoskeletal: He exhibits edema.  Neurological: He is alert.    Labs reviewed:  Recent Labs  07/03/16 0710 07/06/16 0755 07/09/16 0625  NA 139 138 139  K 4.2 4.2 4.3  CL 103 101 104  CO2 26 27 27   GLUCOSE 267* 282* 240*  BUN 28* 49* 48*  CREATININE 1.26* 1.39* 1.45*  CALCIUM 8.7* 8.8* 8.3*    Recent Labs  02/16/16 0700 06/28/16 2305 07/01/16 1602  AST 17 19 25   ALT 15* 16* 5*  ALKPHOS 36* 43 46  BILITOT 0.8 0.3 0.7  PROT 6.1* 7.1 6.3*  ALBUMIN 3.1* 3.7 3.3*    Recent Labs  07/01/16 1602 07/03/16 0710 07/06/16 0755 07/09/16 0625  WBC 7.6 11.3* 8.6 9.3  NEUTROABS 5.8 7.6  --  6.7  HGB 11.0* 8.9* 8.7* 8.2*  HCT 32.5* 25.9* 25.8* 24.8*  MCV 94.8 94.5 95.6 98.0  PLT 183 171 225 342   Lab Results  Component Value Date   TSH 12.424 (H) 06/28/2016   Lab Results  Component Value Date   HGBA1C 9.1 (H) 05/30/2016   Lab Results  Component Value Date   CHOL 189 12/23/2014   HDL 44 12/23/2014   LDLCALC 125 (H) 12/23/2014   TRIG 101 12/23/2014   CHOLHDL 4.3 12/23/2014    Significant Diagnostic Results in last 30 days:  Dg Chest 1 View  Result Date: 07/01/2016 CLINICAL DATA:  Fall EXAM: CHEST 1 VIEW COMPARISON:  Portable exam 1603 hours compared to 03/16/2015 FINDINGS: Normal heart size, mediastinal contours, and pulmonary vascularity. Postsurgical changes of CABG. Atherosclerotic calcification aorta. Lungs clear. No pleural effusion or pneumothorax. Bones demineralized. Displaced oblique fracture of the proximal LEFT humerus again identified. IMPRESSION: No acute  abnormalities. Post CABG. Aortic atherosclerosis. Electronically Signed   By: Ulyses SouthwardMark  Boles M.D.   On: 07/01/2016 16:34   Dg Pelvis 1-2 Views  Result Date: 07/01/2016 CLINICAL DATA:  Fall EXAM: PELVIS - 1-2 VIEW COMPARISON:  02/15/2016 FINDINGS: Diffuse osseous demineralization. BILATERAL hip joint space narrowing slightly greater on RIGHT. SI joints preserved. Degenerative disc disease changes at lower lumbar spine. No definite acute fracture, dislocation, or bone destruction. Scattered atherosclerotic calcifications. IMPRESSION: Osseous demineralization with mild degenerative changes of both hips and more advanced degenerative changes at the visualized lower lumbar spine. No acute osseous abnormalities. Electronically Signed   By: Ulyses SouthwardMark  Boles M.D.   On: 07/01/2016 16:33   Ct Head Wo Contrast  Result Date: 07/01/2016  CLINICAL DATA:  [facility] staff reports pt was discharged today from [facility] and when he arrived to [facility] they noticed he had swelling, bruising, and blisters to left arm. They did an xray and found that pt has a humerus fracture. EXAM: CT HEAD WITHOUT CONTRAST TECHNIQUE: Contiguous axial images were obtained from the base of the skull through the vertex without intravenous contrast. COMPARISON:  06/28/2016 FINDINGS: Brain: There is significant central and cortical atrophy. Periventricular white matter changes are consistent with small vessel disease. An extra-axial calcified mass is identified adjacent to the right frontal bone measuring 8 mm and stable in appearance compared with prior study. There is no associated edema on, mass-effect, or hemorrhage. Vascular: Significant calcification of the internal carotid arteries. Skull: No calvarial fracture. Sinuses/Orbits: Significant opacification of the left sphenoid air cell and multiple bilateral ethmoid air cells. Mastoid air cells are normally aerated. Other: None IMPRESSION: 1. Atrophy and small vessel disease. 2.  No evidence for acute   abnormality. 3. Stable right frontal calcified mass measuring 8 mm. This may represent a small meningioma or meningeal calcification and appears stable. Electronically Signed   By: Norva Pavlov M.D.   On: 07/01/2016 16:59   Mr Laqueta Jean ZO Contrast  Result Date: 06/29/2016 CLINICAL DATA:  Confusion.  Left-sided weakness and slurred speech EXAM: MRI HEAD WITHOUT AND WITH CONTRAST TECHNIQUE: Multiplanar, multiecho pulse sequences of the brain and surrounding structures were obtained without and with intravenous contrast. CONTRAST:  9mL MULTIHANCE GADOBENATE DIMEGLUMINE 529 MG/ML IV SOLN COMPARISON:  CT head 06/28/2016.  MRI 10/04/2014 FINDINGS: Negative for acute infarct. Moderate atrophy. Mild chronic white matter changes. Small chronic infarct right paramedian pons. Normal cerebellum. Mild amount of hemosiderin staining along the cortex of the right frontal parietal lobe likely due to chronic mild subarachnoid hemorrhage. No acute hemorrhage is seen on yesterday's CT. No fluid collection or mass-effect Negative for mass or edema. Postcontrast imaging demonstrates normal enhancement. Small dural base calcification high right frontal lobe does not enhance and may be dural calcification or small calcified meningioma. Postcontrast imaging is degraded by motion. Extensive mucosal edema in the paranasal sinuses with complete opacification of the left sphenoid sinus and extensive mucosal edema in the left ethmoid sinus. Mucosal edema in the maxillary sinuses left greater than right. This is chronic and unchanged from 2015. No orbital lesion identified.  Pituitary not enlarged. IMPRESSION: Negative for acute infarct. Atrophy and mild chronic ischemic change Chronic sinusitis Electronically Signed   By: Marlan Palau M.D.   On: 06/29/2016 10:56   Dg Shoulder Left  Result Date: 07/01/2016 CLINICAL DATA:  Fall EXAM: LEFT SHOULDER - 2+ VIEW COMPARISON:  None FINDINGS: Osseous demineralization. AC joint alignment  normal. Oblique fracture at surgical neck LEFT humerus extending down the proximal diaphysis, mildly displaced. No definite dislocation. No additional fracture or bone destruction seen. Visualized LEFT ribs intact. IMPRESSION: Displaced oblique fracture at surgical neck LEFT humerus extending into the proximal diaphysis. Electronically Signed   By: Ulyses Southward M.D.   On: 07/01/2016 16:54   Dg Humerus Left  Result Date: 07/01/2016 CLINICAL DATA:  Swelling, bruising and blisters on LEFT arm, recent discharge from hospital, humeral fracture EXAM: LEFT HUMERUS - 2+ VIEW COMPARISON:  None FINDINGS: Osseous demineralization. AC joint alignment normal. Elbow and glenohumeral joint alignments grossly normal Displaced oblique fracture of the surgical neck LEFT humerus extending a short way down the proximal shaft. No additional fracture or dislocation. Bony excrescence question osteochondroma at distal LEFT humeral  metaphysis. IMPRESSION: Displaced oblique fracture of the proximal LEFT humerus. 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 Electronically Signed   By: Ulyses Southward M.D.   On: 07/01/2016 16:32   Ct Head Code Stroke Wo Contrast`  Addendum Date: 06/29/2016   ADDENDUM REPORT: 06/29/2016 00:50 ADDENDUM: These results were called by telephone at the time of interpretation on 06/28/2016 at 11:35 p.m. to Dr. Devoria Albe , who verbally acknowledged these results. Electronically Signed   By: Deatra Robinson M.D.   On: 06/29/2016 00:50   Result Date: 06/29/2016 CLINICAL DATA:  Code stroke.  Sudden onset left-sided weakness EXAM: CT HEAD WITHOUT CONTRAST TECHNIQUE: Contiguous axial images were obtained from the base of the skull through the vertex without intravenous contrast. COMPARISON:  Head CT 10/02/2015 FINDINGS: Cortical gray-white differentiation is preserved. The basal ganglia and insular cortices are normal. No extra-axial collection, subarachnoid hemorrhage or intraparenchymal hematoma. No mass lesion,  midline shift or hydrocephalus.B there is a small dural-based calcification along the right frontal convexity, possibly a small meningioma. Complete opacification of the left sphenoid sinus and posterior ethmoid air cells, unchanged. Mastoids are clear. Normal orbits. ASPECTS [facility] Stroke Program Early CT Score) - Ganglionic level infarction (caudate, lentiform nuclei, internal capsule, insula, M1-M3 cortex): 7 - Supraganglionic infarction (M4-M6 cortex): 3 Total score (0-10 with 10 being normal): 10 IMPRESSION: 1. No acute intracranial hemorrhage. 2. ASPECTS is 10.  No CT evidence of acute cortical infarct. 3. Small dural base calcification along the right frontal convexity, possibly a small meningioma. Electronically Signed: By: Deatra Robinson M.D. On: 06/28/2016 23:38    Assessment/Plan  Recurrent Syncope  It can be related to his PAF or other cardiac reason EKG right now showed normal rhythm with no new changes.. He does get better when he lies down for few min. Will get cardiology consult. Will continue Aspirin for now. He would also need Anticoagulation  Anemia  His HGB has slowly come down to 8.2 from 11.1 Will do Occult cards to r/o GI bleed.  Diabetes Mellitus type 2   BS running in 200-300. Increase the lantus to 15 units.  Chronic renal insufficiency with recent increase of BUN.  Continue pushing fluids.  Family/ staff Communication:    Labs/tests ordered:

## 2016-07-11 ENCOUNTER — Inpatient Hospital Stay (INDEPENDENT_AMBULATORY_CARE_PROVIDER_SITE_OTHER): Payer: Medicare Other

## 2016-07-11 ENCOUNTER — Ambulatory Visit (INDEPENDENT_AMBULATORY_CARE_PROVIDER_SITE_OTHER): Payer: Medicare Other | Admitting: Orthopaedic Surgery

## 2016-07-11 DIAGNOSIS — S42202D Unspecified fracture of upper end of left humerus, subsequent encounter for fracture with routine healing: Secondary | ICD-10-CM

## 2016-07-11 NOTE — Progress Notes (Signed)
CC:  His shoulder is still a little tender  Family member present with him.  He is a resident at [facility].  He is wearing his sling.  He has some left shoulder pain.  NV intact.  X-rays were done, reported separately.  Encounter Diagnosis  Name Primary?  . Proximal humerus fracture, left, with routine healing, subsequent encounter Yes   He has a comminuted proximal humerus fracture.  I would not recommend any surgery in his case for this.  Continue sling and conservative care.  Forms completed for nursing home.  Return in two weeks.  X-rays then.  Continue sling.  Electronically Signed Darreld McleanWayne Dashanti Burr, MD 9/13/20172:22 PM

## 2016-07-13 ENCOUNTER — Ambulatory Visit (INDEPENDENT_AMBULATORY_CARE_PROVIDER_SITE_OTHER): Payer: Medicare Other | Admitting: Cardiology

## 2016-07-13 VITALS — BP 136/66 | HR 80 | Ht 70.0 in | Wt 190.0 lb

## 2016-07-13 DIAGNOSIS — Z79899 Other long term (current) drug therapy: Secondary | ICD-10-CM

## 2016-07-13 DIAGNOSIS — R55 Syncope and collapse: Secondary | ICD-10-CM | POA: Diagnosis not present

## 2016-07-13 DIAGNOSIS — I480 Paroxysmal atrial fibrillation: Secondary | ICD-10-CM

## 2016-07-13 MED ORDER — APIXABAN 5 MG PO TABS: 5.0000 mg | ORAL_TABLET | Freq: Two times a day (BID) | ORAL | 6 refills | Status: DC

## 2016-07-13 NOTE — Progress Notes (Signed)
Clinical Summary Todd [name] is a 82 y.o.male seen as a new patient, he is referred by Dr Chales Abrahams  1. Syncope - long history of syncope, from prior notes thought likely to be neuro in origin. No clear cardiac etiology has been found.  - normal echo 2015   2. PAF - recent holter showed episodes of PAF, reported normal rates - rare palpitations - reports occasional falls, family is unsure of frequency. No bleeding troubles  3. Anemia - followed by primary    Past Medical History:  Diagnosis Date  . Arthritis    "probably"  . Chronic kidney disease    "? stage" (08/18/2014)  . Chronic kidney disease   . Dementia    "don't know stage or type" (08/18/2014)  . Elevated cholesterol   . Hypertension   . Seizures (HCC)   . Stroke (HCC)   . Type II diabetes mellitus (HCC)      No Known Allergies   No current facility-administered medications for this visit.    No current outpatient prescriptions on file.   Facility-Administered Medications Ordered in Other Visits  Medication Dose Route Frequency Provider Last Rate Last Dose  . acetaminophen (TYLENOL) tablet 650 mg  650 mg Oral Q6H PRN Houston Siren, MD      . amLODipine (NORVASC) tablet 10 mg  10 mg Oral Daily Houston Siren, MD   10 mg at 07/15/16 0815  . cefTRIAXone (ROCEPHIN) 1 g in dextrose 5 % 50 mL IVPB  1 g Intravenous Q24H Houston Siren, MD      . folic acid (FOLVITE) tablet 1 mg  1 mg Oral Daily Houston Siren, MD   1 mg at 07/15/16 0814  . insulin aspart (novoLOG) injection 0-5 Units  0-5 Units Subcutaneous QHS Houston Siren, MD      . insulin aspart (novoLOG) injection 0-9 Units  0-9 Units Subcutaneous TID WC Houston Siren, MD   2 Units at 07/15/16 [phone]  . insulin glargine (LANTUS) injection 5 Units  5 Units Subcutaneous QHS Houston Siren, MD   5 Units at 07/15/16 0114  . levETIRAcetam (KEPPRA) IVPB 1000 mg/100 mL premix  1,000 mg Intravenous Q12H Elliot Cousin, MD      . levETIRAcetam (KEPPRA) tablet 500 mg  500 mg Oral BID Houston Siren, MD    500 mg at 07/15/16 0110  . levothyroxine (SYNTHROID, LEVOTHROID) tablet 50 mcg  50 mcg Oral QAC breakfast Houston Siren, MD   50 mcg at 07/15/16 0815  . lisinopril (PRINIVIL,ZESTRIL) tablet 40 mg  40 mg Oral Daily Houston Siren, MD   40 mg at 07/15/16 0814  . pantoprazole (PROTONIX) EC tablet 40 mg  40 mg Oral Daily Elliot Cousin, MD      . potassium chloride (KLOR-CON) packet 20 mEq  20 mEq Oral Daily Houston Siren, MD   20 mEq at 07/15/16 0815     Past Surgical History:  Procedure Laterality Date  . CATARACT EXTRACTION    . CORONARY ARTERY BYPASS GRAFT  ?1994   "CABG X3"  . TONSILLECTOMY       No Known Allergies    No family history on file.   Social History Todd [name] reports that he has quit smoking. His smoking use included Pipe. He has never used smokeless tobacco. Todd [name] reports that he does not drink alcohol.   Review of Systems CONSTITUTIONAL: No weight loss, fever, chills, weakness or fatigue.  HEENT: Eyes: No visual loss, blurred vision, double vision  or yellow sclerae.No hearing loss, sneezing, congestion, runny nose or sore throat.  SKIN: No rash or itching.  CARDIOVASCULAR: per HPI RESPIRATORY: No shortness of breath, cough or sputum.  GASTROINTESTINAL: No anorexia, nausea, vomiting or diarrhea. No abdominal pain or blood.  GENITOURINARY: No burning on urination, no polyuria NEUROLOGICAL: per HPI MUSCULOSKELETAL: No muscle, back pain, joint pain or stiffness.  LYMPHATICS: No enlarged nodes. No history of splenectomy.  PSYCHIATRIC: No history of depression or anxiety.  ENDOCRINOLOGIC: No reports of sweating, cold or heat intolerance. No polyuria or polydipsia.  [name].   Physical Examination Vitals:   07/13/16 0945  BP: 136/66  Pulse: 80   Filed Weights   07/13/16 0945  Weight: 190 lb (86.2 kg)    Gen: resting comfortably, no acute distress HEENT: no scleral icterus, pupils equal round and reactive, no palptable cervical adenopathy,  CV: RRR, 2/6 sysotlic murmur  RUSB, no jvd Resp: Clear to auscultation bilaterally GI: abdomen is soft, non-tender, non-distended, normal bowel sounds, no hepatosplenomegaly MSK: extremities are warm, no edema.  Skin: warm, no rash Neuro:  no focal deficits Psych: appropriate affect     Assessment and Plan  1. Syncope - unclear if cardiac component. With ongoing episodes we will obtain a 30 day monitor to evaluate for possible arrhythmia  2. PAF - new diagnosis. 24 hr holter showed rate controlled PAF, will not start av nodal agents at this time. CHADS2Vasc score is 6. We discussed the risks vs benefits of anticoagulation. He has had occasional falls in the past. With discussions with patient and family we have elected to start eliquis 5mg  bid  F/u 6 weeks.       [name], M.D.

## 2016-07-13 NOTE — Patient Instructions (Signed)
Your physician recommends that you schedule a follow-up appointment in: 6 weeks   Your physician has recommended that you wear an event monitor for 30 days. Event monitors are medical devices that record the heart's electrical activity. Doctors most often us these monitors to diagnose arrhythmias. Arrhythmias are problems with the speed or rhythm of the heartbeat. The monitor is a small, portable device. You can wear one while you do your normal daily activities. This is usually used to diagnose what is causing palpitations/syncope (passing out).    Start Eliquis 5 mg twice a day   In 2 weeks,get labs:BMET,CBC     Thank you for choosing [facility] !

## 2016-07-14 ENCOUNTER — Emergency Department (HOSPITAL_COMMUNITY): Payer: Medicare Other

## 2016-07-14 ENCOUNTER — Inpatient Hospital Stay (HOSPITAL_COMMUNITY)
Admission: EM | Admit: 2016-07-14 | Discharge: 2016-07-17 | DRG: 689 | Disposition: A | Discharge status: Skilled Nursing Facility | Payer: Medicare Other | Attending: Internal Medicine | Admitting: Internal Medicine

## 2016-07-14 ENCOUNTER — Encounter (HOSPITAL_COMMUNITY): Payer: Self-pay | Admitting: Emergency Medicine

## 2016-07-14 DIAGNOSIS — Z7901 Long term (current) use of anticoagulants: Secondary | ICD-10-CM | POA: Diagnosis not present

## 2016-07-14 DIAGNOSIS — Z951 Presence of aortocoronary bypass graft: Secondary | ICD-10-CM

## 2016-07-14 DIAGNOSIS — F0390 Unspecified dementia without behavioral disturbance: Secondary | ICD-10-CM | POA: Diagnosis present

## 2016-07-14 DIAGNOSIS — E785 Hyperlipidemia, unspecified: Secondary | ICD-10-CM | POA: Diagnosis present

## 2016-07-14 DIAGNOSIS — E7800 Pure hypercholesterolemia, unspecified: Secondary | ICD-10-CM | POA: Diagnosis present

## 2016-07-14 DIAGNOSIS — D638 Anemia in other chronic diseases classified elsewhere: Secondary | ICD-10-CM | POA: Diagnosis present

## 2016-07-14 DIAGNOSIS — R569 Unspecified convulsions: Secondary | ICD-10-CM | POA: Diagnosis not present

## 2016-07-14 DIAGNOSIS — R400 Somnolence: Secondary | ICD-10-CM

## 2016-07-14 DIAGNOSIS — Z7982 Long term (current) use of aspirin: Secondary | ICD-10-CM | POA: Diagnosis not present

## 2016-07-14 DIAGNOSIS — Z8673 Personal history of transient ischemic attack (TIA), and cerebral infarction without residual deficits: Secondary | ICD-10-CM | POA: Diagnosis not present

## 2016-07-14 DIAGNOSIS — Z794 Long term (current) use of insulin: Secondary | ICD-10-CM | POA: Diagnosis not present

## 2016-07-14 DIAGNOSIS — B961 Klebsiella pneumoniae [K. pneumoniae] as the cause of diseases classified elsewhere: Secondary | ICD-10-CM | POA: Diagnosis present

## 2016-07-14 DIAGNOSIS — I639 Cerebral infarction, unspecified: Secondary | ICD-10-CM | POA: Diagnosis present

## 2016-07-14 DIAGNOSIS — E1121 Type 2 diabetes mellitus with diabetic nephropathy: Secondary | ICD-10-CM | POA: Diagnosis present

## 2016-07-14 DIAGNOSIS — I480 Paroxysmal atrial fibrillation: Secondary | ICD-10-CM | POA: Diagnosis present

## 2016-07-14 DIAGNOSIS — E118 Type 2 diabetes mellitus with unspecified complications: Secondary | ICD-10-CM

## 2016-07-14 DIAGNOSIS — E039 Hypothyroidism, unspecified: Secondary | ICD-10-CM | POA: Diagnosis present

## 2016-07-14 DIAGNOSIS — I129 Hypertensive chronic kidney disease with stage 1 through stage 4 chronic kidney disease, or unspecified chronic kidney disease: Secondary | ICD-10-CM | POA: Diagnosis present

## 2016-07-14 DIAGNOSIS — IMO0002 Reserved for concepts with insufficient information to code with codable children: Secondary | ICD-10-CM | POA: Diagnosis present

## 2016-07-14 DIAGNOSIS — N183 Chronic kidney disease, stage 3 unspecified: Secondary | ICD-10-CM | POA: Diagnosis present

## 2016-07-14 DIAGNOSIS — N390 Urinary tract infection, site not specified: Secondary | ICD-10-CM | POA: Diagnosis not present

## 2016-07-14 DIAGNOSIS — Z66 Do not resuscitate: Secondary | ICD-10-CM | POA: Diagnosis present

## 2016-07-14 DIAGNOSIS — E1122 Type 2 diabetes mellitus with diabetic chronic kidney disease: Secondary | ICD-10-CM | POA: Diagnosis present

## 2016-07-14 DIAGNOSIS — I2510 Atherosclerotic heart disease of native coronary artery without angina pectoris: Secondary | ICD-10-CM | POA: Diagnosis present

## 2016-07-14 DIAGNOSIS — D649 Anemia, unspecified: Secondary | ICD-10-CM | POA: Diagnosis present

## 2016-07-14 DIAGNOSIS — R4182 Altered mental status, unspecified: Secondary | ICD-10-CM | POA: Diagnosis present

## 2016-07-14 DIAGNOSIS — A499 Bacterial infection, unspecified: Secondary | ICD-10-CM | POA: Diagnosis present

## 2016-07-14 DIAGNOSIS — E1165 Type 2 diabetes mellitus with hyperglycemia: Secondary | ICD-10-CM | POA: Diagnosis present

## 2016-07-14 DIAGNOSIS — N3000 Acute cystitis without hematuria: Secondary | ICD-10-CM | POA: Diagnosis not present

## 2016-07-14 DIAGNOSIS — Z87891 Personal history of nicotine dependence: Secondary | ICD-10-CM | POA: Diagnosis not present

## 2016-07-14 DIAGNOSIS — I10 Essential (primary) hypertension: Secondary | ICD-10-CM | POA: Diagnosis present

## 2016-07-14 DIAGNOSIS — N179 Acute kidney failure, unspecified: Secondary | ICD-10-CM | POA: Diagnosis present

## 2016-07-14 DIAGNOSIS — G9340 Encephalopathy, unspecified: Secondary | ICD-10-CM | POA: Diagnosis present

## 2016-07-14 HISTORY — DX: Cerebral infarction, unspecified: I63.9

## 2016-07-14 HISTORY — DX: Paroxysmal atrial fibrillation: I48.0

## 2016-07-14 HISTORY — DX: Unspecified convulsions: R56.9

## 2016-07-14 LAB — URINALYSIS, ROUTINE W REFLEX MICROSCOPIC
BILIRUBIN URINE: NEGATIVE
GLUCOSE, UA: NEGATIVE mg/dL
HGB URINE DIPSTICK: NEGATIVE
Nitrite: NEGATIVE
PH: 5.5 (ref 5.0–8.0)
Specific Gravity, Urine: 1.025 (ref 1.005–1.030)

## 2016-07-14 LAB — CBC
HCT: 28.7 % — ABNORMAL LOW (ref 39.0–52.0)
Hemoglobin: 9.1 g/dL — ABNORMAL LOW (ref 13.0–17.0)
MCH: 31.5 pg (ref 26.0–34.0)
MCHC: 31.7 g/dL (ref 30.0–36.0)
MCV: 99.3 fL (ref 78.0–100.0)
PLATELETS: 492 10*3/uL — AB (ref 150–400)
RBC: 2.89 MIL/uL — AB (ref 4.22–5.81)
RDW: 14.3 % (ref 11.5–15.5)
WBC: 9.4 10*3/uL (ref 4.0–10.5)

## 2016-07-14 LAB — COMPREHENSIVE METABOLIC PANEL
ALT: 18 U/L (ref 17–63)
AST: 22 U/L (ref 15–41)
Albumin: 2.9 g/dL — ABNORMAL LOW (ref 3.5–5.0)
Alkaline Phosphatase: 123 U/L (ref 38–126)
Anion gap: 12 (ref 5–15)
BUN: 50 mg/dL — ABNORMAL HIGH (ref 6–20)
CALCIUM: 8.5 mg/dL — AB (ref 8.9–10.3)
CHLORIDE: 104 mmol/L (ref 101–111)
CO2: 22 mmol/L (ref 22–32)
CREATININE: 1.98 mg/dL — AB (ref 0.61–1.24)
GFR, EST AFRICAN AMERICAN: 34 mL/min — AB (ref >60)
GFR, EST NON AFRICAN AMERICAN: 30 mL/min — AB (ref >60)
Glucose, Bld: 241 mg/dL — ABNORMAL HIGH (ref 65–99)
Potassium: 4.3 mmol/L (ref 3.5–5.1)
Sodium: 138 mmol/L (ref 135–145)
Total Bilirubin: 0.5 mg/dL (ref 0.3–1.2)
Total Protein: 6.4 g/dL — ABNORMAL LOW (ref 6.5–8.1)

## 2016-07-14 LAB — LACTIC ACID, PLASMA: LACTIC ACID, VENOUS: 3.2 mmol/L — AB (ref 0.5–1.9)

## 2016-07-14 LAB — URINE MICROSCOPIC-ADD ON

## 2016-07-14 LAB — TROPONIN I: Troponin I: <0.03 ng/mL (ref <0.03)

## 2016-07-14 LAB — APTT: aPTT: 39 seconds — ABNORMAL HIGH (ref 24–36)

## 2016-07-14 LAB — PROTIME-INR
INR: 1.25
PROTHROMBIN TIME: 15.8 s — AB (ref 11.4–15.2)

## 2016-07-14 MED ORDER — SODIUM CHLORIDE 0.9 % IV BOLUS (SEPSIS)
1000.0000 mL | Freq: Once | INTRAVENOUS | Status: AC
  Administered 2016-07-14: 1000 mL via INTRAVENOUS

## 2016-07-14 MED ORDER — DEXTROSE 5 % IV SOLN
1.0000 g | Freq: Once | INTRAVENOUS | Status: AC
  Administered 2016-07-14: 1 g via INTRAVENOUS
  Filled 2016-07-14: qty 10

## 2016-07-14 NOTE — H&P (Signed)
History and Physical    Todd [name] ZOX:[id number] DOB: Sep 06, 1932 DOA: 07/14/2016  PCP: Marletta Lor, NP   Patient coming from: [facility]  Chief Complaint: Altered Mental Status  HPI: Todd [name] is a 82 y.o. male with medical history significant of type 2 DM, CABG, CKD, dementia, HTN, dyslipidemia, and hypothyroidism presents to the ED with AMS. Patient was found obtunded at [facility] when EMS was called. Patient was verbally responsive, although he was lethargic. He was recently discharged on 07/01/16 with diagnosis of seizure. At that time, EEG was negative, and neurology thought it was not seizures, so his Keppra was changed back to Depakote, used for mood stabalization.  He was also found at that time to have left humerus Fx, and was placed on a sling.   ED Course: While in the ED, vital signs are stable and patient's labs reveal BUN 50, creatinine 1.98, calcium 8.5, glucose 241, RBC 2.89, hemoglobin 9.1, HCT 28.7, lactic acid 3.2, and platelets 492. UA is indicative of infection. Patient was admitted for further evaluation of AMS.   Review of Systems: As per HPI otherwise 10 point review of systems negative.    Past Medical History:  Diagnosis Date  . Arthritis    "probably"  . Chronic kidney disease    "? stage" (08/18/2014)  . Chronic kidney disease   . Dementia    "don't know stage or type" (08/18/2014)  . Elevated cholesterol   . Hypertension   . Seizures (HCC)   . Stroke (HCC)   . Type II diabetes mellitus (HCC)     Past Surgical History:  Procedure Laterality Date  . CATARACT EXTRACTION    . CORONARY ARTERY BYPASS GRAFT  ?1994   "CABG X3"  . TONSILLECTOMY       reports that he has quit smoking. His smoking use included Pipe. He has never used smokeless tobacco. He reports that he does not drink alcohol or use drugs.  No Known Allergies  History reviewed. No pertinent family history.   Prior to Admission medications   Medication Sig Start Date End  Date Taking? Authorizing Provider  acetaminophen (TYLENOL) 325 MG tablet Take 650 mg by mouth every 6 (six) hours as needed.   Yes Historical Provider, MD  amLODipine (NORVASC) 10 MG tablet Take 10 mg by mouth daily.   Yes Historical Provider, MD  apixaban (ELIQUIS) 5 MG TABS tablet Take 1 tablet (5 mg total) by mouth 2 (two) times daily. 07/13/16  Yes Antoine Poche, MD  aspirin EC 81 MG tablet Take 81 mg by mouth daily.   Yes Historical Provider, MD  Cholecalciferol (VITAMIN D-3) 1000 UNITS CAPS Take 1,000 Units by mouth daily.   Yes Historical Provider, MD  divalproex (DEPAKOTE) 500 MG DR tablet Take 1 tablet (500 mg total) by mouth 2 (two) times daily. 07/01/16  Yes Houston Siren, MD  folic acid (FOLVITE) 1 MG tablet Take 1 mg by mouth daily.   Yes Historical Provider, MD  insulin glargine (LANTUS) 100 UNIT/ML injection Inject 15 Units into the skin at bedtime.    Yes Historical Provider, MD  insulin lispro (HUMALOG) 100 UNIT/ML injection Inject 5 Units into the skin 2 (two) times daily with a meal. With Lunch and Evening Meal   Yes Historical Provider, MD  levothyroxine (SYNTHROID, LEVOTHROID) 25 MCG tablet Take 2 tablets (50 mcg total) by mouth daily before breakfast. 07/01/16  Yes Houston Siren, MD  lisinopril (PRINIVIL,ZESTRIL) 40 MG tablet Take 40 mg by mouth  daily.   Yes Historical Provider, MD  potassium chloride (KLOR-CON) 20 MEQ packet Take 20 mEq by mouth daily.    Yes Historical Provider, MD    Physical Exam: Vitals:   07/14/16 1930 07/14/16 2000 07/14/16 2030 07/14/16 2100  BP: 121/56 134/58 (!) 121/54 (!) 93/48  Pulse:    71  Resp: 18 17 14 17   Temp:      TempSrc:      SpO2:    100%  Weight:      Height:          Constitutional: NAD, calm, comfortable Vitals:   07/14/16 1930 07/14/16 2000 07/14/16 2030 07/14/16 2100  BP: 121/56 134/58 (!) 121/54 (!) 93/48  Pulse:    71  Resp: 18 17 14 17   Temp:      TempSrc:      SpO2:    100%  Weight:      Height:       Eyes: PERRL,  lids and conjunctivae normal ENMT: Mucous membranes are moist. Posterior pharynx clear of any exudate or lesions.Normal dentition.  Neck: normal, supple, no masses, no thyromegaly Respiratory: clear to auscultation bilaterally, no wheezing, no crackles. Normal respiratory effort. No accessory muscle use.  Cardiovascular: Regular rate and rhythm, no murmurs / rubs / gallops. No extremity edema. 2+ pedal pulses. No carotid bruits.  Abdomen: no tenderness, no masses palpated. No hepatosplenomegaly. Bowel sounds positive.  Musculoskeletal: no clubbing / cyanosis. No joint deformity upper and lower extremities. Good ROM, no contractures. Normal muscle tone.  Skin: no rashes, lesions, ulcers. No induration Neurologic: CN 2-12 grossly intact. Sensation intact, DTR normal. Strength 5/5 in all 4.  Psychiatric: Normal judgment and insight. Alert and oriented x 3. Normal mood.    Labs on Admission: I have personally reviewed following labs and imaging studies  CBC:  Recent Labs Lab 07/09/16 0625 07/14/16 1901  WBC 9.3 9.4  NEUTROABS 6.7  --   HGB 8.2* 9.1*  HCT 24.8* 28.7*  MCV 98.0 99.3  PLT 342 492*   Basic Metabolic Panel:  Recent Labs Lab 07/09/16 0625 07/14/16 1901  NA 139 138  K 4.3 4.3  CL 104 104  CO2 27 22  GLUCOSE 240* 241*  BUN 48* 50*  CREATININE 1.45* 1.98*  CALCIUM 8.3* 8.5*   GFR: Estimated Creatinine Clearance: 29.7 mL/min (by C-G formula based on SCr of 1.98 mg/dL (H)). Liver Function Tests:  Recent Labs Lab 07/14/16 1901  AST 22  ALT 18  ALKPHOS 123  BILITOT 0.5  PROT 6.4*  ALBUMIN 2.9*   Coagulation Profile:  Recent Labs Lab 07/14/16 1901  INR 1.25   Cardiac Enzymes:  Recent Labs Lab 07/14/16 1901  TROPONINI <0.03   Urine analysis:    Component Value Date/Time   COLORURINE YELLOW 07/14/2016 1852   APPEARANCEUR HAZY (A) 07/14/2016 1852   LABSPEC 1.025 07/14/2016 1852   PHURINE 5.5 07/14/2016 1852   GLUCOSEU NEGATIVE 07/14/2016 1852     HGBUR NEGATIVE 07/14/2016 1852   BILIRUBINUR NEGATIVE 07/14/2016 1852   KETONESUR TRACE (A) 07/14/2016 1852   PROTEINUR TRACE (A) 07/14/2016 1852   UROBILINOGEN 0.2 09/01/2015 1835   NITRITE NEGATIVE 07/14/2016 1852   LEUKOCYTESUR MODERATE (A) 07/14/2016 1852    Recent Results (from the past 240 hour(s))  Culture, Urine     Status: None   Collection Time: 07/05/16  4:13 PM  Result Value Ref Range Status   Specimen Description URINE, CATHETERIZED  Final   Special Requests NONE  Final   Culture NO GROWTH Performed at [facility]   Final   Report Status 07/07/2016 FINAL  Final  Blood culture (routine x 2)     Status: None (Preliminary result)   Collection Time: 07/14/16  7:01 PM  Result Value Ref Range Status   Specimen Description BLOOD RIGHT HAND  Final   Special Requests BOTTLES DRAWN AEROBIC ONLY 2CC ONLY  Final   Culture PENDING  Incomplete   Report Status PENDING  Incomplete     Radiological Exams on Admission: Ct Head Wo Contrast  Result Date: 07/14/2016 CLINICAL DATA:  Acute onset of altered mental status. Aphasia. Initial encounter. EXAM: CT HEAD WITHOUT CONTRAST TECHNIQUE: Contiguous axial images were obtained from the base of the skull through the vertex without intravenous contrast. COMPARISON:  CT of the head performed 07/01/2016 FINDINGS: Brain: No evidence of acute infarction, hemorrhage, hydrocephalus, extra-axial collection or mass lesion/mass effect. Prominence of the ventricles and sulci reflects moderate cortical volume loss. Cerebellar atrophy is noted. Mild periventricular white matter change likely reflects small vessel ischemic microangiopathy. The brainstem and fourth ventricle are within normal limits. The basal ganglia are unremarkable in appearance. The cerebral hemispheres demonstrate grossly normal gray-white differentiation. No mass effect or midline shift is seen. Vascular: No hyperdense vessel or unexpected calcification. Skull: There is no  evidence of fracture; visualized osseous structures are unremarkable in appearance. Sinuses/Orbits: The visualized portions of the orbits are within normal limits. There is opacification of the left side of the sphenoid sinus and left ethmoid air cells. The remaining paranasal sinuses and mastoid air cells are well-aerated. Other: No significant soft tissue abnormalities are seen. IMPRESSION: 1. No acute intracranial pathology seen on CT. 2. Moderate cortical volume loss and scattered small vessel ischemic microangiopathy. 3. Opacification of the left side of the sphenoid sinus and left ethmoid air cells. Electronically Signed   By: Roanna Raider M.D.   On: 07/14/2016 20:33   Dg Chest Portable 1 View  Result Date: 07/14/2016 CLINICAL DATA:  Altered mental status. EXAM: PORTABLE CHEST 1 VIEW COMPARISON:  07/01/2016 FINDINGS: Lordotic technique demonstrated. Sternotomy wires are unchanged. Lungs are adequately inflated without focal consolidation or effusion. Mild cardiomegaly. Old left humeral neck/ head fracture. IMPRESSION: No active disease. Mild cardiomegaly. Electronically Signed   By: Elberta Fortis M.D.   On: 07/14/2016 19:24    EKG: Independently reviewed. Sinus rhythm.  Assessment/Plan Active Problems:   Seizure (HCC)  1. Acute encephalopathy due to UTI. WBC wnl. UA is indicative of infection. Administer IV rocephin. Follow up urine cultures. 2. Seizure. Prior EEG was negative. The more I reviewed his case, the more I believe this is a seizure. I discussed the situation with his daughter, [name]. Will hold Depakote. Administer kepra.  She agreed with no benefit getting repeat EEG.  3. Left humerus fracture. Continue using sling. Avoid narcotics. Administer tylenol. 4. Hx of CVA. Continue eliquis. 5. Type 2 DM. Glucose elevated at 241. Administer 5 units of lantus, sensitive scale. Place on carb-modified diet. 6. Hypothyroidism. Continue synthroids. 7. HTN. Continue to  monitor. 8. Dyslipidemia. Start on statin. 9. Anemia. Hemoglobin 9.1. Recheck in the am. 10. CKD. BUN and creatinine levels elevated. Continue to monitor. 11. CABG. Noted. 12. Dementia. Noted.   DVT prophylaxis: Eliquis Code Status: DNR Family Communication: No family at bedside Disposition Plan: Discharge once improved Consults called: None Admission status: Inpatient    Houston Siren, MD FACP [facility] If 7PM-7AM, please contact night-coverage [email or web address] Password [email or web address]  07/14/2016, 9:49 PM   By signing my name below, I, Nikki DomMaleeha Khan, attest that this documentation has been prepared under the direction and in the presence of Houston SirenPeter Kyrian Stage, MD. Electronically signed: Nikki DomMaleeha Khan, Scribe. 07/14/16 10:01PM

## 2016-07-14 NOTE — ED Triage Notes (Signed)
Per EMS: Pt from [facility], unresponsive starting 5 minutes ago, aroused to sternal rub. Pt normally talks and is interactive with staff.  138/50 cbg 334, hr 78

## 2016-07-14 NOTE — ED Notes (Signed)
Spoke to vanissa at [facility] and she informed us that pt was responsive from when she got to work around 3:00pm and last known well was 1755. Dr. Hyacinth MeekerMiller informed.

## 2016-07-14 NOTE — ED Notes (Signed)
Pt responsive to verbal - opens eyes : He is in and out cathed without response or change in facial expression, He has had labs partially drawn, Xray'd awaiting 2nd cultures and CT

## 2016-07-14 NOTE — ED Provider Notes (Signed)
[facility]-EMERGENCY DEPT Provider Note   CSN: [id number] Arrival date & time: 07/14/16  1821   History   Chief Complaint Chief Complaint  Patient presents with  . Altered Mental Status    HPI Ramirez Fullbright is a 82 y.o. male.  The pt is an 82 y/o male with hx of dementia, CKD. Also has a history of coronary artery bypass grafting and hypertension. According to the medical record the patient has had a recent humerus fracture on the left, he has also recently been seen at the cardiology office yesterday to be evaluated for syncope. The patient has undergone some kind of Holter monitor testing which showed some possible paroxysmal atrial fibrillation. The patient presents from the nursing home obtunded, it is unclear when this happened at approximately 5 minutes ago was reported per EMS. The patient is unable to give any history as he is obtunded. He does not follow commands, he does not open his eyes except to loud voice, they do not stay open very long. There is no other information at this time, level V caveat exist    The history is provided by the EMS personnel, the nursing home and medical records.  Altered Mental Status      Past Medical History:  Diagnosis Date  . Arthritis    "probably"  . Chronic kidney disease    "? stage" (08/18/2014)  . Chronic kidney disease   . Dementia    "don't know stage or type" (08/18/2014)  . Elevated cholesterol   . Hypertension   . PAF (paroxysmal atrial fibrillation) (HCC) 07/15/2016  . Seizures (HCC)   . Stroke (HCC)   . Type II diabetes mellitus [facility])     Patient Active Problem List   Diagnosis Date Noted  . Essential hypertension 07/15/2016  . UTI (urinary tract infection), bacterial 07/15/2016  . PAF (paroxysmal atrial fibrillation) (HCC) 07/15/2016  . Hypothyroidism 07/04/2016  . Seizure (HCC) 06/29/2016  . AKI (acute kidney injury) (HCC) 06/29/2016  . Acute encephalopathy   . Weight gain 08/02/2015  . Anemia, unspecified  06/22/2015  . Conjunctivitis 06/22/2015  . Unresponsive episode 03/20/2015  . Edema 03/11/2015  . Thrombocytopenia (HCC) 11/29/2014  . DVT (deep venous thrombosis) (HCC) 11/28/2014  . Hypokalemia 11/24/2014  . Syncope 10/16/2014  . CVA (cerebral infarction) history   . Altered mental status 08/18/2014  . Diabetes mellitus type 2, uncontrolled, with complications (HCC) 08/18/2014  . CAD (coronary atherosclerotic disease) 08/18/2014  . S/P CABG (coronary artery bypass graft) 08/18/2014  . CKD (chronic kidney disease), stage III 08/18/2014  . Dementia 08/18/2014  . HTN (hypertension) 08/18/2014  . Dyslipidemia 08/18/2014    Past Surgical History:  Procedure Laterality Date  . CATARACT EXTRACTION    . CORONARY ARTERY BYPASS GRAFT  ?1994   "CABG X3"  . TONSILLECTOMY         Home Medications    Prior to Admission medications   Medication Sig Start Date End Date Taking? Authorizing Provider  acetaminophen (TYLENOL) 325 MG tablet Take 650 mg by mouth every 6 (six) hours as needed.   Yes Historical Provider, MD  amLODipine (NORVASC) 10 MG tablet Take 10 mg by mouth daily.   Yes Historical Provider, MD  apixaban (ELIQUIS) 5 MG TABS tablet Take 1 tablet (5 mg total) by mouth 2 (two) times daily. 07/13/16  Yes Antoine Poche, MD  aspirin EC 81 MG tablet Take 81 mg by mouth daily.   Yes Historical Provider, MD  Cholecalciferol (VITAMIN D-3) 1000  UNITS CAPS Take 1,000 Units by mouth daily.   Yes Historical Provider, MD  divalproex (DEPAKOTE) 500 MG DR tablet Take 1 tablet (500 mg total) by mouth 2 (two) times daily. 07/01/16  Yes Houston Siren, MD  folic acid (FOLVITE) 1 MG tablet Take 1 mg by mouth daily.   Yes Historical Provider, MD  insulin glargine (LANTUS) 100 UNIT/ML injection Inject 15 Units into the skin at bedtime.    Yes Historical Provider, MD  insulin lispro (HUMALOG) 100 UNIT/ML injection Inject 5 Units into the skin 2 (two) times daily with a meal. With Lunch and Evening Meal    Yes Historical Provider, MD  levothyroxine (SYNTHROID, LEVOTHROID) 25 MCG tablet Take 2 tablets (50 mcg total) by mouth daily before breakfast. 07/01/16  Yes Houston Siren, MD  lisinopril (PRINIVIL,ZESTRIL) 40 MG tablet Take 40 mg by mouth daily.   Yes Historical Provider, MD  potassium chloride (KLOR-CON) 20 MEQ packet Take 20 mEq by mouth daily.    Yes Historical Provider, MD    Family History History reviewed. No pertinent family history.  Social History Social History  Substance Use Topics  . Smoking status: Former Smoker    Types: Pipe  . Smokeless tobacco: Never Used     Comment: "quit smoking a pipe in the 80's"  . Alcohol use No     Allergies   Review of patient's allergies indicates no known allergies.   Review of Systems Review of Systems  Unable to perform ROS: Mental status change     Physical Exam Updated Vital Signs BP (!) 134/50 (BP Location: Right Arm)   Pulse 69   Temp 98.2 F (36.8 C) (Oral)   Resp 18   Ht 5\' 10"  (1.778 m)   Wt 197 lb 5 oz (89.5 kg)   SpO2 93%   BMI 28.31 kg/m   Physical Exam  Constitutional: He appears well-developed and well-nourished. He appears distressed.  HENT:  Head: Normocephalic and atraumatic.  Mouth/Throat: No oropharyngeal exudate.  Pt will not open mouth - can't evaluate the OP  Eyes: Conjunctivae are normal. Pupils are equal, round, and reactive to light. Right eye exhibits no discharge. Left eye exhibits no discharge. No scleral icterus.  Neck: Normal range of motion. Neck supple. No JVD present. No thyromegaly present.  Cardiovascular: Normal rate, regular rhythm, normal heart sounds and intact distal pulses.  Exam reveals no gallop and no friction rub.   No murmur heard. Pulmonary/Chest: Effort normal and breath sounds normal. No respiratory distress. He has no wheezes. He has no rales.  Abdominal: Soft. Bowel sounds are normal. He exhibits no distension and no mass. There is no tenderness.  Genitourinary:    Genitourinary Comments: Normal appaering penis and testicles - uncirc  Musculoskeletal: Normal range of motion. He exhibits no edema, tenderness or deformity.  Lymphadenopathy:    He has no cervical adenopathy.  Neurological: He is alert. Coordination normal.  The patient is obtunded, he does not follow commands, when stimulated with deep painful stimuli he is able to grimace and open his eyes, he moans and then goes back to being obtunded  Skin: Skin is warm and dry. No rash noted. No erythema.  Psychiatric: He has a normal mood and affect. His behavior is normal.  Nursing note and vitals reviewed.    ED Treatments / Results  Labs (all labs ordered are listed, but only abnormal results are displayed) Labs Reviewed  APTT - Abnormal; Notable for the following:  Result Value   aPTT 39 (*)    All other components within normal limits  CBC - Abnormal; Notable for the following:    RBC 2.89 (*)    Hemoglobin 9.1 (*)    HCT 28.7 (*)    Platelets 492 (*)    All other components within normal limits  COMPREHENSIVE METABOLIC PANEL - Abnormal; Notable for the following:    Glucose, Bld 241 (*)    BUN 50 (*)    Creatinine, Ser 1.98 (*)    Calcium 8.5 (*)    Total Protein 6.4 (*)    Albumin 2.9 (*)    GFR calc non Af Amer 30 (*)    GFR calc Af Amer 34 (*)    All other components within normal limits  PROTIME-INR - Abnormal; Notable for the following:    Prothrombin Time 15.8 (*)    All other components within normal limits  URINALYSIS, ROUTINE W REFLEX MICROSCOPIC (NOT [facility]) - Abnormal; Notable for the following:    APPearance HAZY (*)    Ketones, ur TRACE (*)    Protein, ur TRACE (*)    Leukocytes, UA MODERATE (*)    All other components within normal limits  URINE MICROSCOPIC-ADD ON - Abnormal; Notable for the following:    Squamous Epithelial / LPF 0-5 (*)    Bacteria, UA MANY (*)    All other components within normal limits  LACTIC ACID, PLASMA - Abnormal; Notable  for the following:    Lactic Acid, Venous 3.2 (*)    All other components within normal limits  COMPREHENSIVE METABOLIC PANEL - Abnormal; Notable for the following:    Glucose, Bld 205 (*)    BUN 47 (*)    Creatinine, Ser 1.49 (*)    Calcium 8.2 (*)    Total Protein 5.8 (*)    Albumin 2.5 (*)    ALT 15 (*)    GFR calc non Af Amer 42 (*)    GFR calc Af Amer 49 (*)    All other components within normal limits  CBC - Abnormal; Notable for the following:    RBC 2.43 (*)    Hemoglobin 7.7 (*)    HCT 23.7 (*)    Platelets 442 (*)    All other components within normal limits  GLUCOSE, CAPILLARY - Abnormal; Notable for the following:    Glucose-Capillary 175 (*)    All other components within normal limits  GLUCOSE, CAPILLARY - Abnormal; Notable for the following:    Glucose-Capillary 189 (*)    All other components within normal limits  GLUCOSE, CAPILLARY - Abnormal; Notable for the following:    Glucose-Capillary 282 (*)    All other components within normal limits  HEMOGLOBIN AND HEMATOCRIT, BLOOD - Abnormal; Notable for the following:    Hemoglobin 7.9 (*)    HCT 24.4 (*)    All other components within normal limits  RETICULOCYTES - Abnormal; Notable for the following:    Retic Ct Pct 5.2 (*)    RBC. 2.48 (*)    All other components within normal limits  CULTURE, BLOOD (ROUTINE X 2)  CULTURE, BLOOD (ROUTINE X 2)  URINE CULTURE  TROPONIN I  TSH  CBC WITH DIFFERENTIAL/PLATELET  FOLATE  IRON AND TIBC  FERRITIN  TYPE AND SCREEN    EKG  EKG Interpretation  Date/Time:  Saturday July 14 2016 18:34:30 EDT Ventricular Rate:  94 PR Interval:    QRS Duration: 112 QT Interval:  360 QTC  Calculation: 451 R Axis:   46 Text Interpretation:  Sinus rhythm Ventricular premature complex Borderline intraventricular conduction delay Borderline repolarization abnormality since last tracing no significant change Confirmed by Hyacinth Meeker  MD, Nimah Uphoff ([id number]) on 07/14/2016 6:50:04 PM         Radiology Ct Head Wo Contrast  Result Date: 07/14/2016 CLINICAL DATA:  Acute onset of altered mental status. Aphasia. Initial encounter. EXAM: CT HEAD WITHOUT CONTRAST TECHNIQUE: Contiguous axial images were obtained from the base of the skull through the vertex without intravenous contrast. COMPARISON:  CT of the head performed 07/01/2016 FINDINGS: Brain: No evidence of acute infarction, hemorrhage, hydrocephalus, extra-axial collection or mass lesion/mass effect. Prominence of the ventricles and sulci reflects moderate cortical volume loss. Cerebellar atrophy is noted. Mild periventricular white matter change likely reflects small vessel ischemic microangiopathy. The brainstem and fourth ventricle are within normal limits. The basal ganglia are unremarkable in appearance. The cerebral hemispheres demonstrate grossly normal gray-white differentiation. No mass effect or midline shift is seen. Vascular: No hyperdense vessel or unexpected calcification. Skull: There is no evidence of fracture; visualized osseous structures are unremarkable in appearance. Sinuses/Orbits: The visualized portions of the orbits are within normal limits. There is opacification of the left side of the sphenoid sinus and left ethmoid air cells. The remaining paranasal sinuses and mastoid air cells are well-aerated. Other: No significant soft tissue abnormalities are seen. IMPRESSION: 1. No acute intracranial pathology seen on CT. 2. Moderate cortical volume loss and scattered small vessel ischemic microangiopathy. 3. Opacification of the left side of the sphenoid sinus and left ethmoid air cells. Electronically Signed   By: Roanna Raider M.D.   On: 07/14/2016 20:33   Dg Chest Portable 1 View  Result Date: 07/14/2016 CLINICAL DATA:  Altered mental status. EXAM: PORTABLE CHEST 1 VIEW COMPARISON:  07/01/2016 FINDINGS: Lordotic technique demonstrated. Sternotomy wires are unchanged. Lungs are adequately inflated without focal  consolidation or effusion. Mild cardiomegaly. Old left humeral neck/ head fracture. IMPRESSION: No active disease. Mild cardiomegaly. Electronically Signed   By: Elberta Fortis M.D.   On: 07/14/2016 19:24    Procedures Procedures (including critical care time)  Medications Ordered in ED Medications  acetaminophen (TYLENOL) tablet 650 mg (not administered)  levothyroxine (SYNTHROID, LEVOTHROID) tablet 50 mcg (50 mcg Oral Given 07/15/16 0815)  lisinopril (PRINIVIL,ZESTRIL) tablet 40 mg (40 mg Oral Given 07/15/16 0814)  amLODipine (NORVASC) tablet 10 mg (10 mg Oral Given 07/15/16 0815)  folic acid (FOLVITE) tablet 1 mg (1 mg Oral Given 07/15/16 0814)  potassium chloride (KLOR-CON) packet 20 mEq (20 mEq Oral Given 07/15/16 0815)  cefTRIAXone (ROCEPHIN) 1 g in dextrose 5 % 50 mL IVPB (not administered)  pantoprazole (PROTONIX) EC tablet 40 mg (40 mg Oral Given 07/15/16 1000)  insulin glargine (LANTUS) injection 12 Units (not administered)  levETIRAcetam (KEPPRA) tablet 500 mg (not administered)  insulin aspart (novoLOG) injection 0-15 Units (5 Units Subcutaneous Given 07/15/16 1200)  insulin aspart (novoLOG) injection 0-5 Units (not administered)  0.45 % sodium chloride infusion ( Intravenous New Bag/Given 07/15/16 1200)  cefTRIAXone (ROCEPHIN) 1 g in dextrose 5 % 50 mL IVPB (0 g Intravenous Stopped 07/14/16 2147)  sodium chloride 0.9 % bolus 1,000 mL (0 mLs Intravenous Stopped 07/14/16 2233)  levETIRAcetam (KEPPRA) 1000 MG/100ML IVPB (1,000 mg  Given 07/15/16 0110)  0.9 %  sodium chloride infusion ( Intravenous New Bag/Given 07/15/16 1200)     Initial Impression / Assessment and Plan / ED Course  I have reviewed the triage vital signs  and the nursing notes.  Pertinent labs & imaging results that were available during my care of the patient were reviewed by me and considered in my medical decision making (see chart for details).  Clinical Course    The etiology of the patient's abnormal mental  status is unclear. It appears that it was relatively rapid decline, CT scan of the head will be ordered, labs, EKG, cardiac monitoring  Labs with UTI - other labs close to baseline, no other obvious source - thought to be possible seizures in thep ast - no seizure activity seen - consider post ictal state.  meds for UTI given  Dr. Conley Rolls to admit  Final Clinical Impressions(s) / ED Diagnoses   Final diagnoses:  None    New Prescriptions Current Discharge Medication List       Eber Hong, MD 07/15/16 1534

## 2016-07-14 NOTE — ED Notes (Signed)
Report to Sheena, RN 

## 2016-07-14 NOTE — ED Notes (Signed)
Received critical report MD informed

## 2016-07-14 NOTE — ED Notes (Addendum)
Call to [facility] questioned regarding pt L Arm in sling- Per POC: Todd [name], pt has a   humerus Fx that is 52 weeks old

## 2016-07-15 ENCOUNTER — Encounter (HOSPITAL_COMMUNITY): Payer: Self-pay | Admitting: Internal Medicine

## 2016-07-15 DIAGNOSIS — N183 Chronic kidney disease, stage 3 (moderate): Secondary | ICD-10-CM

## 2016-07-15 DIAGNOSIS — D649 Anemia, unspecified: Secondary | ICD-10-CM

## 2016-07-15 DIAGNOSIS — N390 Urinary tract infection, site not specified: Principal | ICD-10-CM

## 2016-07-15 DIAGNOSIS — E118 Type 2 diabetes mellitus with unspecified complications: Secondary | ICD-10-CM

## 2016-07-15 DIAGNOSIS — E1165 Type 2 diabetes mellitus with hyperglycemia: Secondary | ICD-10-CM

## 2016-07-15 DIAGNOSIS — I480 Paroxysmal atrial fibrillation: Secondary | ICD-10-CM

## 2016-07-15 DIAGNOSIS — R569 Unspecified convulsions: Secondary | ICD-10-CM

## 2016-07-15 DIAGNOSIS — G9340 Encephalopathy, unspecified: Secondary | ICD-10-CM

## 2016-07-15 DIAGNOSIS — I10 Essential (primary) hypertension: Secondary | ICD-10-CM | POA: Diagnosis present

## 2016-07-15 DIAGNOSIS — A499 Bacterial infection, unspecified: Secondary | ICD-10-CM | POA: Diagnosis present

## 2016-07-15 DIAGNOSIS — N179 Acute kidney failure, unspecified: Secondary | ICD-10-CM

## 2016-07-15 HISTORY — DX: Paroxysmal atrial fibrillation: I48.0

## 2016-07-15 LAB — CBC
HCT: 23.7 % — ABNORMAL LOW (ref 39.0–52.0)
HEMOGLOBIN: 7.7 g/dL — AB (ref 13.0–17.0)
MCH: 31.7 pg (ref 26.0–34.0)
MCHC: 32.5 g/dL (ref 30.0–36.0)
MCV: 97.5 fL (ref 78.0–100.0)
PLATELETS: 442 10*3/uL — AB (ref 150–400)
RBC: 2.43 MIL/uL — AB (ref 4.22–5.81)
RDW: 14.1 % (ref 11.5–15.5)
WBC: 7.9 10*3/uL (ref 4.0–10.5)

## 2016-07-15 LAB — COMPREHENSIVE METABOLIC PANEL
ALK PHOS: 120 U/L (ref 38–126)
ALT: 15 U/L — AB (ref 17–63)
ANION GAP: 7 (ref 5–15)
AST: 15 U/L (ref 15–41)
Albumin: 2.5 g/dL — ABNORMAL LOW (ref 3.5–5.0)
BILIRUBIN TOTAL: 0.6 mg/dL (ref 0.3–1.2)
BUN: 47 mg/dL — ABNORMAL HIGH (ref 6–20)
CALCIUM: 8.2 mg/dL — AB (ref 8.9–10.3)
CO2: 24 mmol/L (ref 22–32)
CREATININE: 1.49 mg/dL — AB (ref 0.61–1.24)
Chloride: 106 mmol/L (ref 101–111)
GFR, EST AFRICAN AMERICAN: 49 mL/min — AB (ref >60)
GFR, EST NON AFRICAN AMERICAN: 42 mL/min — AB (ref >60)
Glucose, Bld: 205 mg/dL — ABNORMAL HIGH (ref 65–99)
Potassium: 3.9 mmol/L (ref 3.5–5.1)
Sodium: 137 mmol/L (ref 135–145)
TOTAL PROTEIN: 5.8 g/dL — AB (ref 6.5–8.1)

## 2016-07-15 LAB — RETICULOCYTES
RBC.: 2.48 MIL/uL — AB (ref 4.22–5.81)
RETIC CT PCT: 5.2 % — AB (ref 0.4–3.1)
Retic Count, Absolute: 129 10*3/uL (ref 19.0–186.0)

## 2016-07-15 LAB — FERRITIN: Ferritin: 64 ng/mL (ref 24–336)

## 2016-07-15 LAB — GLUCOSE, CAPILLARY
GLUCOSE-CAPILLARY: 175 mg/dL — AB (ref 65–99)
GLUCOSE-CAPILLARY: 267 mg/dL — AB (ref 65–99)
Glucose-Capillary: 189 mg/dL — ABNORMAL HIGH (ref 65–99)
Glucose-Capillary: 282 mg/dL — ABNORMAL HIGH (ref 65–99)
Glucose-Capillary: 322 mg/dL — ABNORMAL HIGH (ref 65–99)

## 2016-07-15 LAB — HEMOGLOBIN AND HEMATOCRIT, BLOOD
HCT: 24.4 % — ABNORMAL LOW (ref 39.0–52.0)
HEMOGLOBIN: 7.9 g/dL — AB (ref 13.0–17.0)

## 2016-07-15 LAB — TYPE AND SCREEN
ABO/RH(D): AB NEG
Antibody Screen: NEGATIVE

## 2016-07-15 LAB — IRON AND TIBC
Iron: 46 ug/dL (ref 45–182)
Saturation Ratios: 18 % (ref 17.9–39.5)
TIBC: 249 ug/dL — ABNORMAL LOW (ref 250–450)
UIBC: 203 ug/dL

## 2016-07-15 LAB — FOLATE: FOLATE: 37.4 ng/mL (ref >5.9)

## 2016-07-15 LAB — TSH: TSH: 2.376 u[IU]/mL (ref 0.350–4.500)

## 2016-07-15 MED ORDER — INSULIN ASPART 100 UNIT/ML ~~LOC~~ SOLN: 0.0000 [IU] | Freq: Every day | SUBCUTANEOUS | Status: DC

## 2016-07-15 MED ORDER — DEXTROSE 5 % IV SOLN
1.0000 g | INTRAVENOUS | Status: DC
  Administered 2016-07-15: 1 g via INTRAVENOUS
  Filled 2016-07-15 (×2): qty 10

## 2016-07-15 MED ORDER — SODIUM CHLORIDE 0.9 % IV SOLN
1000.0000 mg | Freq: Two times a day (BID) | INTRAVENOUS | Status: DC
  Administered 2016-07-15: 1000 mg via INTRAVENOUS
  Filled 2016-07-15 (×4): qty 10

## 2016-07-15 MED ORDER — LEVETIRACETAM 500 MG PO TABS
500.0000 mg | ORAL_TABLET | Freq: Two times a day (BID) | ORAL | Status: DC
  Administered 2016-07-15: 500 mg via ORAL
  Filled 2016-07-15 (×2): qty 1

## 2016-07-15 MED ORDER — LISINOPRIL 10 MG PO TABS
40.0000 mg | ORAL_TABLET | Freq: Every day | ORAL | Status: DC
  Administered 2016-07-15 – 2016-07-17 (×3): 40 mg via ORAL
  Filled 2016-07-15 (×3): qty 4

## 2016-07-15 MED ORDER — INSULIN ASPART 100 UNIT/ML ~~LOC~~ SOLN
0.0000 [IU] | Freq: Every day | SUBCUTANEOUS | Status: DC
Start: 2016-07-15 — End: 2016-07-16
  Administered 2016-07-15: 3 [IU] via SUBCUTANEOUS

## 2016-07-15 MED ORDER — ASPIRIN EC 81 MG PO TBEC
81.0000 mg | DELAYED_RELEASE_TABLET | Freq: Every day | ORAL | Status: DC
  Administered 2016-07-15: 81 mg via ORAL
  Filled 2016-07-15: qty 1

## 2016-07-15 MED ORDER — INSULIN ASPART 100 UNIT/ML ~~LOC~~ SOLN
0.0000 [IU] | Freq: Three times a day (TID) | SUBCUTANEOUS | Status: DC
  Administered 2016-07-15: 5 [IU] via SUBCUTANEOUS
  Administered 2016-07-15: 2 [IU] via SUBCUTANEOUS

## 2016-07-15 MED ORDER — LEVETIRACETAM 250 MG PO TABS: 250.0000 mg | ORAL_TABLET | Freq: Two times a day (BID) | ORAL | Status: DC

## 2016-07-15 MED ORDER — SODIUM CHLORIDE 0.45 % IV SOLN
INTRAVENOUS | Status: DC
  Administered 2016-07-15: 12:00:00 via INTRAVENOUS

## 2016-07-15 MED ORDER — APIXABAN 5 MG PO TABS
5.0000 mg | ORAL_TABLET | Freq: Two times a day (BID) | ORAL | Status: DC
  Administered 2016-07-15: 5 mg via ORAL
  Filled 2016-07-15: qty 1

## 2016-07-15 MED ORDER — AMLODIPINE BESYLATE 5 MG PO TABS
10.0000 mg | ORAL_TABLET | Freq: Every day | ORAL | Status: DC
  Administered 2016-07-15 – 2016-07-17 (×3): 10 mg via ORAL
  Filled 2016-07-15 (×3): qty 2

## 2016-07-15 MED ORDER — SODIUM CHLORIDE 0.9 % IV SOLN
Freq: Once | INTRAVENOUS | Status: AC
  Administered 2016-07-15: 12:00:00 via INTRAVENOUS

## 2016-07-15 MED ORDER — LEVETIRACETAM IN NACL 1000 MG/100ML IV SOLN
1000.0000 mg | Freq: Two times a day (BID) | INTRAVENOUS | Status: DC
  Administered 2016-07-15: 1000 mg via INTRAVENOUS
  Filled 2016-07-15 (×3): qty 100

## 2016-07-15 MED ORDER — DEXTROSE 5 % IV SOLN
INTRAVENOUS | Status: AC
  Filled 2016-07-15: qty 10

## 2016-07-15 MED ORDER — INSULIN GLARGINE 100 UNIT/ML ~~LOC~~ SOLN
12.0000 [IU] | Freq: Every day | SUBCUTANEOUS | Status: DC
  Administered 2016-07-15: 12 [IU] via SUBCUTANEOUS
  Filled 2016-07-15 (×2): qty 0.12

## 2016-07-15 MED ORDER — INSULIN ASPART 100 UNIT/ML ~~LOC~~ SOLN
0.0000 [IU] | Freq: Three times a day (TID) | SUBCUTANEOUS | Status: DC
Start: 2016-07-15 — End: 2016-07-16
  Administered 2016-07-15: 11 [IU] via SUBCUTANEOUS
  Administered 2016-07-15 – 2016-07-16 (×2): 5 [IU] via SUBCUTANEOUS
  Administered 2016-07-16: 8 [IU] via SUBCUTANEOUS

## 2016-07-15 MED ORDER — INSULIN GLARGINE 100 UNIT/ML ~~LOC~~ SOLN
5.0000 [IU] | Freq: Every day | SUBCUTANEOUS | Status: DC
  Administered 2016-07-15: 5 [IU] via SUBCUTANEOUS
  Filled 2016-07-15 (×2): qty 0.05

## 2016-07-15 MED ORDER — ACETAMINOPHEN 325 MG PO TABS: 650.0000 mg | ORAL_TABLET | Freq: Four times a day (QID) | ORAL | Status: DC | PRN

## 2016-07-15 MED ORDER — PANTOPRAZOLE SODIUM 40 MG PO TBEC
40.0000 mg | DELAYED_RELEASE_TABLET | Freq: Every day | ORAL | Status: DC
  Administered 2016-07-15 – 2016-07-17 (×3): 40 mg via ORAL
  Filled 2016-07-15 (×3): qty 1

## 2016-07-15 MED ORDER — LEVETIRACETAM 500 MG PO TABS
500.0000 mg | ORAL_TABLET | Freq: Two times a day (BID) | ORAL | Status: DC
  Administered 2016-07-16 – 2016-07-17 (×3): 500 mg via ORAL
  Filled 2016-07-15 (×3): qty 1

## 2016-07-15 MED ORDER — POTASSIUM CHLORIDE 20 MEQ PO PACK
20.0000 meq | PACK | Freq: Every day | ORAL | Status: DC
  Administered 2016-07-15 – 2016-07-17 (×3): 20 meq via ORAL
  Filled 2016-07-15 (×3): qty 1

## 2016-07-15 MED ORDER — LEVETIRACETAM IN NACL 1000 MG/100ML IV SOLN
INTRAVENOUS | Status: AC
Start: 2016-07-15 — End: 2016-07-15
  Administered 2016-07-15: 1000 mg
  Filled 2016-07-15: qty 100

## 2016-07-15 MED ORDER — FOLIC ACID 1 MG PO TABS
1.0000 mg | ORAL_TABLET | Freq: Every day | ORAL | Status: DC
  Administered 2016-07-15 – 2016-07-17 (×3): 1 mg via ORAL
  Filled 2016-07-15 (×3): qty 1

## 2016-07-15 MED ORDER — LEVOTHYROXINE SODIUM 50 MCG PO TABS
50.0000 ug | ORAL_TABLET | Freq: Every day | ORAL | Status: DC
  Administered 2016-07-15 – 2016-07-17 (×3): 50 ug via ORAL
  Filled 2016-07-15 (×3): qty 1

## 2016-07-15 NOTE — Progress Notes (Signed)
PROGRESS NOTE    Todd [name]  MGN:[id number] DOB: [birth year] DOA: 07/14/2016 PCP: Alvester Chou, NP    Brief Narrative:  Patient is an 82 year old man with a history of dementia, CAD/status post CABG, CK D, HTN, DM-2, hypothyroidism, and recent left humeral fracture.[name] He was recently discharged on 07/01/16 with a diagnosis of seizure although his EEG was negative and neurology did not believe his AMS was secondary to a seizure disorder.  He presented from the [facility] with altered mental status. While in the ED, his vital signs were stable. His labs revealed BUN 50, creatinine 1.98, calcium 8.5, glucose 241, RBC 2.89, hemoglobin 9.1, HCT 28.7, lactic acid 3.2, and platelets 492. UA was indicative of infection. Patient was admitted for further evaluation of AMS and UTI.  Assessment & Plan:   Principal Problem:   Acute encephalopathy Active Problems:   Anemia, unspecified   UTI (urinary tract infection), bacterial   CKD (chronic kidney disease), stage III   Seizure (HCC)   AKI (acute kidney injury) (HCC)   PAF (paroxysmal atrial fibrillation) (HCC)   Diabetes mellitus type 2, uncontrolled, with complications (HCC)   CAD (coronary atherosclerotic disease)   CVA (cerebral infarction) history   Hypothyroidism   Essential hypertension    1. Acute encephalopathy. This is the patient's second hospitalization for altered mental status. During the previous hospitalization, there was a question about the etiology TIA versus seizure disorder. It was believed that he did not have an ongoing seizure disorder. -The etiology of his acute encephalopathy could be secondary to the UTI in the setting of chronic dementia and metabolic derangements. Seizure disorder with postictal encephalopathy is also a possibility.  ? Seizure disorder. During the previous hospitalization, patient was noted to present with AMS thought to be secondary to a seizure. Neurology did not believe the patient met criteria  for epilepsy and patient's EEG revealed no epileptiform activity. -The admitting physician believes that the patient does have a seizure disorder causing confusion. Therefore, Keppra was started. Depakote was discontinued (previously prescribed for mood disorder). -We'll continue Keppra but will decrease the dose to 500 mg twice a day orally.  UTI. The patient's urinalysis was indicative of infection. Rocephin was started. Will continue it.  Paroxysmal atrial fibrillation. Patient was seen by Dr. branch after a recent Holter monitor revealed episodes of PAF. CHADS2Vasc score is 6 per cardiology. Eliquis was started at 5 mg twice a day. -The patient has had an unexplained nearly 2 g drop in his hemoglobin. Therefore, Eliquis and aspirin were discontinued for now.  Acute on chronic anemia. Patient's baseline hemoglobin was 11-12 g 2 weeks ago. It had progressively fallen to the 8.2-9.1 range. Patient's hemoglobin was 9.1 on admission. It has fallen to 7.7. -Have already ordered for Eliquis and aspirin to be discontinued. Order Protonix to be started empirically. -We'll order an anemia panel and Hemoccult of his stools. We'll order a type and screen.  Type 2 diabetes with nephropathy. Patient has a history of uncontrolled diabetes. His hemoglobin A1c was 9.18/2017. He is treated with Lantus and Humalog chronically. -Sliding scale NovoLog and Lantus were restarted. We'll continue and titrate accordingly.  Hypertension. Patient is treated chronically with lisinopril and Norvasc. Will restart Norvasc.  Acute kidney injury superimposed on stage III chronic kidney disease. Patient's baseline creatinine is 1.35-1.4. It was 1.98 on admission. He is on ACE inhibitor, he was given IV fluids in the ED and his creatinine has improved back close to baseline. -We will continue lisinopril  for now with a low threshold for decreasing the dose or holding it. -We'll restart gentle IV  fluids.  Hypothyroidism. Patient is treated with Synthroid. It was continued. His TSH is within normal limits.    DVT prophylaxis: Eliquis>> discontinued due to worsening anemia. Code Status: DO NOT RESUSCITATE Family Communication: Family not available Disposition Plan: Discharge when clinically appropriate   Consultants:   None  Procedures:   None  Antimicrobials:   Rocephin 07/14/16>>    Subjective: Patient says "no" to pain and shortness of breath.  Objective: Vitals:   07/14/16 2330 07/14/16 2341 07/15/16 0000 07/15/16 0514  BP: 141/63  138/72 (!) 140/59  Pulse:   77 71  Resp: _0 Temp:  97.8 F (36.6 C) 97.9 F (36.6 C) 98.2 F (36.8 C)  TempSrc:  Oral Oral Oral  SpO2:   100% 98%  Weight:   89.5 kg (197 lb 5 oz)   Height:        Intake/Output Summary (Last 24 hours) at 07/15/16 1137 Last data filed at 07/15/16 0514  Gross per 24 hour  Intake                0 ml  Output              250 ml  Net             -250 ml   Filed Weights   07/14/16 1824 07/15/16 0000  Weight: 86.2 kg (190 lb) 89.5 kg (197 lb 5 oz)    Examination:  General exam: Appears calm and comfortable, But confused.  Respiratory system: Clear to auscultation, decreased breath sounds in the bases. Respiratory effort normal. Cardiovascular system: S1 & S2 with 1 to 2/6 systolic murmur and occasional ectopy. No pedal edema. Gastrointestinal system: Abdomen is nondistended, soft and nontender. No organomegaly or masses felt. Normal bowel sounds heard. Central nervous system: Alert and oriented to himself only. No focal neurological deficits. Extremities/musculoskeletal: There is a left upper extremity sling in place with proximal tenderness and edema. Skin: No rashes, lesions or ulcers Psychiatry: Judgement and insight are poor due to his dementia. His speech is fairly clear.     Data Reviewed: I have personally reviewed following labs and imaging studies  CBC:  Recent  Labs Lab 07/09/16 0625 07/14/16 1901 07/15/16 0639  WBC 9.3 9.4 7.9  NEUTROABS 6.7  --   --   HGB 8.2* 9.1* 7.7*  HCT 24.8* 28.7* 23.7*  MCV 98.0 99.3 97.5  PLT 342 492* 833*   Basic Metabolic Panel:  Recent Labs Lab 07/09/16 0625 07/14/16 1901 07/15/16 0639  NA 139 138 137  K 4.3 4.3 3.9  CL 104 104 106  CO2 _1 GLUCOSE 240* 241* 205*  BUN 48* 50* 47*  CREATININE 1.45* 1.98* 1.49*  CALCIUM 8.3* 8.5* 8.2*   GFR: Estimated Creatinine Clearance: 43 mL/min (by C-G formula based on SCr of 1.49 mg/dL (H)). Liver Function Tests:  Recent Labs Lab 07/14/16 1901 07/15/16 0639  AST 22 15  ALT 18 15*  ALKPHOS 123 120  BILITOT 0.5 0.6  PROT 6.4* 5.8*  ALBUMIN 2.9* 2.5*   No results for input(s): LIPASE, AMYLASE in the last 168 hours. No results for input(s): AMMONIA in the last 168 hours. Coagulation Profile:  Recent Labs Lab 07/14/16 1901  INR 1.25   Cardiac Enzymes:  Recent Labs Lab 07/14/16 1901  TROPONINI <0.03   BNP (last 3 results) No results  for input(s): PROBNP in the last 8760 hours. HbA1C: No results for input(s): HGBA1C in the last 72 hours. CBG:  Recent Labs Lab 07/15/16 0114 07/15/16 0737 07/15/16 1111  GLUCAP 175* 189* 282*   Lipid Profile: No results for input(s): CHOL, HDL, LDLCALC, TRIG, CHOLHDL, LDLDIRECT in the last 72 hours. Thyroid Function Tests:  Recent Labs  07/15/16 0639  TSH 2.376   Anemia Panel: No results for input(s): VITAMINB12, FOLATE, FERRITIN, TIBC, IRON, RETICCTPCT in the last 72 hours. Sepsis Labs:  Recent Labs Lab 07/14/16 2024  LATICACIDVEN 3.2*    Recent Results (from the past 240 hour(s))  Culture, Urine     Status: None   Collection Time: 07/05/16  4:13 PM  Result Value Ref Range Status   Specimen Description URINE, CATHETERIZED  Final   Special Requests NONE  Final   Culture NO GROWTH Performed at [facility]   Final   Report Status 07/07/2016 FINAL  Final  Blood culture  (routine x 2)     Status: None (Preliminary result)   Collection Time: 07/14/16  7:01 PM  Result Value Ref Range Status   Specimen Description BLOOD RIGHT HAND  Final   Special Requests BOTTLES DRAWN AEROBIC ONLY 2CC ONLY  Final   Culture PENDING  Incomplete   Report Status PENDING  Incomplete  Blood culture (routine x 2)     Status: None (Preliminary result)   Collection Time: 07/14/16  8:24 PM  Result Value Ref Range Status   Specimen Description BLOOD LEFT HAND  Final   Special Requests BOTTLES DRAWN AEROBIC ONLY 6CC ONLY  Final   Culture PENDING  Incomplete   Report Status PENDING  Incomplete         Radiology Studies: Ct Head Wo Contrast  Result Date: 07/14/2016 CLINICAL DATA:  Acute onset of altered mental status. Aphasia. Initial encounter. EXAM: CT HEAD WITHOUT CONTRAST TECHNIQUE: Contiguous axial images were obtained from the base of the skull through the vertex without intravenous contrast. COMPARISON:  CT of the head performed 07/01/2016 FINDINGS: Brain: No evidence of acute infarction, hemorrhage, hydrocephalus, extra-axial collection or mass lesion/mass effect. Prominence of the ventricles and sulci reflects moderate cortical volume loss. Cerebellar atrophy is noted. Mild periventricular white matter change likely reflects small vessel ischemic microangiopathy. The brainstem and fourth ventricle are within normal limits. The basal ganglia are unremarkable in appearance. The cerebral hemispheres demonstrate grossly normal gray-white differentiation. No mass effect or midline shift is seen. Vascular: No hyperdense vessel or unexpected calcification. Skull: There is no evidence of fracture; visualized osseous structures are unremarkable in appearance. Sinuses/Orbits: The visualized portions of the orbits are within normal limits. There is opacification of the left side of the sphenoid sinus and left ethmoid air cells. The remaining paranasal sinuses and mastoid air cells are  well-aerated. Other: No significant soft tissue abnormalities are seen. IMPRESSION: 1. No acute intracranial pathology seen on CT. 2. Moderate cortical volume loss and scattered small vessel ischemic microangiopathy. 3. Opacification of the left side of the sphenoid sinus and left ethmoid air cells. Electronically Signed   By: Garald Balding M.D.   On: 07/14/2016 20:33   Dg Chest Portable 1 View  Result Date: 07/14/2016 CLINICAL DATA:  Altered mental status. EXAM: PORTABLE CHEST 1 VIEW COMPARISON:  07/01/2016 FINDINGS: Lordotic technique demonstrated. Sternotomy wires are unchanged. Lungs are adequately inflated without focal consolidation or effusion. Mild cardiomegaly. Old left humeral neck/ head fracture. IMPRESSION: No active disease. Mild cardiomegaly. Electronically Signed  By: Marin Olp M.D.   On: 07/14/2016 19:24        Scheduled Meds: . amLODipine  10 mg Oral Daily  . cefTRIAXone (ROCEPHIN)  IV  1 g Intravenous Q24H  . folic acid  1 mg Oral Daily  . insulin aspart  0-5 Units Subcutaneous QHS  . insulin aspart  0-9 Units Subcutaneous TID WC  . insulin glargine  5 Units Subcutaneous QHS  . levETIRAcetam  1,000 mg Intravenous Q12H  . levothyroxine  50 mcg Oral QAC breakfast  . lisinopril  40 mg Oral Daily  . pantoprazole  40 mg Oral Daily  . potassium chloride  20 mEq Oral Daily   Continuous Infusions:    LOS: 1 day    Time spent:40 minutes    Rexene Alberts, MD [facility] Pager [phone]  If 7PM-7AM, please contact night-coverage [email or web address] Password [facility] 07/15/2016, 11:37 AM

## 2016-07-16 ENCOUNTER — Telehealth: Payer: Self-pay | Admitting: Cardiology

## 2016-07-16 LAB — GLUCOSE, CAPILLARY
GLUCOSE-CAPILLARY: 291 mg/dL — AB (ref 65–99)
GLUCOSE-CAPILLARY: 257 mg/dL — AB (ref 65–99)
Glucose-Capillary: 249 mg/dL — ABNORMAL HIGH (ref 65–99)
Glucose-Capillary: 218 mg/dL — ABNORMAL HIGH (ref 65–99)
Glucose-Capillary: 173 mg/dL — ABNORMAL HIGH (ref 65–99)

## 2016-07-16 LAB — CBC
HEMATOCRIT: 25.6 % — AB (ref 39.0–52.0)
HEMOGLOBIN: 8.5 g/dL — AB (ref 13.0–17.0)
MCH: 32.3 pg (ref 26.0–34.0)
MCHC: 33.2 g/dL (ref 30.0–36.0)
MCV: 97.3 fL (ref 78.0–100.0)
Platelets: 489 10*3/uL — ABNORMAL HIGH (ref 150–400)
RBC: 2.63 MIL/uL — ABNORMAL LOW (ref 4.22–5.81)
RDW: 14.1 % (ref 11.5–15.5)
WBC: 7.8 10*3/uL (ref 4.0–10.5)

## 2016-07-16 LAB — BASIC METABOLIC PANEL
ANION GAP: 8 (ref 5–15)
BUN: 39 mg/dL — ABNORMAL HIGH (ref 6–20)
CHLORIDE: 105 mmol/L (ref 101–111)
CO2: 24 mmol/L (ref 22–32)
CREATININE: 1.23 mg/dL (ref 0.61–1.24)
Calcium: 8.4 mg/dL — ABNORMAL LOW (ref 8.9–10.3)
GFR calc non Af Amer: 53 mL/min — ABNORMAL LOW (ref >60)
Glucose, Bld: 225 mg/dL — ABNORMAL HIGH (ref 65–99)
Potassium: 4.1 mmol/L (ref 3.5–5.1)
Sodium: 137 mmol/L (ref 135–145)

## 2016-07-16 LAB — OCCULT BLOOD X 1 CARD TO LAB, STOOL: FECAL OCCULT BLD: NEGATIVE

## 2016-07-16 MED ORDER — CEFUROXIME AXETIL 250 MG PO TABS
500.0000 mg | ORAL_TABLET | Freq: Two times a day (BID) | ORAL | Status: DC
  Administered 2016-07-16 – 2016-07-17 (×2): 500 mg via ORAL
  Filled 2016-07-16 (×2): qty 2

## 2016-07-16 MED ORDER — INSULIN GLARGINE 100 UNIT/ML ~~LOC~~ SOLN
17.0000 [IU] | Freq: Every day | SUBCUTANEOUS | Status: DC
  Administered 2016-07-16: 17 [IU] via SUBCUTANEOUS
  Filled 2016-07-16 (×2): qty 0.17

## 2016-07-16 MED ORDER — APIXABAN 2.5 MG PO TABS
2.5000 mg | ORAL_TABLET | Freq: Two times a day (BID) | ORAL | Status: DC
  Administered 2016-07-16 – 2016-07-17 (×2): 2.5 mg via ORAL
  Filled 2016-07-16 (×2): qty 1

## 2016-07-16 MED ORDER — INSULIN ASPART 100 UNIT/ML ~~LOC~~ SOLN
0.0000 [IU] | Freq: Every day | SUBCUTANEOUS | Status: DC
  Administered 2016-07-16: 3 [IU] via SUBCUTANEOUS

## 2016-07-16 MED ORDER — INSULIN ASPART 100 UNIT/ML ~~LOC~~ SOLN
0.0000 [IU] | Freq: Three times a day (TID) | SUBCUTANEOUS | Status: DC
  Administered 2016-07-16: 4 [IU] via SUBCUTANEOUS
  Administered 2016-07-17: 3 [IU] via SUBCUTANEOUS
  Administered 2016-07-17: 7 [IU] via SUBCUTANEOUS

## 2016-07-16 NOTE — Clinical Social Work Note (Signed)
Clinical Social Work Assessment  Patient Details  Name: Todd FendtMilton Safer MRN: [id number] Date of Birth: 1932-08-13  Date of referral:  07/16/16               Reason for consult:  [facility]                Permission sought to share information with:    Permission granted to share information::     Name::        Agency::     Relationship::     Contact Information:  Message left for daughter, [name], who is listed on the chart.   Housing/Transportation Living arrangements for the past 2 months:  Skilled Nursing Facility Source of Information:  Patient Patient Interpreter Needed:  None Criminal Activity/Legal Involvement Pertinent to Current Situation/Hospitalization:  No - Comment as needed Significant Relationships:  Adult Children Lives with:  Facility Resident Do you feel safe going back to the place where you live?  No Need for family participation in patient care:  No (Coment)  Care giving concerns:  Facility resident.   Social Worker assessment / plan:  CSW left a message for patient's daughter, [name], requesting return contact. [facility] Patient is a long term resident, has ADLs completed by staff, and is using a wheelchair. Keri stated that patient when patient came to the facility he used a rollator, however at this point he is not getting around much and is typically in a recliner.  She stated that his daughter is his nearest relative and she does not visit with patient as she lives in [place]. She stated that patient can return to the facility at discharge.  Employment status:  Retired Database administratornsurance information:  Managed Medicare PT Recommendations:  Not assessed at this time Information / Referral to community resources:     Patient/Family's Response to care: Message left for daughter.   Patient/Family's Understanding of and Emotional Response to Diagnosis, Current Treatment, and Prognosis:  Message left for daughter to  assess.    Emotional Assessment Appearance:  Appears stated age Attitude/Demeanor/Rapport:  Unable to Assess Affect (typically observed):  Unable to Assess Orientation:  Oriented to Self Alcohol / Substance use:  Not Applicable Psych involvement (Current and /or in the community):  No (Comment)  Discharge Needs  Concerns to be addressed:  [facility] Readmission within the last 30 days:  Yes Current discharge risk:  Chronically ill Barriers to Discharge:  No Barriers Identified   Annice NeedySettle, Shauntell Iglesia D, LCSW 07/16/2016, 12:00 PM

## 2016-07-16 NOTE — Progress Notes (Signed)
Inpatient Diabetes Program Recommendations  AACE/ADA: New Consensus Statement on Inpatient Glycemic Control (2015)  Target Ranges:  Prepandial:   less than 140 mg/dL      Peak postprandial:   less than 180 mg/dL (1-2 hours)      Critically ill patients:  140 - 180 mg/dL   Results for Todd [name], Jonpaul (MRN [id number]) as of 07/16/2016 10:38  Ref. Range 07/15/2016 07:37 07/15/2016 11:11 07/15/2016 15:54 07/15/2016 21:06  Glucose-Capillary Latest Ref Range: 65 - 99 mg/dL 409189 (H) 811282 (H) 914322 (H) 267 (H)   Results for Todd [name], Todd [name] (MRN [id number]) as of 07/16/2016 10:38  Ref. Range 07/16/2016 08:14  Glucose-Capillary Latest Ref Range: 65 - 99 mg/dL 086218 (H)    Admit with: AMS/ UTI  History: DM, CKD, Dementia  Home DM Meds: Lantus 15 units QHS       Humalog 5 units bid (lunch and dinner)  Current Insulin Orders: Lantus 12 units QHS      Novolog Moderate Correction Scale/ SSI (0-15 units) TID AC + HS      MD- Please consider the following in-hospital insulin adjustments:  1. Increase Lantus to 15 units QHS (home dose)  2. Start Novolog Meal Coverage: Novolog 4 units tid with meals (hold if pt eats <50% of meal)      --Will follow patient during hospitalization--  Ambrose FinlandJeannine Johnston Jaydyn Menon RN, MSN, CDE Diabetes Coordinator Inpatient Glycemic Control Team Team Pager: [phone] (8a-5p)

## 2016-07-16 NOTE — [place] FL2 (Signed)
[place] MEDICAID FL2 LEVEL OF CARE SCREENING TOOL     IDENTIFICATION  Patient Name: Todd [name] Birthdate: [birth year] Sex: male Admission Date (Current Location): 07/14/2016  [place] and [place] Number:  Aaron Edelman [id number] P ([id number]) Facility and Address:  [facility],  [address], [place] [address]      Provider Number: [id number]  Attending Physician Name and Address:  Elliot Cousin, MD  Relative Name and Phone Number:       Current Level of Care: Hospital Recommended Level of Care: Skilled Nursing Facility Prior Approval Number:    Date Approved/Denied:   PASRR Number: [id number] ([id number])  Discharge Plan: SNF    Current Diagnoses: Patient Active Problem List   Diagnosis Date Noted  . Essential hypertension 07/15/2016  . UTI (urinary tract infection), bacterial 07/15/2016  . PAF (paroxysmal atrial fibrillation) (HCC) 07/15/2016  . Hypothyroidism 07/04/2016  . Seizure (HCC) 06/29/2016  . AKI (acute kidney injury) (HCC) 06/29/2016  . Acute encephalopathy   . Weight gain 08/02/2015  . Anemia, unspecified 06/22/2015  . Conjunctivitis 06/22/2015  . Unresponsive episode 03/20/2015  . Edema 03/11/2015  . Thrombocytopenia (HCC) 11/29/2014  . DVT (deep venous thrombosis) (HCC) 11/28/2014  . Hypokalemia 11/24/2014  . Syncope 10/16/2014  . CVA (cerebral infarction) history   . Altered mental status 08/18/2014  . Diabetes mellitus type 2, uncontrolled, with complications (HCC) 08/18/2014  . CAD (coronary atherosclerotic disease) 08/18/2014  . S/P CABG (coronary artery bypass graft) 08/18/2014  . CKD (chronic kidney disease), stage III 08/18/2014  . Dementia 08/18/2014  . HTN (hypertension) 08/18/2014  . Dyslipidemia 08/18/2014    Orientation RESPIRATION BLADDER Height & Weight     Place  Normal Incontinent Weight: 205 lb 7.5 oz (93.2 kg) Height:  5\' 10"  (177.8 cm)  BEHAVIORAL SYMPTOMS/MOOD NEUROLOGICAL BOWEL NUTRITION STATUS    Incontinent Diet (Carb Modified)  AMBULATORY STATUS COMMUNICATION OF NEEDS Skin   Limited Assist Verbally Normal                       Personal Care Assistance Level of Assistance  Bathing, Feeding, Dressing Bathing Assistance: Limited assistance Feeding assistance: Limited assistance Dressing Assistance: Limited assistance     Functional Limitations Info  Sight, Hearing, Speech Sight Info: Adequate Hearing Info: Adequate Speech Info: Adequate    SPECIAL CARE FACTORS FREQUENCY                       Contractures      Additional Factors Info  Code Status, Psychotropic, Insulin Sliding Scale, Isolation Precautions Code Status Info: DNR   Psychotropic Info: Depakote   Isolation Precautions Info: 07/16/16 Contact Precautions     Current Medications (07/16/2016):  This is the current hospital active medication list Current Facility-Administered Medications  Medication Dose Route Frequency Provider Last Rate Last Dose  . 0.45 % sodium chloride infusion   Intravenous Continuous Elliot Cousin, MD 50 mL/hr at 07/16/16 1243    . acetaminophen (TYLENOL) tablet 650 mg  650 mg Oral Q6H PRN Houston Siren, MD      . amLODipine (NORVASC) tablet 10 mg  10 mg Oral Daily Houston Siren, MD   10 mg at 07/16/16 0934  . cefTRIAXone (ROCEPHIN) 1 g in dextrose 5 % 50 mL IVPB  1 g Intravenous Q24H Houston Siren, MD   1 g at 07/15/16 2200  . folic acid (FOLVITE) tablet 1 mg  1 mg Oral Daily Houston Siren, MD   1 mg at  07/16/16 0934  . insulin aspart (novoLOG) injection 0-20 Units  0-20 Units Subcutaneous TID WC Elliot Cousinenise Fisher, MD      . insulin aspart (novoLOG) injection 0-5 Units  0-5 Units Subcutaneous QHS Elliot Cousinenise Fisher, MD      . insulin glargine (LANTUS) injection 17 Units  17 Units Subcutaneous QHS Elliot Cousinenise Fisher, MD      . levETIRAcetam (KEPPRA) tablet 500 mg  500 mg Oral BID Elliot Cousinenise Fisher, MD   500 mg at 07/16/16 0934  . levothyroxine (SYNTHROID, LEVOTHROID) tablet 50 mcg  50 mcg Oral QAC breakfast  Houston SirenPeter Le, MD   50 mcg at 07/16/16 0934  . lisinopril (PRINIVIL,ZESTRIL) tablet 40 mg  40 mg Oral Daily Houston SirenPeter Le, MD   40 mg at 07/16/16 0934  . pantoprazole (PROTONIX) EC tablet 40 mg  40 mg Oral Daily Elliot Cousinenise Fisher, MD   40 mg at 07/16/16 0934  . potassium chloride (KLOR-CON) packet 20 mEq  20 mEq Oral Daily Houston SirenPeter Le, MD   20 mEq at 07/16/16 [id number]     Discharge Medications: Please see discharge summary for a list of discharge medications.  Relevant Imaging Results:  Relevant Lab Results:   Additional Information    Viviana Trimble, Juleen ChinaHeather D, LCSW

## 2016-07-16 NOTE — Care Management Note (Signed)
Case Management Note  Patient Details  Name: Todd [name] MRN: [id number] Date of Birth: 08-Apr-1932  Subjective/Objective:                  Admitted with UTI and seizures. [facility] Anticipate pt will return to SNF at DC. CSW is aware and will make arrangements for return to facility.   Action/Plan: No CM needs anticipated. Anticipate DC on 2016-02-23.  Expected Discharge Date:      2016-02-23            Expected Discharge Plan:  Skilled Nursing Facility  In-House Referral:  Clinical Social Work  Discharge planning Services  CM Consult  Post Acute Care Choice:  NA Choice offered to:  NA  DME Arranged:    DME Agency:     HH Arranged:    HH Agency:     Status of Service:  Completed, signed off  If discussed at [employer] Meetings, dates discussed:    Additional Comments:  Malcolm MetroChildress, Camiya Vinal Demske, RN 07/16/2016, 1:31 PM

## 2016-07-16 NOTE — Telephone Encounter (Signed)
Seen by us 07/13/16,admitted to hospital 07/14/16,daughter would like to speak with dr branch,will forward to him

## 2016-07-16 NOTE — Progress Notes (Signed)
PROGRESS NOTE    Todd [name]  KZS:[id number] DOB: September 02, 1932 DOA: 07/14/2016 PCP: Alvester Chou, NP    Brief Narrative:  Patient is an 82 year old man with a history of dementia, CAD/status post CABG, CK D, HTN, DM-2, hypothyroidism, and recent left humeral fracture.[name] He was recently discharged on 07/01/16 with a diagnosis of seizure although his EEG was negative and neurology did not believe his AMS was secondary to a seizure disorder.  He presented from the [facility] with altered mental status. While in the ED, his vital signs were stable. His labs revealed BUN 50, creatinine 1.98, calcium 8.5, glucose 241, RBC 2.89, hemoglobin 9.1, HCT 28.7, lactic acid 3.2, and platelets 492. UA was indicative of infection. Patient was admitted for further evaluation of AMS and UTI.  Assessment & Plan:   Principal Problem:   Acute encephalopathy Active Problems:   Anemia, unspecified   UTI (urinary tract infection), bacterial   CKD (chronic kidney disease), stage III   Seizure (HCC)   AKI (acute kidney injury) (HCC)   PAF (paroxysmal atrial fibrillation) (HCC)   Diabetes mellitus type 2, uncontrolled, with complications (HCC)   CAD (coronary atherosclerotic disease)   CVA (cerebral infarction) history   Hypothyroidism   Essential hypertension    1. Acute encephalopathy. This is the patient's second hospitalization for altered mental status. During the previous hospitalization, there was a question about the etiology TIA versus seizure disorder. It was believed that he did not have an ongoing seizure disorder. -The etiology of his acute encephalopathy could be secondary to the UTI in the setting of chronic dementia and metabolic derangements. Seizure disorder with postictal encephalopathy is also a possibility. -Patient is likely at baseline as he is alert, but chronically confused.  ? Seizure disorder. During the previous hospitalization, patient was noted to present with AMS thought to be  secondary to a seizure. Neurology did not believe the patient met criteria for epilepsy and patient's EEG revealed no epileptiform activity. -The admitting physician believes that the patient does have a seizure disorder causing confusion. Therefore, Keppra was started. Depakote was discontinued (previously prescribed for mood disorder). -Keppra was continued, but the dose was decreased to 500 mg twice a day. There has been no reported seizure.  UTI. The patient's urinalysis was indicative of infection. Rocephin was started. Urine culture is growing out greater than 100,000 colonies of GNR. Blood cultures are negative to date.  Paroxysmal atrial fibrillation. Patient was seen by Dr. branch after a recent Holter monitor revealed episodes of PAF. CHADS2Vasc score is 6 per cardiology. Eliquis was started at 5 mg twice a day. -The patient had an unexplained nearly 2 g drop in his hemoglobin, so Eliquis and aspirin were withheld. -With improvement in his hemoglobin without transfusion and stool heme-negative 1, Eliquis will be restarted. We'll continue to hold ASA.  Acute on chronic anemia. Patient's baseline hemoglobin was 11-12 g 2 weeks ago. It had progressively fallen to the 8.2-9.1 range. Patient's hemoglobin was 9.1 on admission. It has fallen to 7.7. -Eliquis and aspirin were held on 9/17. Protonix was added empirically. Anemia panel revealed a normal total iron 46, low-normal ferritin of 64, and normal folate. His vitamin B12 level was within normal limits a few weeks ago. -His stool was brown and guaiac negative. We'll continue to monitor. I believe it is reasonable to restart the Eliquis, but not the aspirin.   Type 2 diabetes with nephropathy. Patient has a history of uncontrolled diabetes. His hemoglobin A1c was 9.1  05/2016. He is treated with Lantus and Humalog chronically. -Sliding scale NovoLog and Lantus were restarted. His CBGs are still uncontrolled. -We'll increase sliding scale  NovoLog and Lantus.  Hypertension. Patient is treated chronically with lisinopril and Norvasc. Will restart Norvasc.  Acute kidney injury superimposed on stage III chronic kidney disease. Patient's baseline creatinine is 1.35-1.4. It was 1.98 on admission. He is on ACE inhibitor, he was given IV fluids in the ED and his creatinine has improved back close to baseline. Lisinopril was continued. He was restarted on gentle IV fluids. -His creatinine has improved to better than baseline.  Hypothyroidism. Patient is treated with Synthroid. It was continued. His TSH is within normal limits.    DVT prophylaxis: Eliquis>> discontinued>> restarted. Code Status: DO NOT RESUSCITATE Family Communication: Family not available Disposition Plan: Discharge when clinically appropriate   Consultants:   None  Procedures:   None  Antimicrobials:   Rocephin 07/14/16>>    Subjective: Patient is alert and confused but denies chest pain or abdominal pain.  Objective: Vitals:   07/15/16 1312 07/15/16 1500 07/15/16 2107 07/16/16 0632  BP: (!) 134/50  (!) 127/50 (!) 159/58  Pulse: 69  64 73  Resp: _0 Temp: 98.2 F (36.8 C)  97.8 F (36.6 C) 98.3 F (36.8 C)  TempSrc: Oral  Oral Oral  SpO2: 95% 93% 99% 100%  Weight:    93.2 kg (205 lb 7.5 oz)  Height:        Intake/Output Summary (Last 24 hours) at 07/16/16 1232 Last data filed at 07/16/16 1015  Gross per 24 hour  Intake              480 ml  Output             1300 ml  Net             -820 ml   Filed Weights   07/14/16 1824 07/15/16 0000 07/16/16 6195  Weight: 86.2 kg (190 lb) 89.5 kg (197 lb 5 oz) 93.2 kg (205 lb 7.5 oz)    Examination:  General exam: Appears calm and comfortable, but confused.  Respiratory system: Coarse breath sounds decreased breath sounds in the bases. Respiratory effort normal. Cardiovascular system: S1 & S2 with 1 to 2/6 systolic murmur and occasional ectopy. No pedal edema. Gastrointestinal  system: Abdomen is nondistended, soft and nontender. No organomegaly or masses felt. Normal bowel sounds heard. GU: Rectal with palpable enlarged prostate; scant amount of brown stool in the rectal vault. Central nervous system: Alert and oriented to himself only. No focal neurological deficits. Extremities/musculoskeletal: There is a left upper extremity sling in place with proximal tenderness and edema. Skin: No rashes, lesions or ulcers Psychiatry: Judgement and insight are poor due to his dementia. His speech is intermittently clear.    Data Reviewed: I have personally reviewed following labs and imaging studies  CBC:  Recent Labs Lab 07/14/16 1901 07/15/16 0639 07/15/16 1349 07/16/16 0543  WBC 9.4 7.9  --  7.8  HGB 9.1* 7.7* 7.9* 8.5*  HCT 28.7* 23.7* 24.4* 25.6*  MCV 99.3 97.5  --  97.3  PLT 492* 442*  --  093*   Basic Metabolic Panel:  Recent Labs Lab 07/14/16 1901 07/15/16 0639 07/16/16 0543  NA 138 137 137  K 4.3 3.9 4.1  CL 104 106 105  CO2 _1 GLUCOSE 241* 205* 225*  BUN 50* 47* 39*  CREATININE 1.98* 1.49* 1.23  CALCIUM 8.5* 8.2*  8.4*   GFR: Estimated Creatinine Clearance: 53.1 mL/min (by C-G formula based on SCr of 1.23 mg/dL). Liver Function Tests:  Recent Labs Lab 07/14/16 1901 07/15/16 0639  AST 22 15  ALT 18 15*  ALKPHOS 123 120  BILITOT 0.5 0.6  PROT 6.4* 5.8*  ALBUMIN 2.9* 2.5*   No results for input(s): LIPASE, AMYLASE in the last 168 hours. No results for input(s): AMMONIA in the last 168 hours. Coagulation Profile:  Recent Labs Lab 07/14/16 1901  INR 1.25   Cardiac Enzymes:  Recent Labs Lab 07/14/16 1901  TROPONINI <0.03   BNP (last 3 results) No results for input(s): PROBNP in the last 8760 hours. HbA1C: No results for input(s): HGBA1C in the last 72 hours. CBG:  Recent Labs Lab 07/15/16 1111 07/15/16 1554 07/15/16 2106 07/16/16 0814 07/16/16 1140  GLUCAP 282* 322* 267* 218* 291*   Lipid Profile: No  results for input(s): CHOL, HDL, LDLCALC, TRIG, CHOLHDL, LDLDIRECT in the last 72 hours. Thyroid Function Tests:  Recent Labs  07/15/16 0639  TSH 2.376   Anemia Panel:  Recent Labs  07/15/16 1349  FOLATE 37.4  FERRITIN 64  TIBC 249*  IRON 46  RETICCTPCT 5.2*   Sepsis Labs:  Recent Labs Lab 07/14/16 2024  LATICACIDVEN 3.2*    Recent Results (from the past 240 hour(s))  Urine culture     Status: Abnormal (Preliminary result)   Collection Time: 07/14/16  6:52 PM  Result Value Ref Range Status   Specimen Description URINE, CATHETERIZED  Final   Special Requests NONE  Final   Culture >=100,000 COLONIES/mL GRAM NEGATIVE RODS (A)  Final   Report Status PENDING  Incomplete  Blood culture (routine x 2)     Status: None (Preliminary result)   Collection Time: 07/14/16  7:01 PM  Result Value Ref Range Status   Specimen Description BLOOD RIGHT HAND  Final   Special Requests BOTTLES DRAWN AEROBIC ONLY 2CC ONLY  Final   Culture NO GROWTH 2 DAYS  Final   Report Status PENDING  Incomplete  Blood culture (routine x 2)     Status: None (Preliminary result)   Collection Time: 07/14/16  8:24 PM  Result Value Ref Range Status   Specimen Description BLOOD LEFT HAND  Final   Special Requests BOTTLES DRAWN AEROBIC ONLY 6CC ONLY  Final   Culture NO GROWTH 2 DAYS  Final   Report Status PENDING  Incomplete         Radiology Studies: Ct Head Wo Contrast  Result Date: 07/14/2016 CLINICAL DATA:  Acute onset of altered mental status. Aphasia. Initial encounter. EXAM: CT HEAD WITHOUT CONTRAST TECHNIQUE: Contiguous axial images were obtained from the base of the skull through the vertex without intravenous contrast. COMPARISON:  CT of the head performed 07/01/2016 FINDINGS: Brain: No evidence of acute infarction, hemorrhage, hydrocephalus, extra-axial collection or mass lesion/mass effect. Prominence of the ventricles and sulci reflects moderate cortical volume loss. Cerebellar atrophy is  noted. Mild periventricular white matter change likely reflects small vessel ischemic microangiopathy. The brainstem and fourth ventricle are within normal limits. The basal ganglia are unremarkable in appearance. The cerebral hemispheres demonstrate grossly normal gray-white differentiation. No mass effect or midline shift is seen. Vascular: No hyperdense vessel or unexpected calcification. Skull: There is no evidence of fracture; visualized osseous structures are unremarkable in appearance. Sinuses/Orbits: The visualized portions of the orbits are within normal limits. There is opacification of the left side of the sphenoid sinus and left ethmoid air cells.  The remaining paranasal sinuses and mastoid air cells are well-aerated. Other: No significant soft tissue abnormalities are seen. IMPRESSION: 1. No acute intracranial pathology seen on CT. 2. Moderate cortical volume loss and scattered small vessel ischemic microangiopathy. 3. Opacification of the left side of the sphenoid sinus and left ethmoid air cells. Electronically Signed   By: Garald Balding M.D.   On: 07/14/2016 20:33   Dg Chest Portable 1 View  Result Date: 07/14/2016 CLINICAL DATA:  Altered mental status. EXAM: PORTABLE CHEST 1 VIEW COMPARISON:  07/01/2016 FINDINGS: Lordotic technique demonstrated. Sternotomy wires are unchanged. Lungs are adequately inflated without focal consolidation or effusion. Mild cardiomegaly. Old left humeral neck/ head fracture. IMPRESSION: No active disease. Mild cardiomegaly. Electronically Signed   By: Marin Olp M.D.   On: 07/14/2016 19:24        Scheduled Meds: . amLODipine  10 mg Oral Daily  . cefTRIAXone (ROCEPHIN)  IV  1 g Intravenous Q24H  . folic acid  1 mg Oral Daily  . insulin aspart  0-15 Units Subcutaneous TID WC  . insulin aspart  0-5 Units Subcutaneous QHS  . insulin glargine  12 Units Subcutaneous QHS  . levETIRAcetam  500 mg Oral BID  . levothyroxine  50 mcg Oral QAC breakfast  .  lisinopril  40 mg Oral Daily  . pantoprazole  40 mg Oral Daily  . potassium chloride  20 mEq Oral Daily   Continuous Infusions: . sodium chloride 65 mL/hr at 07/15/16 1200     LOS: 2 days    Time spent:30 minutes    Rexene Alberts, MD [facility] Pager [phone]  If 7PM-7AM, please contact night-coverage [email or web address] Password [facility] 07/16/2016, 12:32 PM

## 2016-07-16 NOTE — Care Management Important Message (Signed)
Important Message  Patient Details  Name: Todd [name] MRN: [id number] Date of Birth: 06-18-1932   Medicare Important Message Given:  Yes    Malcolm MetroChildress, Kensleigh Gates Demske, RN 07/16/2016, 1:34 PM

## 2016-07-17 ENCOUNTER — Inpatient Hospital Stay
Admission: RE | Admit: 2016-07-17 | Discharge: 2016-10-29 | Disposition: E | Payer: Medicare Other | Source: Ambulatory Visit | Attending: Internal Medicine | Admitting: Internal Medicine

## 2016-07-17 DIAGNOSIS — S81809A Unspecified open wound, unspecified lower leg, initial encounter: Principal | ICD-10-CM

## 2016-07-17 LAB — BASIC METABOLIC PANEL
ANION GAP: 6 (ref 5–15)
BUN: 30 mg/dL — ABNORMAL HIGH (ref 6–20)
CHLORIDE: 106 mmol/L (ref 101–111)
CO2: 25 mmol/L (ref 22–32)
Calcium: 8.5 mg/dL — ABNORMAL LOW (ref 8.9–10.3)
Creatinine, Ser: 1.08 mg/dL (ref 0.61–1.24)
GFR calc non Af Amer: >60 mL/min (ref >60)
GLUCOSE: 163 mg/dL — AB (ref 65–99)
Potassium: 4 mmol/L (ref 3.5–5.1)
Sodium: 137 mmol/L (ref 135–145)

## 2016-07-17 LAB — URINE CULTURE

## 2016-07-17 LAB — CBC
HEMATOCRIT: 28.8 % — AB (ref 39.0–52.0)
HEMOGLOBIN: 9.3 g/dL — AB (ref 13.0–17.0)
MCH: 31.3 pg (ref 26.0–34.0)
MCHC: 32.3 g/dL (ref 30.0–36.0)
MCV: 97 fL (ref 78.0–100.0)
Platelets: 511 10*3/uL — ABNORMAL HIGH (ref 150–400)
RBC: 2.97 MIL/uL — ABNORMAL LOW (ref 4.22–5.81)
RDW: 14 % (ref 11.5–15.5)
WBC: 9.1 10*3/uL (ref 4.0–10.5)

## 2016-07-17 LAB — GLUCOSE, CAPILLARY
Glucose-Capillary: 142 mg/dL — ABNORMAL HIGH (ref 65–99)
Glucose-Capillary: 223 mg/dL — ABNORMAL HIGH (ref 65–99)

## 2016-07-17 MED ORDER — INSULIN GLARGINE 100 UNIT/ML ~~LOC~~ SOLN: 18.0000 [IU] | Freq: Every day | SUBCUTANEOUS | Status: DC

## 2016-07-17 MED ORDER — LEVETIRACETAM 500 MG PO TABS: 500.0000 mg | ORAL_TABLET | Freq: Two times a day (BID) | ORAL | Status: DC

## 2016-07-17 MED ORDER — APIXABAN 5 MG PO TABS: 5.0000 mg | ORAL_TABLET | Freq: Two times a day (BID) | ORAL | Status: DC

## 2016-07-17 MED ORDER — PANTOPRAZOLE SODIUM 40 MG PO TBEC: 40.0000 mg | DELAYED_RELEASE_TABLET | Freq: Every day | ORAL | Status: AC

## 2016-07-17 MED ORDER — LEVETIRACETAM 250 MG PO TABS: 250.0000 mg | ORAL_TABLET | Freq: Two times a day (BID) | ORAL | Status: DC

## 2016-07-17 MED ORDER — CEFUROXIME AXETIL 500 MG PO TABS: 500.0000 mg | ORAL_TABLET | Freq: Two times a day (BID) | ORAL | Status: DC

## 2016-07-17 NOTE — Discharge Summary (Addendum)
Physician Discharge Summary  Clarence Cogswell JKD:[id number] DOB: 12-Dec-1931 DOA: 07/14/2016  PCP: Alvester Chou, NP  Admit date: 07/14/2016 Discharge date: 07/23/2016  Time spent: Greater than 30 minutes  Recommendations for Outpatient Follow-up:  1. Recommend follow-up of his hemoglobin/hematocrit. 2. Recommend checking his blood sugars once or twice daily. 3. Recommend appropriate follow-up with cardiology and orthopedics. 4. Patient is being discharged to [facility].    Discharge Diagnoses:  1. Acute encephalopathy, secondary to UTI vs. seizure, clinically undetermined.  2. Probable seizure disorder. 3. Klebsiella urinary tract infection. 4. AKI superimposed on stage III chronic kidney disease. 5. Anemia of chronic disease. 6. Paroxysmal H or fibrillation, on Eliquis. 7. HTN. 8. Type 2 diabetes. 9. History of CVA. 10. CAD. 11. Hypothyroidism. 12. Chronic dementia.  Discharge Condition: Improved.  Diet recommendation: Heart healthy/carbohydrate modified.  Filed Weights   07/14/16 1824 07/15/16 0000 07/16/16 0998  Weight: 86.2 kg (190 lb) 89.5 kg (197 lb 5 oz) 93.2 kg (205 lb 7.5 oz)    History of present illness:  Patient is an 82 year old man with a history of dementia, CAD/status post CABG, CK D, HTN, DM-2, hypothyroidism, and recent left humeral fracture.[name] He was recently discharged on 07/01/16 with a diagnosis of seizure although his EEG was negative and neurology did not believe his AMS was secondary to a seizure disorder.  He presented from the [facility] with altered mental status. While in the ED, his vital signs were stable. His labs revealed BUN 50, creatinine 1.98, calcium 8.5, glucose 241, RBC 2.89, hemoglobin 9.1, HCT 28.7, lactic acid 3.2, and platelets 492. UA was indicative of infection. Patient was admitted for further evaluation of AMS and UTI.  Hospital Course:  1. Acute encephalopathy. This was the patient's second recent hospitalization for altered  mental status. During the previous hospitalization, there was a question about the etiology TIA versus seizure disorder. It was believed that he did not have an ongoing seizure disorder. -The etiology of his acute encephalopathy could have been secondary to the UTI in the setting of chronic dementia and metabolic derangements. Seizure disorder with postictal encephalopathy was also a possibility. -Patient's mental status appears to have returned to baseline.  Possible Seizure disorder. During the previous hospitalization, patient was noted to present with AMS thought to be secondary to a seizure. Neurology did not believe the patient met criteria for epilepsy and patient's EEG revealed no epileptiform activity at that time. -The admitting physician believed that the patient was likely having seizures. Therefore, Keppra was started. Depakote was discontinued (previously prescribed for mood disorder). -Keppra was continued, but the dose was decreased to 500 mg twice a day. There was no reported seizure during the hospitalization.  UTI. The patient's urinalysis was indicative of infection. Rocephin was started. His urine culture grew out greater than 100,000 colonies of Klebsiella, sensitive to cephalosporins. -He was discharged on 4 more days of Ceftin for total of 7 days of treatment.  Paroxysmal atrial fibrillation. Patient was recently seen by cardiologist Dr. Harl Bowie after a recent Holter monitor revealed episodes of PAF. CHADS2Vasc score was 6 per cardiology. Eliquis was started at 5 mg twice a day. -The patient had an unexplained nearly 2 g drop in his hemoglobin, so Eliquis and aspirin were withheld. -With improvement in his hemoglobin without transfusion and stool heme-negative 1, Eliquis was restarted, but aspirin was discontinued.  Acute on chronic anemia. Patient's baseline hemoglobin was 11-12 g 2 weeks ago. It had progressively fallen to the 8.2-9.1 range. Patient's  hemoglobin was  9.1 on admission. It has fallen to 7.7. -Eliquis and aspirin were held on 9/17. Protonix was added empirically. Anemia panel revealed a normal total iron 46, low-normal ferritin of 64, and normal folate. His vitamin B12 level was within normal limits a few weeks ago. -His stool was brown and guaiac negative.  -His hemoglobin stabilized at 9.3 on the day of discharge. The etiology of the drop in his hemoglobin was unclear. -As stated above, Eliquis was restarted, but aspirin was discontinued. He was also started on Protonix which will be continued.  Type 2 diabetes with nephropathy. Patient has a history of uncontrolled diabetes. His hemoglobin A1c was 9.1  05/2016. He is treated chronically with Lantus and Humalog chronically. -Sliding scale NovoLog and Lantus were restarted and titrated accordingly. His CBGs improved. -His home Lantus dose was increased to 18 units at the time of discharge. -We'll defer further long-term management of his diabetes to his PCP.  Hypertension. Patient is treated chronically with lisinopril and Norvasc. They were both restarted. His blood pressure was controlled.  Acute kidney injury superimposed on stage III chronic kidney disease. Patient's baseline creatinine is 1.35-1.4. It was 1.98 on admission. He is treated chronically with and ACE inhibitor. It was continued, but he was started on gentle IV fluids. His creatinine improved to 1.08.  Hypothyroidism. Patient is treated with Synthroid. It was continued. His TSH was within normal limits.  Procedures:  None  Consultations:  None  Discharge Exam: Vitals:   07/16/16 2143 07/06/2016 0500  BP: 140/68 135/65  Pulse: 86 85  Resp: 20 20  Temp: 98 F (36.7 C) 98 F (36.7 C)  Oxygen saturation 100%.  General exam: Appears calm and comfortable.He is more alert and his speech is clearer.  Respiratory system: Coarse breath sounds decreased breath sounds in the bases. Respiratory effort  normal. Cardiovascular system: S1 & S2 with 1 to 2/6 systolic murmur. No pedal edema. Gastrointestinal system: Abdomen is nondistended, soft and nontender. No organomegaly or masses felt. Normal bowel sounds heard. Central nervous system: Alert and oriented to himself only. No focal neurological deficits. Extremities/musculoskeletal: There is a left upper extremity sling in place with proximal tenderness and edema.   Discharge Instructions   Discharge Instructions    Diet - low sodium heart healthy    Complete by:  As directed    Diet general    Complete by:  As directed    Increase activity slowly    Complete by:  As directed      Current Discharge Medication List    START taking these medications   Details  cefUROXime (CEFTIN) 500 MG tablet Take 1 tablet (500 mg total) by mouth 2 (two) times daily with a meal. Antibiotic to be taken for 5 more days.    levETIRAcetam (KEPPRA) 500 MG tablet Take 1 tablet (500 mg total) by mouth 2 (two) times daily.    pantoprazole (PROTONIX) 40 MG tablet Take 1 tablet (40 mg total) by mouth daily.      CONTINUE these medications which have CHANGED   Details  insulin glargine (LANTUS) 100 UNIT/ML injection Inject 0.18 mLs (18 Units total) into the skin at bedtime.      CONTINUE these medications which have NOT CHANGED   Details  acetaminophen (TYLENOL) 325 MG tablet Take 650 mg by mouth every 6 (six) hours as needed.    amLODipine (NORVASC) 10 MG tablet Take 10 mg by mouth daily.    apixaban (ELIQUIS) 5 MG  TABS tablet Take 1 tablet (5 mg total) by mouth 2 (two) times daily. Qty: 60 tablet, Refills: 6    Cholecalciferol (VITAMIN D-3) 1000 UNITS CAPS Take 1,000 Units by mouth daily.    folic acid (FOLVITE) 1 MG tablet Take 1 mg by mouth daily.    insulin lispro (HUMALOG) 100 UNIT/ML injection Inject 5 Units into the skin 2 (two) times daily with a meal. With Lunch and Evening Meal    levothyroxine (SYNTHROID, LEVOTHROID) 25 MCG tablet  Take 2 tablets (50 mcg total) by mouth daily before breakfast. Qty: 60 tablet, Refills: 1    lisinopril (PRINIVIL,ZESTRIL) 40 MG tablet Take 40 mg by mouth daily.    potassium chloride (KLOR-CON) 20 MEQ packet Take 20 mEq by mouth daily.       STOP taking these medications     aspirin EC 81 MG tablet      divalproex (DEPAKOTE) 500 MG DR tablet        No Known Allergies    The results of significant diagnostics from this hospitalization (including imaging, microbiology, ancillary and laboratory) are listed below for reference.    Significant Diagnostic Studies: Dg Chest 1 View  Result Date: 07/01/2016 CLINICAL DATA:  Fall EXAM: CHEST 1 VIEW COMPARISON:  Portable exam 1603 hours compared to 03/16/2015 FINDINGS: Normal heart size, mediastinal contours, and pulmonary vascularity. Postsurgical changes of CABG. Atherosclerotic calcification aorta. Lungs clear. No pleural effusion or pneumothorax. Bones demineralized. Displaced oblique fracture of the proximal LEFT humerus again identified. IMPRESSION: No acute abnormalities. Post CABG. Aortic atherosclerosis. Electronically Signed   By: Lavonia Dana M.D.   On: 07/01/2016 16:34   Dg Pelvis 1-2 Views  Result Date: 07/01/2016 CLINICAL DATA:  Fall EXAM: PELVIS - 1-2 VIEW COMPARISON:  02/15/2016 FINDINGS: Diffuse osseous demineralization. BILATERAL hip joint space narrowing slightly greater on RIGHT. SI joints preserved. Degenerative disc disease changes at lower lumbar spine. No definite acute fracture, dislocation, or bone destruction. Scattered atherosclerotic calcifications. IMPRESSION: Osseous demineralization with mild degenerative changes of both hips and more advanced degenerative changes at the visualized lower lumbar spine. No acute osseous abnormalities. Electronically Signed   By: Lavonia Dana M.D.   On: 07/01/2016 16:33   Ct Head Wo Contrast  Result Date: 07/14/2016 CLINICAL DATA:  Acute onset of altered mental status. Aphasia. Initial  encounter. EXAM: CT HEAD WITHOUT CONTRAST TECHNIQUE: Contiguous axial images were obtained from the base of the skull through the vertex without intravenous contrast. COMPARISON:  CT of the head performed 07/01/2016 FINDINGS: Brain: No evidence of acute infarction, hemorrhage, hydrocephalus, extra-axial collection or mass lesion/mass effect. Prominence of the ventricles and sulci reflects moderate cortical volume loss. Cerebellar atrophy is noted. Mild periventricular white matter change likely reflects small vessel ischemic microangiopathy. The brainstem and fourth ventricle are within normal limits. The basal ganglia are unremarkable in appearance. The cerebral hemispheres demonstrate grossly normal gray-white differentiation. No mass effect or midline shift is seen. Vascular: No hyperdense vessel or unexpected calcification. Skull: There is no evidence of fracture; visualized osseous structures are unremarkable in appearance. Sinuses/Orbits: The visualized portions of the orbits are within normal limits. There is opacification of the left side of the sphenoid sinus and left ethmoid air cells. The remaining paranasal sinuses and mastoid air cells are well-aerated. Other: No significant soft tissue abnormalities are seen. IMPRESSION: 1. No acute intracranial pathology seen on CT. 2. Moderate cortical volume loss and scattered small vessel ischemic microangiopathy. 3. Opacification of the left side of the sphenoid sinus  and left ethmoid air cells. Electronically Signed   By: Garald Balding M.D.   On: 07/14/2016 20:33   Ct Head Wo Contrast  Result Date: 07/01/2016 CLINICAL DATA:  [facility] staff reports pt was discharged today from [facility] and when he arrived to [facility] they noticed he had swelling, bruising, and blisters to left arm. They did an xray and found that pt has a humerus fracture. EXAM: CT HEAD WITHOUT CONTRAST TECHNIQUE: Contiguous axial images were obtained from the base of the skull through the  vertex without intravenous contrast. COMPARISON:  06/28/2016 FINDINGS: Brain: There is significant central and cortical atrophy. Periventricular white matter changes are consistent with small vessel disease. An extra-axial calcified mass is identified adjacent to the right frontal bone measuring 8 mm and stable in appearance compared with prior study. There is no associated edema on, mass-effect, or hemorrhage. Vascular: Significant calcification of the internal carotid arteries. Skull: No calvarial fracture. Sinuses/Orbits: Significant opacification of the left sphenoid air cell and multiple bilateral ethmoid air cells. Mastoid air cells are normally aerated. Other: None IMPRESSION: 1. Atrophy and small vessel disease. 2.  No evidence for acute  abnormality. 3. Stable right frontal calcified mass measuring 8 mm. This may represent a small meningioma or meningeal calcification and appears stable. Electronically Signed   By: Nolon Nations M.D.   On: 07/01/2016 16:59   Mr Jeri Cos JS Contrast  Result Date: 06/29/2016 CLINICAL DATA:  Confusion.  Left-sided weakness and slurred speech EXAM: MRI HEAD WITHOUT AND WITH CONTRAST TECHNIQUE: Multiplanar, multiecho pulse sequences of the brain and surrounding structures were obtained without and with intravenous contrast. CONTRAST:  42m MULTIHANCE GADOBENATE DIMEGLUMINE 529 MG/ML IV SOLN COMPARISON:  CT head 06/28/2016.  MRI 10/04/2014 FINDINGS: Negative for acute infarct. Moderate atrophy. Mild chronic white matter changes. Small chronic infarct right paramedian pons. Normal cerebellum. Mild amount of hemosiderin staining along the cortex of the right frontal parietal lobe likely due to chronic mild subarachnoid hemorrhage. No acute hemorrhage is seen on yesterday's CT. No fluid collection or mass-effect Negative for mass or edema. Postcontrast imaging demonstrates normal enhancement. Small dural base calcification high right frontal lobe does not enhance and may be  dural calcification or small calcified meningioma. Postcontrast imaging is degraded by motion. Extensive mucosal edema in the paranasal sinuses with complete opacification of the left sphenoid sinus and extensive mucosal edema in the left ethmoid sinus. Mucosal edema in the maxillary sinuses left greater than right. This is chronic and unchanged from 2015. No orbital lesion identified.  Pituitary not enlarged. IMPRESSION: Negative for acute infarct. Atrophy and mild chronic ischemic change Chronic sinusitis Electronically Signed   By: CFranchot GalloM.D.   On: 06/29/2016 10:56   Dg Chest Portable 1 View  Result Date: 07/14/2016 CLINICAL DATA:  Altered mental status. EXAM: PORTABLE CHEST 1 VIEW COMPARISON:  07/01/2016 FINDINGS: Lordotic technique demonstrated. Sternotomy wires are unchanged. Lungs are adequately inflated without focal consolidation or effusion. Mild cardiomegaly. Old left humeral neck/ head fracture. IMPRESSION: No active disease. Mild cardiomegaly. Electronically Signed   By: DMarin OlpM.D.   On: 07/14/2016 19:24   Dg Shoulder Left  Result Date: 07/11/2016 Clinical:  History of fracture of the left proximal humerus X-rays were done of the proximal left humerus, two views There is a comminuted proximal humeral fracture with some displacement.  The humeral head is located.  There is a butterfly fragment of the anterior proximal humerus also present and slightly displaced. Impression:  Comminuted  proximal humeral fracture with some displacement and butterfly fragment also present. Electronically Signed Sanjuana Kava, MD 9/13/20172:15 PM   Dg Shoulder Left  Result Date: 07/01/2016 CLINICAL DATA:  Fall EXAM: LEFT SHOULDER - 2+ VIEW COMPARISON:  None FINDINGS: Osseous demineralization. AC joint alignment normal. Oblique fracture at surgical neck LEFT humerus extending down the proximal diaphysis, mildly displaced. No definite dislocation. No additional fracture or bone destruction seen.  Visualized LEFT ribs intact. IMPRESSION: Displaced oblique fracture at surgical neck LEFT humerus extending into the proximal diaphysis. Electronically Signed   By: Lavonia Dana M.D.   On: 07/01/2016 16:54   Dg Humerus Left  Result Date: 07/01/2016 CLINICAL DATA:  Swelling, bruising and blisters on LEFT arm, recent discharge from hospital, humeral fracture EXAM: LEFT HUMERUS - 2+ VIEW COMPARISON:  None FINDINGS: Osseous demineralization. AC joint alignment normal. Elbow and glenohumeral joint alignments grossly normal Displaced oblique fracture of the surgical neck LEFT humerus extending a short way down the proximal shaft. No additional fracture or dislocation. Bony excrescence question osteochondroma at distal LEFT humeral metaphysis. IMPRESSION: Displaced oblique fracture of the proximal LEFT humerus. 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 Electronically Signed   By: Lavonia Dana M.D.   On: 07/01/2016 16:32   Ct Head Code Stroke Wo Contrast`  Addendum Date: 06/29/2016   ADDENDUM REPORT: 06/29/2016 00:50 ADDENDUM: These results were called by telephone at the time of interpretation on 06/28/2016 at 11:35 p.m. to Dr. Rolland Porter , who verbally acknowledged these results. Electronically Signed   By: Ulyses Jarred M.D.   On: 06/29/2016 00:50   Result Date: 06/29/2016 CLINICAL DATA:  Code stroke.  Sudden onset left-sided weakness EXAM: CT HEAD WITHOUT CONTRAST TECHNIQUE: Contiguous axial images were obtained from the base of the skull through the vertex without intravenous contrast. COMPARISON:  Head CT 10/02/2015 FINDINGS: Cortical gray-white differentiation is preserved. The basal ganglia and insular cortices are normal. No extra-axial collection, subarachnoid hemorrhage or intraparenchymal hematoma. No mass lesion, midline shift or hydrocephalus.B there is a small dural-based calcification along the right frontal convexity, possibly a small meningioma. Complete opacification of the left sphenoid  sinus and posterior ethmoid air cells, unchanged. Mastoids are clear. Normal orbits. ASPECTS [facility] Stroke Program Early CT Score) - Ganglionic level infarction (caudate, lentiform nuclei, internal capsule, insula, M1-M3 cortex): 7 - Supraganglionic infarction (M4-M6 cortex): 3 Total score (0-10 with 10 being normal): 10 IMPRESSION: 1. No acute intracranial hemorrhage. 2. ASPECTS is 10.  No CT evidence of acute cortical infarct. 3. Small dural base calcification along the right frontal convexity, possibly a small meningioma. Electronically Signed: By: Ulyses Jarred M.D. On: 06/28/2016 23:38    Microbiology: Recent Results (from the past 240 hour(s))  Urine culture     Status: Abnormal   Collection Time: 07/14/16  6:52 PM  Result Value Ref Range Status   Specimen Description URINE, CATHETERIZED  Final   Special Requests NONE  Final   Culture >=100,000 COLONIES/mL KLEBSIELLA PNEUMONIAE (A)  Final   Report Status 07/02/2016 FINAL  Final   Organism ID, Bacteria KLEBSIELLA PNEUMONIAE (A)  Final      Susceptibility   Klebsiella pneumoniae - MIC*    AMPICILLIN >=32 RESISTANT Resistant     CEFAZOLIN <=4 SENSITIVE Sensitive     CEFTRIAXONE <=1 SENSITIVE Sensitive     CIPROFLOXACIN <=0.25 SENSITIVE Sensitive     GENTAMICIN <=1 SENSITIVE Sensitive     IMIPENEM <=0.25 SENSITIVE Sensitive     NITROFURANTOIN <=16 SENSITIVE Sensitive  TRIMETH/SULFA <=20 SENSITIVE Sensitive     AMPICILLIN/SULBACTAM 4 SENSITIVE Sensitive     PIP/TAZO <=4 SENSITIVE Sensitive     Extended ESBL NEGATIVE Sensitive     * >=100,000 COLONIES/mL KLEBSIELLA PNEUMONIAE  Blood culture (routine x 2)     Status: None (Preliminary result)   Collection Time: 07/14/16  7:01 PM  Result Value Ref Range Status   Specimen Description BLOOD RIGHT HAND  Final   Special Requests BOTTLES DRAWN AEROBIC ONLY 2CC ONLY  Final   Culture NO GROWTH 3 DAYS  Final   Report Status PENDING  Incomplete  Blood culture (routine x 2)     Status:  None (Preliminary result)   Collection Time: 07/14/16  8:24 PM  Result Value Ref Range Status   Specimen Description BLOOD LEFT HAND  Final   Special Requests BOTTLES DRAWN AEROBIC ONLY 6CC ONLY  Final   Culture NO GROWTH 3 DAYS  Final   Report Status PENDING  Incomplete     Labs: Basic Metabolic Panel:  Recent Labs Lab 07/14/16 1901 07/15/16 0639 07/16/16 0543 07/28/2016 0703  NA 138 137 137 137  K 4.3 3.9 4.1 4.0  CL 104 106 105 106  CO2 _0 GLUCOSE 241* 205* 225* 163*  BUN 50* 47* 39* 30*  CREATININE 1.98* 1.49* 1.23 1.08  CALCIUM 8.5* 8.2* 8.4* 8.5*   Liver Function Tests:  Recent Labs Lab 07/14/16 1901 07/15/16 0639  AST 22 15  ALT 18 15*  ALKPHOS 123 120  BILITOT 0.5 0.6  PROT 6.4* 5.8*  ALBUMIN 2.9* 2.5*   No results for input(s): LIPASE, AMYLASE in the last 168 hours. No results for input(s): AMMONIA in the last 168 hours. CBC:  Recent Labs Lab 07/14/16 1901 07/15/16 0639 07/15/16 1349 07/16/16 0543 07/07/2016 0703  WBC 9.4 7.9  --  7.8 9.1  HGB 9.1* 7.7* 7.9* 8.5* 9.3*  HCT 28.7* 23.7* 24.4* 25.6* 28.8*  MCV 99.3 97.5  --  97.3 97.0  PLT 492* 442*  --  489* 511*   Cardiac Enzymes:  Recent Labs Lab 07/14/16 1901  TROPONINI <0.03   BNP: BNP (last 3 results)  Recent Labs  10/15/15 1145 01/04/16 0720 02/03/16 0900  BNP 101.0* 63.0 116.0*    ProBNP (last 3 results) No results for input(s): PROBNP in the last 8760 hours.  CBG:  Recent Labs Lab 07/16/16 0814 07/16/16 1140 07/16/16 1720 07/16/16 2142 07/03/2016 0755  GLUCAP 218* 291* 173* 257* 142*       Signed:  Byrdie Miyazaki MD.  [facility] 07/12/2016, 12:38 PM

## 2016-07-17 NOTE — Progress Notes (Signed)
Called report to PCN Kathie RhodesBetty  RN regarding pt's return to facility. Pt information and education explained. Pt going to rm 104. Pt taken back to PCN by staff member. VSS. Lesly Dukesachel J Everett, RN

## 2016-07-17 NOTE — Clinical Social Work Note (Signed)
CSW left a voicemail message for patient's daughter advising that patient is being discharged today and will return to the facility.  CSW [facility] advising [facility] was discharging and would be returning to the facility today.   CSW sent clinicals via [employer].   CSW signing off.     Kilee Hedding, Juleen ChinaHeather D, LCSW

## 2016-07-18 ENCOUNTER — Encounter: Payer: Self-pay | Admitting: Internal Medicine

## 2016-07-18 ENCOUNTER — Other Ambulatory Visit (HOSPITAL_COMMUNITY)
Admission: AD | Admit: 2016-07-18 | Discharge: 2016-07-18 | Disposition: A | Discharge status: Home / Self Care | Payer: Medicare Other | Source: Skilled Nursing Facility | Attending: Internal Medicine | Admitting: Internal Medicine

## 2016-07-18 ENCOUNTER — Non-Acute Institutional Stay (SKILLED_NURSING_FACILITY): Payer: Medicare Other | Admitting: Internal Medicine

## 2016-07-18 DIAGNOSIS — N390 Urinary tract infection, site not specified: Secondary | ICD-10-CM | POA: Diagnosis not present

## 2016-07-18 DIAGNOSIS — E1165 Type 2 diabetes mellitus with hyperglycemia: Secondary | ICD-10-CM

## 2016-07-18 DIAGNOSIS — S42302S Unspecified fracture of shaft of humerus, left arm, sequela: Secondary | ICD-10-CM | POA: Diagnosis not present

## 2016-07-18 DIAGNOSIS — E118 Type 2 diabetes mellitus with unspecified complications: Secondary | ICD-10-CM

## 2016-07-18 DIAGNOSIS — D649 Anemia, unspecified: Secondary | ICD-10-CM

## 2016-07-18 DIAGNOSIS — I480 Paroxysmal atrial fibrillation: Secondary | ICD-10-CM

## 2016-07-18 DIAGNOSIS — I10 Essential (primary) hypertension: Secondary | ICD-10-CM

## 2016-07-18 DIAGNOSIS — A499 Bacterial infection, unspecified: Secondary | ICD-10-CM | POA: Diagnosis not present

## 2016-07-18 DIAGNOSIS — S42302A Unspecified fracture of shaft of humerus, left arm, initial encounter for closed fracture: Secondary | ICD-10-CM | POA: Insufficient documentation

## 2016-07-18 DIAGNOSIS — R509 Fever, unspecified: Secondary | ICD-10-CM

## 2016-07-18 DIAGNOSIS — F0391 Unspecified dementia with behavioral disturbance: Secondary | ICD-10-CM

## 2016-07-18 LAB — CBC WITH DIFFERENTIAL/PLATELET
Basophils Absolute: 0 10*3/uL (ref 0.0–0.1)
Basophils Relative: 0 %
Eosinophils Absolute: 0 10*3/uL (ref 0.0–0.7)
Eosinophils Relative: 0 %
HEMATOCRIT: 24.8 % — AB (ref 39.0–52.0)
HEMOGLOBIN: 7.9 g/dL — AB (ref 13.0–17.0)
LYMPHS ABS: 1.4 10*3/uL (ref 0.7–4.0)
Lymphocytes Relative: 15 %
MCH: 31.1 pg (ref 26.0–34.0)
MCHC: 31.9 g/dL (ref 30.0–36.0)
MCV: 97.6 fL (ref 78.0–100.0)
MONO ABS: 1.1 10*3/uL — AB (ref 0.1–1.0)
MONOS PCT: 13 %
NEUTROS ABS: 6.4 10*3/uL (ref 1.7–7.7)
NEUTROS PCT: 72 %
Platelets: 464 10*3/uL — ABNORMAL HIGH (ref 150–400)
RBC: 2.54 MIL/uL — ABNORMAL LOW (ref 4.22–5.81)
RDW: 14.4 % (ref 11.5–15.5)
WBC: 9 10*3/uL (ref 4.0–10.5)

## 2016-07-18 NOTE — Telephone Encounter (Signed)
Called pt's daughter. Left voicemail asking her to return my call.

## 2016-07-18 NOTE — Progress Notes (Addendum)
Provider:  Einar CrowAnjali,Alexes Menchaca Location:   [facility] Room Number: 104/D Place of Service:  SNF (31)  PCP: Marletta LorBarr, Julie, NP Patient Care Team: Marletta LorJulie Barr, NP as PCP - General (Nurse Practitioner)  Extended Emergency Contact Information Primary Emergency Contact: Welborne,[name] Address: [address]          [place], [place] [id number] [place] Home Phone: [phone] Mobile Phone: [phone] Relation: Daughter Secondary Emergency Contact: Gibson RampWelborne,Gloria Address: [address]          [place], [place] [address] [place] Home Phone: [phone] Mobile Phone: [phone] Relation: Relative  Code Status: DNR Goals of Care: Advanced Directive information Advanced Directives 07/18/2016  Does patient have an advance directive? Yes  Type of Advance Directive Out of facility DNR (pink MOST or yellow form)  Does patient want to make changes to advanced directive? No - Patient declined  Copy of advanced directive(s) in chart? Yes  Pre-existing out of facility DNR order (yellow form or pink MOST form) -      Chief Complaint  Patient presents with  . Readmit To SNF    HPI: Patient is a 82 y.o. male seen today for Readmission to SNF for LTC. Patient was admitted in the hospital for change in mental status. The Encephalopathy was thought to be secondary to Questionable UTI, or Being Post ictal secondary to episode of seizures. Patient was started on Ceftin to treat UTI. His Depakote was stopped and he was restarted on Keppra again. He was Hydrated and also restated on Eliquis for his PAF which was documented by Heart monitor. Patient this morning has fever again he is somnolent but easily arousable. He was tachycardic but HR came down with tylenol. Nurses have not heard any cough. And he has not been SOB. Unable to get any more history due to Patients Dementia.  Past Medical History:  Diagnosis Date  . Arthritis    "probably"  .  Chronic kidney disease    "? stage" (08/18/2014)  . Chronic kidney disease   . Dementia    "don't know stage or type" (08/18/2014)  . Elevated cholesterol   . Hypertension   . PAF (paroxysmal atrial fibrillation) (HCC) 07/15/2016  . Seizures (HCC)   . Stroke (HCC)   . Type II diabetes mellitus (HCC)    Past Surgical History:  Procedure Laterality Date  . CATARACT EXTRACTION    . CORONARY ARTERY BYPASS GRAFT  ?1994   "CABG X3"  . TONSILLECTOMY      reports that he has quit smoking. His smoking use included Pipe. He has never used smokeless tobacco. He reports that he does not drink alcohol or use drugs. Social History   Social History  . Marital status: Widowed    Spouse name: N/A  . Number of children: N/A  . Years of education: N/A   Occupational History  . Not on file.   Social History Main Topics  . Smoking status: Former Smoker    Types: Pipe  . Smokeless tobacco: Never Used     Comment: "quit smoking a pipe in the 80's"  . Alcohol use No  . Drug use: No  . Sexual activity: Not on file   Other Topics Concern  . Not on file   Social History Narrative  . No narrative on file    Functional Status Survey:    History reviewed. No pertinent family history.  Health Maintenance  Topic Date Due  .  INFLUENZA VACCINE  09/28/2016 (Originally 05/29/2016)  . PNA vac Low Risk Adult (2 of 2 - PCV13) 10/29/2016 (Originally 11/23/1999)  . ZOSTAVAX  10/30/2023 (Originally 11/22/1993)  . TETANUS/TDAP  10/30/2023 (Originally 11/22/1950)  . OPHTHALMOLOGY EXAM  08/07/2016  . FOOT EXAM  10/13/2016  . HEMOGLOBIN A1C  11/30/2016  . URINE MICROALBUMIN  05/29/2017    No Known Allergies  Current Outpatient Prescriptions on File Prior to Visit  Medication Sig Dispense Refill  . acetaminophen (TYLENOL) 325 MG tablet Take 650 mg by mouth every 6 (six) hours as needed.    [name] amLODipine (NORVASC) 10 MG tablet Take 10 mg by mouth daily.    [name] apixaban (ELIQUIS) 5 MG TABS tablet Take 1  tablet (5 mg total) by mouth 2 (two) times daily. 60 tablet 6  . cefUROXime (CEFTIN) 500 MG tablet Take 1 tablet (500 mg total) by mouth 2 (two) times daily with a meal. Antibiotic to be taken for 5 more days.    . Cholecalciferol (VITAMIN D-3) 1000 UNITS CAPS Take 1,000 Units by mouth daily.    . folic acid (FOLVITE) 1 MG tablet Take 1 mg by mouth daily.    . insulin glargine (LANTUS) 100 UNIT/ML injection Inject 0.18 mLs (18 Units total) into the skin at bedtime.    . insulin lispro (HUMALOG) 100 UNIT/ML injection Inject 5 Units into the skin 2 (two) times daily with a meal. With Lunch and Evening Meal    . levETIRAcetam (KEPPRA) 500 MG tablet Take 1 tablet (500 mg total) by mouth 2 (two) times daily.    [name] levothyroxine (SYNTHROID, LEVOTHROID) 25 MCG tablet Take 2 tablets (50 mcg total) by mouth daily before breakfast. 60 tablet 1  . lisinopril (PRINIVIL,ZESTRIL) 40 MG tablet Take 40 mg by mouth daily.    . pantoprazole (PROTONIX) 40 MG tablet Take 1 tablet (40 mg total) by mouth daily.    . potassium chloride (KLOR-CON) 20 MEQ packet Take 20 mEq by mouth daily.      No current facility-administered medications on file prior to visit.      Review of Systems  Unable to perform ROS: Dementia    Vitals:   07/18/16 1004  BP: (!) 141/51  Pulse: (!) 130 After tylenol it was 86 POX 98%  Resp: (!) 22  Temp: (!) 100.5 F (38.1 C)  TempSrc: Oral   There is no height or weight on file to calculate BMI. Physical Exam  Constitutional: He appears well-developed and well-nourished.  HENT:  Head: Normocephalic.  Cardiovascular: Normal rate, regular rhythm and normal heart sounds.   Pulmonary/Chest: Effort normal and breath sounds normal. He has no wheezes. He has no rales.  Couldn't examine properly as patient was trying to hit the nurse holding him  Abdominal: Soft. Bowel sounds are normal. He exhibits no distension. There is no tenderness. There is no rebound.  Musculoskeletal: He exhibits  edema.  Trace edema B/L  Neurological: He is alert.    Labs reviewed: Basic Metabolic Panel:  Recent Labs  16/10/96 0639 07/16/16 0543 07/12/2016 0703  NA 137 137 137  K 3.9 4.1 4.0  CL 106 105 106  CO2 24 24 25   GLUCOSE 205* 225* 163*  BUN 47* 39* 30*  CREATININE 1.49* 1.23 1.08  CALCIUM 8.2* 8.4* 8.5*   Liver Function Tests:  Recent Labs  07/01/16 1602 07/14/16 1901 07/15/16 0639  AST 25 22 15   ALT 5* 18 15*  ALKPHOS 46 123 120  BILITOT 0.7 0.5  0.6  PROT 6.3* 6.4* 5.8*  ALBUMIN 3.3* 2.9* 2.5*   No results for input(s): LIPASE, AMYLASE in the last 8760 hours. No results for input(s): AMMONIA in the last 8760 hours. CBC:  Recent Labs  07/01/16 1602 07/03/16 0710  07/09/16 0625  07/15/16 0639 07/15/16 1349 07/16/16 0543 07/20/2016 0703  WBC 7.6 11.3*  < > 9.3  < > 7.9  --  7.8 9.1  NEUTROABS 5.8 7.6  --  6.7  --   --   --   --   --   HGB 11.0* 8.9*  < > 8.2*  < > 7.7* 7.9* 8.5* 9.3*  HCT 32.5* 25.9*  < > 24.8*  < > 23.7* 24.4* 25.6* 28.8*  MCV 94.8 94.5  < > 98.0  < > 97.5  --  97.3 97.0  PLT 183 171  < > 342  < > 442*  --  489* 511*  < > = values in this interval not displayed. Cardiac Enzymes:  Recent Labs  06/29/16 0814 06/29/16 1943 07/14/16 1901  TROPONINI <0.03 <0.03 <0.03   BNP: Invalid input(s): POCBNP Lab Results  Component Value Date   HGBA1C 9.1 (H) 05/30/2016   Lab Results  Component Value Date   TSH 2.376 07/15/2016   Lab Results  Component Value Date   VITAMINB12 611 06/30/2016   Lab Results  Component Value Date   FOLATE 37.4 07/15/2016   Lab Results  Component Value Date   IRON 46 07/15/2016   TIBC 249 (L) 07/15/2016   FERRITIN 64 07/15/2016    Imaging and Procedures obtained prior to SNF admission: No results found.  Assessment/Plan  Fever  Patient has low grade fever in spite of getting treated for UTI And already on Antibiotics right now.  Will Get CXRay to R/o Pneumonia Vitals signs Q 4 hours Repeat CBC  and BMP. Will D/W patients daughter for further plans for patient.  Will follow him clinically right now.  UTI (urinary tract infection)  Patient on Cefitin for 7 more days.  Uncontrolled type 2 diabetes mellitus   Patient on 18 units of lantus and Humalog insulin with meals. His BS in facility are running higher then 250 . Will increase his lantus to 22 units. Continue sliding scale with meals. Accu check QAC and QHS.  PAF (paroxysmal atrial fibrillation)   Patient is on Eliquis. Follow HGB as he had drop in his HGB for unknown reason as he was occult stool negative.  Essential hypertension  Was restarted on Lisinopril and Norvasc. He is stable.  Anemia  The Iron studies were non specific. HGB came back to 9.3  Will continue to monitor CBC.  Closed fracture of left humerus  Patient on sling Needs Follow up with ORTH[facility].      Family/ staff Communication:  Discussed in detail with the daughter she does not want her dad to be send to the hospital at this time. She does want everything else to stay same. Will also like to talk to Palliative care nurse. Will continue Tylenol for fever and keep patient comfortable in the facility. Will also hold Eliquis as his HGB is low again to 7.9. Labs/tests ordered: Chest Xray No acute infiltrate. CBC with Diff BMP.

## 2016-07-19 ENCOUNTER — Encounter: Payer: Self-pay | Admitting: Internal Medicine

## 2016-07-19 ENCOUNTER — Non-Acute Institutional Stay (SKILLED_NURSING_FACILITY): Payer: Medicare Other | Admitting: Internal Medicine

## 2016-07-19 DIAGNOSIS — I480 Paroxysmal atrial fibrillation: Secondary | ICD-10-CM | POA: Diagnosis not present

## 2016-07-19 DIAGNOSIS — R509 Fever, unspecified: Secondary | ICD-10-CM | POA: Diagnosis not present

## 2016-07-19 DIAGNOSIS — N390 Urinary tract infection, site not specified: Secondary | ICD-10-CM

## 2016-07-19 DIAGNOSIS — A499 Bacterial infection, unspecified: Secondary | ICD-10-CM | POA: Diagnosis not present

## 2016-07-19 LAB — CULTURE, BLOOD (ROUTINE X 2)
Culture: NO GROWTH
Culture: NO GROWTH

## 2016-07-19 NOTE — Progress Notes (Signed)
Location:   [facility] Room Number: 104/D Place of Service:  SNF (31) Provider:  Beola Cord, NP  Patient Care Team: Marletta Lor, NP as PCP - General (Nurse Practitioner)  Extended Emergency Contact Information Primary Emergency Contact: Welborne,[name] Address: [address] [place], [place] [address] [name] Home Phone: [phone] Mobile Phone: [phone] Relation: Daughter Secondary Emergency Contact: Gibson Ramp Address: [address]          [place], [place] [address] [name] Home Phone: [phone] Mobile Phone: [phone] Relation: Relative  Code Status:  DNR Goals of care: Advanced Directive information Advanced Directives 07/19/2016  Does patient have an advance directive? Yes  Type of Advance Directive Out of facility DNR (pink MOST or yellow form)  Does patient want to make changes to advanced directive? No - Patient declined  Copy of advanced directive(s) in chart? Yes  Pre-existing out of facility DNR order (yellow form or pink MOST form) -     Chief Complaint  Patient presents with  . Acute Visit    HPI:  Pt is a 82 y.o. male seen today for an acute visit for  continous low grade fever and altered mental status. Patient has been in and out of hospital for last few weeks due to his episodes of syncope.Last time he was diagnosed with UTI and started on Cefuroxime. But he spiked fever in the facility and had been drowsy.  His CBC showed no elevation of WBC. But had slight left shift. His Chest Xray was negative. Had long d/w daughter yesterday and she does not want him to keep going to hospital and wants to continue care an the facility. At this time he has low grade fever which responds well to tylenol. He has been just sleeping and gets agitated if try to get him up.   Past Medical History:  Diagnosis Date  . Arthritis    "probably"  . Chronic kidney disease    "?  stage" (08/18/2014)  . Chronic kidney disease   . Dementia    "don't know stage or type" (08/18/2014)  . Elevated cholesterol   . Hypertension   . PAF (paroxysmal atrial fibrillation) (HCC) 07/15/2016  . Seizures (HCC)   . Stroke (HCC)   . Type II diabetes mellitus (HCC)    Past Surgical History:  Procedure Laterality Date  . CATARACT EXTRACTION    . CORONARY ARTERY BYPASS GRAFT  ?1994   "CABG X3"  . TONSILLECTOMY      No Known Allergies  Current Outpatient Prescriptions on File Prior to Visit  Medication Sig Dispense Refill  . acetaminophen (TYLENOL) 325 MG tablet Take 650 mg by mouth every 6 (six) hours as needed.    [name] amLODipine (NORVASC) 10 MG tablet Take 10 mg by mouth daily.    . cefUROXime (CEFTIN) 500 MG tablet Take 1 tablet (500 mg total) by mouth 2 (two) times daily with a meal. Antibiotic to be taken for 5 more days.    . Cholecalciferol (VITAMIN D-3) 1000 UNITS CAPS Take 1,000 Units by mouth daily.    . folic acid (FOLVITE) 1 MG tablet Take 1 mg by mouth daily.    . insulin glargine (LANTUS) 100 UNIT/ML injection Inject 0.18 mLs (18 Units total) into the skin at bedtime.    . levETIRAcetam (KEPPRA) 500 MG tablet Take 1 tablet (500 mg total) by mouth 2 (two) times  daily.    . levothyroxine (SYNTHROID, LEVOTHROID) 25 MCG tablet Take 2 tablets (50 mcg total) by mouth daily before breakfast. 60 tablet 1  . lisinopril (PRINIVIL,ZESTRIL) 40 MG tablet Take 40 mg by mouth daily.    . pantoprazole (PROTONIX) 40 MG tablet Take 1 tablet (40 mg total) by mouth daily.    . potassium chloride (KLOR-CON) 20 MEQ packet Take 20 mEq by mouth daily.      No current facility-administered medications on file prior to visit.      Review of Systems  Unable to perform ROS: Dementia    Immunization History  Administered Date(s) Administered  . Influenza-Unspecified 05/29/2014  . PPD Test 11/01/2013, 10/18/2014  . Pneumococcal-Unspecified 11/22/1998   Pertinent  Health Maintenance  Due  Topic Date Due  . INFLUENZA VACCINE  09/28/2016 (Originally 05/29/2016)  . PNA vac Low Risk Adult (2 of 2 - PCV13) 10/29/2016 (Originally 11/23/1999)  . OPHTHALMOLOGY EXAM  08/07/2016  . FOOT EXAM  10/13/2016  . HEMOGLOBIN A1C  11/30/2016  . URINE MICROALBUMIN  05/29/2017   No flowsheet data found. Functional Status Survey:    Vitals:   07/19/16 1323  BP: (!) 148/80  Pulse: 94  Resp: 19  Temp: 99.7 F (37.6 C)  TempSrc: Oral   There is no height or weight on file to calculate BMI. Physical Exam  Constitutional: He appears well-developed and well-nourished. He appears lethargic.  Cardiovascular: Normal rate, regular rhythm and normal heart sounds.   Pulmonary/Chest: Effort normal and breath sounds normal. No respiratory distress. He has no wheezes. He has no rales.  Abdominal: Soft. Bowel sounds are normal. He exhibits no distension. There is no tenderness. There is no rebound and no guarding.  Musculoskeletal: He exhibits no edema.  Neurological: He appears lethargic.    Labs reviewed:  Recent Labs  07/15/16 0639 07/16/16 0543 Aug 13, 2016 0703  NA 137 137 137  K 3.9 4.1 4.0  CL 106 105 106  CO2 24 24 25   GLUCOSE 205* 225* 163*  BUN 47* 39* 30*  CREATININE 1.49* 1.23 1.08  CALCIUM 8.2* 8.4* 8.5*    Recent Labs  07/01/16 1602 07/14/16 1901 07/15/16 0639  AST 25 22 15   ALT 5* 18 15*  ALKPHOS 46 123 120  BILITOT 0.7 0.5 0.6  PROT 6.3* 6.4* 5.8*  ALBUMIN 3.3* 2.9* 2.5*    Recent Labs  07/03/16 0710  07/09/16 0625  07/16/16 0543 08/13/16 0703 07/18/16 1300  WBC 11.3*  < > 9.3  < > 7.8 9.1 9.0  NEUTROABS 7.6  --  6.7  --   --   --  6.4  HGB 8.9*  < > 8.2*  < > 8.5* 9.3* 7.9*  HCT 25.9*  < > 24.8*  < > 25.6* 28.8* 24.8*  MCV 94.5  < > 98.0  < > 97.3 97.0 97.6  PLT 171  < > 342  < > 489* 511* 464*  < > = values in this interval not displayed. Lab Results  Component Value Date   TSH 2.376 07/15/2016   Lab Results  Component Value Date   HGBA1C  9.1 (H) 05/30/2016   Lab Results  Component Value Date   CHOL 189 12/23/2014   HDL 44 12/23/2014   LDLCALC 125 (H) 12/23/2014   TRIG 101 12/23/2014   CHOLHDL 4.3 12/23/2014    Significant Diagnostic Results in last 30 days:  Dg Chest 1 View  Result Date: 07/01/2016 CLINICAL DATA:  Fall EXAM: CHEST 1 VIEW  COMPARISON:  Portable exam 1603 hours compared to 03/16/2015 FINDINGS: Normal heart size, mediastinal contours, and pulmonary vascularity. Postsurgical changes of CABG. Atherosclerotic calcification aorta. Lungs clear. No pleural effusion or pneumothorax. Bones demineralized. Displaced oblique fracture of the proximal LEFT humerus again identified. IMPRESSION: No acute abnormalities. Post CABG. Aortic atherosclerosis. Electronically Signed   By: Ulyses Southward M.D.   On: 07/01/2016 16:34   Dg Pelvis 1-2 Views  Result Date: 07/01/2016 CLINICAL DATA:  Fall EXAM: PELVIS - 1-2 VIEW COMPARISON:  02/15/2016 FINDINGS: Diffuse osseous demineralization. BILATERAL hip joint space narrowing slightly greater on RIGHT. SI joints preserved. Degenerative disc disease changes at lower lumbar spine. No definite acute fracture, dislocation, or bone destruction. Scattered atherosclerotic calcifications. IMPRESSION: Osseous demineralization with mild degenerative changes of both hips and more advanced degenerative changes at the visualized lower lumbar spine. No acute osseous abnormalities. Electronically Signed   By: Ulyses Southward M.D.   On: 07/01/2016 16:33   Ct Head Wo Contrast  Result Date: 07/14/2016 CLINICAL DATA:  Acute onset of altered mental status. Aphasia. Initial encounter. EXAM: CT HEAD WITHOUT CONTRAST TECHNIQUE: Contiguous axial images were obtained from the base of the skull through the vertex without intravenous contrast. COMPARISON:  CT of the head performed 07/01/2016 FINDINGS: Brain: No evidence of acute infarction, hemorrhage, hydrocephalus, extra-axial collection or mass lesion/mass effect.  Prominence of the ventricles and sulci reflects moderate cortical volume loss. Cerebellar atrophy is noted. Mild periventricular white matter change likely reflects small vessel ischemic microangiopathy. The brainstem and fourth ventricle are within normal limits. The basal ganglia are unremarkable in appearance. The cerebral hemispheres demonstrate grossly normal gray-white differentiation. No mass effect or midline shift is seen. Vascular: No hyperdense vessel or unexpected calcification. Skull: There is no evidence of fracture; visualized osseous structures are unremarkable in appearance. Sinuses/Orbits: The visualized portions of the orbits are within normal limits. There is opacification of the left side of the sphenoid sinus and left ethmoid air cells. The remaining paranasal sinuses and mastoid air cells are well-aerated. Other: No significant soft tissue abnormalities are seen. IMPRESSION: 1. No acute intracranial pathology seen on CT. 2. Moderate cortical volume loss and scattered small vessel ischemic microangiopathy. 3. Opacification of the left side of the sphenoid sinus and left ethmoid air cells. Electronically Signed   By: Roanna Raider M.D.   On: 07/14/2016 20:33   Ct Head Wo Contrast  Result Date: 07/01/2016 CLINICAL DATA:  [facility] staff reports pt was discharged today from [facility] and when he arrived to [facility] they noticed he had swelling, bruising, and blisters to left arm. They did an xray and found that pt has a humerus fracture. EXAM: CT HEAD WITHOUT CONTRAST TECHNIQUE: Contiguous axial images were obtained from the base of the skull through the vertex without intravenous contrast. COMPARISON:  06/28/2016 FINDINGS: Brain: There is significant central and cortical atrophy. Periventricular white matter changes are consistent with small vessel disease. An extra-axial calcified mass is identified adjacent to the right frontal bone measuring 8 mm and stable in appearance compared with prior  study. There is no associated edema on, mass-effect, or hemorrhage. Vascular: Significant calcification of the internal carotid arteries. Skull: No calvarial fracture. Sinuses/Orbits: Significant opacification of the left sphenoid air cell and multiple bilateral ethmoid air cells. Mastoid air cells are normally aerated. Other: None IMPRESSION: 1. Atrophy and small vessel disease. 2.  No evidence for acute  abnormality. 3. Stable right frontal calcified mass measuring 8 mm. This may represent a small meningioma  or meningeal calcification and appears stable. Electronically Signed   By: Norva Pavlov M.D.   On: 07/01/2016 16:59   Mr Laqueta Jean ZO Contrast  Result Date: 06/29/2016 CLINICAL DATA:  Confusion.  Left-sided weakness and slurred speech EXAM: MRI HEAD WITHOUT AND WITH CONTRAST TECHNIQUE: Multiplanar, multiecho pulse sequences of the brain and surrounding structures were obtained without and with intravenous contrast. CONTRAST:  9mL MULTIHANCE GADOBENATE DIMEGLUMINE 529 MG/ML IV SOLN COMPARISON:  CT head 06/28/2016.  MRI 10/04/2014 FINDINGS: Negative for acute infarct. Moderate atrophy. Mild chronic white matter changes. Small chronic infarct right paramedian pons. Normal cerebellum. Mild amount of hemosiderin staining along the cortex of the right frontal parietal lobe likely due to chronic mild subarachnoid hemorrhage. No acute hemorrhage is seen on yesterday's CT. No fluid collection or mass-effect Negative for mass or edema. Postcontrast imaging demonstrates normal enhancement. Small dural base calcification high right frontal lobe does not enhance and may be dural calcification or small calcified meningioma. Postcontrast imaging is degraded by motion. Extensive mucosal edema in the paranasal sinuses with complete opacification of the left sphenoid sinus and extensive mucosal edema in the left ethmoid sinus. Mucosal edema in the maxillary sinuses left greater than right. This is chronic and unchanged  from 2015. No orbital lesion identified.  Pituitary not enlarged. IMPRESSION: Negative for acute infarct. Atrophy and mild chronic ischemic change Chronic sinusitis Electronically Signed   By: Marlan Palau M.D.   On: 06/29/2016 10:56   Dg Chest Portable 1 View  Result Date: 07/14/2016 CLINICAL DATA:  Altered mental status. EXAM: PORTABLE CHEST 1 VIEW COMPARISON:  07/01/2016 FINDINGS: Lordotic technique demonstrated. Sternotomy wires are unchanged. Lungs are adequately inflated without focal consolidation or effusion. Mild cardiomegaly. Old left humeral neck/ head fracture. IMPRESSION: No active disease. Mild cardiomegaly. Electronically Signed   By: Elberta Fortis M.D.   On: 07/14/2016 19:24   Dg Shoulder Left  Result Date: 07/11/2016 Clinical:  History of fracture of the left proximal humerus X-rays were done of the proximal left humerus, two views There is a comminuted proximal humeral fracture with some displacement.  The humeral head is located.  There is a butterfly fragment of the anterior proximal humerus also present and slightly displaced. Impression:  Comminuted proximal humeral fracture with some displacement and butterfly fragment also present. Electronically Signed Darreld Mclean, MD 9/13/20172:15 PM   Dg Shoulder Left  Result Date: 07/01/2016 CLINICAL DATA:  Fall EXAM: LEFT SHOULDER - 2+ VIEW COMPARISON:  None FINDINGS: Osseous demineralization. AC joint alignment normal. Oblique fracture at surgical neck LEFT humerus extending down the proximal diaphysis, mildly displaced. No definite dislocation. No additional fracture or bone destruction seen. Visualized LEFT ribs intact. IMPRESSION: Displaced oblique fracture at surgical neck LEFT humerus extending into the proximal diaphysis. Electronically Signed   By: Ulyses Southward M.D.   On: 07/01/2016 16:54   Dg Humerus Left  Result Date: 07/01/2016 CLINICAL DATA:  Swelling, bruising and blisters on LEFT arm, recent discharge from hospital, humeral  fracture EXAM: LEFT HUMERUS - 2+ VIEW COMPARISON:  None FINDINGS: Osseous demineralization. AC joint alignment normal. Elbow and glenohumeral joint alignments grossly normal Displaced oblique fracture of the surgical neck LEFT humerus extending a short way down the proximal shaft. No additional fracture or dislocation. Bony excrescence question osteochondroma at distal LEFT humeral metaphysis. IMPRESSION: Displaced oblique fracture of the proximal LEFT humerus. 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 Electronically Signed   By: Ulyses Southward M.D.   On: 07/01/2016 16:32  Ct Head Code Stroke Wo Contrast`  Addendum Date: 06/29/2016   ADDENDUM REPORT: 06/29/2016 00:50 ADDENDUM: These results were called by telephone at the time of interpretation on 06/28/2016 at 11:35 p.m. to Dr. Devoria AlbeIva Knapp , who verbally acknowledged these results. Electronically Signed   By: Deatra RobinsonKevin  Herman M.D.   On: 06/29/2016 00:50   Result Date: 06/29/2016 CLINICAL DATA:  Code stroke.  Sudden onset left-sided weakness EXAM: CT HEAD WITHOUT CONTRAST TECHNIQUE: Contiguous axial images were obtained from the base of the skull through the vertex without intravenous contrast. COMPARISON:  Head CT 10/02/2015 FINDINGS: Cortical gray-white differentiation is preserved. The basal ganglia and insular cortices are normal. No extra-axial collection, subarachnoid hemorrhage or intraparenchymal hematoma. No mass lesion, midline shift or hydrocephalus.B there is a small dural-based calcification along the right frontal convexity, possibly a small meningioma. Complete opacification of the left sphenoid sinus and posterior ethmoid air cells, unchanged. Mastoids are clear. Normal orbits. ASPECTS [facility]([place] Stroke Program Early CT Score) - Ganglionic level infarction (caudate, lentiform nuclei, internal capsule, insula, M1-M3 cortex): 7 - Supraganglionic infarction (M4-M6 cortex): 3 Total score (0-10 with 10 being normal): 10 IMPRESSION: 1. No acute  intracranial hemorrhage. 2. ASPECTS is 10.  No CT evidence of acute cortical infarct. 3. Small dural base calcification along the right frontal convexity, possibly a small meningioma. Electronically Signed: By: Deatra RobinsonKevin  Herman M.D. On: 06/28/2016 23:38    Assessment/Plan  Fever with mental status changes.  Will change his antibiotics to include broader spectrum Start on Levaquin 500 mg QD 7 days. Encourage fluid intake. Continue Comfort care. Patients family does not want him to go to the hospital.  Atrial fibrillation  Will hold his Eliquis as patients HGB has dropped to 7.9 Will restart once patient is more awake.  Prognosis stays guarded. Family/ staff Communication:   Labs/tests ordered:

## 2016-07-20 ENCOUNTER — Encounter: Payer: Self-pay | Admitting: Internal Medicine

## 2016-07-20 ENCOUNTER — Telehealth: Payer: Self-pay

## 2016-07-20 NOTE — Telephone Encounter (Signed)
[facility] home advised preventice pt fell , had fracture and is in hospital, and monitor is being returned

## 2016-07-23 ENCOUNTER — Telehealth: Payer: Self-pay | Admitting: Orthopaedic Surgery

## 2016-07-23 NOTE — Telephone Encounter (Signed)
Call received [facility] [place], requests to cancel appointment for follow up fracture care/Xray for 07/25/16; states patient not doing well at this time; said will call back when able to re-schedule.

## 2016-07-25 ENCOUNTER — Ambulatory Visit: Payer: Medicare Other | Admitting: Orthopaedic Surgery

## 2016-07-27 ENCOUNTER — Encounter (HOSPITAL_COMMUNITY)
Admission: RE | Admit: 2016-07-27 | Discharge: 2016-07-27 | Disposition: A | Discharge status: Home / Self Care | Payer: Medicare Other | Source: Skilled Nursing Facility | Attending: *Deleted | Admitting: *Deleted

## 2016-07-27 DIAGNOSIS — R569 Unspecified convulsions: Secondary | ICD-10-CM | POA: Diagnosis present

## 2016-07-27 DIAGNOSIS — R410 Disorientation, unspecified: Secondary | ICD-10-CM | POA: Diagnosis present

## 2016-07-27 DIAGNOSIS — N179 Acute kidney failure, unspecified: Secondary | ICD-10-CM | POA: Diagnosis present

## 2016-07-27 DIAGNOSIS — E119 Type 2 diabetes mellitus without complications: Secondary | ICD-10-CM | POA: Diagnosis present

## 2016-07-27 DIAGNOSIS — G40309 Generalized idiopathic epilepsy and epileptic syndromes, not intractable, without status epilepticus: Secondary | ICD-10-CM | POA: Diagnosis present

## 2016-07-27 DIAGNOSIS — I639 Cerebral infarction, unspecified: Secondary | ICD-10-CM | POA: Diagnosis present

## 2016-07-27 LAB — CBC
HEMATOCRIT: 23.6 % — AB (ref 39.0–52.0)
Hemoglobin: 7.8 g/dL — ABNORMAL LOW (ref 13.0–17.0)
MCH: 31.2 pg (ref 26.0–34.0)
MCHC: 33.1 g/dL (ref 30.0–36.0)
MCV: 94.4 fL (ref 78.0–100.0)
Platelets: 394 10*3/uL (ref 150–400)
RBC: 2.5 MIL/uL — AB (ref 4.22–5.81)
RDW: 14.1 % (ref 11.5–15.5)
WBC: 5.3 10*3/uL (ref 4.0–10.5)

## 2016-07-27 LAB — BASIC METABOLIC PANEL
ANION GAP: 4 — AB (ref 5–15)
BUN: 32 mg/dL — AB (ref 6–20)
CHLORIDE: 105 mmol/L (ref 101–111)
CO2: 26 mmol/L (ref 22–32)
Calcium: 8.6 mg/dL — ABNORMAL LOW (ref 8.9–10.3)
Creatinine, Ser: 1.16 mg/dL (ref 0.61–1.24)
GFR calc Af Amer: >60 mL/min (ref >60)
GFR calc non Af Amer: 57 mL/min — ABNORMAL LOW (ref >60)
GLUCOSE: 223 mg/dL — AB (ref 65–99)
POTASSIUM: 4.1 mmol/L (ref 3.5–5.1)
Sodium: 135 mmol/L (ref 135–145)

## 2016-07-29 DEATH — deceased

## 2016-08-01 ENCOUNTER — Non-Acute Institutional Stay (SKILLED_NURSING_FACILITY): Payer: Medicare Other | Admitting: Internal Medicine

## 2016-08-01 ENCOUNTER — Encounter: Payer: Self-pay | Admitting: Internal Medicine

## 2016-08-01 DIAGNOSIS — D631 Anemia in chronic kidney disease: Secondary | ICD-10-CM

## 2016-08-01 DIAGNOSIS — I480 Paroxysmal atrial fibrillation: Secondary | ICD-10-CM | POA: Diagnosis not present

## 2016-08-01 DIAGNOSIS — R55 Syncope and collapse: Secondary | ICD-10-CM

## 2016-08-01 DIAGNOSIS — N189 Chronic kidney disease, unspecified: Secondary | ICD-10-CM

## 2016-08-01 DIAGNOSIS — R569 Unspecified convulsions: Secondary | ICD-10-CM | POA: Diagnosis not present

## 2016-08-01 DIAGNOSIS — E1165 Type 2 diabetes mellitus with hyperglycemia: Secondary | ICD-10-CM

## 2016-08-01 DIAGNOSIS — E118 Type 2 diabetes mellitus with unspecified complications: Secondary | ICD-10-CM

## 2016-08-01 NOTE — Progress Notes (Signed)
Location:   [facility] Room Number: 104/D Place of Service:  SNF [phone]) Provider:  Keene Breath, NP  Patient Care Team: Marletta Lor, NP as PCP - General (Nurse Practitioner)  Extended Emergency Contact Information Primary Emergency Contact: Welborne,[name] Address: [address] [place], [place] [address] [name] Home Phone: [phone] Mobile Phone: [phone] Relation: Daughter Secondary Emergency Contact: Gibson Ramp Address: [address]          [place], [place] [address] [name] Home Phone: [phone] Mobile Phone: [phone] Relation: Relative  Code Status:  DNR Goals of care: Advanced Directive information Advanced Directives 08/01/2016  Does patient have an advance directive? Yes  Type of Advance Directive Out of facility DNR (pink MOST or yellow form)  Does patient want to make changes to advanced directive? No - Patient declined  Copy of advanced directive(s) in chart? Yes  Pre-existing out of facility DNR order (yellow form or pink MOST form) -     Chief Complaint  Patient presents with  . Acute Visit     Fall   Also follow-up of syncope-atrial fibrillation-anemia-diabetes type 2-renal insufficiency-recent left humerus fracture  HPI:  Pt is a 82 y.o. male seen today for an acute visit for fall earlier this morning-the history behind this fall is unclear-he is not complaining of any pain apparently did not hit his head he is resting in bed comfortably-systolic elevation of blood pressure was in the 180s after fall but this has come down to 150 suspect the elevation was due to excimer from the fall.  He does have a history of previous fall and sustained a left humerus fracture that is followed by orthopedic/orthopedic appointment however was canceled secondary patient was not doing well. With his other issues at that time  He does have a history of recurrent low-grade  temperature--chest x-ray was negative-  Dr Chales Abrahams did discuss patient's status with her daughter recently-patient had been lethargic again with the consistent low-grade temperature-family expressed desires for more comfort care no further hospitalization-.  He was started on Levaquin and appears fevers have subsided he is completing the course of Levaquin today-he appears to be more at his baseline today he is alert and responsive.  Patient has been in and outhospital recently because of episodes of syncope-again he was treated for UTI as well.  Despite that treatment on a cephalosporin again he did have low-grade temperatures but this again appears to have improved with addition of the Levaquin which is he is completing  Again family does not desire any further hospitalizations.  Patient did recently have a Holter monitor which showed suspected atrial fibrillation-this has been assessed by cardiology and he was started on  Eliquist--however this is currently on hold secondary to anemia issues with a hemoglobin of 7.8 on most recent lab-.  He does also have a history of renal insufficiency however this appears stable with creatinine of 1.16 BUN of 32 on lab done on 07/27/2016.  Currently he is resting in bed comfortably again does not complain of any pain vital signs appear to be stable he has been afebrile.  Of note he is also a type II diabetic and is on Lantus 22 units daily. He is on sliding scale insulin starting with CBG of 200 or greater-blood sugar this morning was 133 at noon was 246 I do see recently he's been running frequent 2-300s this will  have to be monitored  He has had periods of hypoglycemia in the past  Past Medical History:  Diagnosis Date  . Arthritis    "probably"  . Chronic kidney disease    "? stage" (08/18/2014)  . Chronic kidney disease   . Dementia    "don't know stage or type" (08/18/2014)  . Elevated cholesterol   . Hypertension   . PAF (paroxysmal atrial  fibrillation) (HCC) 07/15/2016  . Seizures (HCC)   . Stroke (HCC)   . Type II diabetes mellitus (HCC)    Past Surgical History:  Procedure Laterality Date  . CATARACT EXTRACTION    . CORONARY ARTERY BYPASS GRAFT  ?1994   "CABG X3"  . TONSILLECTOMY      No Known Allergies  Current Outpatient Prescriptions on File Prior to Visit  Medication Sig Dispense Refill  . acetaminophen (TYLENOL) 325 MG tablet Take 650 mg by mouth every 6 (six) hours as needed.    [name] amLODipine (NORVASC) 10 MG tablet Take 10 mg by mouth daily.    . Cholecalciferol (VITAMIN D-3) 1000 UNITS CAPS Take 1,000 Units by mouth daily.    . folic acid (FOLVITE) 1 MG tablet Take 1 mg by mouth daily.    . insulin glargine (LANTUS) 100 UNIT/ML injection Inject 0.18 mLs (18 Units total) into the skin at bedtime.    . insulin lispro (HUMALOG KWIKPEN) 100 UNIT/ML KiwkPen Give per sliding scale four times a day    . levothyroxine (SYNTHROID, LEVOTHROID) 25 MCG tablet Take 2 tablets (50 mcg total) by mouth daily before breakfast. 60 tablet 1  . lisinopril (PRINIVIL,ZESTRIL) 40 MG tablet Take 40 mg by mouth daily.    . pantoprazole (PROTONIX) 40 MG tablet Take 1 tablet (40 mg total) by mouth daily.    . potassium chloride (KLOR-CON) 20 MEQ packet Take 20 mEq by mouth daily.      No current facility-administered medications on file prior to visit.   Of note patient I see is on Lantus 22 units daily  Review of Systems   Essentially unattainable secondary to dementia-obtained from staff and limited from patient but he is not complaining of any pain in his joints or had-he apparently is eating and drinking better-appears to be more at his baseline which is encouraging  Immunization History  Administered Date(s) Administered  . Influenza-Unspecified 05/29/2014  . PPD Test 11/01/2013, 10/18/2014  . Pneumococcal-Unspecified 11/22/1998   Pertinent  Health Maintenance Due  Topic Date Due  . INFLUENZA VACCINE  09/28/2016 (Originally  05/29/2016)  . PNA vac Low Risk Adult (2 of 2 - PCV13) 10/29/2016 (Originally 11/23/1999)  . OPHTHALMOLOGY EXAM  08/07/2016  . FOOT EXAM  10/13/2016  . HEMOGLOBIN A1C  11/30/2016  . URINE MICROALBUMIN  05/29/2017   No flowsheet data found. Functional Status Survey:    Vitals:   08/01/16 1017  BP: (!) 187/85  Pulse: 77  Resp: 20  Temp: 98.4 F (36.9 C)  TempSrc: Oral  SpO2: 96%  Updated vital signs temperature 97.4-pulse 82-respirations 18-blood pressure 150/82 There is no height or weight on file to calculate BMI. Physical Exam  Constitutional: He appears well-developed and well-nourished. He is alert and responsive pleasant smiling.  His skin is warm and dry do not note any open areas or increased bruising.  Head ears eyes nose mouth and throat was appear reactive to light sclera and conjunctiva are clear visual acuity appears grossly intact.  Oropharynx is clear tongue midline mucous membranes are moist  Cardiovascular: Normal rate, regular rhythm and normal heart sounds.   Pulmonary/Chest: Effort normal and breath sounds normal. No respiratory distress. He has no wheezes. He has no rales.  Abdominal: Soft. Bowel sounds are normal. He exhibits no distension. There is no tenderness. There is no rebound and no guarding.  Musculoskeletal: He exhibits minimal edema-he is able to move all extremities 4 has some stiffness of his lower extremities but I do not note any deformities there is no pain with palpation of his hip he does not appear to have discomfort with flexion and extension at the hip-he continues to have edema of his left arm this is cool to touch non-erythematous has limited range of motion of his left arm which is baseline says he has some discomfort when he tries to raise his arm but I don't believe this is anything new  Neurological: He is alert responsive I do not note any lateralizing findings --speech is clear but limited    Labs reviewed:  Recent Labs   07/16/16 0543 2016/05/02 0703 07/27/16 0705  NA 137 137 135  K 4.1 4.0 4.1  CL 105 106 105  CO2 24 25 26   GLUCOSE 225* 163* 223*  BUN 39* 30* 32*  CREATININE 1.23 1.08 1.16  CALCIUM 8.4* 8.5* 8.6*    Recent Labs  07/01/16 1602 07/14/16 1901 07/15/16 0639  AST 25 22 15   ALT 5* 18 15*  ALKPHOS 46 123 120  BILITOT 0.7 0.5 0.6  PROT 6.3* 6.4* 5.8*  ALBUMIN 3.3* 2.9* 2.5*    Recent Labs  07/03/16 0710  07/09/16 0625  2016/05/02 0703 07/18/16 1300 07/27/16 0705  WBC 11.3*  < > 9.3  < > 9.1 9.0 5.3  NEUTROABS 7.6  --  6.7  --   --  6.4  --   HGB 8.9*  < > 8.2*  < > 9.3* 7.9* 7.8*  HCT 25.9*  < > 24.8*  < > 28.8* 24.8* 23.6*  MCV 94.5  < > 98.0  < > 97.0 97.6 94.4  PLT 171  < > 342  < > 511* 464* 394  < > = values in this interval not displayed. Lab Results  Component Value Date   TSH 2.376 07/15/2016   Lab Results  Component Value Date   HGBA1C 9.1 (H) 05/30/2016   Lab Results  Component Value Date   CHOL 189 12/23/2014   HDL 44 12/23/2014   LDLCALC 125 (H) 12/23/2014   TRIG 101 12/23/2014   CHOLHDL 4.3 12/23/2014    Significant Diagnostic Results in last 30 days:  Ct Head Wo Contrast  Result Date: 07/14/2016 CLINICAL DATA:  Acute onset of altered mental status. Aphasia. Initial encounter. EXAM: CT HEAD WITHOUT CONTRAST TECHNIQUE: Contiguous axial images were obtained from the base of the skull through the vertex without intravenous contrast. COMPARISON:  CT of the head performed 07/01/2016 FINDINGS: Brain: No evidence of acute infarction, hemorrhage, hydrocephalus, extra-axial collection or mass lesion/mass effect. Prominence of the ventricles and sulci reflects moderate cortical volume loss. Cerebellar atrophy is noted. Mild periventricular white matter change likely reflects small vessel ischemic microangiopathy. The brainstem and fourth ventricle are within normal limits. The basal ganglia are unremarkable in appearance. The cerebral hemispheres demonstrate  grossly normal gray-white differentiation. No mass effect or midline shift is seen. Vascular: No hyperdense vessel or unexpected calcification. Skull: There is no evidence of fracture; visualized osseous structures are unremarkable in appearance. Sinuses/Orbits: The visualized portions of the orbits are within normal limits.  There is opacification of the left side of the sphenoid sinus and left ethmoid air cells. The remaining paranasal sinuses and mastoid air cells are well-aerated. Other: No significant soft tissue abnormalities are seen. IMPRESSION: 1. No acute intracranial pathology seen on CT. 2. Moderate cortical volume loss and scattered small vessel ischemic microangiopathy. 3. Opacification of the left side of the sphenoid sinus and left ethmoid air cells. Electronically Signed   By: Roanna Raider M.D.   On: 07/14/2016 20:33   Dg Chest Portable 1 View  Result Date: 07/14/2016 CLINICAL DATA:  Altered mental status. EXAM: PORTABLE CHEST 1 VIEW COMPARISON:  07/01/2016 FINDINGS: Lordotic technique demonstrated. Sternotomy wires are unchanged. Lungs are adequately inflated without focal consolidation or effusion. Mild cardiomegaly. Old left humeral neck/ head fracture. IMPRESSION: No active disease. Mild cardiomegaly. Electronically Signed   By: Elberta Fortis M.D.   On: 07/14/2016 19:24   Dg Shoulder Left  Result Date: 07/11/2016 Clinical:  History of fracture of the left proximal humerus X-rays were done of the proximal left humerus, two views There is a comminuted proximal humeral fracture with some displacement.  The humeral head is located.  There is a butterfly fragment of the anterior proximal humerus also present and slightly displaced. Impression:  Comminuted proximal humeral fracture with some displacement and butterfly fragment also present. Electronically Signed Darreld Mclean, MD 9/13/20172:15 PM    Assessment/Plan #1 fall with no apparent injury-patient appears to be at his baseline  actually more alert and responsive recently-continues to have some edema and pain of his left arm although I don't believe this is new-Will x-ray the area however and monitor.  #2 history of atrial fibrillation again he has been seen by cardiology and started on  Eliquist  which is currently on hold secondary to a hemoglobin of 7.8-will update a CBC tomorrow before considering even restarting.  Rate appears to be controlled.  #3 history of anemia-as noted above hemoglobin recently 7.8 we will update this he does have a history of chronic anemia.--He is on a proton pump inhibitor  #4-history of low-grade temperature-this appears to be resolving he is completing  course of Levaquin at this point will monitor he does not appear to be septic he is doing better clinically which is encouraging.  #5-history renal insufficiency Will update a metabolic panel this has shown stability as noted above with the creatinine 1.16 most recently.  #6 history of diabetes type 2-he is on Lantus and Humalog sliding scale insulin but appears his blood sugars are fairly consistently in the 2 300 range with occasional readings in the 100s although this is fairly infrequent recently blood sugar was 133 this morning would like to wait to tomorrow see what his morning sugar is if it is elevated suspect we will increase his Lantus again would like to avoid hypoglycemia.  #7 history of syncope-again he has had recurrent episodes with hospitalizations with no clear etiology-possibly some cardiac etiology although per cardiology evaluation this is unclear He also has been started and Keppra for possibility seizures contributing to this -nonetheless at this point he appears to be having a. If stability he is more alert afebrile eating better at this point will monitor . #8-hypertension-he continues on lisinopril 40 mg a day as well as Norvasc 10 mg a day--has somewhat elevated systolic elevations at times-at this point will  monitor before making medication adjustments-I see frequent systolics in the 140s-previously I see systolics in the 120s-with patient's numerous recent acute episodes including the fever  syncope and lethargy-would be as in it to add more medications at this point at this point will monitor   [id number]-of note greater than 40 minutes spent assessing patient-discussing his status with nursing staff-reviewing his chart-his labs-and coordinating and formulating a plan of care-of note greater than 50% of time spent coordinating plan of care          London Sheer, [place] [phone]

## 2016-08-02 ENCOUNTER — Other Ambulatory Visit (HOSPITAL_COMMUNITY)
Admission: AD | Admit: 2016-08-02 | Discharge: 2016-08-02 | Disposition: A | Discharge status: Home / Self Care | Payer: Medicare Other | Source: Skilled Nursing Facility | Attending: Internal Medicine | Admitting: Internal Medicine

## 2016-08-02 DIAGNOSIS — I639 Cerebral infarction, unspecified: Secondary | ICD-10-CM | POA: Diagnosis present

## 2016-08-02 DIAGNOSIS — R569 Unspecified convulsions: Secondary | ICD-10-CM | POA: Insufficient documentation

## 2016-08-02 DIAGNOSIS — E119 Type 2 diabetes mellitus without complications: Secondary | ICD-10-CM | POA: Diagnosis present

## 2016-08-02 LAB — CBC WITH DIFFERENTIAL/PLATELET
Basophils Absolute: 0 10*3/uL (ref 0.0–0.1)
Basophils Relative: 1 %
Eosinophils Absolute: 0.2 10*3/uL (ref 0.0–0.7)
Eosinophils Relative: 4 %
HCT: 30.2 % — ABNORMAL LOW (ref 39.0–52.0)
Hemoglobin: 9.7 g/dL — ABNORMAL LOW (ref 13.0–17.0)
Lymphocytes Relative: 28 %
Lymphs Abs: 1.6 10*3/uL (ref 0.7–4.0)
MCH: 30 pg (ref 26.0–34.0)
MCHC: 32.1 g/dL (ref 30.0–36.0)
MCV: 93.5 fL (ref 78.0–100.0)
Monocytes Absolute: 0.5 10*3/uL (ref 0.1–1.0)
Monocytes Relative: 9 %
Neutro Abs: 3.2 10*3/uL (ref 1.7–7.7)
Neutrophils Relative %: 58 %
Platelets: 262 10*3/uL (ref 150–400)
RBC: 3.23 MIL/uL — ABNORMAL LOW (ref 4.22–5.81)
RDW: 14.5 % (ref 11.5–15.5)
WBC: 5.5 10*3/uL (ref 4.0–10.5)

## 2016-08-02 LAB — BASIC METABOLIC PANEL WITH GFR
Anion gap: 5 (ref 5–15)
BUN: 27 mg/dL — ABNORMAL HIGH (ref 6–20)
CO2: 26 mmol/L (ref 22–32)
Calcium: 8.6 mg/dL — ABNORMAL LOW (ref 8.9–10.3)
Chloride: 106 mmol/L (ref 101–111)
Creatinine, Ser: 0.98 mg/dL (ref 0.61–1.24)
GFR calc Af Amer: >60 mL/min
GFR calc non Af Amer: >60 mL/min
Glucose, Bld: 155 mg/dL — ABNORMAL HIGH (ref 65–99)
Potassium: 3.9 mmol/L (ref 3.5–5.1)
Sodium: 137 mmol/L (ref 135–145)

## 2016-08-07 LAB — HM DIABETES EYE EXAM

## 2016-08-13 ENCOUNTER — Encounter (HOSPITAL_COMMUNITY)
Admission: RE | Admit: 2016-08-13 | Discharge: 2016-08-13 | Disposition: A | Discharge status: Home / Self Care | Payer: Medicare Other | Source: Skilled Nursing Facility | Attending: Internal Medicine | Admitting: Internal Medicine

## 2016-08-13 DIAGNOSIS — E119 Type 2 diabetes mellitus without complications: Secondary | ICD-10-CM | POA: Insufficient documentation

## 2016-08-13 DIAGNOSIS — N183 Chronic kidney disease, stage 3 (moderate): Secondary | ICD-10-CM | POA: Diagnosis not present

## 2016-08-13 DIAGNOSIS — I10 Essential (primary) hypertension: Secondary | ICD-10-CM | POA: Diagnosis not present

## 2016-08-13 DIAGNOSIS — N390 Urinary tract infection, site not specified: Secondary | ICD-10-CM | POA: Diagnosis present

## 2016-08-13 LAB — TSH: TSH: 3.27 u[IU]/mL (ref 0.350–4.500)

## 2016-08-17 ENCOUNTER — Encounter: Payer: Self-pay | Admitting: Internal Medicine

## 2016-08-17 ENCOUNTER — Non-Acute Institutional Stay (SKILLED_NURSING_FACILITY): Payer: Medicare Other | Admitting: Internal Medicine

## 2016-08-17 DIAGNOSIS — G309 Alzheimer's disease, unspecified: Secondary | ICD-10-CM | POA: Diagnosis not present

## 2016-08-17 DIAGNOSIS — R29810 Facial weakness: Secondary | ICD-10-CM | POA: Diagnosis not present

## 2016-08-17 DIAGNOSIS — E1165 Type 2 diabetes mellitus with hyperglycemia: Secondary | ICD-10-CM | POA: Diagnosis not present

## 2016-08-17 DIAGNOSIS — R4182 Altered mental status, unspecified: Secondary | ICD-10-CM | POA: Diagnosis not present

## 2016-08-17 DIAGNOSIS — E118 Type 2 diabetes mellitus with unspecified complications: Secondary | ICD-10-CM

## 2016-08-17 DIAGNOSIS — F0280 Dementia in other diseases classified elsewhere without behavioral disturbance: Secondary | ICD-10-CM

## 2016-08-17 NOTE — Progress Notes (Signed)
Location:   [facility] Room Number: 104/D Place of Service:  SNF [phone]) Provider:  Keene BreathArlo Meiko Ives  Barr, Julie, NP  Patient Care Team: Marletta LorJulie Barr, NP as PCP - General (Nurse Practitioner)  Extended Emergency Contact Information Primary Emergency Contact: Welborne,[name] Address: [address]          [place] [id number] [place] Home Phone: [phone] Mobile Phone: [phone] Relation: Daughter Secondary Emergency Contact: Gibson RampWelborne,Gloria Address: [address]          [place], [place] [address] [place] Home Phone: [phone] Mobile Phone: [phone] Relation: Relative  Code Status:  Full Code Goals of care: Advanced Directive information Advanced Directives 08/17/2016  Does patient have an advance directive? Yes  Type of Advance Directive Out of facility DNR (pink MOST or yellow form)  Does patient want to make changes to advanced directive? No - Patient declined  Copy of advanced directive(s) in chart? Yes  Pre-existing out of facility DNR order (yellow form or pink MOST form) -     Chief Complaint  Patient presents with  . Acute Visit    Possible Stroke    HPI:  Pt is a 82 y.o. male seen today for an acute visit forConcerns patient may have had a mild CVA.  Patient has had a complicated course recently does have a history of recurrent syncope-there is some question whether this is cardiac related or seizure related.  The syncopal episodes were somewhat infrequent but now he is having more frequent episodes.  He has had numerous trips to the ER but family at this point desires essentially comfort no more hospitalization and any aggressive measures done essentially in the name of comfort.  Apparently this morning was thought to have a right-sided mouth droop and mental status was changed somewhat he was not quite as responsive.  He is now somewhat more responsive although he's had increased  weakness here recently which appears to be progressing-complicated with a history of dementia.  Vital signs are stable blood pressure when this was first noted with somewhat elevated with a systolic of 177 this is now 140/70 usually comes down as his blood pressure medications.  He recently had a left humerus fracture as well continues to have his arm in a sling.  Of note is also a type II diabetic he is on Lantus 22 units daily at bedtime as well as Humalog insulin 4 times a day-blood sugar this morning was in the 50s nursing staff was able to get this up into normal range.  Per review of recent CBGs in the morning they appear to run somewhat low at times from the 60s to the 90s frequently the last few days.  Later in the day however blood sugars are somewhat elevated more in the higher 100s to 200s range.  Currently he is resting in bed comfortably he is responding to simple verbal commands he does not appear to be in any distress.     Past Medical History:  Diagnosis Date  . Arthritis    "probably"  . Chronic kidney disease    "? stage" (08/18/2014)  . Chronic kidney disease   . Dementia    "don't know stage or type" (08/18/2014)  . Elevated cholesterol   . Hypertension   . PAF (paroxysmal atrial fibrillation) (HCC) 07/15/2016  . Seizures (HCC)   . Stroke (HCC)   . Type II diabetes mellitus (HCC)    Past Surgical History:  Procedure Laterality Date  . CATARACT EXTRACTION    . CORONARY ARTERY BYPASS GRAFT  ?1994   "CABG X3"  . TONSILLECTOMY      No Known Allergies  Current Outpatient Prescriptions on File Prior to Visit  Medication Sig Dispense Refill  . acetaminophen (TYLENOL) 325 MG tablet Take 650 mg by mouth every 6 (six) hours as needed.    [name] amLODipine (NORVASC) 10 MG tablet Take 10 mg by mouth daily.    . Cholecalciferol (VITAMIN D-3) 1000 UNITS CAPS Take 1,000 Units by mouth daily.    . folic acid (FOLVITE) 1 MG tablet Take 1 mg by mouth daily.    . insulin  glargine (LANTUS) 100 UNIT/ML injection Inject 22 Units into the skin at bedtime.    . insulin lispro (HUMALOG KWIKPEN) 100 UNIT/ML KiwkPen Give per sliding scale four times a day    . levETIRAcetam (KEPPRA) 100 MG/ML solution Give 5 ml by mouth twice a day    . levothyroxine (SYNTHROID, LEVOTHROID) 25 MCG tablet Take 2 tablets (50 mcg total) by mouth daily before breakfast. 60 tablet 1  . lisinopril (PRINIVIL,ZESTRIL) 40 MG tablet Take 40 mg by mouth daily.    . pantoprazole (PROTONIX) 40 MG tablet Take 1 tablet (40 mg total) by mouth daily.    . potassium chloride (KLOR-CON) 20 MEQ packet Take 20 mEq by mouth daily.      No current facility-administered medications on file prior to visit.      Review of Systems   Largely unobtainable secondary to dementia--please see HPI---no complaints of SOB, chest pain, Headache, Dizziness or significant pain..  Immunization History  Administered Date(s) Administered  . Influenza-Unspecified 05/29/2014, 07/31/2016  . PPD Test 11/01/2013, 10/18/2014  . Pneumococcal-Unspecified 11/22/1998, 08/07/2016   Pertinent  Health Maintenance Due  Topic Date Due  . FOOT EXAM  10/13/2016  . HEMOGLOBIN A1C  11/30/2016  . URINE MICROALBUMIN  05/29/2017  . OPHTHALMOLOGY EXAM  08/07/2017  . PNA vac Low Risk Adult (2 of 2 - PCV13) 08/07/2017  . INFLUENZA VACCINE  Completed   No flowsheet data found. Functional Status Survey:    Vitals:   08/17/16 1039  BP: (!) 177/68  Pulse: 89  Resp: 19  Temp: 97.4 F (36.3 C)  TempSrc: Axillary  of note update BP is 140/70 There is no height or weight on file to calculate BMI. Physical Exam  In general this is a pleasant elderly male appears to be resting comfortably in bed he is responsive making eye contact appears to have a slight right mouth droop.  His skin is warm and dry.  Eyes pupils appear reactive to light sclera and conjunctivae are clear visual acuity appears grossly intact-extraocular movements  intact.  Chest is clear to auscultation there is no labored breathing.  Heart is regular rate and rhythm with occasional irregular beat has baseline mild lower extremity edema bilaterally.  His abdomen soft nontender with positive bowel sounds.  Skeletal skeletal does have his left arm in a sling with very limited range of motion here which is baseline he does have some edema of the arm which is not new radial pulses intact.  Has reduced grip strength on the left versus the right but this is not new he does have grip strength on the right appears able to move his right upper extremity at baseline-has lower extremity weakness but appears able to move them again he is not following verbal commands to move this significantly but this is not  a new.  Neurologic as noted above cranial nerves do appear to be intact tongue has full range of motion and is midline.  Speech is somewhat slurred but this does not appear to be a Coley new finding again he appears to have a mild right-sided droop but speaking with nursing staff support staff this may not be totally new either and intermittently appears.  Psych-se is oriented to self is following simple verbal commands he is pleasant no signs of agitation.    Labs reviewed:  Recent Labs  07/12/2016 0703 07/27/16 0705 08/02/16 0700  NA 137 135 137  K 4.0 4.1 3.9  CL 106 105 106  CO2 25 26 26   GLUCOSE 163* 223* 155*  BUN 30* 32* 27*  CREATININE 1.08 1.16 0.98  CALCIUM 8.5* 8.6* 8.6*    Recent Labs  07/01/16 1602 07/14/16 1901 07/15/16 0639  AST 25 22 15   ALT 5* 18 15*  ALKPHOS 46 123 120  BILITOT 0.7 0.5 0.6  PROT 6.3* 6.4* 5.8*  ALBUMIN 3.3* 2.9* 2.5*    Recent Labs  07/09/16 0625  07/18/16 1300 07/27/16 0705 08/02/16 0700  WBC 9.3  < > 9.0 5.3 5.5  NEUTROABS 6.7  --  6.4  --  3.2  HGB 8.2*  < > 7.9* 7.8* 9.7*  HCT 24.8*  < > 24.8* 23.6* 30.2*  MCV 98.0  < > 97.6 94.4 93.5  PLT 342  < > 464* 394 262  < > = values in this  interval not displayed. Lab Results  Component Value Date   TSH 3.270 08/13/2016   Lab Results  Component Value Date   HGBA1C 9.1 (H) 05/30/2016   Lab Results  Component Value Date   CHOL 189 12/23/2014   HDL 44 12/23/2014   LDLCALC 125 (H) 12/23/2014   TRIG 101 12/23/2014   CHOLHDL 4.3 12/23/2014    Significant Diagnostic Results in last 30 days:  No results found.  Assessment/Plan   Question TIA-versus CVA-mental status changes  -at this for difficult to fully tell if there is any change here-- talking with certain staff members the mouth droop is not totally new and intermittently has occurred-one would suspect possibly there may be some progression of his dementia as well which is contibuting to this  I do note he also has a history of possible seizures and is on Keppra   I do note at one point he had been on  Eliquist--with a possible history of arrhythmia but this was discontinued secondary to a hemoglobin in the high sevens.  His hemoglobin most recently was 9.7 on lab done 08/02/2016.  I did discuss his status with his daughter via phone-again she reiterated desires for essentially trying to keep her father comfortable with a minimum of invasive procedures and no hospitalization.  At this point will monitor since he appears to be comfortable.  His low blood sugars in the morning are concerning will discontinue his Lantus and continued the Humalog 4 times a day to keep a close eye on this since he is now eating fairly poorly which---- per discussion with the speech therapist today is thought to be most likely related to his progressive dementia  In regards to hypertension he is on Norvasc as well as lisinopril he has variable blood pressure readings at this point will monitor again updated blood pressure is 140/70.  I note in regards to hemoglobin this has risen since he was taken off the  Eliquist--at some point consider possibly  low-dose aspirin although again there  would be concern with possible decrease in his hemoglobin again.  This will warrant reevaluation in the near future depending on patient's clinical status.  Again clinically he appears to be stable at this point but will have to be monitored and I have discussed this with nursing staff .  [id number] note greater than 50 minutes spent assessing patient-reassessing patient during the day-reviewing his chart-reviewing his labs-discussing his status with nursing staff-and support staff-as well as with his daughter via phone-and coordinating and formulating a plan of care for numerous diagnoses-of note greater than 50% of time spent coordinating plan of care      London Sheer, [place] [phone]

## 2016-08-24 ENCOUNTER — Ambulatory Visit: Payer: Medicare Other | Admitting: Adult Health

## 2016-08-29 ENCOUNTER — Encounter: Payer: Self-pay | Admitting: Internal Medicine

## 2016-08-29 ENCOUNTER — Non-Acute Institutional Stay (SKILLED_NURSING_FACILITY): Payer: Medicare Other | Admitting: Internal Medicine

## 2016-08-29 ENCOUNTER — Inpatient Hospital Stay (INDEPENDENT_AMBULATORY_CARE_PROVIDER_SITE_OTHER): Payer: Medicare Other

## 2016-08-29 ENCOUNTER — Ambulatory Visit (INDEPENDENT_AMBULATORY_CARE_PROVIDER_SITE_OTHER): Payer: Medicare Other | Admitting: Orthopaedic Surgery

## 2016-08-29 DIAGNOSIS — S42212S Unspecified displaced fracture of surgical neck of left humerus, sequela: Secondary | ICD-10-CM

## 2016-08-29 DIAGNOSIS — E118 Type 2 diabetes mellitus with unspecified complications: Secondary | ICD-10-CM | POA: Diagnosis not present

## 2016-08-29 DIAGNOSIS — S42295D Other nondisplaced fracture of upper end of left humerus, subsequent encounter for fracture with routine healing: Secondary | ICD-10-CM

## 2016-08-29 DIAGNOSIS — R55 Syncope and collapse: Secondary | ICD-10-CM | POA: Diagnosis not present

## 2016-08-29 DIAGNOSIS — I10 Essential (primary) hypertension: Secondary | ICD-10-CM

## 2016-08-29 DIAGNOSIS — F0150 Vascular dementia without behavioral disturbance: Secondary | ICD-10-CM | POA: Diagnosis not present

## 2016-08-29 DIAGNOSIS — E1165 Type 2 diabetes mellitus with hyperglycemia: Secondary | ICD-10-CM | POA: Diagnosis not present

## 2016-08-29 NOTE — Progress Notes (Signed)
CC:  Fracture of the left shoulder  He is resident at [facility].  He is in sling.  He has no new trauma. X-rays were done, reported separately.  NV intact.  He does not talk.  Sling OK.  Encounter Diagnosis  Name Primary?  . Other closed nondisplaced fracture of proximal end of left humerus with routine healing, subsequent encounter Yes   He can start coming out of sling.  Return in one month.  X-rays on return.  Call if any problem.  Electronically Signed Darreld McleanWayne Saliyah Gillin, MD 11/1/20172:19 PM

## 2016-08-29 NOTE — Progress Notes (Addendum)
Location:   [facility] Room Number: 104/D Place of Service:  SNF (31) Provider:  Beola CordAnjali,Ajai Terhaar  Barr, Julie, NP  Patient Care Team: Marletta LorJulie Barr, NP as PCP - General (Nurse Practitioner)  Extended Emergency Contact Information Primary Emergency Contact: Welborne,[name] Address: [address]          [place], [place] [id number] [place] Home Phone: [phone] Mobile Phone: [phone] Relation: Daughter Secondary Emergency Contact: Gibson RampWelborne,Gloria Address: [address]          [place], [place] [id number] [place] Home Phone: [phone] Mobile Phone: [phone] Relation: Relative  Code Status:  DNR Goals of care: Advanced Directive information Advanced Directives 08/29/2016  Does patient have an advance directive? Yes  Type of Advance Directive Out of facility DNR (pink MOST or yellow form)  Does patient want to make changes to advanced directive? No - Patient declined  Copy of advanced directive(s) in chart? Yes  Pre-existing out of facility DNR order (yellow form or pink MOST form) -     Chief Complaint  Patient presents with  . Medical Management of Chronic Issues    Routine Visit    HPI:  Pt is a 82 y.o. male seen today for medical management of chronic diseases.  Patient has h/o Paroxysmal Atrial fibrillation, Diabetes Type 2 , Chronic Renal insufficieny, CVA and recurrent syncope and fall. Patient has episodes in which he becomes unresponsive and has drooling. Has been evaluated many times in the past thought to be ? Seizures he is on Keppra,  During his hospital stay patient also sustained fall with Left humerus fracture and UTI . After D/W daughter patient was made Comfort care with no transfer to hospitals with these episodes. According to nurses Patient is doing better for last few weeks. His appetite has picked up. He is more awake and responsive today. His Blood sugars ar now running high also more then  200 most of the time. His Lantus was stopped before due to Low BS and low appetite. He is only on sliding scale. Patient weight has been down from 196 lbs in 09/04 to 186.8 Lbs.on 10/19   Past Medical History:  Diagnosis Date  . Arthritis    "probably"  . Chronic kidney disease    "? stage" (08/18/2014)  . Chronic kidney disease   . Dementia    "don't know stage or type" (08/18/2014)  . Elevated cholesterol   . Hypertension   . PAF (paroxysmal atrial fibrillation) (HCC) 07/15/2016  . Seizures (HCC)   . Stroke (HCC)   . Type II diabetes mellitus (HCC)    Past Surgical History:  Procedure Laterality Date  . CATARACT EXTRACTION    . CORONARY ARTERY BYPASS GRAFT  ?1994   "CABG X3"  . TONSILLECTOMY      No Known Allergies  Current Outpatient Prescriptions on File Prior to Visit  Medication Sig Dispense Refill  . acetaminophen (TYLENOL) 325 MG tablet Take 650 mg by mouth every 6 (six) hours as needed.    [name]. amLODipine (NORVASC) 10 MG tablet Take 10 mg by mouth daily.    Lucilla Lame. Balsam [place]-Castor Oil (VENELEX) OINT Apply to sacrum and bilateral buttocks q shift for prevention every shift    . Cholecalciferol (VITAMIN D-3) 1000 UNITS CAPS Take 1,000 Units by mouth daily.    . folic acid (FOLVITE) 1 MG tablet Take 1 mg by mouth daily.    . insulin lispro (HUMALOG KWIKPEN) 100 UNIT/ML  KiwkPen Give per sliding scale four times a day    . levETIRAcetam (KEPPRA) 100 MG/ML solution Give 5 ml by mouth twice a day    . levothyroxine (SYNTHROID, LEVOTHROID) 25 MCG tablet Take 2 tablets (50 mcg total) by mouth daily before breakfast. 60 tablet 1  . lisinopril (PRINIVIL,ZESTRIL) 40 MG tablet Take 40 mg by mouth daily.    . pantoprazole (PROTONIX) 40 MG tablet Take 1 tablet (40 mg total) by mouth daily.    . potassium chloride (KLOR-CON) 20 MEQ packet Take 20 mEq by mouth daily.      No current facility-administered medications on file prior to visit.      Review of Systems  Immunization  History  Administered Date(s) Administered  . Influenza-Unspecified 05/29/2014, 07/31/2016  . PPD Test 11/01/2013, 10/18/2014  . Pneumococcal-Unspecified 11/22/1998, 08/07/2016   Pertinent  Health Maintenance Due  Topic Date Due  . FOOT EXAM  10/13/2016  . HEMOGLOBIN A1C  11/30/2016  . URINE MICROALBUMIN  05/29/2017  . OPHTHALMOLOGY EXAM  08/07/2017  . PNA vac Low Risk Adult (2 of 2 - PCV13) 08/07/2017  . INFLUENZA VACCINE  Completed   No flowsheet data found. Functional Status Survey:    Vitals:   08/29/16 0918  BP: (!) 163/77 repeat 150/80  Pulse: 86  Resp: 19  Temp: 97.8 F (36.6 C)  TempSrc: Axillary  SpO2: 96%   There is no height or weight on file to calculate BMI. Physical Exam  Constitutional: He appears well-developed and well-nourished.  HENT:  Head: Normocephalic.  Mouth/Throat: Oropharynx is clear and moist.  Cardiovascular: Normal rate and normal heart sounds.  An irregular rhythm present.  No murmur heard. Pulmonary/Chest: Effort normal and breath sounds normal. No respiratory distress. He has no wheezes. He has no rales.  Abdominal: Soft. Bowel sounds are normal. He exhibits no distension. There is no tenderness. There is no rebound.  Musculoskeletal: He exhibits no edema.  Neurological: He is alert.  Patient seems very alert today was able to tell me his name. Also followed some simple commands.    Labs reviewed:  Recent Labs  04-Dec-2015 0703 07/27/16 0705 08/02/16 0700  NA 137 135 137  K 4.0 4.1 3.9  CL 106 105 106  CO2 25 26 26   GLUCOSE 163* 223* 155*  BUN 30* 32* 27*  CREATININE 1.08 1.16 0.98  CALCIUM 8.5* 8.6* 8.6*    Recent Labs  07/01/16 1602 07/14/16 1901 07/15/16 0639  AST 25 22 15   ALT 5* 18 15*  ALKPHOS 46 123 120  BILITOT 0.7 0.5 0.6  PROT 6.3* 6.4* 5.8*  ALBUMIN 3.3* 2.9* 2.5*    Recent Labs  07/09/16 0625  07/18/16 1300 07/27/16 0705 08/02/16 0700  WBC 9.3  < > 9.0 5.3 5.5  NEUTROABS 6.7  --  6.4  --  3.2    HGB 8.2*  < > 7.9* 7.8* 9.7*  HCT 24.8*  < > 24.8* 23.6* 30.2*  MCV 98.0  < > 97.6 94.4 93.5  PLT 342  < > 464* 394 262  < > = values in this interval not displayed. Lab Results  Component Value Date   TSH 3.270 08/13/2016   Lab Results  Component Value Date   HGBA1C 9.1 (H) 05/30/2016   Lab Results  Component Value Date   CHOL 189 12/23/2014   HDL 44 12/23/2014   LDLCALC 125 (H) 12/23/2014   TRIG 101 12/23/2014   CHOLHDL 4.3 12/23/2014    Significant Diagnostic Results  in last 30 days:  No results found.  Assessment/Plan  Syncope  As noted many times it has been difficult to know the cause of these episodes. Last episode was on 08/21/16. But as per request of daughters no further investigation warranted at this time. Will continue Keppra for presumed diagnosis of Seizures.  Uncontrolled type 2 diabetes mellitus   BS are running high again since patients appetite is improving . His Lantus  was stopped as he had few episodes of hypoglycemia and was not eating well. Patient is on sliding scale now. His Last A1C was 9.1in 08/17 Will restart Lantus at lower dose of 10 units. Continue Accu check. Will recheck A1C next month.  Closed displaced fracture of surgical neck of left humerus Patient still in sling with some swelling of his arm. Patient never had follow up with ortho due to his worsening status. But D/W the Ortho today they can see him and repeat Xray to see if he can come off the sling.   Dementia with Loss of weight.  Continue comfort care and dietary supplement.  Anemia HGB now has improved to 9.7 Will start him on iron supplement. Continue to follow as needed. Hypertension He does have high systolic. But his H/O Syncope's will be little conservative in overcorrecting his Bp. Will continue same meds for now.  Hypothyroidism Last TSH in 10/17 was normal.   Paroxysmal Atrial fibrillation Patient was diagnosed with Holter and was placed on Eliquis bit his Hgb  dropped to 7.5 Eliquis was stopped. Will continue to monitor. If patient does improve will be candidate for Aspirin.  Family/ staff Communication:   Labs/tests ordered:

## 2016-09-04 ENCOUNTER — Encounter: Payer: Self-pay | Admitting: Internal Medicine

## 2016-09-04 ENCOUNTER — Non-Acute Institutional Stay (SKILLED_NURSING_FACILITY): Payer: Medicare Other | Admitting: Internal Medicine

## 2016-09-04 DIAGNOSIS — R55 Syncope and collapse: Secondary | ICD-10-CM

## 2016-09-04 DIAGNOSIS — E1165 Type 2 diabetes mellitus with hyperglycemia: Secondary | ICD-10-CM | POA: Diagnosis not present

## 2016-09-04 DIAGNOSIS — E118 Type 2 diabetes mellitus with unspecified complications: Secondary | ICD-10-CM

## 2016-09-04 DIAGNOSIS — F0150 Vascular dementia without behavioral disturbance: Secondary | ICD-10-CM | POA: Diagnosis not present

## 2016-09-04 NOTE — Progress Notes (Signed)
Location:   [facility] Room Number: 104/D Place of Service:  SNF (31) Provider:  Beola CordAnjali,Gupta  Barr, Julie, NP  Patient Care Team: Marletta LorJulie Barr, NP as PCP - General (Nurse Practitioner)  Extended Emergency Contact Information Primary Emergency Contact: Welborne,[name] Address: [address]          [place], [place] [id number] [place] Home Phone: [phone] Mobile Phone: [phone] Relation: Daughter Secondary Emergency Contact: Gibson RampWelborne,Gloria Address: [address]          [place], [place] [address] [place] Home Phone: [phone] Mobile Phone: [phone] Relation: Relative  Code Status:  DNR Goals of care: Advanced Directive information Advanced Directives 09/04/2016  Does patient have an advance directive? Yes  Type of Advance Directive Out of facility DNR (pink MOST or yellow form)  Does patient want to make changes to advanced directive? No - Patient declined  Copy of advanced directive(s) in chart? Yes  Pre-existing out of facility DNR order (yellow form or pink MOST form) -     Chief Complaint  Patient presents with  . Acute Visit    Syncope    HPI:  Pt is a 82 y.o. male seen today for an acute visit for another episode of unresponsiveness.Patient has history of PAF, Diabetes type 2, CRI, CVA and recurrent syncope ? Seizures. Patient was in his Dorene SorrowJerry chair when he again became unresponsive. This is his first episode in few weeks. Patient was put in the bed . His vitals were stable and his BS was 155. He was not responding just staring. He would respond to his name but not following any commands. There was no seizure activity was noticed.  Past Medical History:  Diagnosis Date  . Arthritis    "probably"  . Chronic kidney disease    "? stage" (08/18/2014)  . Chronic kidney disease   . Dementia    "don't know stage or type" (08/18/2014)  . Elevated cholesterol   . Hypertension   . PAF  (paroxysmal atrial fibrillation) (HCC) 07/15/2016  . Seizures (HCC)   . Stroke (HCC)   . Type II diabetes mellitus (HCC)    Past Surgical History:  Procedure Laterality Date  . CATARACT EXTRACTION    . CORONARY ARTERY BYPASS GRAFT  ?1994   "CABG X3"  . TONSILLECTOMY      No Known Allergies  Current Outpatient Prescriptions on File Prior to Visit  Medication Sig Dispense Refill  . acetaminophen (TYLENOL) 325 MG tablet Take 650 mg by mouth every 6 (six) hours as needed.    [name]. amLODipine (NORVASC) 10 MG tablet Take 10 mg by mouth daily.    Lucilla Lame. Balsam [place]-Castor Oil (VENELEX) OINT Apply to sacrum and bilateral buttocks q shift for prevention every shift    . Cholecalciferol (VITAMIN D-3) 1000 UNITS CAPS Take 1,000 Units by mouth daily.    . folic acid (FOLVITE) 1 MG tablet Take 1 mg by mouth daily.    . insulin lispro (HUMALOG KWIKPEN) 100 UNIT/ML KiwkPen Give per sliding scale four times a day    . levETIRAcetam (KEPPRA) 100 MG/ML solution Give 5 ml by mouth twice a day    . levothyroxine (SYNTHROID, LEVOTHROID) 25 MCG tablet Take 2 tablets (50 mcg total) by mouth daily before breakfast. 60 tablet 1  . lisinopril (PRINIVIL,ZESTRIL) 40 MG tablet Take 40 mg by mouth daily.    . pantoprazole (PROTONIX) 40 MG tablet Take 1 tablet (40 mg total) by  mouth daily.     No current facility-administered medications on file prior to visit.     Review of Systems  Unable to perform ROS: Dementia    Immunization History  Administered Date(s) Administered  . Influenza-Unspecified 05/29/2014, 07/31/2016  . PPD Test 11/01/2013, 10/18/2014  . Pneumococcal-Unspecified 11/22/1998, 08/07/2016   Pertinent  Health Maintenance Due  Topic Date Due  . FOOT EXAM  10/13/2016  . HEMOGLOBIN A1C  11/30/2016  . URINE MICROALBUMIN  05/29/2017  . OPHTHALMOLOGY EXAM  08/07/2017  . PNA vac Low Risk Adult (2 of 2 - PCV13) 08/07/2017  . INFLUENZA VACCINE  Completed   No flowsheet data found. Functional Status  Survey:    Vitals:   09/04/16 1447  BP: (!) 157/84  Pulse: 81  Resp: 20  Temp: 97.2 F (36.2 C)  TempSrc: Oral  SpO2: 91%   There is no height or weight on file to calculate BMI. Physical Exam  Constitutional: He appears well-developed and well-nourished.  HENT:  Head: Normocephalic.  Cardiovascular: Normal rate and normal heart sounds.  An irregular rhythm present.  Pulmonary/Chest: Effort normal and breath sounds normal. No respiratory distress. He has no wheezes. He has no rales.  Abdominal: Bowel sounds are normal. He exhibits no distension. There is no tenderness. There is no rebound.  Musculoskeletal: He exhibits no edema.  Neurological:  Patient would respond to his name but does not follow any commands.    Labs reviewed:  Recent Labs  07/24/2016 0703 07/27/16 0705 08/02/16 0700  NA 137 135 137  K 4.0 4.1 3.9  CL 106 105 106  CO2 25 26 26   GLUCOSE 163* 223* 155*  BUN 30* 32* 27*  CREATININE 1.08 1.16 0.98  CALCIUM 8.5* 8.6* 8.6*    Recent Labs  07/01/16 1602 07/14/16 1901 07/15/16 0639  AST 25 22 15   ALT 5* 18 15*  ALKPHOS 46 123 120  BILITOT 0.7 0.5 0.6  PROT 6.3* 6.4* 5.8*  ALBUMIN 3.3* 2.9* 2.5*    Recent Labs  07/09/16 0625  07/18/16 1300 07/27/16 0705 08/02/16 0700  WBC 9.3  < > 9.0 5.3 5.5  NEUTROABS 6.7  --  6.4  --  3.2  HGB 8.2*  < > 7.9* 7.8* 9.7*  HCT 24.8*  < > 24.8* 23.6* 30.2*  MCV 98.0  < > 97.6 94.4 93.5  PLT 342  < > 464* 394 262  < > = values in this interval not displayed. Lab Results  Component Value Date   TSH 3.270 08/13/2016   Lab Results  Component Value Date   HGBA1C 9.1 (H) 05/30/2016   Lab Results  Component Value Date   CHOL 189 12/23/2014   HDL 44 12/23/2014   LDLCALC 125 (H) 12/23/2014   TRIG 101 12/23/2014   CHOLHDL 4.3 12/23/2014    Significant Diagnostic Results in last 30 days:  Dg Shoulder Right  Result Date: 08/29/2016 Clinical:  History of fracture of the left shoulder X-rays were done  of the left shoulder, three views There is a comminuted fracture of the left proximal humerus with anterior positioning of humeral shaft.  Callus is present.  The humeral head is within the glenoid area.  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 Impression:  Comminuted fracture of the left proximal humerus with anterior positioning of the humeral shaft. Electronically Signed Darreld Mclean, MD 11/1/20172:17 PM    Assessment/Plan  Episode of unresponsiveness  As per wishes of his daughter we would continue making him  comfortable and not do any aggressive w/u for his unresponsiveness. Will still try to make him sit sometimes just under supervision. Rechecked patient He was more responsive now and responding to his name. Still wants to be left alone and sleeping.  Family/ staff Communication:   Labs/tests ordered:

## 2016-09-05 ENCOUNTER — Ambulatory Visit: Payer: Medicare Other | Admitting: Orthopaedic Surgery

## 2016-09-12 ENCOUNTER — Encounter (HOSPITAL_COMMUNITY)
Admission: RE | Admit: 2016-09-12 | Discharge: 2016-09-12 | Disposition: A | Discharge status: Home / Self Care | Payer: Medicare Other | Source: Skilled Nursing Facility | Attending: Internal Medicine | Admitting: Internal Medicine

## 2016-09-12 ENCOUNTER — Encounter: Payer: Self-pay | Admitting: Internal Medicine

## 2016-09-12 ENCOUNTER — Non-Acute Institutional Stay (SKILLED_NURSING_FACILITY): Payer: Medicare Other | Admitting: Internal Medicine

## 2016-09-12 DIAGNOSIS — N183 Chronic kidney disease, stage 3 unspecified: Secondary | ICD-10-CM

## 2016-09-12 DIAGNOSIS — E119 Type 2 diabetes mellitus without complications: Secondary | ICD-10-CM | POA: Diagnosis present

## 2016-09-12 DIAGNOSIS — M810 Age-related osteoporosis without current pathological fracture: Secondary | ICD-10-CM | POA: Diagnosis present

## 2016-09-12 DIAGNOSIS — I480 Paroxysmal atrial fibrillation: Secondary | ICD-10-CM

## 2016-09-12 DIAGNOSIS — I639 Cerebral infarction, unspecified: Secondary | ICD-10-CM | POA: Insufficient documentation

## 2016-09-12 DIAGNOSIS — R569 Unspecified convulsions: Secondary | ICD-10-CM | POA: Insufficient documentation

## 2016-09-12 DIAGNOSIS — E876 Hypokalemia: Secondary | ICD-10-CM

## 2016-09-12 DIAGNOSIS — R410 Disorientation, unspecified: Secondary | ICD-10-CM | POA: Diagnosis present

## 2016-09-12 DIAGNOSIS — I10 Essential (primary) hypertension: Secondary | ICD-10-CM

## 2016-09-12 DIAGNOSIS — D631 Anemia in chronic kidney disease: Secondary | ICD-10-CM | POA: Diagnosis not present

## 2016-09-12 DIAGNOSIS — N189 Chronic kidney disease, unspecified: Secondary | ICD-10-CM

## 2016-09-12 LAB — BASIC METABOLIC PANEL
ANION GAP: 7 (ref 5–15)
BUN: 12 mg/dL (ref 6–20)
CO2: 32 mmol/L (ref 22–32)
Calcium: 8.9 mg/dL (ref 8.9–10.3)
Chloride: 100 mmol/L — ABNORMAL LOW (ref 101–111)
Creatinine, Ser: 0.75 mg/dL (ref 0.61–1.24)
GFR calc Af Amer: >60 mL/min (ref >60)
Glucose, Bld: 108 mg/dL — ABNORMAL HIGH (ref 65–99)
POTASSIUM: 2.5 mmol/L — AB (ref 3.5–5.1)
SODIUM: 139 mmol/L (ref 135–145)

## 2016-09-12 LAB — CBC WITH DIFFERENTIAL/PLATELET
BASOS ABS: 0 10*3/uL (ref 0.0–0.1)
BASOS PCT: 0 %
EOS ABS: 0.1 10*3/uL (ref 0.0–0.7)
EOS PCT: 2 %
HCT: 27.7 % — ABNORMAL LOW (ref 39.0–52.0)
Hemoglobin: 9 g/dL — ABNORMAL LOW (ref 13.0–17.0)
Lymphocytes Relative: 24 %
Lymphs Abs: 1.6 10*3/uL (ref 0.7–4.0)
MCH: 27.5 pg (ref 26.0–34.0)
MCHC: 32.5 g/dL (ref 30.0–36.0)
MCV: 84.7 fL (ref 78.0–100.0)
Monocytes Absolute: 0.5 10*3/uL (ref 0.1–1.0)
Monocytes Relative: 7 %
Neutro Abs: 4.5 10*3/uL (ref 1.7–7.7)
Neutrophils Relative %: 67 %
PLATELETS: 417 10*3/uL — AB (ref 150–400)
RBC: 3.27 MIL/uL — AB (ref 4.22–5.81)
RDW: 15.1 % (ref 11.5–15.5)
WBC: 6.8 10*3/uL (ref 4.0–10.5)

## 2016-09-12 NOTE — Progress Notes (Signed)
Location:   [facility] Room Number: 104/D Place of Service:  SNF [phone](31) Provider:  Keene BreathArlo Chalise Pe  Barr, Julie, NP  Patient Care Team: Marletta LorJulie Barr, NP as PCP - General (Nurse Practitioner)  Extended Emergency Contact Information Primary Emergency Contact: Welborne,[name] Address: [address]          [place], [place] [id number] [place] Home Phone: [phone] Mobile Phone: [phone] Relation: Daughter Secondary Emergency Contact: Gibson RampWelborne,Gloria Address: [address]          [place], [place] [address] [place] Home Phone: [phone] Mobile Phone: [phone] Relation: Relative  Code Status:  DNR Goals of care: Advanced Directive information Advanced Directives 09/12/2016  Does patient have an advance directive? Yes  Type of Advance Directive Out of facility DNR (pink MOST or yellow form)  Does patient want to make changes to advanced directive? No - Patient declined  Copy of advanced directive(s) in chart? Yes  Pre-existing out of facility DNR order (yellow form or pink MOST form) -     Chief Complaint  Patient presents with  . Acute Visit    Low Potassium (Hypokalemia)    HPI:  Pt is a 82 y.o. male seen today for an acute visit for Low potassium (2.5 done on routine lab today.  Patient has severe dementia and cannot give any review of systems.  At one point he had been on low-dose potassium but this had been discontinued previously.  Previous potassium on 08/02/2016 was within normal range at 3.9 on September 29 was 4.1 He is not on a diuretic-he is on lisinopril which actually may increase his potassium  Vital signs are stable-he has she was seen last week for recurrent syncopal episode oriented short period of unresponsiveness-he usually responds once he is put in bed and apparently this was the case last week as well.  Family does not want any aggressive interventions no hospitalizations they  essentially want him to be comfortable and what can be treated in the facility be treated     Past Medical History:  Diagnosis Date  . Arthritis    "probably"  . Chronic kidney disease    "? stage" (08/18/2014)  . Chronic kidney disease   . Dementia    "don't know stage or type" (08/18/2014)  . Elevated cholesterol   . Hypertension   . PAF (paroxysmal atrial fibrillation) (HCC) 07/15/2016  . Seizures (HCC)   . Stroke (HCC)   . Type II diabetes mellitus (HCC)    Past Surgical History:  Procedure Laterality Date  . CATARACT EXTRACTION    . CORONARY ARTERY BYPASS GRAFT  ?1994   "CABG X3"  . TONSILLECTOMY      No Known Allergies  Current Outpatient Prescriptions on File Prior to Visit  Medication Sig Dispense Refill  . acetaminophen (TYLENOL) 325 MG tablet Take 650 mg by mouth every 6 (six) hours as needed.    [name]. amLODipine (NORVASC) 10 MG tablet Take 10 mg by mouth daily.    Lucilla Lame. Balsam [place]-Castor Oil (VENELEX) OINT Apply to sacrum and bilateral buttocks q shift for prevention every shift    . Cholecalciferol (VITAMIN D-3) 1000 UNITS CAPS Take 1,000 Units by mouth daily.    . ferrous sulfate (KP FERROUS SULFATE) 325 (65 FE) MG tablet Take 325 mg by mouth daily with breakfast.    . folic acid (FOLVITE) 1 MG tablet Take 1 mg by mouth daily.    . insulin glargine (  LANTUS) 100 unit/mL SOPN Inject 10 Units into the skin at bedtime.    . insulin lispro (HUMALOG KWIKPEN) 100 UNIT/ML KiwkPen Give per sliding scale four times a day    . levETIRAcetam (KEPPRA) 100 MG/ML solution Give 5 ml by mouth twice a day    . levothyroxine (SYNTHROID, LEVOTHROID) 25 MCG tablet Take 2 tablets (50 mcg total) by mouth daily before breakfast. 60 tablet 1  . lisinopril (PRINIVIL,ZESTRIL) 40 MG tablet Take 40 mg by mouth daily.    . pantoprazole (PROTONIX) 40 MG tablet Take 1 tablet (40 mg total) by mouth daily.     No current facility-administered medications on file prior to visit.      Review of  Systems   Unattainable secondary to dementia please see history of present illness-nursing staff has not noted any sign of distress here he is lying in bed comfortably is not complaining of pain-chest discomfort-or increased weakness he does have baseline weakness and has been gradually declining    Immunization History  Administered Date(s) Administered  . Influenza-Unspecified 05/29/2014, 07/31/2016  . PPD Test 11/01/2013, 10/18/2014  . Pneumococcal-Unspecified 11/22/1998, 08/07/2016   Pertinent  Health Maintenance Due  Topic Date Due  . FOOT EXAM  10/13/2016  . HEMOGLOBIN A1C  11/30/2016  . URINE MICROALBUMIN  05/29/2017  . OPHTHALMOLOGY EXAM  08/07/2017  . PNA vac Low Risk Adult (2 of 2 - PCV13) 08/07/2017  . INFLUENZA VACCINE  Completed   No flowsheet data found. Functional Status Survey:    Vitals:   09/12/16 1416  BP: (!) 142/70  Pulse: 72  Resp: 20  Temp: 98.4 F (36.9 C)  TempSrc: Oral  SpO2: 94%    Physical Exam   In general this is a pleasant elderly male appears to be resting comfortably in bed he is responsive making eye contact appears to be at baseline  His skin is warm and dry.  Eyes pupils appear reactive to light sclera and conjunctivae are clear visual acuity appears grossly intact-extraocular movements intact.  Chest is clear to auscultation there is no labored breathing.  Heart is regular rate and rhythm with occasional irregular beat has baseline mild lower extremity edema bilaterally.  His abdomen soft nontender with positive bowel sounds.  Muscle skeletal does move all extremities 4 with baseline weakness still has some edema of his left arm with a history of humerus fracture but this appears to be slowly improving t.     Neurologic as noted above cranial nerves do appear to be intact .  Speech is somewhat slurred but this does not appear to  New and has a slight right mouth droop which also is not new Psych-se is oriented  to self is following simple verbal commands he is pleasant no signs of agitation he speaks but fairly minimally smiles and makes eye contact.   Labs reviewed:  Recent Labs  07/27/16 0705 08/02/16 0700 09/12/16 0500  NA 135 137 139  K 4.1 3.9 2.5*  CL 105 106 100*  CO2 26 26 32  GLUCOSE 223* 155* 108*  BUN 32* 27* 12  CREATININE 1.16 0.98 0.75  CALCIUM 8.6* 8.6* 8.9    Recent Labs  07/01/16 1602 07/14/16 1901 07/15/16 0639  AST 25 22 15   ALT 5* 18 15*  ALKPHOS 46 123 120  BILITOT 0.7 0.5 0.6  PROT 6.3* 6.4* 5.8*  ALBUMIN 3.3* 2.9* 2.5*    Recent Labs  07/18/16 1300 07/27/16 0705 08/02/16 0700 09/12/16 0500  WBC 9.0  5.3 5.5 6.8  NEUTROABS 6.4  --  3.2 4.5  HGB 7.9* 7.8* 9.7* 9.0*  HCT 24.8* 23.6* 30.2* 27.7*  MCV 97.6 94.4 93.5 84.7  PLT 464* 394 262 417*   Lab Results  Component Value Date   TSH 3.270 08/13/2016   Lab Results  Component Value Date   HGBA1C 9.1 (H) 05/30/2016   Lab Results  Component Value Date   CHOL 189 12/23/2014   HDL 44 12/23/2014   LDLCALC 125 (H) 12/23/2014   TRIG 101 12/23/2014   CHOLHDL 4.3 12/23/2014    Significant Diagnostic Results in last 30 days:  Dg Shoulder Right  Result Date: 08/29/2016 Clinical:  History of fracture of the left shoulder X-rays were done of the left shoulder, three views There is a comminuted fracture of the left proximal humerus with anterior positioning of humeral shaft.  Callus is present.  The humeral head is within the glenoid area.  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 Impression:  Comminuted fracture of the left proximal humerus with anterior positioning of the humeral shaft. Electronically Signed Darreld Mclean, MD 11/1/20172:17 PM    Assessment/Plan  #1-hypokalemia this is quite significant-family again wants essentially comfort care will treat this aggressively with potassium 40 mEq 3 times a day and get a stat BMP as well as magnesium level tomorrow morning.  Also monitor  vital signs every shift 24 hours.  Clinically he appears to be at his baseline no sign of distress this was essentially an incidental finding on a routine lab.  I did reassess him later in the day and I status wasn't changed continued to have stable cardiac exam-no sign of distress he is behaving at baseline  Again this will have to be aggressively supplemented and I suspect he will need some routine potassium.  He has some slight irregularity in his heart rhythm but this is not new he does have a history of atrial fibrillation At one point had been on Eliquis but this was discontinued secondary to hemoglobin falling down to 7.8.  Hemoglobin on update lab is 9 which is down slightly from previous hemoglobin which was 9.7.  We'll continue to monitor at periodic intervals He is on iron. Is also on a proton pump inhibitor   He does have a history chronic kidney disease however lab today was baseline except for his potassium of 2.5 sodium was 139 BUN 12 and creatinine was 0.75  In regards to hypertension at times he does have elevated systolics but have been conservative here with patient's history of syncope--at this point continue lisinopril 40 mg a day as well as Norvasc 10 mg a day     [id number]-of note greater than 35 minutes spent assessing patient-and reassessing patient later today to ensure stability-discussing his status with nursing staff-reviewing his chart-his labs-and coordinating and formulating a plan of care-of note greater than 50% of time spent quite a plan of care with input as noted above       London Sheer, [place] [phone]

## 2016-09-13 ENCOUNTER — Encounter: Payer: Self-pay | Admitting: Internal Medicine

## 2016-09-13 ENCOUNTER — Encounter (HOSPITAL_COMMUNITY)
Admission: RE | Admit: 2016-09-13 | Discharge: 2016-09-13 | Disposition: A | Discharge status: Home / Self Care | Payer: Medicare Other | Source: Skilled Nursing Facility | Attending: Internal Medicine | Admitting: Internal Medicine

## 2016-09-13 ENCOUNTER — Non-Acute Institutional Stay (SKILLED_NURSING_FACILITY): Payer: Medicare Other | Admitting: Internal Medicine

## 2016-09-13 DIAGNOSIS — E119 Type 2 diabetes mellitus without complications: Secondary | ICD-10-CM | POA: Diagnosis not present

## 2016-09-13 DIAGNOSIS — E876 Hypokalemia: Secondary | ICD-10-CM

## 2016-09-13 LAB — BASIC METABOLIC PANEL
ANION GAP: 9 (ref 5–15)
BUN: 13 mg/dL (ref 6–20)
CO2: 29 mmol/L (ref 22–32)
Calcium: 8.9 mg/dL (ref 8.9–10.3)
Chloride: 103 mmol/L (ref 101–111)
Creatinine, Ser: 0.73 mg/dL (ref 0.61–1.24)
GFR calc Af Amer: >60 mL/min (ref >60)
GFR calc non Af Amer: >60 mL/min (ref >60)
GLUCOSE: 171 mg/dL — AB (ref 65–99)
POTASSIUM: 3.8 mmol/L (ref 3.5–5.1)
SODIUM: 141 mmol/L (ref 135–145)

## 2016-09-13 LAB — MAGNESIUM: Magnesium: 1.7 mg/dL (ref 1.7–2.4)

## 2016-09-13 LAB — HEMOGLOBIN A1C
Hgb A1c MFr Bld: 7.5 % — ABNORMAL HIGH (ref 4.8–5.6)
Mean Plasma Glucose: 169 mg/dL

## 2016-09-13 NOTE — Progress Notes (Signed)
Location:   [facility] Room Number: 104/D Place of Service:  SNF [phone]) Provider:  Keene Breath, NP  Patient Care Team: Marletta Lor, NP as PCP - General (Nurse Practitioner)  Extended Emergency Contact Information Primary Emergency Contact: Welborne,[name] Address: [address] [place], [place] [address] [name] Home Phone: [phone] Mobile Phone: [phone] Relation: Daughter Secondary Emergency Contact: Gibson Ramp Address: [address]          [place], [place] [address] [name] Home Phone: [phone] Mobile Phone: [phone] Relation: Relative  Code Status: DNR Goals of care: Advanced Directive information Advanced Directives 09/13/2016  Does patient have an advance directive? Yes  Type of Advance Directive Out of facility DNR (pink MOST or yellow form)  Does patient want to make changes to advanced directive? No - Patient declined  Copy of advanced directive(s) in chart? Yes  Pre-existing out of facility DNR order (yellow form or pink MOST form) -     Chief Complaint  Patient presents with  . Acute Visit    F/U   For hypokalemia   HPI:  Pt is a 82 y.o. male seen today for an acute visit for follow-up of hypokalemia-routine lab yesterday showed potassium of 2.5-at one point he had been on low-dose potassium but this was discontinued since he is not on a diuretic and he is already on lisinopril.  This was aggressively supplemented yesterday with 40 mEq potassium 3 times-potassium this morning has normalized at 3.8.  Magnesium level is within normal range at 1.7  Renal function remained stable with a BUN of 13 creatinine of 0.73.  He continues to rest in bed comfortably vital signs have been stable nursing staff has not noted any issues overnight   Past Medical History:  Diagnosis Date  . Arthritis    "probably"  . Chronic kidney disease    "? stage" (08/18/2014)    . Chronic kidney disease   . Dementia    "don't know stage or type" (08/18/2014)  . Elevated cholesterol   . Hypertension   . PAF (paroxysmal atrial fibrillation) (HCC) 07/15/2016  . Seizures (HCC)   . Stroke (HCC)   . Type II diabetes mellitus (HCC)    Past Surgical History:  Procedure Laterality Date  . CATARACT EXTRACTION    . CORONARY ARTERY BYPASS GRAFT  ?1994   "CABG X3"  . TONSILLECTOMY      No Known Allergies  Current Outpatient Prescriptions on File Prior to Visit  Medication Sig Dispense Refill  . acetaminophen (TYLENOL) 325 MG tablet Take 650 mg by mouth every 6 (six) hours as needed.    [name] amLODipine (NORVASC) 10 MG tablet Take 10 mg by mouth daily.    Lucilla Lame [place]-Castor Oil (VENELEX) OINT Apply to sacrum and bilateral buttocks q shift for prevention every shift    . Cholecalciferol (VITAMIN D-3) 1000 UNITS CAPS Take 1,000 Units by mouth daily.    . ferrous sulfate (KP FERROUS SULFATE) 325 (65 FE) MG tablet Take 325 mg by mouth daily with breakfast.    . folic acid (FOLVITE) 1 MG tablet Take 1 mg by mouth daily.    . insulin glargine (LANTUS) 100 unit/mL SOPN Inject 10 Units into the skin at bedtime.    . insulin lispro (HUMALOG KWIKPEN) 100 UNIT/ML KiwkPen Give per sliding scale four times a day    . levETIRAcetam (KEPPRA) 100  MG/ML solution Give 5 ml by mouth twice a day    . levothyroxine (SYNTHROID, LEVOTHROID) 25 MCG tablet Take 2 tablets (50 mcg total) by mouth daily before breakfast. 60 tablet 1  . lisinopril (PRINIVIL,ZESTRIL) 40 MG tablet Take 40 mg by mouth daily.    . pantoprazole (PROTONIX) 40 MG tablet Take 1 tablet (40 mg total) by mouth daily.     No current facility-administered medications on file prior to visit.     Review of Systems   Essentially unattainable secondary to dementia  Immunization History  Administered Date(s) Administered  . Influenza-Unspecified 05/29/2014, 07/31/2016  . PPD Test 11/01/2013, 10/18/2014  .  Pneumococcal-Unspecified 11/22/1998, 08/07/2016   Pertinent  Health Maintenance Due  Topic Date Due  . FOOT EXAM  10/13/2016  . HEMOGLOBIN A1C  11/30/2016  . URINE MICROALBUMIN  05/29/2017  . OPHTHALMOLOGY EXAM  08/07/2017  . PNA vac Low Risk Adult (2 of 2 - PCV13) 08/07/2017  . INFLUENZA VACCINE  Completed   No flowsheet data found. Functional Status Survey:    Vitals:   09/13/16 1147  BP: (!) 151/78  Pulse: 74  Resp: 19  Temp: 98.9 F (37.2 C)  TempSrc: Oral    Physical Exam In general this is a pleasant elderly male appears to be resting comfortably in bed he is responsive making eye contact appears to be at baseline relatively unchanged presentation compared to yesterday  His skin is warm and dry.  Eyes pupils appear reactive to light sclera and conjunctivae are clear visual acuity appears grossly intact-extraocular movements intact.  Chest is clear to auscultation there is no labored breathing. Somewhat poor respiratory effort  Heart is regular rate and rhythm with occasional irregular beat has baseline mild lower extremity edema bilaterally.  His abdomen soft nontender with positive bowel sounds.  Muscle skeletal does move all extremities 4 with baseline weakness still has some edema of his left arm with a history of humerus fracture.     Neurologic as noted above cranial nerves do appear to be intact .  Speech is somewhat slurred but this does not appear to  New and has a slight right mouth droop which also is not new Psych-se is oriented to self is following simple verbal commands he is pleasant no signs of agitation he smiles and makes eye contact.   Labs reviewed:  Recent Labs  08/02/16 0700 09/12/16 0500 09/13/16 0500  NA 137 139 141  K 3.9 2.5* 3.8  CL 106 100* 103  CO2 26 32 29  GLUCOSE 155* 108* 171*  BUN 27* 12 13  CREATININE 0.98 0.75 0.73  CALCIUM 8.6* 8.9 8.9  MG  --   --  1.7    Recent Labs  07/01/16 1602  07/14/16 1901 07/15/16 0639  AST 25 22 15   ALT 5* 18 15*  ALKPHOS 46 123 120  BILITOT 0.7 0.5 0.6  PROT 6.3* 6.4* 5.8*  ALBUMIN 3.3* 2.9* 2.5*    Recent Labs  07/18/16 1300 07/27/16 0705 08/02/16 0700 09/12/16 0500  WBC 9.0 5.3 5.5 6.8  NEUTROABS 6.4  --  3.2 4.5  HGB 7.9* 7.8* 9.7* 9.0*  HCT 24.8* 23.6* 30.2* 27.7*  MCV 97.6 94.4 93.5 84.7  PLT 464* 394 262 417*   Lab Results  Component Value Date   TSH 3.270 08/13/2016   Lab Results  Component Value Date   HGBA1C 7.5 (H) 09/12/2016   Lab Results  Component Value Date   CHOL 189 12/23/2014  HDL 44 12/23/2014   LDLCALC 125 (H) 12/23/2014   TRIG 101 12/23/2014   CHOLHDL 4.3 12/23/2014    Significant Diagnostic Results in last 30 days:  Dg Shoulder Right  Result Date: 08/29/2016 Clinical:  History of fracture of the left shoulder X-rays were done of the left shoulder, three views There is a comminuted fracture of the left proximal humerus with anterior positioning of humeral shaft.  Callus is present.  The humeral head is within the glenoid area.  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 Impression:  Comminuted fracture of the left proximal humerus with anterior positioning of the humeral shaft. Electronically Signed Darreld McleanWayne Keeling, MD 11/1/20172:17 PM    Assessment/Plan  #1 hypokalemia-this appears resolved with aggressive supplementation as noted above-we will reduce his potassium count 20 mEq a day and recheck a BMP stat tomorrow also check a BMP first laboratory day next week.  Note his magnesium appears to be within normal range at 1.7.  Clinically he appears to be stable has had a gradual decline which has been expected.  [id number]       London SheerLuster, Sally C, CMA [phone]

## 2016-09-14 ENCOUNTER — Encounter (HOSPITAL_COMMUNITY)
Admission: RE | Admit: 2016-09-14 | Discharge: 2016-09-14 | Disposition: A | Discharge status: Home / Self Care | Payer: Medicare Other | Source: Skilled Nursing Facility | Attending: Internal Medicine | Admitting: Internal Medicine

## 2016-09-14 DIAGNOSIS — E119 Type 2 diabetes mellitus without complications: Secondary | ICD-10-CM | POA: Diagnosis not present

## 2016-09-14 LAB — BASIC METABOLIC PANEL
Anion gap: 6 (ref 5–15)
BUN: 10 mg/dL (ref 6–20)
CO2: 32 mmol/L (ref 22–32)
CREATININE: 0.62 mg/dL (ref 0.61–1.24)
Calcium: 8.7 mg/dL — ABNORMAL LOW (ref 8.9–10.3)
Chloride: 103 mmol/L (ref 101–111)
Glucose, Bld: 73 mg/dL (ref 65–99)
POTASSIUM: 3.5 mmol/L (ref 3.5–5.1)
SODIUM: 141 mmol/L (ref 135–145)

## 2016-09-17 ENCOUNTER — Encounter (HOSPITAL_COMMUNITY)
Admission: RE | Admit: 2016-09-17 | Discharge: 2016-09-17 | Disposition: A | Discharge status: Home / Self Care | Payer: Medicare Other | Source: Skilled Nursing Facility | Attending: Internal Medicine | Admitting: Internal Medicine

## 2016-09-17 ENCOUNTER — Non-Acute Institutional Stay (SKILLED_NURSING_FACILITY): Payer: Medicare Other | Admitting: Internal Medicine

## 2016-09-17 DIAGNOSIS — E876 Hypokalemia: Secondary | ICD-10-CM | POA: Diagnosis not present

## 2016-09-17 DIAGNOSIS — E119 Type 2 diabetes mellitus without complications: Secondary | ICD-10-CM | POA: Diagnosis not present

## 2016-09-17 LAB — BASIC METABOLIC PANEL
Anion gap: 7 (ref 5–15)
BUN: 10 mg/dL (ref 6–20)
CHLORIDE: 101 mmol/L (ref 101–111)
CO2: 29 mmol/L (ref 22–32)
CREATININE: 0.6 mg/dL — AB (ref 0.61–1.24)
Calcium: 8.8 mg/dL — ABNORMAL LOW (ref 8.9–10.3)
GFR calc Af Amer: >60 mL/min (ref >60)
GFR calc non Af Amer: >60 mL/min (ref >60)
Glucose, Bld: 113 mg/dL — ABNORMAL HIGH (ref 65–99)
Potassium: 3.4 mmol/L — ABNORMAL LOW (ref 3.5–5.1)
Sodium: 137 mmol/L (ref 135–145)

## 2016-09-17 NOTE — Progress Notes (Signed)
This is an acute visit.  Level care skilled.  Facility [facility]  .  Chief complaint-acute visit secondary to hypokalemia  History of present illness.  Patient is a pleasant 82 year old male seen today for an acute visit for hypokalemia.  Routine lab on 09/12/2016 showed potassium of 2.5-L1. Been on low-dose potassium but this was discontinued since he was not on a diuretic and was already on lisinopril.  This was aggressively supplemented with 40 mEq of potassium 3 times that day-Potassium had normalized at 3.8 on the next day.  His magnesium level was within normal range at 1.7.  He was titrated down to 20 mEq of potassium on the 16th.  His potassium on lab done today shows mild hypokalemia at 3.4.  He continues to be stable clinically he is bright alert confused which is his baseline  Past Medical History:  Diagnosis Date  . Arthritis    "probably"  . Chronic kidney disease    "? stage" (08/18/2014)  . Chronic kidney disease   . Dementia    "don't know stage or type" (08/18/2014)  . Elevated cholesterol   . Hypertension   . PAF (paroxysmal atrial fibrillation) (HCC) 07/15/2016  . Seizures (HCC)   . Stroke (HCC)   . Type II diabetes mellitus (HCC)         Past Surgical History:  Procedure Laterality Date  . CATARACT EXTRACTION    . CORONARY ARTERY BYPASS GRAFT  ?1994   "CABG X3"  . TONSILLECTOMY      No Known Allergies        Current Outpatient Prescriptions on File Prior to Visit  Medication Sig Dispense Refill  . acetaminophen (TYLENOL) 325 MG tablet Take 650 mg by mouth every 6 (six) hours as needed.    [name]. amLODipine (NORVASC) 10 MG tablet Take 10 mg by mouth daily.    Lucilla Lame. Balsam [place]-Castor Oil (VENELEX) OINT Apply to sacrum and bilateral buttocks q shift for prevention every shift    . Cholecalciferol (VITAMIN D-3) 1000 UNITS CAPS Take 1,000 Units by mouth daily.    . ferrous sulfate (KP FERROUS SULFATE) 325 (65 FE)  MG tablet Take 325 mg by mouth daily with breakfast.    . folic acid (FOLVITE) 1 MG tablet Take 1 mg by mouth daily.    . insulin glargine (LANTUS) 100 unit/mL SOPN Inject 10 Units into the skin at bedtime.    . insulin lispro (HUMALOG KWIKPEN) 100 UNIT/ML KiwkPen Give per sliding scale four times a day    . levETIRAcetam (KEPPRA) 100 MG/ML solution Give 5 ml by mouth twice a day    . levothyroxine (SYNTHROID, LEVOTHROID) 25 MCG tablet Take 2 tablets (50 mcg total) by mouth daily before breakfast. 60 tablet 1  . lisinopril (PRINIVIL,ZESTRIL) 40 MG tablet Take 40 mg by mouth daily.    . pantoprazole (PROTONIX) 40 MG tablet Take 1 tablet (40 mg total) by mouth daily.    OF NOTE--is also on potassium 20 mEq a day   No current facility-administered medications on file prior to visit.     Review of Systems   Essentially unattainable secondary to dementia      Immunization History  Administered Date(s) Administered  . Influenza-Unspecified 05/29/2014, 07/31/2016  . PPD Test 11/01/2013, 10/18/2014  . Pneumococcal-Unspecified 11/22/1998, 08/07/2016       Pertinent  Health Maintenance Due  Topic Date Due  . FOOT EXAM  10/13/2016  . HEMOGLOBIN A1C  11/30/2016  . URINE MICROALBUMIN  05/29/2017  . OPHTHALMOLOGY EXAM  08/07/2017  . PNA vac Low Risk Adult (2 of 2 - PCV13) 08/07/2017  . INFLUENZA VACCINE  Completed   No flowsheet data found. Functional Status Survey:     He is afebrile pulse is 80 respirations of 17 Physical Exam In general this is a pleasant elderly male appears to be resting comfortably in bed he is responsive making eye contact appears to be at baseline--- currently being fed supper by nursing tech appears to be doing well with this    His skin is warm and dry.  Eyes pupils appear reactive to light sclera and conjunctivae are clear visual acuity appears grossly intact-extraocular movements intact.  Chest is clear to auscultation there is  no labored breathing. Somewhat poor respiratory effort  Heart is regular   rate and rhythm with l irregular beats has baseline mild lower extremity edema bilaterally.  His abdomen soft nontender with positive bowel sounds.  Muscle skeletal does move all extremities 4 with baseline weakness still has some edema of his left arm with a history of humerus fracture.  \Has protective boots on bilaterally     Neurologic as noted above cranial nerves do appear to be intact .  Speech is somewhat slurred but this does not appear to anything new   and has a slight right mouth droop which also is not new Psych-se is oriented to self is following simple verbal commands he is pleasant no signs of agitationhe smiles and makes eye contact.Currently being fed dinner and appears to be doing well   Labs reviewed:  09/17/2016.  Sodium 137 potassium 3.4 BUN 10 creatinine 0.6  Recent Labs (within last 365 days)   Recent Labs  08/02/16 0700 09/12/16 0500 09/13/16 0500  NA 137 139 141  K 3.9 2.5* 3.8  CL 106 100* 103  CO2 26 32 29  GLUCOSE 155* 108* 171*  BUN 27* 12 13  CREATININE 0.98 0.75 0.73  CALCIUM 8.6* 8.9 8.9  MG  --   --  1.7      Recent Labs (within last 365 days)   Recent Labs  07/01/16 1602 07/14/16 1901 07/15/16 0639  AST 25 22 15   ALT 5* 18 15*  ALKPHOS 46 123 120  BILITOT 0.7 0.5 0.6  PROT 6.3* 6.4* 5.8*  ALBUMIN 3.3* 2.9* 2.5*      Recent Labs (within last 365 days)   Recent Labs  07/18/16 1300 07/27/16 0705 08/02/16 0700 09/12/16 0500  WBC 9.0 5.3 5.5 6.8  NEUTROABS 6.4  --  3.2 4.5  HGB 7.9* 7.8* 9.7* 9.0*  HCT 24.8* 23.6* 30.2* 27.7*  MCV 97.6 94.4 93.5 84.7  PLT 464* 394 262 417*     Recent Labs       Lab Results  Component Value Date   TSH 3.270 08/13/2016     Recent Labs       Lab Results  Component Value Date   HGBA1C 7.5 (H) 09/12/2016     Recent Labs       Lab Results  Component Value Date   CHOL  189 12/23/2014   HDL 44 12/23/2014   LDLCALC 125 (H) 12/23/2014   TRIG 101 12/23/2014   CHOLHDL 4.3 12/23/2014      Significant Diagnostic Results in last 30 days:   Imaging Results  Dg Shoulder Right  Result Date: 08/29/2016 Clinical:  History of fracture of the left shoulder X-rays were done of the left shoulder, three views There is  a comminuted fracture of the left proximal humerus with anterior positioning of humeral shaft.  Callus is present.  The humeral head is within the glenoid area.  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 Impression:  Comminuted fracture of the left proximal humerus with anterior positioning of the humeral shaft. Electronically Signed Darreld McleanWayne Keeling, MD 11/1/20172:17 PM    Assessment and plan.  Persistent hypokalemia-will increase his potassium up to 20 mEq twice a day and recheck a metabolic panel on Thursday, November 23 and also update BMP in  10 days to ensure stability-  Clinically he appears to be stable and doing relatively well.  [id number]

## 2016-09-20 ENCOUNTER — Encounter (HOSPITAL_COMMUNITY)
Admission: RE | Admit: 2016-09-20 | Discharge: 2016-09-20 | Disposition: A | Discharge status: Home / Self Care | Payer: Medicare Other | Source: Skilled Nursing Facility | Attending: *Deleted | Admitting: *Deleted

## 2016-09-20 DIAGNOSIS — E119 Type 2 diabetes mellitus without complications: Secondary | ICD-10-CM | POA: Diagnosis not present

## 2016-09-20 LAB — BASIC METABOLIC PANEL
Anion gap: 8 (ref 5–15)
BUN: 13 mg/dL (ref 6–20)
CALCIUM: 9.5 mg/dL (ref 8.9–10.3)
CO2: 27 mmol/L (ref 22–32)
CREATININE: 0.69 mg/dL (ref 0.61–1.24)
Chloride: 104 mmol/L (ref 101–111)
GFR calc Af Amer: >60 mL/min (ref >60)
GLUCOSE: 53 mg/dL — AB (ref 65–99)
POTASSIUM: 4 mmol/L (ref 3.5–5.1)
SODIUM: 139 mmol/L (ref 135–145)

## 2016-09-24 ENCOUNTER — Encounter (HOSPITAL_COMMUNITY)
Admission: RE | Admit: 2016-09-24 | Discharge: 2016-09-24 | Disposition: A | Discharge status: Home / Self Care | Payer: Medicare Other | Source: Skilled Nursing Facility | Attending: *Deleted | Admitting: *Deleted

## 2016-09-24 DIAGNOSIS — E119 Type 2 diabetes mellitus without complications: Secondary | ICD-10-CM | POA: Diagnosis not present

## 2016-09-24 LAB — BASIC METABOLIC PANEL
Anion gap: 6 (ref 5–15)
BUN: 13 mg/dL (ref 6–20)
CO2: 29 mmol/L (ref 22–32)
Calcium: 9.2 mg/dL (ref 8.9–10.3)
Chloride: 103 mmol/L (ref 101–111)
Creatinine, Ser: 0.61 mg/dL (ref 0.61–1.24)
GFR calc Af Amer: >60 mL/min (ref >60)
Glucose, Bld: 181 mg/dL — ABNORMAL HIGH (ref 65–99)
Potassium: 3.5 mmol/L (ref 3.5–5.1)
SODIUM: 138 mmol/L (ref 135–145)

## 2016-09-26 ENCOUNTER — Encounter: Payer: Self-pay | Admitting: Orthopaedic Surgery

## 2016-09-27 ENCOUNTER — Ambulatory Visit (INDEPENDENT_AMBULATORY_CARE_PROVIDER_SITE_OTHER): Payer: Self-pay | Admitting: Orthopaedic Surgery

## 2016-09-27 ENCOUNTER — Encounter (HOSPITAL_COMMUNITY)
Admission: RE | Admit: 2016-09-27 | Discharge: 2016-09-27 | Disposition: A | Discharge status: Home / Self Care | Payer: Medicare Other | Source: Skilled Nursing Facility | Attending: *Deleted | Admitting: *Deleted

## 2016-09-27 DIAGNOSIS — S42295D Other nondisplaced fracture of upper end of left humerus, subsequent encounter for fracture with routine healing: Secondary | ICD-10-CM

## 2016-09-27 DIAGNOSIS — E119 Type 2 diabetes mellitus without complications: Secondary | ICD-10-CM | POA: Diagnosis not present

## 2016-09-27 LAB — BASIC METABOLIC PANEL
Anion gap: 6 (ref 5–15)
BUN: 16 mg/dL (ref 6–20)
CALCIUM: 9 mg/dL (ref 8.9–10.3)
CO2: 30 mmol/L (ref 22–32)
CREATININE: 0.7 mg/dL (ref 0.61–1.24)
Chloride: 102 mmol/L (ref 101–111)
GFR calc Af Amer: >60 mL/min (ref >60)
Glucose, Bld: 155 mg/dL — ABNORMAL HIGH (ref 65–99)
POTASSIUM: 3.6 mmol/L (ref 3.5–5.1)
SODIUM: 138 mmol/L (ref 135–145)

## 2016-09-27 NOTE — Progress Notes (Signed)
CC:  My shoulder is better  He is a resident at [facility].  They had x-rays done on mobile system.  I have only the report of a healing displaced proximal humerus fracture.  He is in less pain. Sling is intact.  NV intact.  Motion less tender.  Encounter Diagnosis  Name Primary?  . Other closed nondisplaced fracture of proximal end of left humerus with routine healing, subsequent encounter Yes   Begin PT/OT.  Return in two weeks.  X-rays prior.  Call if any problem.  Forms for nursing home completed.  Electronically Signed Darreld McleanWayne Annalyce Lanpher, MD 11/30/201710:31 AM

## 2016-10-04 ENCOUNTER — Encounter: Payer: Self-pay | Admitting: Internal Medicine

## 2016-10-04 ENCOUNTER — Non-Acute Institutional Stay (SKILLED_NURSING_FACILITY): Payer: Medicare Other | Admitting: Internal Medicine

## 2016-10-04 DIAGNOSIS — E1165 Type 2 diabetes mellitus with hyperglycemia: Secondary | ICD-10-CM

## 2016-10-04 DIAGNOSIS — R55 Syncope and collapse: Secondary | ICD-10-CM | POA: Diagnosis not present

## 2016-10-04 DIAGNOSIS — E039 Hypothyroidism, unspecified: Secondary | ICD-10-CM

## 2016-10-04 DIAGNOSIS — E118 Type 2 diabetes mellitus with unspecified complications: Secondary | ICD-10-CM | POA: Diagnosis not present

## 2016-10-04 DIAGNOSIS — F0150 Vascular dementia without behavioral disturbance: Secondary | ICD-10-CM

## 2016-10-04 DIAGNOSIS — I10 Essential (primary) hypertension: Secondary | ICD-10-CM

## 2016-10-04 DIAGNOSIS — I480 Paroxysmal atrial fibrillation: Secondary | ICD-10-CM

## 2016-10-04 NOTE — Progress Notes (Signed)
Location:   [facility] Room Number: 104/D Place of Service:  SNF (31) Provider:  Beola Cord, NP  Patient Care Team: Marletta Lor, NP as PCP - General (Nurse Practitioner)  Extended Emergency Contact Information Primary Emergency Contact: Welborne,[name] Address: [address] [place], [place] [address] [name] Home Phone: [phone] Mobile Phone: [phone] Relation: Daughter Secondary Emergency Contact: Gibson Ramp Address: [address]          [place], [place] [address] [name] Home Phone: [phone] Mobile Phone: [phone] Relation: Relative  Code Status:  DNR Goals of care: Advanced Directive information Advanced Directives 10/04/2016  Does Patient Have a Medical Advance Directive? Yes  Type of Advance Directive Out of facility DNR (pink MOST or yellow form)  Does patient want to make changes to medical advance directive? No - Patient declined  Copy of Healthcare Power of Attorney in Chart? -  Pre-existing out of facility DNR order (yellow form or pink MOST form) -     Chief Complaint  Patient presents with  . Medical Management of Chronic Issues    Routine Visit  For medical management of chronic medical conditions including dementia-history of syncope-diabetes type 2-hypertension-seizure disorder-anemia- Left proximal humerus fracture HPI:  Pt is a 82 y.o. Todd [name] seen today for medical management of chronic diseases.  As noted above. Patient continues to be quite frail however his weight appears to have stabilized according nursing staff he is eating somewhat better.  He is under largely comfort measures secondary to his numerous comorbidities and progression of dementia.  He does have a history of recurrent episodes of unresponsiveness questionable syncope-there is been some thought possibly this may be more cardiac related or seizure related-however no clear etiology has  really been determined.  Nonetheless he appears to have these episodes and then become responsive fairly quickly thereafter.  Family does not wish any further hospitalizations or aggressive workup of this.  He is a type II diabetic currently on Lantus 10 units a day as well as Huma log sliding scale insulin-blood sugars actually appear to be fairly stable Largely in the lower mid 100s in the morning-this appears to be the case at noon as well with occasional readings above 200-at 4 PM blood sugars appear to be more in the higher 100s lower 200 range.  At at bedtime he's more in the 200s range.  Again emphasis here is to avoid hypoglycemia-hemoglobin A1c on 09/12/2016 was 7.5 which actually has shown improvement area  Also has a history of hypertension and appears at times have somewhat elevated readings since his 151/89-150/64 recently-however apparently he does well once he receives his blood pressure medications and actually I got 110/60 manually this afternoon.  He is on lisinopril 40 mg a day as well as Norvasc 10 mg a day.  He also has a recent left proximal humerus fracture which is followed by orthopedics he had significant discomfort at one time and swelling he still has some edema -- for pain appears to be under better control he does receive Tylenol when necessary  He also has a history of anemia hemoglobin of 9 most recent lab appears to be relatively baseline he is on iron at one point had been on Eliquis for suspected atrial fibrillation however this was discontinued secondary to concerns of a dropping hemoglobin and possible GI bleed  Another recent issue was hypokalemia potassium of of 2.5  on lab done 09/12/2016 this was aggressively supplemented and potassium has been stable recently most recently 3.6 on lab done November 30 we will update this.  Currently is resting in bed comfortably nursing staff does not report any recent acute issues they feel he is eating somewhat better  and gotten a bit stronger although again he has general frailty with progressing dementia with fairly conservative interventions per family wishes     Past Medical History:  Diagnosis Date  . Arthritis    "probably"  . Chronic kidney disease    "? stage" (08/18/2014)  . Chronic kidney disease   . Dementia    "don't know stage or type" (08/18/2014)  . Elevated cholesterol   . Hypertension   . PAF (paroxysmal atrial fibrillation) (HCC) 07/15/2016  . Seizures (HCC)   . Stroke (HCC)   . Type II diabetes mellitus (HCC)    Past Surgical History:  Procedure Laterality Date  . CATARACT EXTRACTION    . CORONARY ARTERY BYPASS GRAFT  ?1994   "CABG X3"  . TONSILLECTOMY      No Known Allergies  Current Outpatient Prescriptions on File Prior to Visit  Medication Sig Dispense Refill  . acetaminophen (TYLENOL) 325 MG tablet Take 650 mg by mouth every 6 (six) hours as needed.    [name]. amLODipine (NORVASC) 10 MG tablet Take 10 mg by mouth daily.    Lucilla Lame. Balsam [place]-Castor Oil (VENELEX) OINT Apply to sacrum and bilateral buttocks q shift for prevention every shift    . Cholecalciferol (VITAMIN D-3) 1000 UNITS CAPS Take 1,000 Units by mouth daily.    . ferrous sulfate (KP FERROUS SULFATE) 325 (65 FE) MG tablet Take 325 mg by mouth daily with breakfast.    . folic acid (FOLVITE) 1 MG tablet Take 1 mg by mouth daily.    . insulin glargine (LANTUS) 100 unit/mL SOPN Inject 10 Units into the skin at bedtime.    . insulin lispro (HUMALOG KWIKPEN) 100 UNIT/ML KiwkPen Give per sliding scale four times a day    . levETIRAcetam (KEPPRA) 100 MG/ML solution Give 5 ml by mouth twice a day    . levothyroxine (SYNTHROID, LEVOTHROID) 25 MCG tablet Take 2 tablets (50 mcg total) by mouth daily before breakfast. 60 tablet 1  . lisinopril (PRINIVIL,ZESTRIL) 40 MG tablet Take 40 mg by mouth daily.    . pantoprazole (PROTONIX) 40 MG tablet Take 1 tablet (40 mg total) by mouth daily.     No current facility-administered  medications on file prior to visit.     Review of Systems   Unattainable secondary to dementia please see history of present illness  Immunization History  Administered Date(s) Administered  . Influenza-Unspecified 05/29/2014, 07/31/2016  . PPD Test 11/01/2013, 10/18/2014  . Pneumococcal-Unspecified 11/22/1998, 08/07/2016   Pertinent  Health Maintenance Due  Topic Date Due  . FOOT EXAM  10/13/2016  . HEMOGLOBIN A1C  03/12/2017  . URINE MICROALBUMIN  05/29/2017  . OPHTHALMOLOGY EXAM  08/07/2017  . PNA vac Low Risk Adult (2 of 2 - PCV13) 08/07/2017  . INFLUENZA VACCINE  Completed   No flowsheet data found. Functional Status Survey:    Vitals:   10/04/16 1504  BP: (!) 150/64  Pulse: 89  Resp: 20  Temp: 97.2 F (36.2 C)  TempSrc: Oral  Note--manual blood pressure today was 110/60 Weight  is 170 pounds Physical Exam   In general this is a pleasant elderly Todd [name] appears to be resting comfortably in bed he is  responsive making eye contact appears to be at baseline--continues to be quick to smile s   His skin is warm and dry.  Eyes pupils appear reactive to light sclera and conjunctivae are clear visual acuity appears grossly intact-extraocular movements intact.  Chest is clear to auscultation there is no labored breathing.Somewhat poor respiratory effort  Heart is regular   rate and rhythm with  irregular beats has baseline mild lower extremity edema bilaterally.  His abdomen soft nontender with positive bowel sounds.  Muscle skeletal does move all extremities 4 with baseline weakness still has some edema of his left arm with a history of humerus fracture radial pulse is intact does not appear to be acutely tender to palpation.  \Has protective boots on bilaterally     Neurologic as noted above cranial nerves do appear to be intact .  Speech is somewhat slurred but this does not appear to anything new   and has a slight right mouth droop which also  is not new Psych- is oriented to self is following simple verbal commands he is pleasant no signs of agitationhe smiles and makes eye contact.  Labs reviewed:  Recent Labs  09/13/16 0500  09/20/16 0712 09/24/16 0730 09/27/16 0500  NA 141  < > 139 138 138  K 3.8  < > 4.0 3.5 3.6  CL 103  < > 104 103 102  CO2 29  < > 27 29 30   GLUCOSE 171*  < > 53* 181* 155*  BUN 13  < > 13 13 16   CREATININE 0.73  < > 0.69 0.61 0.70  CALCIUM 8.9  < > 9.5 9.2 9.0  MG 1.7  --   --   --   --   < > = values in this interval not displayed.  Recent Labs  07/01/16 1602 07/14/16 1901 07/15/16 0639  AST 25 22 15   ALT 5* 18 15*  ALKPHOS 46 123 120  BILITOT 0.7 0.5 0.6  PROT 6.3* 6.4* 5.8*  ALBUMIN 3.3* 2.9* 2.5*    Recent Labs  07/18/16 1300 07/27/16 0705 08/02/16 0700 09/12/16 0500  WBC 9.0 5.3 5.5 6.8  NEUTROABS 6.4  --  3.2 4.5  HGB 7.9* 7.8* 9.7* 9.0*  HCT 24.8* 23.6* 30.2* 27.7*  MCV 97.6 94.4 93.5 84.7  PLT 464* 394 262 417*   Lab Results  Component Value Date   TSH 3.270 08/13/2016   Lab Results  Component Value Date   HGBA1C 7.5 (H) 09/12/2016   Lab Results  Component Value Date   CHOL 189 12/23/2014   HDL 44 12/23/2014   LDLCALC 125 (H) 12/23/2014   TRIG 101 12/23/2014   CHOLHDL 4.3 12/23/2014    Significant Diagnostic Results in last 30 days:  No results found.  Assessment/Plan #1 history of dementia-he appears to be stable in this regards although appears to be gradually progressing-he continues to do fairly well with supportive calvarium ever appears to be eating somewhat better-weights appear be around 170 recently at this point will monitor this appears to have stabilized somewhat.  #2 history of syncope of unclear etiology again he appears to have short episodes and then responds fairly quickly-question seizures or cardiac-family does not wish aggressive workup or further hospitalizations for this at this point will monitor continue supportive care.  #3  history diabetes type 2 as noted above this appears to be fairly stable currently on Lantus as well as sliding scale insulin will monitor for now.  #4 history hypertension again  has variable blood pressure readings appears when he receives his blood pressure medications he has significant improvement I got 110/60 manually today at this point will monitor.  #5 history of anemia-this appears relatively stable with a hemoglobin 9.0 will update this-he is on iron-also on Protonix for GI protective effect.  #6 history of suspected-fibrillation this appears rate controlled he is not on the limiting agent no longer on Eliquis secondary to concerns of possible GI bleed and anemia we will update a CBC.  #7 history of possible seizure disorder he is on Keppra twice a day continue to monitor no recent overt seizures to my knowledge although again there is some thought possibly his syncope may be seizure related.  #8-history of hypothyroidism he is on Synthroid 50 g a day TSH is recently been within normal range  3.270 on lab done 08/13/2016  #9-history of hypokalemia this is being supplemented currently at 30 a.m. he chews a day will update a BMP.  #10 history of left proximal humerus fracture this is followed by orthopedics and appears to be healing this pain appears to be under much better control.  Again will order a CBC and BMP for updated values continue to monitor again with family wishes for fairly conservative care here basically comfort measures.  [id number] of note greater than 35 minutes spent assessing patient-reviewing his chart-reviewing his labs-discussing his status with nursing staff-and coordinating  and formulating   a plan of care ofornumerous diagnoses-of note greater than 50% of time spent coordinating plan of care

## 2016-10-05 ENCOUNTER — Encounter (HOSPITAL_COMMUNITY)
Admission: AD | Admit: 2016-10-05 | Discharge: 2016-10-05 | Disposition: A | Discharge status: Home / Self Care | Payer: Medicare Other | Source: Skilled Nursing Facility | Attending: Internal Medicine | Admitting: Internal Medicine

## 2016-10-05 ENCOUNTER — Non-Acute Institutional Stay (SKILLED_NURSING_FACILITY): Payer: Medicare Other | Admitting: Internal Medicine

## 2016-10-05 ENCOUNTER — Encounter: Payer: Self-pay | Admitting: Internal Medicine

## 2016-10-05 DIAGNOSIS — E876 Hypokalemia: Secondary | ICD-10-CM | POA: Diagnosis not present

## 2016-10-05 DIAGNOSIS — D631 Anemia in chronic kidney disease: Secondary | ICD-10-CM

## 2016-10-05 DIAGNOSIS — R1312 Dysphagia, oropharyngeal phase: Secondary | ICD-10-CM | POA: Insufficient documentation

## 2016-10-05 DIAGNOSIS — M810 Age-related osteoporosis without current pathological fracture: Secondary | ICD-10-CM | POA: Insufficient documentation

## 2016-10-05 DIAGNOSIS — N189 Chronic kidney disease, unspecified: Secondary | ICD-10-CM

## 2016-10-05 DIAGNOSIS — E162 Hypoglycemia, unspecified: Secondary | ICD-10-CM

## 2016-10-05 DIAGNOSIS — E119 Type 2 diabetes mellitus without complications: Secondary | ICD-10-CM | POA: Insufficient documentation

## 2016-10-05 DIAGNOSIS — D696 Thrombocytopenia, unspecified: Secondary | ICD-10-CM | POA: Diagnosis not present

## 2016-10-05 DIAGNOSIS — F0390 Unspecified dementia without behavioral disturbance: Secondary | ICD-10-CM | POA: Insufficient documentation

## 2016-10-05 LAB — CBC
HCT: 30 % — ABNORMAL LOW (ref 39.0–52.0)
Hemoglobin: 9.5 g/dL — ABNORMAL LOW (ref 13.0–17.0)
MCH: 27 pg (ref 26.0–34.0)
MCHC: 31.7 g/dL (ref 30.0–36.0)
MCV: 85.2 fL (ref 78.0–100.0)
PLATELETS: 299 10*3/uL (ref 150–400)
RBC: 3.52 MIL/uL — ABNORMAL LOW (ref 4.22–5.81)
RDW: 18.3 % — AB (ref 11.5–15.5)
WBC: 7.3 10*3/uL (ref 4.0–10.5)

## 2016-10-05 LAB — BASIC METABOLIC PANEL
ANION GAP: 6 (ref 5–15)
BUN: 21 mg/dL — ABNORMAL HIGH (ref 6–20)
CALCIUM: 9.5 mg/dL (ref 8.9–10.3)
CO2: 30 mmol/L (ref 22–32)
CREATININE: 0.74 mg/dL (ref 0.61–1.24)
Chloride: 105 mmol/L (ref 101–111)
Glucose, Bld: 46 mg/dL — ABNORMAL LOW (ref 65–99)
Potassium: 3.6 mmol/L (ref 3.5–5.1)
Sodium: 141 mmol/L (ref 135–145)

## 2016-10-05 NOTE — Progress Notes (Signed)
Location:   [facility] Room Number: 104/D Place of Service:  SNF [phone]) Provider:  Keene BreathArlo Mi Balla  Barr, Julie, NP  Patient Care Team: Marletta LorJulie Barr, NP as PCP - General (Nurse Practitioner)  Extended Emergency Contact Information Primary Emergency Contact: Welborne,[name] Address: [address]          [place], [place] [id number] [place] Home Phone: [phone] Mobile Phone: [phone] Relation: Daughter Secondary Emergency Contact: Gibson RampWelborne,Gloria Address: [address]          [place], [place] [address] [place] Home Phone: [phone] Mobile Phone: [phone] Relation: Relative  Code Status:  DNR Goals of care: Advanced Directive information Advanced Directives 10/05/2016  Does Patient Have a Medical Advance Directive? Yes  Type of Advance Directive Out of facility DNR (pink MOST or yellow form)  Does patient want to make changes to medical advance directive? No - Patient declined  Copy of Healthcare Power of Attorney in Chart? -  Pre-existing out of facility DNR order (yellow form or pink MOST form) -     Chief Complaint  Patient presents with  . Acute Visit    Low BS    HPI:  Pt is a 82 y.o. male seen today for an acute visit for Low blood sugar of 44 this morning.  He is a type II diabetic on Lantus 10 units daily at bedtime as well as sliding scale insulin 4 times a day.  He is actually seen earlier this week-blood sugars appear to be fairly stable although occasionally he will have a lower reading in the 60s in the morning.  However today's blood sugar was 44-this did come up fairly rapidly with supplementation.  Currently he is resting in bed comfortably vital signs are stable  Apparently at times does have a spotty appetite.  Per chart review I see he did not receive any sliding scale insulin last night at bedtime- blood sugar was 158     Past Medical History:  Diagnosis Date  . Arthritis      "probably"  . Chronic kidney disease    "? stage" (08/18/2014)  . Chronic kidney disease   . Dementia    "don't know stage or type" (08/18/2014)  . Elevated cholesterol   . Hypertension   . PAF (paroxysmal atrial fibrillation) (HCC) 07/15/2016  . Seizures (HCC)   . Stroke (HCC)   . Type II diabetes mellitus (HCC)    Past Surgical History:  Procedure Laterality Date  . CATARACT EXTRACTION    . CORONARY ARTERY BYPASS GRAFT  ?1994   "CABG X3"  . TONSILLECTOMY      No Known Allergies  Current Outpatient Prescriptions on File Prior to Visit  Medication Sig Dispense Refill  . acetaminophen (TYLENOL) 325 MG tablet Take 650 mg by mouth every 6 (six) hours as needed.    [name]. amLODipine (NORVASC) 10 MG tablet Take 10 mg by mouth daily.    Lucilla Lame. Balsam [place]-Castor Oil (VENELEX) OINT Apply to sacrum and bilateral buttocks q shift for prevention every shift    . Cholecalciferol (VITAMIN D-3) 1000 UNITS CAPS Take 1,000 Units by mouth daily.    . ferrous sulfate (KP FERROUS SULFATE) 325 (65 FE) MG tablet Take 325 mg by mouth daily with breakfast.    . folic acid (FOLVITE) 1 MG tablet Take 1 mg by mouth daily.    . insulin lispro (HUMALOG KWIKPEN) 100 UNIT/ML KiwkPen Give per sliding scale three times a  day    . levETIRAcetam (KEPPRA) 100 MG/ML solution Give 5 ml by mouth twice a day    . levothyroxine (SYNTHROID, LEVOTHROID) 25 MCG tablet Take 2 tablets (50 mcg total) by mouth daily before breakfast. 60 tablet 1  . lisinopril (PRINIVIL,ZESTRIL) 40 MG tablet Take 40 mg by mouth daily.    . pantoprazole (PROTONIX) 40 MG tablet Take 1 tablet (40 mg total) by mouth daily.    . potassium chloride SA (K-DUR,KLOR-CON) 20 MEQ tablet Take 20 mEq by mouth daily.     No current facility-administered medications on file prior to visit.     Review of Systems   Essentially unattainable secondary to dementia please see history of present illness  Immunization History  Administered Date(s) Administered  .  Influenza-Unspecified 05/29/2014, 07/31/2016  . PPD Test 11/01/2013, 10/18/2014  . Pneumococcal-Unspecified 11/22/1998, 08/07/2016   Pertinent  Health Maintenance Due  Topic Date Due  . FOOT EXAM  10/13/2016  . HEMOGLOBIN A1C  03/12/2017  . URINE MICROALBUMIN  05/29/2017  . OPHTHALMOLOGY EXAM  08/07/2017  . PNA vac Low Risk Adult (2 of 2 - PCV13) 08/07/2017  . INFLUENZA VACCINE  Completed   No flowsheet data found. Functional Status Survey:    Vitals:   10/05/16 1218  BP: (!) 126/40  Pulse: 80  Resp: 19  Temp: 97.6 F (36.4 C)  TempSrc: Oral    Physical Exam general this is a pleasant elderly male appears to be resting comfortably in bed he is responsive making eye contact appears to be at baseline--continues to be quick to smile s   His skin is warm and dry.  Eyes pupils appear reactive to light sclera and conjunctivae are clear visual acuity appears grossly intact-extraocular movements intact.  Chest is clear to auscultation there is no labored breathing.Somewhat poor respiratory effort  Heart is regular rate and rhythm with  irregular beatshas baseline mild lower extremity edema bilaterally.  .  Muscle skeletal does move all extremities 4 with baseline weakness still has some edema of his left arm with a history of humerus fracture radial pulse is intact does not appear to be acutely tender to palpation.  \Has protective boots on bilaterally     Neurologic as noted above cranial nerves do appear to be intact .  Speech is somewhat slurred but this does not appear to anything new and has a slight right mouth droop which also is not new Psych- is oriented to self is following simple verbal commands he is pleasant no signs of agitationhe smiles and makes eye contact.  Labs reviewed:  Recent Labs  09/13/16 0500  09/24/16 0730 09/27/16 0500 10/05/16 0700  NA 141  < > 138 138 141  K 3.8  < > 3.5 3.6 3.6  CL 103  < > 103 102 105  CO2  29  < > 29 30 30   GLUCOSE 171*  < > 181* 155* 46*  BUN 13  < > 13 16 21*  CREATININE 0.73  < > 0.61 0.70 0.74  CALCIUM 8.9  < > 9.2 9.0 9.5  MG 1.7  --   --   --   --   < > = values in this interval not displayed.  Recent Labs  07/01/16 1602 07/14/16 1901 07/15/16 0639  AST 25 22 15   ALT 5* 18 15*  ALKPHOS 46 123 120  BILITOT 0.7 0.5 0.6  PROT 6.3* 6.4* 5.8*  ALBUMIN 3.3* 2.9* 2.5*    Recent Labs  07/18/16 1300  08/02/16 0700 09/12/16 0500 10/05/16 0700  WBC 9.0  < > 5.5 6.8 7.3  NEUTROABS 6.4  --  3.2 4.5  --   HGB 7.9*  < > 9.7* 9.0* 9.5*  HCT 24.8*  < > 30.2* 27.7* 30.0*  MCV 97.6  < > 93.5 84.7 85.2  PLT 464*  < > 262 417* 299  < > = values in this interval not displayed. Lab Results  Component Value Date   TSH 3.270 08/13/2016   Lab Results  Component Value Date   HGBA1C 7.5 (H) 09/12/2016   Lab Results  Component Value Date   CHOL 189 12/23/2014   HDL 44 12/23/2014   LDLCALC 125 (H) 12/23/2014   TRIG 101 12/23/2014   CHOLHDL 4.3 12/23/2014    Significant Diagnostic Results in last 30 days:  No results found.  Assessment/Plan 1 diabetes type 2-it appears his blood sugars had been fairly stable in the morning largely in the lower 100s with an infrequent 1 somewhat lower in the 60s-however today was concerning for blood sugar in the 40s-we'll discontinue his Lantus secondary to apparently some still spotty by mouth intake continue to monitor and continue the sliding scale with meals-we'll eliminate sliding scale at night however secondary to concerns of hypoglycemia-call provider if CBG at night is greater than 300-at this point we may have to reinstitute Lantus but would like to be on the side of caution in regards to hypoglycemia at least for now.  #2 anemia this has been significant the past but show stability on lab done yesterday at 9.5 at this point will monitor periodically.--He is on iron  #3 history of hypokalemia this is been significant in the  past as well he has been restarted on potassium supplementation secondary to this and this appears stable with a potassium of 3.6 on lab done yesterday  #4 history of thrombocytopenia this has been stable now for some time which is reassuring platelets were 299,000 on lab done yesterday   [id number]      London SheerLuster, Sally C, CMA [phone]

## 2016-10-09 ENCOUNTER — Encounter: Payer: Self-pay | Admitting: Orthopaedic Surgery

## 2016-10-11 ENCOUNTER — Ambulatory Visit (INDEPENDENT_AMBULATORY_CARE_PROVIDER_SITE_OTHER): Payer: Medicare Other | Admitting: Orthopaedic Surgery

## 2016-10-11 ENCOUNTER — Encounter: Payer: Self-pay | Admitting: Orthopaedic Surgery

## 2016-10-11 VITALS — BP 128/61 | HR 97 | Temp 97.5°F

## 2016-10-11 DIAGNOSIS — S42295D Other nondisplaced fracture of upper end of left humerus, subsequent encounter for fracture with routine healing: Secondary | ICD-10-CM

## 2016-10-11 NOTE — Progress Notes (Signed)
CC:  My shoulder is sore  He has healing comminuted proximal humerus fracture displaced on the left.  Mobile x-rays were done.  He has limited motion.  He is not an operative candidate.  I have ordered OT gentle ROM for the left shoulder.  Encounter Diagnosis  Name Primary?  . Other closed nondisplaced fracture of proximal end of left humerus with routine healing, subsequent encounter Yes   Return in one month.  I am not ordering any x-rays for next visit.  Call if any problem.  Electronically Signed Darreld McleanWayne Rafael Salway, MD 12/14/201710:27 AM

## 2016-10-15 ENCOUNTER — Ambulatory Visit (HOSPITAL_COMMUNITY)
Admission: RE | Admit: 2016-10-15 | Discharge: 2016-10-15 | Disposition: A | Discharge status: Home / Self Care | Payer: Medicare Other | Source: Ambulatory Visit | Attending: Internal Medicine | Admitting: Internal Medicine

## 2016-10-15 DIAGNOSIS — S81809A Unspecified open wound, unspecified lower leg, initial encounter: Secondary | ICD-10-CM | POA: Diagnosis not present

## 2016-10-15 DIAGNOSIS — X58XXXA Exposure to other specified factors, initial encounter: Secondary | ICD-10-CM | POA: Diagnosis not present

## 2016-10-16 ENCOUNTER — Encounter: Payer: Self-pay | Admitting: Internal Medicine

## 2016-10-16 ENCOUNTER — Other Ambulatory Visit (HOSPITAL_COMMUNITY)
Admission: RE | Admit: 2016-10-16 | Discharge: 2016-10-16 | Disposition: A | Discharge status: Home / Self Care | Payer: Medicare Other | Source: Skilled Nursing Facility | Attending: Internal Medicine | Admitting: Internal Medicine

## 2016-10-16 ENCOUNTER — Non-Acute Institutional Stay (SKILLED_NURSING_FACILITY): Payer: Medicare Other | Admitting: Internal Medicine

## 2016-10-16 DIAGNOSIS — D500 Iron deficiency anemia secondary to blood loss (chronic): Secondary | ICD-10-CM | POA: Diagnosis present

## 2016-10-16 DIAGNOSIS — F0150 Vascular dementia without behavioral disturbance: Secondary | ICD-10-CM | POA: Diagnosis not present

## 2016-10-16 DIAGNOSIS — I10 Essential (primary) hypertension: Secondary | ICD-10-CM | POA: Diagnosis present

## 2016-10-16 DIAGNOSIS — E870 Hyperosmolality and hypernatremia: Secondary | ICD-10-CM | POA: Diagnosis not present

## 2016-10-16 DIAGNOSIS — R531 Weakness: Secondary | ICD-10-CM

## 2016-10-16 LAB — CBC WITH DIFFERENTIAL/PLATELET
BASOS ABS: 0 10*3/uL (ref 0.0–0.1)
BASOS PCT: 0 %
Eosinophils Absolute: 0.1 10*3/uL (ref 0.0–0.7)
Eosinophils Relative: 2 %
HEMATOCRIT: 33.2 % — AB (ref 39.0–52.0)
HEMOGLOBIN: 10 g/dL — AB (ref 13.0–17.0)
LYMPHS PCT: 15 %
Lymphs Abs: 1.2 10*3/uL (ref 0.7–4.0)
MCH: 26.5 pg (ref 26.0–34.0)
MCHC: 30.1 g/dL (ref 30.0–36.0)
MCV: 87.8 fL (ref 78.0–100.0)
MONO ABS: 0.5 10*3/uL (ref 0.1–1.0)
Monocytes Relative: 6 %
NEUTROS ABS: 6 10*3/uL (ref 1.7–7.7)
NEUTROS PCT: 77 %
Platelets: 463 10*3/uL — ABNORMAL HIGH (ref 150–400)
RBC: 3.78 MIL/uL — ABNORMAL LOW (ref 4.22–5.81)
RDW: 19.2 % — AB (ref 11.5–15.5)
WBC: 7.7 10*3/uL (ref 4.0–10.5)

## 2016-10-16 LAB — COMPREHENSIVE METABOLIC PANEL
ALBUMIN: 2.6 g/dL — AB (ref 3.5–5.0)
ALT: 11 U/L — AB (ref 17–63)
AST: 15 U/L (ref 15–41)
Alkaline Phosphatase: 73 U/L (ref 38–126)
Anion gap: 11 (ref 5–15)
BILIRUBIN TOTAL: 0.9 mg/dL (ref 0.3–1.2)
BUN: 35 mg/dL — AB (ref 6–20)
CALCIUM: 9.4 mg/dL (ref 8.9–10.3)
CO2: 26 mmol/L (ref 22–32)
CREATININE: 0.96 mg/dL (ref 0.61–1.24)
Chloride: 113 mmol/L — ABNORMAL HIGH (ref 101–111)
GFR calc Af Amer: >60 mL/min (ref >60)
GLUCOSE: 273 mg/dL — AB (ref 65–99)
Potassium: 3.6 mmol/L (ref 3.5–5.1)
Sodium: 150 mmol/L — ABNORMAL HIGH (ref 135–145)
TOTAL PROTEIN: 7.3 g/dL (ref 6.5–8.1)

## 2016-10-16 LAB — URINALYSIS, ROUTINE W REFLEX MICROSCOPIC
GLUCOSE, UA: NEGATIVE mg/dL
HGB URINE DIPSTICK: NEGATIVE
Ketones, ur: 15 mg/dL — AB
Leukocytes, UA: NEGATIVE
Nitrite: NEGATIVE
PROTEIN: NEGATIVE mg/dL
Specific Gravity, Urine: >1.03 — ABNORMAL HIGH (ref 1.005–1.030)
pH: 5.5 (ref 5.0–8.0)

## 2016-10-16 NOTE — Progress Notes (Signed)
Location:   [facility] Room Number: 104/D Place of Service:  SNF [phone]) Provider:  Keene Breath, NP  Patient Care Team: Marletta Lor, NP as PCP - General (Nurse Practitioner)  Extended Emergency Contact Information Primary Emergency Contact: Welborne,[name] Address: [address] [place], [place] [address] [name] Home Phone: [phone] Mobile Phone: [phone] Relation: Daughter Secondary Emergency Contact: Gibson Ramp Address: [address]          [place], [place] [address] [name] Home Phone: [phone] Mobile Phone: [phone] Relation: Relative  Code Status:  DNR Goals of care: Advanced Directive information Advanced Directives 10/16/2016  Does Patient Have a Medical Advance Directive? Yes  Type of Advance Directive Out of facility DNR (pink MOST or yellow form)  Does patient want to make changes to medical advance directive? No - Patient declined  Copy of Healthcare Power of Attorney in Chart? -  Pre-existing out of facility DNR order (yellow form or pink MOST form) -     Chief Complaint  Patient presents with  . Acute Visit    Change in Status    HPI:  Pt is a 82 y.o. male seen today for an acute visit for A change in status. Patient has been gradually declining with a history of dementia which has progressed.  He is a type II diabetic as well as currently on Lantus and Humalog sliding scale insulin.  Also has had a history of recurrent unresponsive episodes which may be syncopal no clear etiology really has been found and family does not really desire any aggressive workup is essentially under more comfort measures.  This is been complicated with a recent left proximal humerus fracture which is followed by orthopedics and felt to be relatively stable.  He also has a history at times of electrolyte abnormalities hypokalemia which is being supplemented also history of  anemia which is been relatively stable recently.  Patient recently appeared to be doing somewhat better eating and drinking better but apparently these had a change in status here according to nursing not as energetic not eating and drinking as well appears to be declining.  His vital signs are stable-but he has not been himself according nursing staff the last couple days.  He cannot really give any review of systems secondary to significant dementia.         Past Medical History:  Diagnosis Date  . Arthritis    "probably"  . Chronic kidney disease    "? stage" (08/18/2014)  . Chronic kidney disease   . Dementia    "don't know stage or type" (08/18/2014)  . Elevated cholesterol   . Hypertension   . PAF (paroxysmal atrial fibrillation) (HCC) 07/15/2016  . Seizures (HCC)   . Stroke (HCC)   . Type II diabetes mellitus (HCC)    Past Surgical History:  Procedure Laterality Date  . CATARACT EXTRACTION    . CORONARY ARTERY BYPASS GRAFT  ?1994   "CABG X3"  . TONSILLECTOMY      No Known Allergies  Current Outpatient Prescriptions on File Prior to Visit  Medication Sig Dispense Refill  . acetaminophen (TYLENOL) 325 MG tablet Take 650 mg by mouth every 6 (six) hours as needed.    [name] amLODipine (NORVASC) 10 MG tablet Take 10 mg by mouth daily.    Lucilla Lame [place]-Castor Oil (VENELEX) OINT Apply to sacrum and bilateral buttocks q  shift for prevention every shift    . Cholecalciferol (VITAMIN D-3) 1000 UNITS CAPS Take 1,000 Units by mouth daily.    . ferrous sulfate (KP FERROUS SULFATE) 325 (65 FE) MG tablet Take 325 mg by mouth daily with breakfast.    . folic acid (FOLVITE) 1 MG tablet Take 1 mg by mouth daily.    . insulin lispro (HUMALOG KWIKPEN) 100 UNIT/ML KiwkPen Give per sliding scale three times a day    . levETIRAcetam (KEPPRA) 100 MG/ML solution Give 5 ml by mouth twice a day    . levothyroxine (SYNTHROID, LEVOTHROID) 25 MCG tablet Take 2 tablets (50 mcg total) by mouth  daily before breakfast. 60 tablet 1  . lisinopril (PRINIVIL,ZESTRIL) 40 MG tablet Take 40 mg by mouth daily.    . pantoprazole (PROTONIX) 40 MG tablet Take 1 tablet (40 mg total) by mouth daily.    . potassium chloride SA (K-DUR,KLOR-CON) 20 MEQ tablet Take 30 mEq by mouth daily.      No current facility-administered medications on file prior to visit.      Review of Systems   Unattainable secondary to dementia please see history of present illness  Immunization History  Administered Date(s) Administered  . Influenza-Unspecified 05/29/2014, 07/31/2016  . PPD Test 11/01/2013, 10/18/2014  . Pneumococcal-Unspecified 11/22/1998, 08/07/2016   Pertinent  Health Maintenance Due  Topic Date Due  . FOOT EXAM  10/13/2016  . HEMOGLOBIN A1C  03/12/2017  . URINE MICROALBUMIN  05/29/2017  . OPHTHALMOLOGY EXAM  08/07/2017  . PNA vac Low Risk Adult (2 of 2 - PCV13) 08/07/2017  . INFLUENZA VACCINE  Completed   No flowsheet data found. Functional Status Survey:    Vitals:   10/16/16 1523  BP: 134/72  Pulse: 89  Resp: 18  Temp: 97.8 F (36.6 C)  TempSrc: Oral    Physical Exam   In general this is a very frail-appearing elderly male lying in bed he does not appear to be uncomfortable and is responsive continues to make eye contact and smile but appears increasingly weak s   His skin is warm and dry.  Eyes pupils appear reactive to light sclera and conjunctivae are clear visual acuity appears grossly intact-extraocular movements intact.  Chest is clear to auscultation there is no labored breathing. poor respiratory effort  Heart is regular rate and rhythm does not have much lower extremity edema at all.  His abdomen soft nontender with positive bowel sounds.  Muscle skeletal does move all extremities 4 with baseline weakness still has some edema of his left arm with a history of humerus fracture radial pulse is intact does not appear to be acutely tender to  palpation Edema appears to be slowly resolving.  He has covering over a left heel wound according to nursing there is some necrosis here     Neurologic as noted above cranial nerves do appear to be intact . Appears increasingly weak however  Speech is somewhat slurred but this does not appear to anything new and has a slight right mouth droop which also is not new Psych- is oriented to self is following simple verbal commands he is pleasant no signs of agitationhe smiles and makes eye contact.   Labs reviewed:  Recent Labs  09/13/16 0500  09/24/16 0730 09/27/16 0500 10/05/16 0700  NA 141  < > 138 138 141  K 3.8  < > 3.5 3.6 3.6  CL 103  < > 103 102 105  CO2 29  < >  29 30 30   GLUCOSE 171*  < > 181* 155* 46*  BUN 13  < > 13 16 21*  CREATININE 0.73  < > 0.61 0.70 0.74  CALCIUM 8.9  < > 9.2 9.0 9.5  MG 1.7  --   --   --   --   < > = values in this interval not displayed.  Recent Labs  07/01/16 1602 07/14/16 1901 07/15/16 0639  AST 25 22 15   ALT 5* 18 15*  ALKPHOS 46 123 120  BILITOT 0.7 0.5 0.6  PROT 6.3* 6.4* 5.8*  ALBUMIN 3.3* 2.9* 2.5*    Recent Labs  07/18/16 1300  08/02/16 0700 09/12/16 0500 10/05/16 0700  WBC 9.0  < > 5.5 6.8 7.3  NEUTROABS 6.4  --  3.2 4.5  --   HGB 7.9*  < > 9.7* 9.0* 9.5*  HCT 24.8*  < > 30.2* 27.7* 30.0*  MCV 97.6  < > 93.5 84.7 85.2  PLT 464*  < > 262 417* 299  < > = values in this interval not displayed. Lab Results  Component Value Date   TSH 3.270 08/13/2016   Lab Results  Component Value Date   HGBA1C 7.5 (H) 09/12/2016   Lab Results  Component Value Date   CHOL 189 12/23/2014   HDL 44 12/23/2014   LDLCALC 125 (H) 12/23/2014   TRIG 101 12/23/2014   CHOLHDL 4.3 12/23/2014    Significant Diagnostic Results in last 30 days:  [place] Arterial Seg Single  Result Date: 10/15/2016 CLINICAL DATA:  Nonhealing left heel wound. Dementia, previous stroke, left humerus fracture. EXAM: NONINVASIVE PHYSIOLOGIC VASCULAR  STUDY OF BILATERAL LOWER EXTREMITIES TECHNIQUE: Evaluation of both lower extremities were performed at rest, including calculation of ankle-brachial indices with single level Doppler, pressure recording. COMPARISON:  None. FINDINGS: Right ABI:  1.02 Left ABI:  1.30 Right Lower Extremity: Irregular distal waveforms limiting interpretation. Left Lower Extremity:  Triphasic waveforms distally. IMPRESSION: 1. No evidence of hemodynamically significant lower extremity arterial occlusive disease at rest. Electronically Signed   By: Corlis Leak  Hassell M.D.   On: 10/15/2016 14:47    Assessment/Plan  #1 increased weakness-with change in status-he appears to be declining family does not wish hospitalization-will order stat blood work including a CBC and metabolic panel.  I also reviewed his blood sugars which at times can run a bit on the higher side in the 200s however since he is not eating well would be hasn't be aggressive any further than the sliding scale he is currently on.  He does not appear to be in any distress but will await lab work results.  Addendum-we have obtained a stat lab work which just show an elevated sodium of 150 BUN is 35 creatinine is 0.96.  Potassium is 4.3.  His white count is within normal range at 7.7 hemoglobin is stable at 10.  Will start low-flow IV half-normal saline at 60 mL an hour for 2 L and obtain blood work after this.  Pending his responsiveness to this will defer further aggressive measures again his daughter is quite conservative here and essentially wants some more Comfortable and I suspect at this point we may be heading more to end-of-life care but will if the IV fluids try here to see if this helps somewhat.  [id number]-of note greater than 35 minutes spent assessing patient-reassessing patient-discussing his status with nursing staff and reviewing ordering and re- reviewing labs-as well as coordinating a plan of care.  .[name]  London SheerLuster, Sally C,  [place] [phone]

## 2016-10-17 ENCOUNTER — Encounter: Payer: Self-pay | Admitting: Internal Medicine

## 2016-10-17 ENCOUNTER — Non-Acute Institutional Stay (SKILLED_NURSING_FACILITY): Payer: Medicare Other | Admitting: Internal Medicine

## 2016-10-17 DIAGNOSIS — F0150 Vascular dementia without behavioral disturbance: Secondary | ICD-10-CM

## 2016-10-17 DIAGNOSIS — E1165 Type 2 diabetes mellitus with hyperglycemia: Secondary | ICD-10-CM | POA: Diagnosis not present

## 2016-10-17 DIAGNOSIS — I480 Paroxysmal atrial fibrillation: Secondary | ICD-10-CM

## 2016-10-17 DIAGNOSIS — E118 Type 2 diabetes mellitus with unspecified complications: Secondary | ICD-10-CM

## 2016-10-17 DIAGNOSIS — E870 Hyperosmolality and hypernatremia: Secondary | ICD-10-CM

## 2016-10-17 NOTE — Progress Notes (Signed)
Location:   [facility] Room Number: 104/D Place of Service:  SNF (31) Provider:  Beola Cord, NP  Patient Care Team: Marletta Lor, NP as PCP - General (Nurse Practitioner)  Extended Emergency Contact Information Primary Emergency Contact: Welborne,[name] Address: [address] [place], [place] [address] [name] Home Phone: [phone] Mobile Phone: [phone] Relation: Daughter Secondary Emergency Contact: Gibson Ramp Address: [address]          [place], [place] [address] [name] Home Phone: [phone] Mobile Phone: [phone] Relation: Relative  Code Status:  DNR Goals of care: Advanced Directive information Advanced Directives 10/17/2016  Does Patient Have a Medical Advance Directive? Yes  Type of Advance Directive Out of facility DNR (pink MOST or yellow form)  Does patient want to make changes to medical advance directive? No - Patient declined  Copy of Healthcare Power of Attorney in Chart? -  Pre-existing out of facility DNR order (yellow form or pink MOST form) -     Chief Complaint  Patient presents with  . Acute Visit    HPI:  Pt is a 82 y.o. male seen today for an acute visit for  Worsening Clinical status. Patient has history of PAF, Diabetes type 2, CRI, CVA and recurrent syncope  ? Seizures. And dementia According to the nurses patient has hardly eating and has lost almost 10 lbs.in past 1 month. His BS have been very erratic anywhere from 45 to 300. He was evaluated by Arlo yesterday and he ordered labs on him. His sodium was 150. With BUN of 35. He was started on Normal saline 30ml/hour. Patient is in his bed and is looks comfortable.He responds to his name but unable to get any history or Verbal response from him. His daughter and Son in law are at bedside.    Past Medical History:  Diagnosis Date  . Arthritis    "probably"  . Chronic kidney disease    "?  stage" (08/18/2014)  . Chronic kidney disease   . Dementia    "don't know stage or type" (08/18/2014)  . Elevated cholesterol   . Hypertension   . PAF (paroxysmal atrial fibrillation) (HCC) 07/15/2016  . Seizures (HCC)   . Stroke (HCC)   . Type II diabetes mellitus (HCC)    Past Surgical History:  Procedure Laterality Date  . CATARACT EXTRACTION    . CORONARY ARTERY BYPASS GRAFT  ?1994   "CABG X3"  . TONSILLECTOMY      No Known Allergies  Current Outpatient Prescriptions on File Prior to Visit  Medication Sig Dispense Refill  . acetaminophen (TYLENOL) 325 MG tablet Take 650 mg by mouth every 6 (six) hours as needed.    [name] amLODipine (NORVASC) 10 MG tablet Take 10 mg by mouth daily.    Lucilla Lame [place]-Castor Oil (VENELEX) OINT Apply to sacrum and bilateral buttocks q shift for prevention every shift    . Cholecalciferol (VITAMIN D-3) 1000 UNITS CAPS Take 1,000 Units by mouth daily.    . ferrous sulfate (KP FERROUS SULFATE) 325 (65 FE) MG tablet Take 325 mg by mouth daily with breakfast.    . folic acid (FOLVITE) 1 MG tablet Take 1 mg by mouth daily.    . insulin lispro (HUMALOG KWIKPEN) 100 UNIT/ML KiwkPen Give per sliding scale three times a day    . levETIRAcetam (KEPPRA) 100 MG/ML solution Give 5 ml by  mouth twice a day    . levothyroxine (SYNTHROID, LEVOTHROID) 25 MCG tablet Take 2 tablets (50 mcg total) by mouth daily before breakfast. 60 tablet 1  . lisinopril (PRINIVIL,ZESTRIL) 40 MG tablet Take 40 mg by mouth daily.    . pantoprazole (PROTONIX) 40 MG tablet Take 1 tablet (40 mg total) by mouth daily.    . potassium chloride SA (K-DUR,KLOR-CON) 20 MEQ tablet Take 30 mEq by mouth daily.      No current facility-administered medications on file prior to visit.      Review of Systems  Unable to perform ROS: Dementia    Immunization History  Administered Date(s) Administered  . Influenza-Unspecified 05/29/2014, 07/31/2016  . PPD Test 11/01/2013, 10/18/2014  .  Pneumococcal-Unspecified 11/22/1998, 08/07/2016   Pertinent  Health Maintenance Due  Topic Date Due  . FOOT EXAM  10/13/2016  . HEMOGLOBIN A1C  03/12/2017  . URINE MICROALBUMIN  05/29/2017  . OPHTHALMOLOGY EXAM  08/07/2017  . PNA vac Low Risk Adult (2 of 2 - PCV13) 08/07/2017  . INFLUENZA VACCINE  Completed   No flowsheet data found. Functional Status Survey:    Vitals:   10/17/16 1030  BP: 129/80  Pulse: 94  Resp: 18  Temp: 98.7 F (37.1 C)  TempSrc: Oral   There is no height or weight on file to calculate BMI. Physical Exam  Constitutional: He appears well-developed and well-nourished.  HENT:  Head: Normocephalic.  Mouth/Throat: Oropharynx is clear and moist.  Eyes: Pupils are equal, round, and reactive to light.  Cardiovascular: Normal rate and normal heart sounds.  An irregularly irregular rhythm present.  No murmur heard. Pulmonary/Chest: Effort normal and breath sounds normal. No respiratory distress. He has no wheezes. He has no rales.  Abdominal: Soft. Bowel sounds are normal. He exhibits no distension. There is no tenderness. There is no rebound.  Musculoskeletal: He exhibits no edema.  Lymphadenopathy:    He has no cervical adenopathy.  Neurological: He is alert.  Respond to his name but do not follow any commands.    Labs reviewed:  Recent Labs  09/13/16 0500  09/27/16 0500 10/05/16 0700 10/16/16 1459  NA 141  < > 138 141 150*  K 3.8  < > 3.6 3.6 3.6  CL 103  < > 102 105 113*  CO2 29  < > 30 30 26   GLUCOSE 171*  < > 155* 46* 273*  BUN 13  < > 16 21* 35*  CREATININE 0.73  < > 0.70 0.74 0.96  CALCIUM 8.9  < > 9.0 9.5 9.4  MG 1.7  --   --   --   --   < > = values in this interval not displayed.  Recent Labs  07/14/16 1901 07/15/16 0639 10/16/16 1459  AST 22 15 15   ALT 18 15* 11*  ALKPHOS 123 120 73  BILITOT 0.5 0.6 0.9  PROT 6.4* 5.8* 7.3  ALBUMIN 2.9* 2.5* 2.6*    Recent Labs  08/02/16 0700 09/12/16 0500 10/05/16 0700  10/16/16 1459  WBC 5.5 6.8 7.3 7.7  NEUTROABS 3.2 4.5  --  6.0  HGB 9.7* 9.0* 9.5* 10.0*  HCT 30.2* 27.7* 30.0* 33.2*  MCV 93.5 84.7 85.2 87.8  PLT 262 417* 299 463*   Lab Results  Component Value Date   TSH 3.270 08/13/2016   Lab Results  Component Value Date   HGBA1C 7.5 (H) 09/12/2016   Lab Results  Component Value Date   CHOL 189 12/23/2014  HDL 44 12/23/2014   LDLCALC 125 (H) 12/23/2014   TRIG 101 12/23/2014   CHOLHDL 4.3 12/23/2014    Significant Diagnostic Results in last 30 days:  [place] Arterial Seg Single  Result Date: 10/15/2016 CLINICAL DATA:  Nonhealing left heel wound. Dementia, previous stroke, left humerus fracture. EXAM: NONINVASIVE PHYSIOLOGIC VASCULAR STUDY OF BILATERAL LOWER EXTREMITIES TECHNIQUE: Evaluation of both lower extremities were performed at rest, including calculation of ankle-brachial indices with single level Doppler, pressure recording. COMPARISON:  None. FINDINGS: Right ABI:  1.02 Left ABI:  1.30 Right Lower Extremity: Irregular distal waveforms limiting interpretation. Left Lower Extremity:  Triphasic waveforms distally. IMPRESSION: 1. No evidence of hemodynamically significant lower extremity arterial occlusive disease at rest. Electronically Signed   By: Corlis Leak  Hassell M.D.   On: 10/15/2016 14:47    Assessment/Plan  Patient With Multi infarct dementia., Diabetes and Atrial fibrillation  I had long discussion with Patients daughter. Patient continues to decline. He has no signs of infection but he is not eating and now has hypernatremia. We had discussion about Feeding tube but patient is at high risk for aspiration and it will not add to his life, Per daughter he would not have liked to have tube.  I have explained to daughter that there is possibility he had another stoke leading to his rapid decline.  Daughter has decided to consider Hospice care for patient.  Hospice consult  Continue saline and then discontinue after this bag. Continue  Sliding scale for BS. Discontinue Vitamin D supplement. Will continue skin care. Patient seem very comfortable right now.   Family/ staff Communication: Total time spent in this patient care encounter was 45_ minutes; greater than 50% of the visit spent counseling patient and coordinating care for problems addressed at this encounter.   Labs/tests ordered:

## 2016-10-18 ENCOUNTER — Encounter (HOSPITAL_COMMUNITY)
Admission: RE | Admit: 2016-10-18 | Discharge: 2016-10-18 | Disposition: A | Discharge status: Home / Self Care | Payer: Medicare Other | Source: Skilled Nursing Facility | Attending: Internal Medicine | Admitting: Internal Medicine

## 2016-10-18 DIAGNOSIS — E119 Type 2 diabetes mellitus without complications: Secondary | ICD-10-CM | POA: Diagnosis not present

## 2016-10-18 LAB — BASIC METABOLIC PANEL
ANION GAP: 13 (ref 5–15)
BUN: 30 mg/dL — AB (ref 6–20)
CALCIUM: 9.2 mg/dL (ref 8.9–10.3)
CO2: 24 mmol/L (ref 22–32)
CREATININE: 0.98 mg/dL (ref 0.61–1.24)
Chloride: 119 mmol/L — ABNORMAL HIGH (ref 101–111)
GFR calc Af Amer: >60 mL/min (ref >60)
GFR calc non Af Amer: >60 mL/min (ref >60)
GLUCOSE: 309 mg/dL — AB (ref 65–99)
Potassium: 3.3 mmol/L — ABNORMAL LOW (ref 3.5–5.1)
Sodium: 156 mmol/L — ABNORMAL HIGH (ref 135–145)

## 2016-10-18 LAB — URINE CULTURE: CULTURE: NO GROWTH

## 2016-10-19 ENCOUNTER — Non-Acute Institutional Stay (SKILLED_NURSING_FACILITY): Payer: Medicare Other | Admitting: Internal Medicine

## 2016-10-19 DIAGNOSIS — Z515 Encounter for palliative care: Secondary | ICD-10-CM | POA: Diagnosis not present

## 2016-10-19 NOTE — Progress Notes (Signed)
This is an acute visit.  Level care skilled.  Facility [facility].  Chief complaint-acute visit secondary to end of life care.  History of present illness.  Patient is an 82 year old male seen today for decline in status-and to address end-of-life issues.  He has a gradually deteriorating clinical status we did do labs earlier this week which showed an elevated sodium of 150 he received IV fluids but despite this his sodium actually has increased up to 156.  Dr. Chales AbrahamsGupta discussed options with his family at bedside yesterday and they have opted for essentially hospice care with no aggressive measures.  He does have a history of significant dementia which has progressed along with multiple medical issues including atrial fibrillation history of syncopal episodes history CVA and possible seizures.  It is thought possibly he may have had another stroke although this is difficult to tell.  He still is not eating and drinking well at all but again the emphasis is on comfort.  He has been started on morphine 5 mg every 2 hours when necessary's also on Ativan 0.5 mg every 3 hours when necessary.  He does have numerous other medications but have been asked to discontinue this since patient is not really capable of taking them anymore and this sounds quite reasonable.  Currently he does not appear to be in any distress he is quite lethargic but he does make eye contact he will open his eyes and acknowledged you in fact when I left the room he did say goodbye  Past Medical History:  Diagnosis Date  . Arthritis    "probably"  . Chronic kidney disease    "? stage" (08/18/2014)  . Chronic kidney disease   . Dementia    "don't know stage or type" (08/18/2014)  . Elevated cholesterol   . Hypertension   . PAF (paroxysmal atrial fibrillation) (HCC) 07/15/2016  . Seizures (HCC)   . Stroke (HCC)   . Type II diabetes mellitus (HCC)         Past Surgical History:  Procedure  Laterality Date  . CATARACT EXTRACTION    . CORONARY ARTERY BYPASS GRAFT  ?1994   "CABG X3"  . TONSILLECTOMY      No Known Allergies        Current Outpatient Prescriptions on File Prior to Visit  Medication Sig Dispense Refill  . acetaminophen (TYLENOL) 325 MG tablet Take 650 mg by mouth every 6 (six) hours as needed.    [name]. amLODipine (NORVASC) 10 MG tablet Take 10 mg by mouth daily.    Lucilla Lame. Balsam [place]-Castor Oil (VENELEX) OINT Apply to sacrum and bilateral buttocks q shift for prevention every shift    . Cholecalciferol (VITAMIN D-3) 1000 UNITS CAPS Take 1,000 Units by mouth daily.    . ferrous sulfate (KP FERROUS SULFATE) 325 (65 FE) MG tablet Take 325 mg by mouth daily with breakfast.    . folic acid (FOLVITE) 1 MG tablet Take 1 mg by mouth daily.    . insulin lispro (HUMALOG KWIKPEN) 100 UNIT/ML KiwkPen Give per sliding scale three times a day    . levETIRAcetam (KEPPRA) 100 MG/ML solution Give 5 ml by mouth twice a day    . levothyroxine (SYNTHROID, LEVOTHROID) 25 MCG tablet Take 2 tablets (50 mcg total) by mouth daily before breakfast. 60 tablet 1  . lisinopril (PRINIVIL,ZESTRIL) 40 MG tablet Take 40 mg by mouth daily.    . pantoprazole (PROTONIX) 40 MG tablet Take 1 tablet (40 mg  total) by mouth daily.    . potassium chloride SA (K-DUR,KLOR-CON) 20 MEQ tablet Take 30 mEq by mouth daily.      No current facility-administered medications on file prior to visit.      Review of Systems  Unable to perform ROS: Dementia        Immunization History  Administered Date(s) Administered  . Influenza-Unspecified 05/29/2014, 07/31/2016  . PPD Test 11/01/2013, 10/18/2014  . Pneumococcal-Unspecified 11/22/1998, 08/07/2016       Pertinent  Health Maintenance Due  Topic Date Due  . FOOT EXAM  10/13/2016  . HEMOGLOBIN A1C  03/12/2017  . URINE MICROALBUMIN  05/29/2017  . OPHTHALMOLOGY EXAM  08/07/2017  . PNA vac Low Risk Adult (2 of 2 - PCV13)  08/07/2017  . INFLUENZA VACCINE  Completed   No flowsheet data found. Functional Status Survey:    Temperature is 98.9 pulse 72 respirations 22 blood pressure 101/70 O2 saturation 92% There is no height or weight on file to calculate BMI. Physical Exam   In general this is a frail elderly male is not in any distress he's lying comfortably in bed he is responsive but very weak appearing.  HENT: Mucous membranes appear dry Head: Normocephalic.   Eyes: Pupils are equal, round, and reactive to light.  Cardiovascular: Normal rate and normal heart sounds.  An irregularly irregular rhythm present.  No murmur heard. Pulmonary/Chest: Effort normal and breath sounds normal. No respiratory distress. He has no wheezes. He has no rales.  Abdominal: Soft. Bowel sounds are normal. He exhibits no distension. There is no tenderness. There is no rebound.  Musculoskeletal: He exhibits no edema except on left arm status post humerus fracture.  Lymphadenopathy:    He has no cervical adenopathy.  Neurological: He is alert but very weak appearing.  Respond to his name but do not follow any commands-When I said goodbye he stated goodbye .    Labs reviewed: 10/18/2016.  Sodium 156 potassium 3.3 BUN 30 creatinine 0.98   Recent Labs (within last 365 days)   Recent Labs  09/13/16 0500  09/27/16 0500 10/05/16 0700 10/16/16 1459  NA 141  < > 138 141 150*  K 3.8  < > 3.6 3.6 3.6  CL 103  < > 102 105 113*  CO2 29  < > 30 30 26   GLUCOSE 171*  < > 155* 46* 273*  BUN 13  < > 16 21* 35*  CREATININE 0.73  < > 0.70 0.74 0.96  CALCIUM 8.9  < > 9.0 9.5 9.4  MG 1.7  --   --   --   --   < > = values in this interval not displayed.    Recent Labs (within last 365 days)   Recent Labs  07/14/16 1901 07/15/16 0639 10/16/16 1459  AST 22 15 15   ALT 18 15* 11*  ALKPHOS 123 120 73  BILITOT 0.5 0.6 0.9  PROT 6.4* 5.8* 7.3  ALBUMIN 2.9* 2.5* 2.6*      Recent Labs (within last 365 days)     Recent Labs  08/02/16 0700 09/12/16 0500 10/05/16 0700 10/16/16 1459  WBC 5.5 6.8 7.3 7.7  NEUTROABS 3.2 4.5  --  6.0  HGB 9.7* 9.0* 9.5* 10.0*  HCT 30.2* 27.7* 30.0* 33.2*  MCV 93.5 84.7 85.2 87.8  PLT 262 417* 299 463*     Recent Labs       Lab Results  Component Value Date   TSH 3.270 08/13/2016  Recent Labs       Lab Results  Component Value Date   HGBA1C 7.5 (H) 09/12/2016     Recent Labs       Lab Results  Component Value Date   CHOL 189 12/23/2014   HDL 44 12/23/2014   LDLCALC 125 (H) 12/23/2014   TRIG 101 12/23/2014   CHOLHDL 4.3 12/23/2014      Significant Diagnostic Results in last 30 days:   Imaging Results  [place] Arterial Seg Single  Result Date: 10/15/2016 CLINICAL DATA:  Nonhealing left heel wound. Dementia, previous stroke, left humerus fracture. EXAM: NONINVASIVE PHYSIOLOGIC VASCULAR STUDY OF BILATERAL LOWER EXTREMITIES TECHNIQUE: Evaluation of both lower extremities were performed at rest, including calculation of ankle-brachial indices with single level Doppler, pressure recording. COMPARISON:  None. FINDINGS: Right ABI:  1.02 Left ABI:  1.30 Right Lower Extremity: Irregular distal waveforms limiting interpretation. Left Lower Extremity:  Triphasic waveforms distally. IMPRESSION: 1. No evidence of hemodynamically significant lower extremity arterial occlusive disease at rest. Electronically Signed   By: Corlis Leak  Hassell M.D.   On: 10/15/2016 14:47     Assessment and plan.  #1 end-of-life issues with declining clinical status-he appears currently comfortable he is followed by hospice he is receiving morphine 5 mg every 2 hours as needed for pain this appears to be effective.  He is also on Ativan when necessary 0.5 mg every 3 hours this appears to be effective as well.  We'll discontinue all other meds since he is incapable taking them-again comfort is of the essence here and this appears to be the case currently.  Continue  supportive care.  [id number]

## 2016-10-24 ENCOUNTER — Encounter: Payer: Self-pay | Admitting: Internal Medicine

## 2016-10-29 DEATH — deceased

## 2016-11-01 ENCOUNTER — Other Ambulatory Visit: Payer: Self-pay

## 2016-11-08 ENCOUNTER — Ambulatory Visit: Payer: Medicare Other | Admitting: Orthopaedic Surgery

## 2017-03-12 IMAGING — CT CT HEAD W/O CM
4 series · 16 of 47 positions shown, 18 images · non-contrast
Comparison: 06/28/2016

CLINICAL DATA: [facility] staff reports pt was discharged today
from [REDACTED] and when he arrived to [facility] they noticed he had
swelling, bruising, and blisters to left arm. They did an xray and
found that pt has a humerus fracture.

EXAM:
CT HEAD WITHOUT CONTRAST
TECHNIQUE: Contiguous axial images were obtained from the base of the skull
through the vertex without intravenous contrast.

[Series 2: head w/o · axial · non-contrast · 0.46mm/px · z∈[+132,+244]mm · 8 of 38 slices shown, 10 images]
[im 5/38  brain]
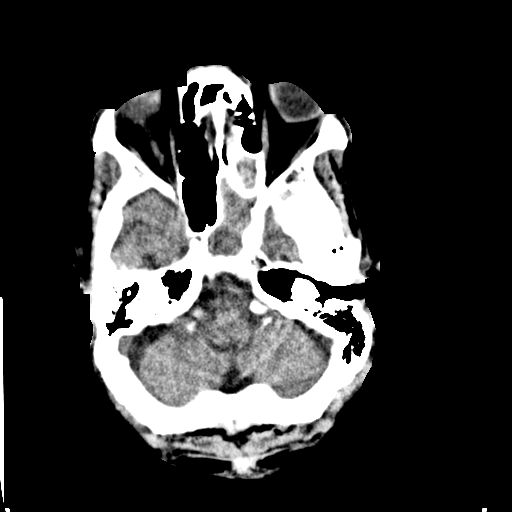
[im 5/38  bone]
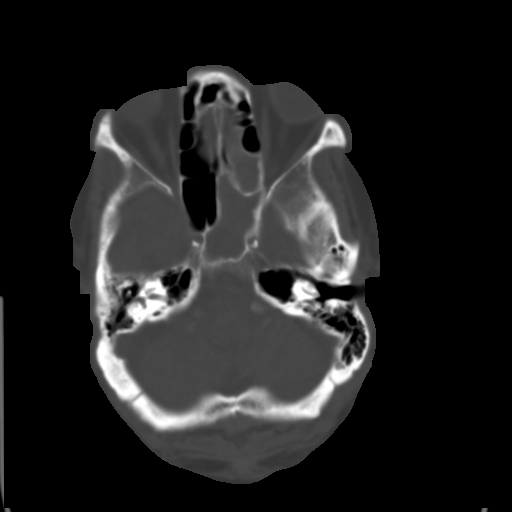
[im 9/38  brain]
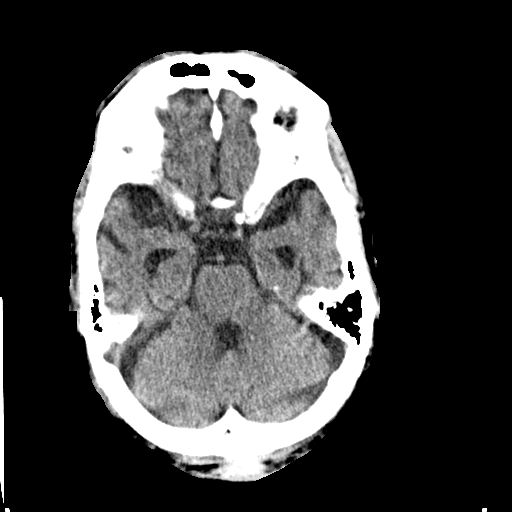
[im 13/38  brain]
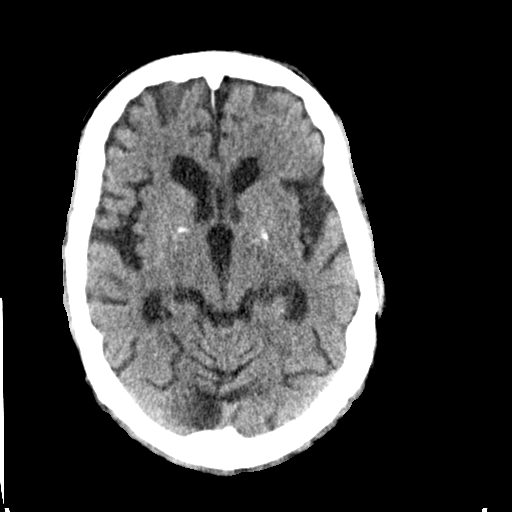
[im 17/38  brain]
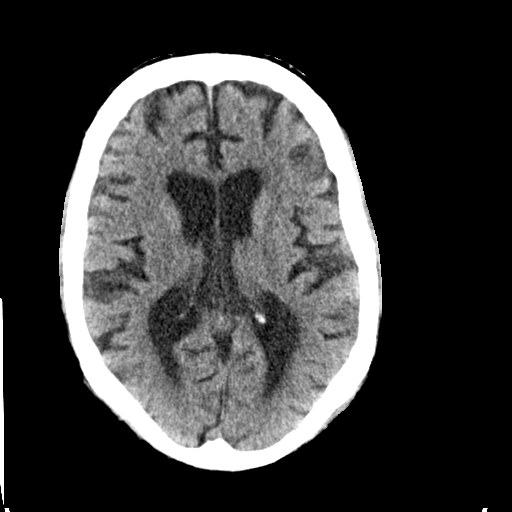
[im 21/38  brain]
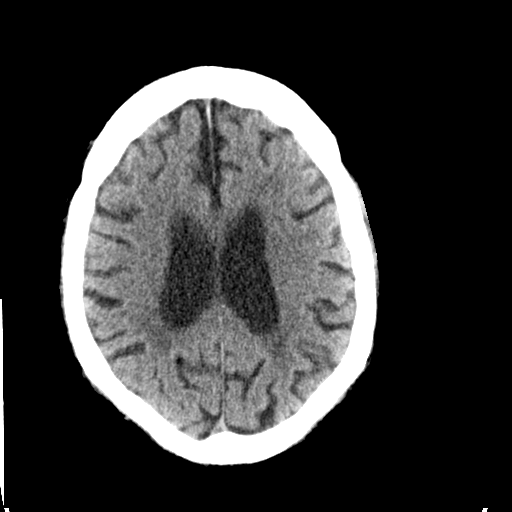
[im 21/38  bone]
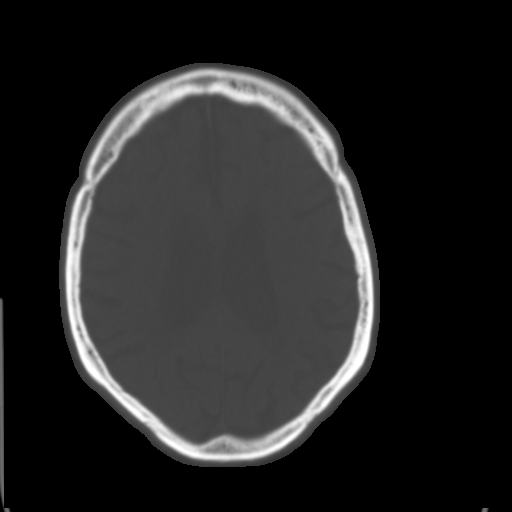
[im 25/38  brain]
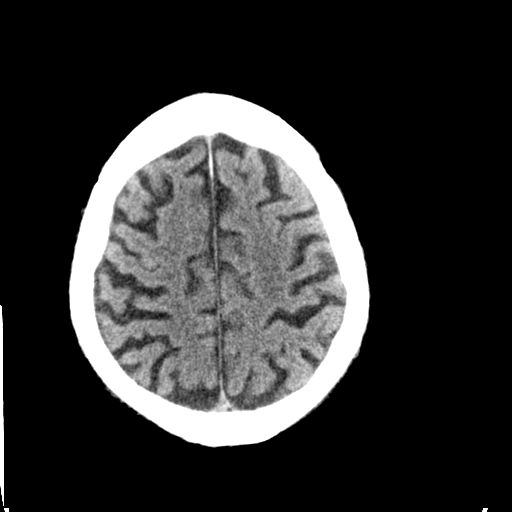
[im 29/38  brain]
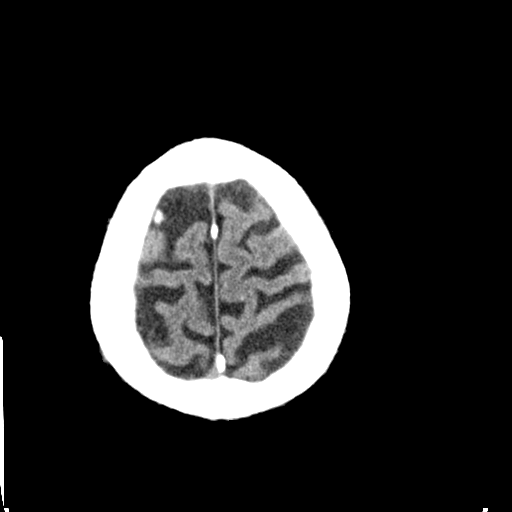
[im 33/38  brain]
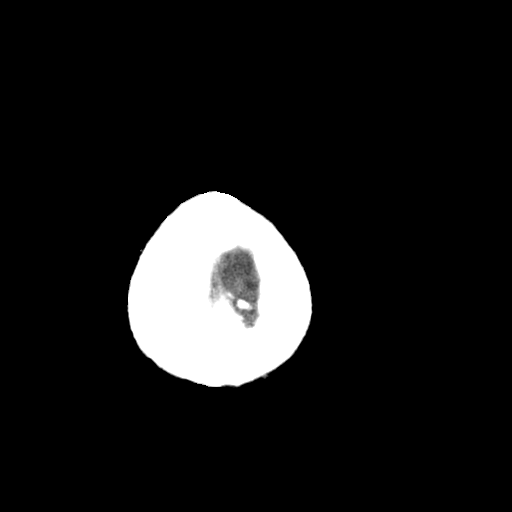

[Series 3: head bone · axial · 0.46mm/px · z∈[+130,+146]mm · 2 of 76 slices shown]
[im 8/76  bone]
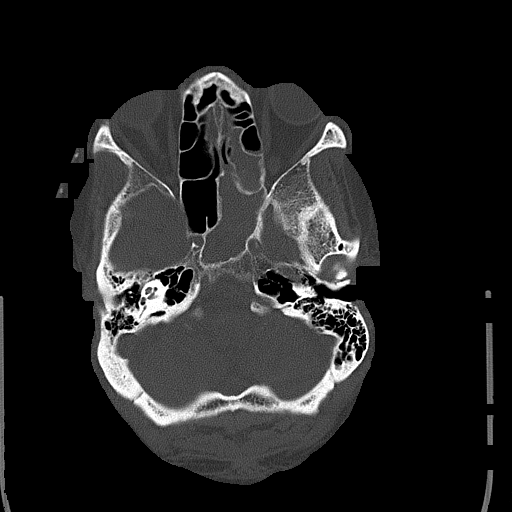
[im 16/76  bone]
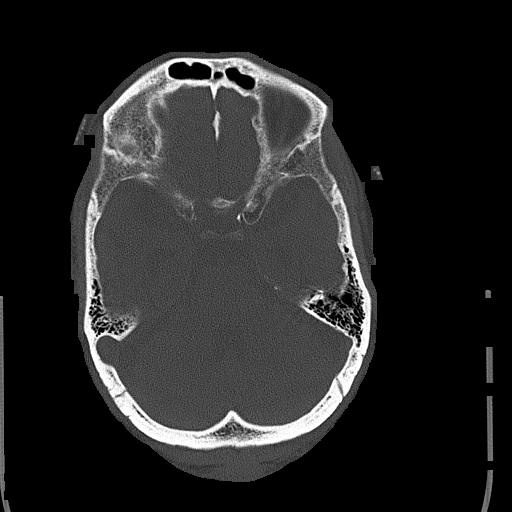

[Series 4: coronal · coronal · 0.34mm/px · 3 of 73 slices shown]
[im 25/73  brain]
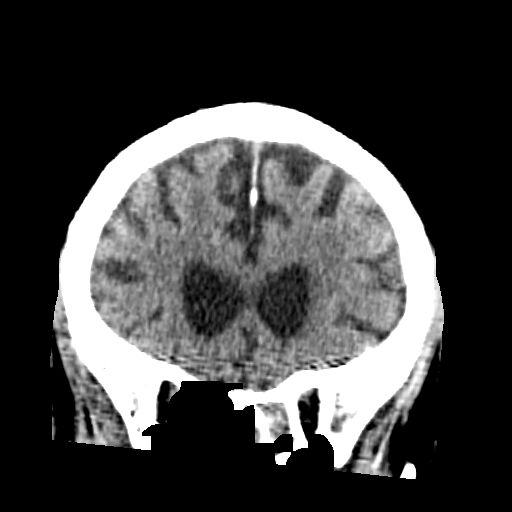
[im 33/73  brain]
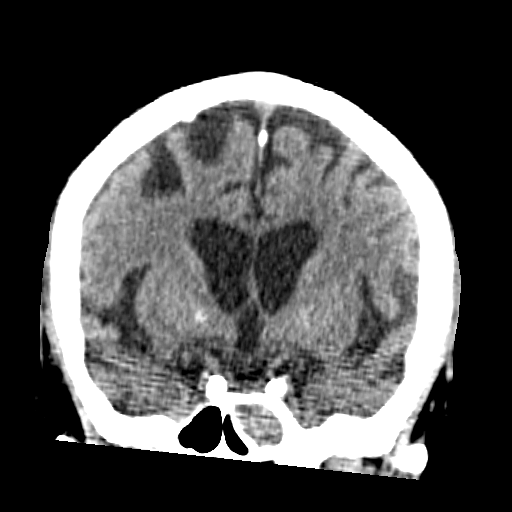
[im 41/73  brain]
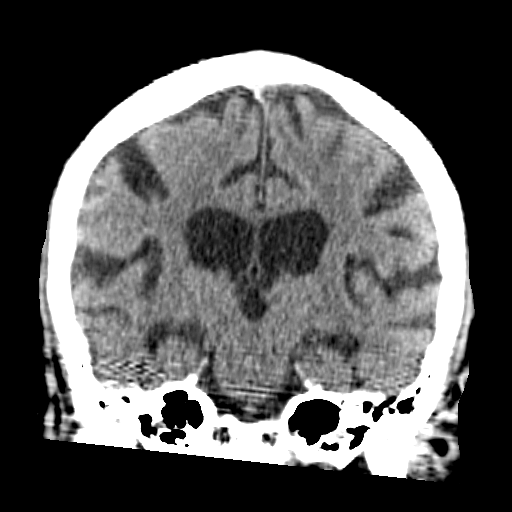

[Series 5: sagittal · sagittal · 0.33mm/px · 3 of 66 slices shown]
[im 22/66  brain]
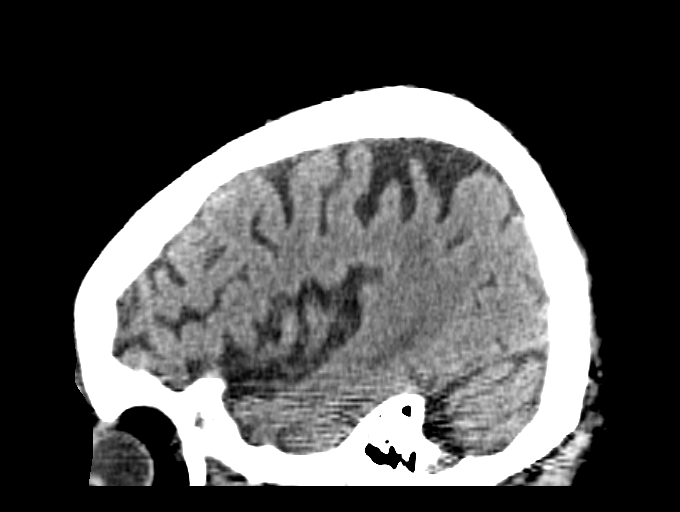
[im 33/66  brain]
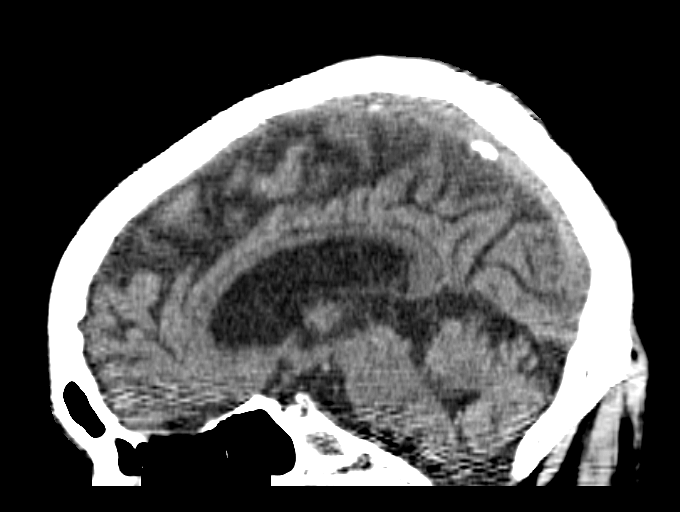
[im 44/66  brain]
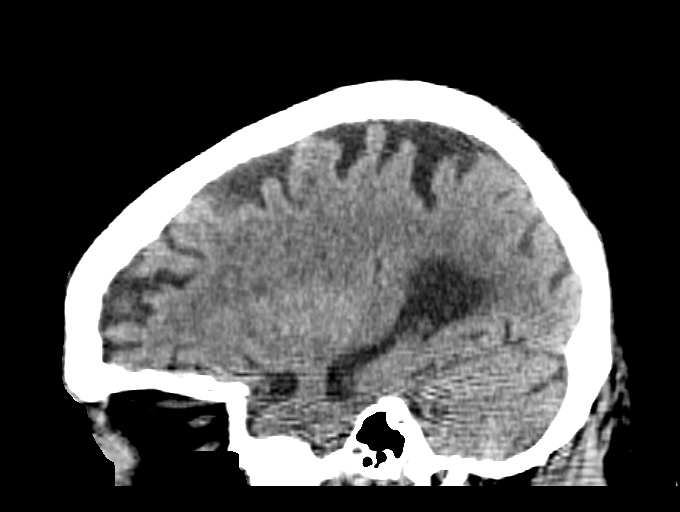

[16 of 47 positions shown; findings below may reference images not displayed]

FINDINGS: Brain: There is significant central and cortical atrophy.
Periventricular white matter changes are consistent with small
vessel disease. An extra-axial calcified mass is identified adjacent
to the right frontal bone measuring 8 mm and stable in appearance
compared with prior study. There is no associated edema on,
mass-effect, or hemorrhage.

Vascular: Significant calcification of the internal carotid
arteries.

Skull: No calvarial fracture.

Sinuses/Orbits: Significant opacification of the left sphenoid air
cell and multiple bilateral ethmoid air cells. Mastoid air cells are
normally aerated.

Other: None
IMPRESSION: 1. Atrophy and small vessel disease.
2.  No evidence for acute  abnormality.
3. Stable right frontal calcified mass measuring 8 mm. This may
represent a small meningioma or meningeal calcification and appears
stable.

## 2017-03-12 IMAGING — CR DG PELVIS 1-2V
1 series · 1 of 1 positions shown · non-contrast
Comparison: 02/15/2016

CLINICAL DATA: Fall

EXAM:
PELVIS - 1-2 VIEW

[ap]
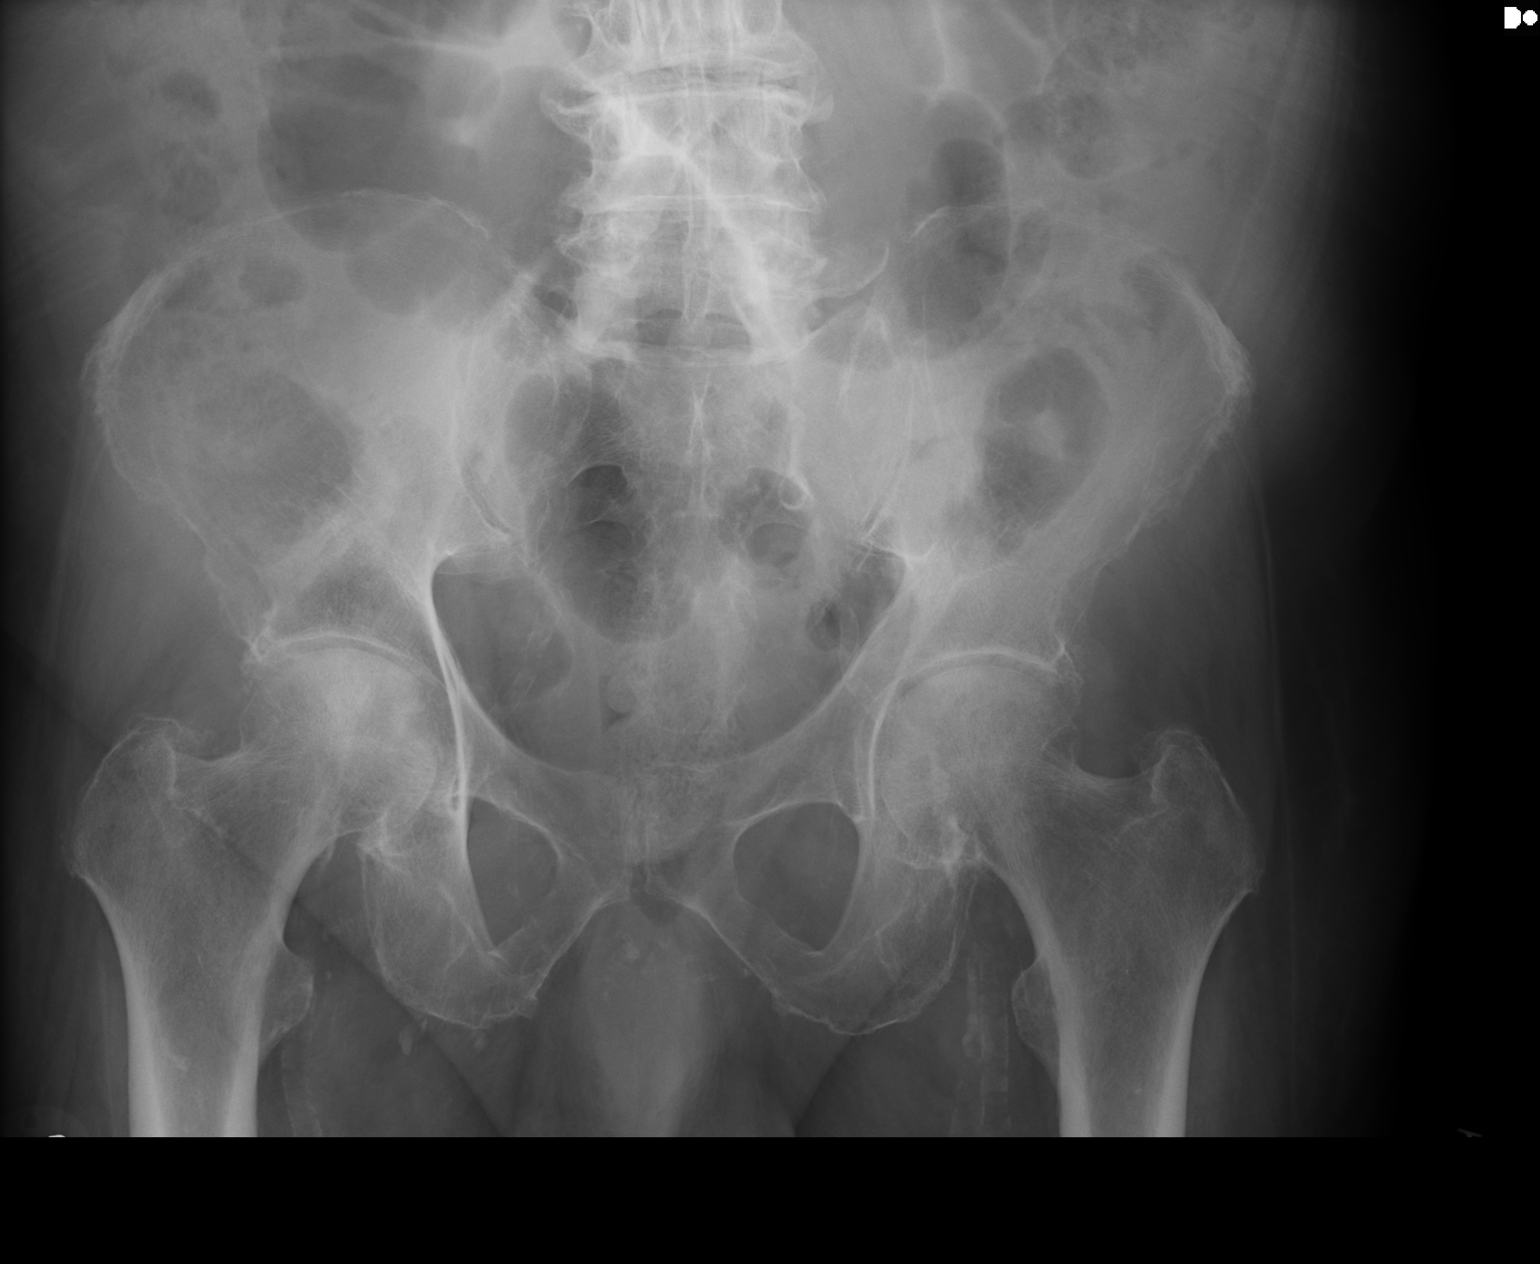

[1 of 1 positions shown; findings below may reference images not displayed]

FINDINGS: Diffuse osseous demineralization.

BILATERAL hip joint space narrowing slightly greater on RIGHT.

SI joints preserved.

Degenerative disc disease changes at lower lumbar spine.

No definite acute fracture, dislocation, or bone destruction.

Scattered atherosclerotic calcifications.
IMPRESSION: Osseous demineralization with mild degenerative changes of both hips
and more advanced degenerative changes at the visualized lower
lumbar spine.

No acute osseous abnormalities.
